# Patient Record
Sex: Female | Born: 1945
Health system: Southern US, Community
[De-identification: ages and names within clinical notes are randomized; demographics above are authoritative.]

## PROBLEM LIST (undated history)

## (undated) DIAGNOSIS — Z85828 Personal history of other malignant neoplasm of skin: Secondary | ICD-10-CM

## (undated) DIAGNOSIS — R5381 Other malaise: Secondary | ICD-10-CM

## (undated) DIAGNOSIS — IMO0001 Reserved for inherently not codable concepts without codable children: Secondary | ICD-10-CM

## (undated) DIAGNOSIS — M899 Disorder of bone, unspecified: Secondary | ICD-10-CM

## (undated) DIAGNOSIS — M810 Age-related osteoporosis without current pathological fracture: Secondary | ICD-10-CM

## (undated) DIAGNOSIS — D649 Anemia, unspecified: Secondary | ICD-10-CM

## (undated) DIAGNOSIS — G901 Familial dysautonomia [Riley-Day]: Secondary | ICD-10-CM

## (undated) DIAGNOSIS — G479 Sleep disorder, unspecified: Secondary | ICD-10-CM

## (undated) DIAGNOSIS — M79609 Pain in unspecified limb: Secondary | ICD-10-CM

## (undated) DIAGNOSIS — C541 Malignant neoplasm of endometrium: Secondary | ICD-10-CM

## (undated) DIAGNOSIS — R51 Headache: Secondary | ICD-10-CM

## (undated) DIAGNOSIS — Z8619 Personal history of other infectious and parasitic diseases: Secondary | ICD-10-CM

## (undated) DIAGNOSIS — L821 Other seborrheic keratosis: Secondary | ICD-10-CM

## (undated) DIAGNOSIS — M949 Disorder of cartilage, unspecified: Secondary | ICD-10-CM

## (undated) DIAGNOSIS — R5383 Other fatigue: Secondary | ICD-10-CM

## (undated) DIAGNOSIS — R4589 Other symptoms and signs involving emotional state: Secondary | ICD-10-CM

## (undated) DIAGNOSIS — IMO0002 Reserved for concepts with insufficient information to code with codable children: Secondary | ICD-10-CM

## (undated) DIAGNOSIS — L408 Other psoriasis: Secondary | ICD-10-CM

## (undated) DIAGNOSIS — K649 Unspecified hemorrhoids: Secondary | ICD-10-CM

## (undated) DIAGNOSIS — L509 Urticaria, unspecified: Secondary | ICD-10-CM

## (undated) DIAGNOSIS — F419 Anxiety disorder, unspecified: Secondary | ICD-10-CM

## (undated) DIAGNOSIS — N6019 Diffuse cystic mastopathy of unspecified breast: Secondary | ICD-10-CM

## (undated) HISTORY — DX: Familial dysautonomia (riley-day): G90.1

## (undated) HISTORY — DX: Other malaise: R53.81

## (undated) HISTORY — PX: KNEE SURGERY: SHX244

## (undated) HISTORY — DX: Disorder of bone, unspecified: M94.9

## (undated) HISTORY — DX: Urticaria, unspecified: L50.9

## (undated) HISTORY — PX: TONSILECTOMY, ADENOIDECTOMY, BILATERAL MYRINGOTOMY AND TUBES: SHX2538

## (undated) HISTORY — DX: Headache: R51

## (undated) HISTORY — PX: FINGER SURGERY: SHX640

## (undated) HISTORY — DX: Unspecified hemorrhoids: K64.9

## (undated) HISTORY — PX: AXILLARY LYMPH NODE DISSECTION: SHX5229

## (undated) HISTORY — DX: Other seborrheic keratosis: L82.1

## (undated) HISTORY — DX: Personal history of other malignant neoplasm of skin: Z85.828

## (undated) HISTORY — PX: TUBAL LIGATION: SHX77

## (undated) HISTORY — DX: Other symptoms and signs involving emotional state: R45.89

## (undated) HISTORY — DX: Personal history of other infectious and parasitic diseases: Z86.19

## (undated) HISTORY — DX: Other psoriasis: L40.8

## (undated) HISTORY — DX: Disorder of bone, unspecified: M89.9

## (undated) HISTORY — DX: Pain in unspecified limb: M79.609

## (undated) HISTORY — PX: OTHER SURGICAL HISTORY: SHX169

## (undated) HISTORY — DX: Reserved for inherently not codable concepts without codable children: IMO0001

## (undated) HISTORY — PX: CERVICAL CONIZATION W/BX: SHX1330

## (undated) HISTORY — PX: BREAST LUMPECTOMY: SHX2

## (undated) HISTORY — DX: Diffuse cystic mastopathy of unspecified breast: N60.19

## (undated) HISTORY — PX: BREAST EXCISIONAL BIOPSY: SUR124

## (undated) HISTORY — DX: Anxiety disorder, unspecified: F41.9

## (undated) HISTORY — DX: Anemia, unspecified: D64.9

## (undated) HISTORY — DX: Sleep disorder, unspecified: G47.9

## (undated) HISTORY — DX: Reserved for concepts with insufficient information to code with codable children: IMO0002

## (undated) HISTORY — DX: Age-related osteoporosis without current pathological fracture: M81.0

## (undated) HISTORY — DX: Other fatigue: R53.83

---

## 1971-07-25 HISTORY — PX: OTHER SURGICAL HISTORY: SHX169

## 1999-07-25 HISTORY — PX: BREAST BIOPSY: SHX20

## 2002-01-15 ENCOUNTER — Encounter: Payer: Self-pay | Admitting: Family Medicine

## 2002-01-15 ENCOUNTER — Encounter: Admission: RE | Admit: 2002-01-15 | Discharge: 2002-01-15 | Payer: Self-pay | Admitting: Family Medicine

## 2002-03-04 ENCOUNTER — Other Ambulatory Visit: Admission: RE | Admit: 2002-03-04 | Discharge: 2002-03-04 | Payer: Self-pay | Admitting: Family Medicine

## 2002-03-24 HISTORY — PX: OTHER SURGICAL HISTORY: SHX169

## 2003-01-20 ENCOUNTER — Encounter: Payer: Self-pay | Admitting: Family Medicine

## 2003-01-20 ENCOUNTER — Encounter: Admission: RE | Admit: 2003-01-20 | Discharge: 2003-01-20 | Payer: Self-pay | Admitting: Family Medicine

## 2003-04-07 ENCOUNTER — Other Ambulatory Visit: Admission: RE | Admit: 2003-04-07 | Discharge: 2003-04-07 | Payer: Self-pay | Admitting: Family Medicine

## 2003-06-24 HISTORY — PX: COLONOSCOPY: SHX174

## 2003-06-25 ENCOUNTER — Encounter: Payer: Self-pay | Admitting: Gastroenterology

## 2003-06-25 LAB — HM COLONOSCOPY

## 2004-02-04 ENCOUNTER — Encounter: Admission: RE | Admit: 2004-02-04 | Discharge: 2004-02-04 | Payer: Self-pay | Admitting: Family Medicine

## 2004-05-13 ENCOUNTER — Other Ambulatory Visit: Admission: RE | Admit: 2004-05-13 | Discharge: 2004-05-13 | Payer: Self-pay | Admitting: Family Medicine

## 2004-06-30 ENCOUNTER — Ambulatory Visit: Payer: Self-pay | Admitting: Family Medicine

## 2004-07-24 HISTORY — PX: OTHER SURGICAL HISTORY: SHX169

## 2004-08-19 ENCOUNTER — Ambulatory Visit: Payer: Self-pay | Admitting: Family Medicine

## 2005-01-04 ENCOUNTER — Ambulatory Visit: Payer: Self-pay | Admitting: Family Medicine

## 2005-02-07 ENCOUNTER — Encounter: Admission: RE | Admit: 2005-02-07 | Discharge: 2005-02-07 | Payer: Self-pay | Admitting: Family Medicine

## 2005-02-13 ENCOUNTER — Encounter: Admission: RE | Admit: 2005-02-13 | Discharge: 2005-02-13 | Payer: Self-pay | Admitting: Family Medicine

## 2005-05-16 ENCOUNTER — Ambulatory Visit: Payer: Self-pay | Admitting: Family Medicine

## 2005-05-16 ENCOUNTER — Other Ambulatory Visit: Admission: RE | Admit: 2005-05-16 | Discharge: 2005-05-16 | Payer: Self-pay | Admitting: Family Medicine

## 2005-05-24 ENCOUNTER — Ambulatory Visit: Payer: Self-pay | Admitting: Family Medicine

## 2005-06-14 ENCOUNTER — Ambulatory Visit: Payer: Self-pay | Admitting: Family Medicine

## 2005-07-16 ENCOUNTER — Encounter: Payer: Self-pay | Admitting: Family Medicine

## 2005-07-16 LAB — CONVERTED CEMR LAB: Pap Smear: NORMAL

## 2005-12-24 ENCOUNTER — Emergency Department (HOSPITAL_COMMUNITY): Admission: EM | Admit: 2005-12-24 | Discharge: 2005-12-24 | Payer: Self-pay | Admitting: Emergency Medicine

## 2005-12-31 ENCOUNTER — Emergency Department (HOSPITAL_COMMUNITY): Admission: EM | Admit: 2005-12-31 | Discharge: 2005-12-31 | Payer: Self-pay | Admitting: Emergency Medicine

## 2006-03-07 ENCOUNTER — Encounter: Admission: RE | Admit: 2006-03-07 | Discharge: 2006-03-07 | Payer: Self-pay | Admitting: Family Medicine

## 2006-05-02 ENCOUNTER — Ambulatory Visit: Payer: Self-pay | Admitting: Family Medicine

## 2006-05-02 ENCOUNTER — Ambulatory Visit: Payer: Self-pay | Admitting: Internal Medicine

## 2006-07-16 ENCOUNTER — Ambulatory Visit: Payer: Self-pay | Admitting: Family Medicine

## 2006-07-16 ENCOUNTER — Other Ambulatory Visit: Admission: RE | Admit: 2006-07-16 | Discharge: 2006-07-16 | Payer: Self-pay | Admitting: Family Medicine

## 2006-07-16 ENCOUNTER — Encounter: Payer: Self-pay | Admitting: Family Medicine

## 2006-07-16 LAB — CONVERTED CEMR LAB
Pap Smear: NORMAL
Pap Smear: NORMAL

## 2006-08-03 ENCOUNTER — Ambulatory Visit: Payer: Self-pay | Admitting: Gastroenterology

## 2006-08-21 ENCOUNTER — Ambulatory Visit: Payer: Self-pay | Admitting: Family Medicine

## 2006-08-21 LAB — CONVERTED CEMR LAB
ALT: 19 units/L (ref 0–40)
AST: 24 units/L (ref 0–37)
Albumin: 3.9 g/dL (ref 3.5–5.2)
BUN: 19 mg/dL (ref 6–23)
Basophils Absolute: 0 10*3/uL (ref 0.0–0.1)
Basophils Relative: 0.6 % (ref 0.0–1.0)
CO2: 28 meq/L (ref 19–32)
Calcium: 9.1 mg/dL (ref 8.4–10.5)
Chloride: 108 meq/L (ref 96–112)
Cholesterol: 185 mg/dL (ref 0–200)
Creatinine, Ser: 0.7 mg/dL (ref 0.4–1.2)
Eosinophils Absolute: 0.1 10*3/uL (ref 0.0–0.6)
Eosinophils Relative: 2.1 % (ref 0.0–5.0)
GFR calc Af Amer: 109 mL/min
GFR calc non Af Amer: 90 mL/min
Glucose, Bld: 91 mg/dL (ref 70–99)
HCT: 36.1 % (ref 36.0–46.0)
HDL: 66.2 mg/dL (ref >39.0)
Hemoglobin: 12.3 g/dL (ref 12.0–15.0)
LDL Cholesterol: 106 mg/dL — ABNORMAL HIGH (ref 0–99)
Lymphocytes Relative: 25.8 % (ref 12.0–46.0)
MCHC: 34 g/dL (ref 30.0–36.0)
MCV: 97.8 fL (ref 78.0–100.0)
Monocytes Absolute: 0.4 10*3/uL (ref 0.2–0.7)
Monocytes Relative: 9.3 % (ref 3.0–11.0)
Neutro Abs: 2.8 10*3/uL (ref 1.4–7.7)
Neutrophils Relative %: 62.2 % (ref 43.0–77.0)
Phosphorus: 3.6 mg/dL (ref 2.3–4.6)
Platelets: 156 10*3/uL (ref 150–400)
Potassium: 4.1 meq/L (ref 3.5–5.1)
RBC: 3.69 M/uL — ABNORMAL LOW (ref 3.87–5.11)
RDW: 11.9 % (ref 11.5–14.6)
Sodium: 141 meq/L (ref 135–145)
TSH: 1.57 microintl units/mL (ref 0.35–5.50)
Total CHOL/HDL Ratio: 2.8
Triglycerides: 63 mg/dL (ref 0–149)
VLDL: 13 mg/dL (ref 0–40)
WBC: 4.5 10*3/uL (ref 4.5–10.5)

## 2006-09-21 ENCOUNTER — Ambulatory Visit: Payer: Self-pay | Admitting: Family Medicine

## 2006-09-21 LAB — CONVERTED CEMR LAB
Basophils Absolute: 0 10*3/uL (ref 0.0–0.1)
Basophils Relative: 1 % (ref 0–1)
Eosinophils Absolute: 0.1 10*3/uL (ref 0.0–0.7)
Eosinophils Relative: 2 % (ref 0–5)
Folate: >20 ng/mL
Free T4: 1.25 ng/dL (ref 0.89–1.80)
Glucose, Bld: 88 mg/dL (ref 70–99)
HCT: 38.1 % (ref 36.0–46.0)
Hemoglobin: 12.5 g/dL (ref 12.0–15.0)
Lymphocytes Relative: 27 % (ref 12–46)
Lymphs Abs: 1.6 10*3/uL (ref 0.7–3.3)
MCHC: 32.8 g/dL (ref 30.0–36.0)
MCV: 97.4 fL (ref 78.0–100.0)
Monocytes Absolute: 0.6 10*3/uL (ref 0.2–0.7)
Monocytes Relative: 10 % (ref 3–11)
Neutro Abs: 3.5 10*3/uL (ref 1.7–7.7)
Neutrophils Relative %: 61 % (ref 43–77)
Platelets: 181 10*3/uL (ref 150–400)
RBC: 3.91 M/uL (ref 3.87–5.11)
RDW: 12.7 % (ref 11.5–14.0)
TSH: 2.028 microintl units/mL (ref 0.350–5.50)
Vit D, 1,25-Dihydroxy: 43 (ref 20–57)
Vitamin B-12: 1978 pg/mL — ABNORMAL HIGH (ref 211–911)
WBC: 5.8 10*3/uL (ref 4.0–10.5)

## 2006-12-04 ENCOUNTER — Telehealth (INDEPENDENT_AMBULATORY_CARE_PROVIDER_SITE_OTHER): Payer: Self-pay | Admitting: *Deleted

## 2007-02-22 HISTORY — PX: OTHER SURGICAL HISTORY: SHX169

## 2007-02-27 ENCOUNTER — Ambulatory Visit: Payer: Self-pay | Admitting: Internal Medicine

## 2007-03-12 ENCOUNTER — Encounter: Admission: RE | Admit: 2007-03-12 | Discharge: 2007-03-12 | Payer: Self-pay | Admitting: Family Medicine

## 2007-03-15 ENCOUNTER — Encounter (INDEPENDENT_AMBULATORY_CARE_PROVIDER_SITE_OTHER): Payer: Self-pay | Admitting: *Deleted

## 2007-05-03 ENCOUNTER — Ambulatory Visit: Payer: Self-pay | Admitting: Family Medicine

## 2007-06-21 ENCOUNTER — Ambulatory Visit: Payer: Self-pay | Admitting: Family Medicine

## 2007-06-21 DIAGNOSIS — C44309 Unspecified malignant neoplasm of skin of other parts of face: Secondary | ICD-10-CM | POA: Insufficient documentation

## 2007-06-21 DIAGNOSIS — L408 Other psoriasis: Secondary | ICD-10-CM | POA: Insufficient documentation

## 2007-06-21 DIAGNOSIS — N6019 Diffuse cystic mastopathy of unspecified breast: Secondary | ICD-10-CM | POA: Insufficient documentation

## 2007-06-21 DIAGNOSIS — C44300 Unspecified malignant neoplasm of skin of unspecified part of face: Secondary | ICD-10-CM | POA: Insufficient documentation

## 2007-06-21 DIAGNOSIS — L509 Urticaria, unspecified: Secondary | ICD-10-CM | POA: Insufficient documentation

## 2007-07-19 ENCOUNTER — Telehealth: Payer: Self-pay | Admitting: Family Medicine

## 2007-09-04 ENCOUNTER — Ambulatory Visit: Payer: Self-pay | Admitting: Family Medicine

## 2007-10-08 ENCOUNTER — Ambulatory Visit: Payer: Self-pay | Admitting: Family Medicine

## 2007-10-08 DIAGNOSIS — M810 Age-related osteoporosis without current pathological fracture: Secondary | ICD-10-CM | POA: Insufficient documentation

## 2007-10-15 ENCOUNTER — Ambulatory Visit: Payer: Self-pay | Admitting: Family Medicine

## 2007-10-17 LAB — CONVERTED CEMR LAB
ALT: 16 units/L (ref 0–35)
AST: 25 units/L (ref 0–37)
Albumin: 3.5 g/dL (ref 3.5–5.2)
Alkaline Phosphatase: 44 units/L (ref 39–117)
BUN: 13 mg/dL (ref 6–23)
Basophils Absolute: 0 10*3/uL (ref 0.0–0.1)
Basophils Relative: 0.6 % (ref 0.0–1.0)
Bilirubin, Direct: 0.1 mg/dL (ref 0.0–0.3)
CO2: 29 meq/L (ref 19–32)
Calcium: 9.1 mg/dL (ref 8.4–10.5)
Chloride: 108 meq/L (ref 96–112)
Cholesterol: 185 mg/dL (ref 0–200)
Creatinine, Ser: 0.6 mg/dL (ref 0.4–1.2)
Eosinophils Absolute: 0.1 10*3/uL (ref 0.0–0.6)
Eosinophils Relative: 3.2 % (ref 0.0–5.0)
GFR calc Af Amer: 130 mL/min
GFR calc non Af Amer: 107 mL/min
Glucose, Bld: 87 mg/dL (ref 70–99)
HCT: 35.4 % — ABNORMAL LOW (ref 36.0–46.0)
HDL: 65.6 mg/dL (ref >39.0)
Hemoglobin: 11.7 g/dL — ABNORMAL LOW (ref 12.0–15.0)
LDL Cholesterol: 110 mg/dL — ABNORMAL HIGH (ref 0–99)
Lymphocytes Relative: 28.7 % (ref 12.0–46.0)
MCHC: 33 g/dL (ref 30.0–36.0)
MCV: 98.1 fL (ref 78.0–100.0)
Monocytes Absolute: 0.4 10*3/uL (ref 0.2–0.7)
Monocytes Relative: 9.9 % (ref 3.0–11.0)
Neutro Abs: 2.7 10*3/uL (ref 1.4–7.7)
Neutrophils Relative %: 57.6 % (ref 43.0–77.0)
Platelets: 161 10*3/uL (ref 150–400)
Potassium: 4.1 meq/L (ref 3.5–5.1)
RBC: 3.61 M/uL — ABNORMAL LOW (ref 3.87–5.11)
RDW: 12.7 % (ref 11.5–14.6)
Sodium: 141 meq/L (ref 135–145)
TSH: 1.77 microintl units/mL (ref 0.35–5.50)
Total Bilirubin: 0.7 mg/dL (ref 0.3–1.2)
Total CHOL/HDL Ratio: 2.8
Total Protein: 6.4 g/dL (ref 6.0–8.3)
Triglycerides: 48 mg/dL (ref 0–149)
VLDL: 10 mg/dL (ref 0–40)
WBC: 4.5 10*3/uL (ref 4.5–10.5)

## 2007-10-25 ENCOUNTER — Ambulatory Visit: Payer: Self-pay | Admitting: Family Medicine

## 2007-10-25 LAB — CONVERTED CEMR LAB
OCCULT 1: NEGATIVE
OCCULT 2: NEGATIVE
OCCULT 3: NEGATIVE

## 2007-10-28 LAB — FECAL OCCULT BLOOD, GUAIAC: Fecal Occult Blood: NEGATIVE

## 2008-03-16 ENCOUNTER — Encounter: Admission: RE | Admit: 2008-03-16 | Discharge: 2008-03-16 | Payer: Self-pay | Admitting: Family Medicine

## 2008-05-01 ENCOUNTER — Telehealth: Payer: Self-pay | Admitting: Family Medicine

## 2008-07-31 ENCOUNTER — Telehealth (INDEPENDENT_AMBULATORY_CARE_PROVIDER_SITE_OTHER): Payer: Self-pay | Admitting: *Deleted

## 2008-10-08 ENCOUNTER — Ambulatory Visit: Payer: Self-pay | Admitting: Family Medicine

## 2008-10-09 LAB — CONVERTED CEMR LAB
ALT: 17 units/L (ref 0–35)
AST: 22 units/L (ref 0–37)
Albumin: 3.5 g/dL (ref 3.5–5.2)
Alkaline Phosphatase: 54 units/L (ref 39–117)
BUN: 16 mg/dL (ref 6–23)
Basophils Absolute: 0 10*3/uL (ref 0.0–0.1)
Basophils Relative: 0.1 % (ref 0.0–3.0)
Bilirubin, Direct: 0 mg/dL (ref 0.0–0.3)
CO2: 30 meq/L (ref 19–32)
Calcium: 8.9 mg/dL (ref 8.4–10.5)
Chloride: 108 meq/L (ref 96–112)
Cholesterol: 186 mg/dL (ref 0–200)
Creatinine, Ser: 0.6 mg/dL (ref 0.4–1.2)
Eosinophils Absolute: 0.1 10*3/uL (ref 0.0–0.7)
Eosinophils Relative: 2.8 % (ref 0.0–5.0)
GFR calc non Af Amer: 106.74 mL/min (ref >60)
Glucose, Bld: 86 mg/dL (ref 70–99)
HCT: 34.4 % — ABNORMAL LOW (ref 36.0–46.0)
HDL: 67 mg/dL (ref >39.00)
Hemoglobin: 11.8 g/dL — ABNORMAL LOW (ref 12.0–15.0)
LDL Cholesterol: 108 mg/dL — ABNORMAL HIGH (ref 0–99)
Lymphocytes Relative: 27.7 % (ref 12.0–46.0)
Lymphs Abs: 1.3 10*3/uL (ref 0.7–4.0)
MCHC: 34.3 g/dL (ref 30.0–36.0)
MCV: 97.8 fL (ref 78.0–100.0)
Monocytes Absolute: 0.4 10*3/uL (ref 0.1–1.0)
Monocytes Relative: 9.2 % (ref 3.0–12.0)
Neutro Abs: 2.8 10*3/uL (ref 1.4–7.7)
Neutrophils Relative %: 60.2 % (ref 43.0–77.0)
Platelets: 133 10*3/uL — ABNORMAL LOW (ref 150.0–400.0)
Potassium: 3.5 meq/L (ref 3.5–5.1)
RBC: 3.51 M/uL — ABNORMAL LOW (ref 3.87–5.11)
RDW: 12.2 % (ref 11.5–14.6)
Sodium: 142 meq/L (ref 135–145)
TSH: 2.18 microintl units/mL (ref 0.35–5.50)
Total Bilirubin: 0.8 mg/dL (ref 0.3–1.2)
Total CHOL/HDL Ratio: 3
Total Protein: 6.4 g/dL (ref 6.0–8.3)
Triglycerides: 56 mg/dL (ref 0.0–149.0)
VLDL: 11.2 mg/dL (ref 0.0–40.0)
WBC: 4.6 10*3/uL (ref 4.5–10.5)

## 2008-10-12 ENCOUNTER — Ambulatory Visit: Payer: Self-pay | Admitting: Family Medicine

## 2008-10-12 ENCOUNTER — Other Ambulatory Visit: Admission: RE | Admit: 2008-10-12 | Discharge: 2008-10-12 | Payer: Self-pay | Admitting: Family Medicine

## 2008-10-12 ENCOUNTER — Encounter: Payer: Self-pay | Admitting: Family Medicine

## 2008-10-12 ENCOUNTER — Telehealth: Payer: Self-pay | Admitting: Gastroenterology

## 2008-10-12 DIAGNOSIS — K649 Unspecified hemorrhoids: Secondary | ICD-10-CM | POA: Insufficient documentation

## 2008-10-12 LAB — CONVERTED CEMR LAB: Vit D, 25-Hydroxy: 42 ng/mL (ref 30–89)

## 2008-10-15 ENCOUNTER — Ambulatory Visit: Payer: Self-pay | Admitting: Gastroenterology

## 2008-10-15 ENCOUNTER — Encounter (INDEPENDENT_AMBULATORY_CARE_PROVIDER_SITE_OTHER): Payer: Self-pay | Admitting: *Deleted

## 2008-11-02 ENCOUNTER — Telehealth (INDEPENDENT_AMBULATORY_CARE_PROVIDER_SITE_OTHER): Payer: Self-pay | Admitting: *Deleted

## 2008-12-09 ENCOUNTER — Ambulatory Visit: Payer: Self-pay | Admitting: Family Medicine

## 2008-12-09 DIAGNOSIS — G479 Sleep disorder, unspecified: Secondary | ICD-10-CM | POA: Insufficient documentation

## 2008-12-09 DIAGNOSIS — R4589 Other symptoms and signs involving emotional state: Secondary | ICD-10-CM | POA: Insufficient documentation

## 2009-03-24 ENCOUNTER — Encounter: Admission: RE | Admit: 2009-03-24 | Discharge: 2009-03-24 | Payer: Self-pay | Admitting: Family Medicine

## 2009-03-24 HISTORY — PX: OTHER SURGICAL HISTORY: SHX169

## 2009-03-26 ENCOUNTER — Encounter (INDEPENDENT_AMBULATORY_CARE_PROVIDER_SITE_OTHER): Payer: Self-pay | Admitting: *Deleted

## 2009-03-31 ENCOUNTER — Encounter: Payer: Self-pay | Admitting: Family Medicine

## 2009-03-31 ENCOUNTER — Ambulatory Visit: Payer: Self-pay | Admitting: Internal Medicine

## 2009-05-26 ENCOUNTER — Ambulatory Visit: Payer: Self-pay | Admitting: Family Medicine

## 2009-08-25 ENCOUNTER — Ambulatory Visit: Payer: Self-pay | Admitting: Family Medicine

## 2009-08-25 DIAGNOSIS — L821 Other seborrheic keratosis: Secondary | ICD-10-CM | POA: Insufficient documentation

## 2009-12-21 ENCOUNTER — Ambulatory Visit: Payer: Self-pay | Admitting: Family Medicine

## 2009-12-21 LAB — CONVERTED CEMR LAB
ALT: 17 units/L (ref 0–35)
AST: 22 units/L (ref 0–37)
Albumin: 3.8 g/dL (ref 3.5–5.2)
Alkaline Phosphatase: 54 units/L (ref 39–117)
BUN: 20 mg/dL (ref 6–23)
Basophils Absolute: 0 10*3/uL (ref 0.0–0.1)
Basophils Relative: 0.6 % (ref 0.0–3.0)
Bilirubin, Direct: 0.1 mg/dL (ref 0.0–0.3)
CO2: 30 meq/L (ref 19–32)
Calcium: 9 mg/dL (ref 8.4–10.5)
Chloride: 104 meq/L (ref 96–112)
Cholesterol: 185 mg/dL (ref 0–200)
Creatinine, Ser: 0.6 mg/dL (ref 0.4–1.2)
Eosinophils Absolute: 0.1 10*3/uL (ref 0.0–0.7)
Eosinophils Relative: 3.2 % (ref 0.0–5.0)
GFR calc non Af Amer: 104.33 mL/min (ref >60)
Glucose, Bld: 84 mg/dL (ref 70–99)
HCT: 34.8 % — ABNORMAL LOW (ref 36.0–46.0)
HDL: 66.5 mg/dL (ref >39.00)
Hemoglobin: 12 g/dL (ref 12.0–15.0)
LDL Cholesterol: 109 mg/dL — ABNORMAL HIGH (ref 0–99)
Lymphocytes Relative: 30.6 % (ref 12.0–46.0)
Lymphs Abs: 1.2 10*3/uL (ref 0.7–4.0)
MCHC: 34.6 g/dL (ref 30.0–36.0)
MCV: 98.7 fL (ref 78.0–100.0)
Monocytes Absolute: 0.4 10*3/uL (ref 0.1–1.0)
Monocytes Relative: 9.5 % (ref 3.0–12.0)
Neutro Abs: 2.2 10*3/uL (ref 1.4–7.7)
Neutrophils Relative %: 56.1 % (ref 43.0–77.0)
Platelets: 139 10*3/uL — ABNORMAL LOW (ref 150.0–400.0)
Potassium: 4.2 meq/L (ref 3.5–5.1)
RBC: 3.52 M/uL — ABNORMAL LOW (ref 3.87–5.11)
RDW: 12.8 % (ref 11.5–14.6)
Sodium: 141 meq/L (ref 135–145)
TSH: 1.9 microintl units/mL (ref 0.35–5.50)
Total Bilirubin: 0.5 mg/dL (ref 0.3–1.2)
Total CHOL/HDL Ratio: 3
Total Protein: 6.3 g/dL (ref 6.0–8.3)
Triglycerides: 50 mg/dL (ref 0.0–149.0)
VLDL: 10 mg/dL (ref 0.0–40.0)
WBC: 4 10*3/uL — ABNORMAL LOW (ref 4.5–10.5)

## 2009-12-22 LAB — CONVERTED CEMR LAB: Vit D, 25-Hydroxy: 45 ng/mL (ref 30–89)

## 2009-12-24 ENCOUNTER — Telehealth: Payer: Self-pay | Admitting: Family Medicine

## 2009-12-29 ENCOUNTER — Ambulatory Visit: Payer: Self-pay | Admitting: Family Medicine

## 2009-12-29 ENCOUNTER — Other Ambulatory Visit: Admission: RE | Admit: 2009-12-29 | Discharge: 2009-12-29 | Payer: Self-pay | Admitting: Family Medicine

## 2009-12-29 DIAGNOSIS — M79609 Pain in unspecified limb: Secondary | ICD-10-CM | POA: Insufficient documentation

## 2009-12-29 LAB — HM PAP SMEAR

## 2009-12-29 LAB — CONVERTED CEMR LAB: Pap Smear: NORMAL

## 2010-01-07 ENCOUNTER — Encounter: Payer: Self-pay | Admitting: Family Medicine

## 2010-01-07 LAB — CONVERTED CEMR LAB: Pap Smear: NEGATIVE

## 2010-02-14 ENCOUNTER — Telehealth: Payer: Self-pay | Admitting: Family Medicine

## 2010-02-17 ENCOUNTER — Ambulatory Visit: Payer: Self-pay | Admitting: Family Medicine

## 2010-02-17 DIAGNOSIS — R519 Headache, unspecified: Secondary | ICD-10-CM | POA: Insufficient documentation

## 2010-02-17 DIAGNOSIS — R5383 Other fatigue: Secondary | ICD-10-CM

## 2010-02-17 DIAGNOSIS — R51 Headache: Secondary | ICD-10-CM | POA: Insufficient documentation

## 2010-02-17 DIAGNOSIS — R5381 Other malaise: Secondary | ICD-10-CM | POA: Insufficient documentation

## 2010-02-18 ENCOUNTER — Telehealth: Payer: Self-pay | Admitting: Family Medicine

## 2010-02-18 LAB — CONVERTED CEMR LAB
BUN: 17 mg/dL (ref 6–23)
Basophils Absolute: 0 10*3/uL (ref 0.0–0.1)
Basophils Relative: 0.5 % (ref 0.0–3.0)
CO2: 30 meq/L (ref 19–32)
Calcium: 8.8 mg/dL (ref 8.4–10.5)
Chloride: 97 meq/L (ref 96–112)
Creatinine, Ser: 0.6 mg/dL (ref 0.4–1.2)
Eosinophils Absolute: 0.1 10*3/uL (ref 0.0–0.7)
Eosinophils Relative: 1.9 % (ref 0.0–5.0)
GFR calc non Af Amer: 115.1 mL/min (ref >60)
Glucose, Bld: 102 mg/dL — ABNORMAL HIGH (ref 70–99)
HCT: 34.7 % — ABNORMAL LOW (ref 36.0–46.0)
Hemoglobin: 12.3 g/dL (ref 12.0–15.0)
Lymphocytes Relative: 35.4 % (ref 12.0–46.0)
Lymphs Abs: 1.2 10*3/uL (ref 0.7–4.0)
MCHC: 35.3 g/dL (ref 30.0–36.0)
MCV: 97.3 fL (ref 78.0–100.0)
Monocytes Absolute: 0.5 10*3/uL (ref 0.1–1.0)
Monocytes Relative: 13.8 % — ABNORMAL HIGH (ref 3.0–12.0)
Neutro Abs: 1.7 10*3/uL (ref 1.4–7.7)
Neutrophils Relative %: 48.4 % (ref 43.0–77.0)
Platelets: 138 10*3/uL — ABNORMAL LOW (ref 150.0–400.0)
Potassium: 3.9 meq/L (ref 3.5–5.1)
RBC: 3.57 M/uL — ABNORMAL LOW (ref 3.87–5.11)
RDW: 13 % (ref 11.5–14.6)
Sed Rate: 25 mm/hr — ABNORMAL HIGH (ref 0–22)
Sodium: 140 meq/L (ref 135–145)
TSH: 1.57 microintl units/mL (ref 0.35–5.50)
WBC: 3.5 10*3/uL — ABNORMAL LOW (ref 4.5–10.5)

## 2010-02-23 ENCOUNTER — Telehealth: Payer: Self-pay | Admitting: Family Medicine

## 2010-04-04 ENCOUNTER — Encounter: Payer: Self-pay | Admitting: Family Medicine

## 2010-04-28 ENCOUNTER — Ambulatory Visit: Payer: Self-pay | Admitting: Family Medicine

## 2010-05-04 ENCOUNTER — Encounter: Admission: RE | Admit: 2010-05-04 | Discharge: 2010-05-04 | Payer: Self-pay | Admitting: Family Medicine

## 2010-05-04 LAB — HM MAMMOGRAPHY: HM Mammogram: NEGATIVE

## 2010-05-09 ENCOUNTER — Encounter (INDEPENDENT_AMBULATORY_CARE_PROVIDER_SITE_OTHER): Payer: Self-pay | Admitting: *Deleted

## 2010-06-27 ENCOUNTER — Telehealth: Payer: Self-pay | Admitting: Family Medicine

## 2010-08-05 ENCOUNTER — Ambulatory Visit
Admission: RE | Admit: 2010-08-05 | Discharge: 2010-08-05 | Payer: Self-pay | Source: Home / Self Care | Attending: Family Medicine | Admitting: Family Medicine

## 2010-08-14 ENCOUNTER — Encounter: Payer: Self-pay | Admitting: Family Medicine

## 2010-08-23 NOTE — Progress Notes (Signed)
Summary: pain in head  Phone Note Call from Patient Call back at Home Phone [phone]   Caller: Patient Call For: Judith Part MD Summary of Call: Pt states she no longer has pain in her temple but this morning her whole head was hurting- a lot of pressure and a lot of tenderness at the base of her skull.  She took advil and she feels a lot better.  She is wondering what could have caused this. Initial call taken by: Lowella Petties CMA,  February 18, 2010 8:30 AM  Follow-up for Phone Call        do not know if it was viral related or possibly even migraine ?  is she interested in possibly seeing a headache specialist/ neurologist ?  how is the fever/ other symptoms?  Follow-up by: Judith Part MD,  February 18, 2010 8:38 AM  Additional Follow-up for Phone Call Additional follow up Details #1::        Patient says that fever is gone, she feels very tired and drained. She says that right now the advil is helping some. She wants to wait it out a little longer before seeing a headache specialist. She will call and let you know if she decides to.   thanks- please keep me updated  Additional Follow-up by: Melody Comas,  February 18, 2010 9:47 AM    Additional Follow-up for Phone Call Additional follow up Details #2::    Patient notified as instructed by telephone. Pt will call first of next week to let Dr Milinda Antis know how she is feeling.Lewanda Rife LPN  February 18, 2010 12:13 PM

## 2010-08-23 NOTE — Assessment & Plan Note (Signed)
Summary: RIGHT WRIST PAIN/RBH   Vital Signs:  Patient profile:   65 year old female Weight:      108 pounds Temp:     98.8 degrees F oral Pulse rate:   72 / minute Pulse rhythm:   regular BP sitting:   120 / 78  (left arm) Cuff size:   regular  Vitals Entered By: Lowella Petties CMA (August 25, 2009 1:13 PM) CC: Right wrist pain, check mole on left shoulder- itches   History of Present Illness: thinks she may have some carpal tunnel -- for about a month  R wrist is hurting on the top and bottom  it hurts and cracks and is stiff at times  is worse at night lying in bed (much worse to lie on it) aches steadily , at times a little sharp pain shoots through with certain positions  is painful to lift with palm up  at times it feels like it gets stuck-- then pain after she cracks it into position     a lot of writing and keyboarding  no more than usual  this has never bothered her before  no discomfort while doing those things or playing the piano   no numbess or tingling   thinks she has a bit of arthritis in thumb for a long time   has a spot on her shoulder to check as well  is itching more  derm told her it is ok   ? how long she has been on evista   Allergies: No Known Drug Allergies  Past History:  Past Medical History: Last updated: 31-Oct-2008 hx of cervical conization fibrocystic breasts osteopenia hx of shingles  psoriasis  hemorroids  derm  Anxiety Disorder MIld dysautonomia Anemia  Past Surgical History: Last updated: 04/14/2009 Cervical cancer (1973) Left breast biopsy- benign (2001) Tubal ligation Knee surgery-right Dexa- osteopenia (03/2002) Colonoscopy-int hemorrhoids (06/2003) Dexa- decreased BMD, osteopenia (07/2004) Ext hemorrhoids with fibrotic area (07/2006) Dexa- osteopenia (02/2007) lumpectomy left underarm birth mark removal as a child dexa 9/10 osteopenia slt worse  Family History: Last updated: 2008/10/31 Father:  deceased age 61, multiple myeloma, CAD  Mother: well, TIAs 1 Maunt with lupus M uncle with prostate ca  MGF died in 44s of cancer  Siblings: brother died with mult sclerosis age 65, sister-well Family History of Breast Cancer: Aunt x 2  Social History: Last updated: 10-31-2008 Marital Status: Married Children: 3  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 -stopped at age 53 Alcohol Use - yes-1 glass daily Daily Caffeine Use-2 cups daily Illicit Drug Use - no  Risk Factors: Smoking Status: quit (10/31/08)  Review of Systems General:  Denies fatigue, fever, loss of appetite, and malaise. Eyes:  Denies blurring and eye irritation. CV:  Denies chest pain or discomfort and palpitations. Resp:  Denies cough. GI:  Denies indigestion. MS:  Complains of joint pain and stiffness; denies joint redness, joint swelling, cramps, and muscle weakness. Derm:  Complains of lesion(s); denies itching, poor wound healing, and rash. Neuro:  Denies numbness, tingling, and weakness. Heme:  Denies abnormal bruising and bleeding.  Physical Exam  General:  Well-developed,well-nourished,in no acute distress; alert,appropriate and cooperative throughout examination Head:  normocephalic, atraumatic, and no abnormalities observed.   Eyes:  vision grossly intact, pupils equal, pupils round, and pupils reactive to light.   Mouth:  pharynx pink and moist.   Neck:  supple with full rom and  no masses or thyromegally, no JVD or carotid bruit  Chest Wall:  No deformities, masses, or tenderness noted. Heart:  Normal rate and regular rhythm. S1 and S2 normal without gallop, murmur, click, rub or other extra sounds. Msk:  R wrist = mild medial tenderness with pain to fully flex no swelling or skin change  neg finklestien/tinel/phalen nl rom and perfusion occ crepitice  distal forarm mildly tender no elbow tenderness  Pulses:  R and L  carotid,radial,femoral,dorsalis pedis and posterior tibial pulses are full and equal bilaterally Neurologic:  strength normal in all extremities, sensation intact to light touch, gait normal, and DTRs symmetrical and normal.  (nl grip bilat) Skin:  small SK on L chest adj to shoulder - is irritated  Cervical Nodes:  No lymphadenopathy noted Psych:  normal affect, talkative and pleasant    Impression & Recommendations:  Problem # 1:  WRIST PAIN, RIGHT (ICD-719.43) Assessment New suspect tendonitis  no neurol signs of carpal tunnel  will wear wrist splint and ice / relative rest mobic for 1-2 wk if not imp - consider hand orto ref - will update  Problem # 2:  SEBORRHEIC KERATOSIS (ICD-702.19) Assessment: New re-assured not malignant  will obs if inc irritaion- consider cryotx  Complete Medication List: 1)  Evista 60 Mg Tabs (Raloxifene hcl) .... One by mouth once daily 2)  Folbic 2.5-25-2 Mg Tabs (Fa-pyridoxine-cyancobalamin) .Marland Kitchen.. 1 by mouth once daily 3)  Fish Oil  .... Take by mouth as directed 4)  Vit E  .... Take by mouth as directed 5)  Vit C  .... Take by mouth as directed 6)  Calcium  .... Take by mouth as directed 7)  B Complex  .... Take by mouth as directed 8)  Multi Vit  .... Take by mouth as directed 9)  Acidophilus  .... Take by mouth as directed 10)  Vitamin D 1000 Unit Tabs (Cholecalciferol) .... Take 1 tablet by mouth once a day 11)  Resveratrol  .... Take 1 tablet by mouth once a day 12)  Biotin  .... Take 1 tablet by mouth once a day 13)  Mobic 15 Mg Tabs (Meloxicam) .Marland Kitchen.. 1 by mouth once daily with full stomach for 14 days for hand pain  Patient Instructions: 1)  take mobic for hand pain daily with food for 2 weeks 2)  get a wrist splint ( for carpal tunnel ) - at the drugstore- wear as much as you are comfortable with  3)  use ice pack several times per day for 10 minutes on tender area  4)  if your pain does not resolve in 2 weeks- call and I will refer you  to a hand specialist  5)  I sent your px to mobic  Prescriptions: MOBIC 15 MG TABS (MELOXICAM) 1 by mouth once daily with full stomach for 14 days for hand pain  #30 x 0   Entered and Authorized by:   Judith Part MD   Signed by:   Judith Part MD on 08/25/2009   Method used:   Electronically to        [facility]* (retail)       [address]       [place], [place]  [address]       Ph: [phone]       Fax: [phone]   RxID:   [id number]

## 2010-08-23 NOTE — Letter (Signed)
Summary: Results Follow up Letter  [facility]  [address] [place], [place] [address]   Phone: [phone]  Fax: [phone]    01/07/2010 MRN: [id number]    [facility] [address] [place], [place]  [address]    Dear Ms. Bubeck,  The following are the results of your recent test(s):  Test         Result    Pap Smear:        Normal __X___  Not Normal _____ Comments: ______________________________________________________ Cholesterol: LDL(Bad cholesterol):         Your goal is less than:         HDL (Good cholesterol):       Your goal is more than: Comments:  ______________________________________________________ Mammogram:        Normal _____  Not Normal _____ Comments:  ___________________________________________________________________ Hemoccult:        Normal _____  Not normal _______ Comments:    _____________________________________________________________________ Other Tests:    We routinely do not discuss normal results over the telephone.  If you desire a copy of the results, or you have any questions about this information we can discuss them at your next office visit.   Sincerely,    Idamae Schuller Tower,MD  MT/ri

## 2010-08-23 NOTE — Miscellaneous (Signed)
Summary: pap screening   Clinical Lists Changes  Observations: Added new observation of PAP SMEAR: normal (12/29/2009 15:18)      Preventive Care Screening  Pap Smear:    Date:  12/29/2009    Results:  normal

## 2010-08-23 NOTE — Assessment & Plan Note (Signed)
Summary: FLU SHOT/CLE  Nurse Visit   Allergies: No Known Drug Allergies  Orders Added: 1)  Admin 1st Vaccine [90471] 2)  Flu Vaccine 3yrs + [90658]  Flu Vaccine Consent Questions     Do you have a history of severe allergic reactions to this vaccine? no    Any prior history of allergic reactions to egg and/or gelatin? no    Do you have a sensitivity to the preservative Thimersol? no    Do you have a past history of Guillan-Barre Syndrome? no    Do you currently have an acute febrile illness? no    Have you ever had a severe reaction to latex? no    Vaccine information given and explained to patient? yes    Are you currently pregnant? no    Lot Number:[id number]   Exp Date:01/21/2011   Site Given  Left Deltoid IMu  

## 2010-08-23 NOTE — Letter (Signed)
Summary: Results Follow up Letter  [facility]  [address] [place], [place] [address]   Phone: [phone]  Fax: [phone]    05/09/2010 MRN: [id number]     [facility] [address] [place], [place]  [address]    Dear Ms. Dobek,  The following are the results of your recent test(s):  Test         Result    Pap Smear:        Normal _____  Not Normal _____ Comments: ______________________________________________________ Cholesterol: LDL(Bad cholesterol):         Your goal is less than:         HDL (Good cholesterol):       Your goal is more than: Comments:  ______________________________________________________ Mammogram:        Normal __x___  Not Normal _____ Comments:Repeat in 1 year  ___________________________________________________________________ Hemoccult:        Normal _____  Not normal _______ Comments:    _____________________________________________________________________ Other Tests:    We routinely do not discuss normal results over the telephone.  If you desire a copy of the results, or you have any questions about this information we can discuss them at your next office visit.   Sincerely,  Roxy Manns MD

## 2010-08-23 NOTE — Progress Notes (Signed)
Summary: pt doing much better  Phone Note Call from Patient   Caller: Patient Call For: Judith Part MD Summary of Call: Pt was to call today to let you know how she is doing, she says she is much better.  Headaches are gone, eye pain is gone. Initial call taken by: Lowella Petties CMA,  February 23, 2010 10:00 AM  Follow-up for Phone Call        I'm glad, thanks for the update  let me know if symptoms return Follow-up by: Judith Part MD,  February 23, 2010 4:11 PM  Additional Follow-up for Phone Call Additional follow up Details #1::        Patient's husband notified as instructed by telephone. Lewanda Rife LPN  February 23, 2010 4:42 PM

## 2010-08-23 NOTE — Progress Notes (Signed)
Summary: ?about multi vit  Phone Note Call from Patient Call back at Home Phone [phone]   Caller: Patient Call For: Judith Part MD Summary of Call: Patient is asking what multi vitamin you would recommend her to take. She says that her husband bought her the cintrum women's health plus. She is asking if this would be a good choice?  Initial call taken by: Melody Comas,  June 27, 2010 3:24 PM  Follow-up for Phone Call        that is perfectly fine (that brand) in fact- if her diet is very balanced with all the food groups studies show you do not need a mvi at all -- is up to her Follow-up by: Judith Part MD,  June 27, 2010 4:08 PM  Additional Follow-up for Phone Call Additional follow up Details #1::        Patient notified as instructed by telephone. Lewanda Rife LPN  June 27, 2010 4:35 PM

## 2010-08-23 NOTE — Progress Notes (Signed)
Summary: Swollen, itchy eye  Phone Note Call from Patient Call back at [facility] Phone [phone] Call back at Work Phone [phone]   Caller: Patient Call For: Judith Part MD Summary of Call: Patient says her left eye is itchy and red and a bit sore to touch.  Started last evening.  It is not real bad but rather irritating.  They are leaving to go out of town this afternoon.  Is there anything OTC that she could use?  She hasn't had allergies in the past but wonders if this could be something like that. Initial call taken by: Delilah Shan CMA Duncan Dull),  December 24, 2009 10:28 AM  Follow-up for Phone Call        I would recommend just artificial tears - nothing else  cool compress to closed eye - and wipe away any discharge with cool cloth  if worse --seek care at urgent care where she is - esp if vision change or pain or fever Follow-up by: Judith Part MD,  December 24, 2009 12:19 PM  Additional Follow-up for Phone Call Additional follow up Details #1::        Patient notified as instructed by telephone. Lewanda Rife LPN  December 24, 6960 1:05 PM

## 2010-08-23 NOTE — Assessment & Plan Note (Signed)
Summary: CHECK UP/CLE   Vital Signs:  Patient profile:   65 year old female Height:      62.5 inches Weight:      108 pounds BMI:     19.51 Temp:     99.4 degrees F oral Pulse rate:   72 / minute Pulse rhythm:   regular BP sitting:   124 / 72  (left arm) Cuff size:   regular  Vitals Entered By: Lewanda Rife LPN (December 29, 1608 2:34 PM) CC: check up LMP 8 yrs ago   History of Present Illness: here for check up of chronic med problems and to review health mt list   has been feeling so so-- end of semester blah rough year -- mother was sick and tough school  is glad to have a small break is supposed to go to [place] next week -- may decide not to go    wt is stable with bmi of 19  bp is 124/72  hx of cervical conization/ ca cells  nl pap last year 3/10 continue to come back normal   osteopenia 910 was slt worse on evista due dexa this fall  takes ca and vit D vit D level is 45- ok   lipids good with LDL of 109 and high HDL of 66  cbc is stable - mildly low platelet 139-- close to last check  Td 03   colonosc was 12/04  mam 9/10 self exam -no lumps or problems   not interested in pneumovax   is having problems with her toe L foot -- 5th toe has stubbed it quite a few times  she buddy tapes it to other toes  cannot wear any shoes due to the pain  keeps injuring it -then uses ice and nsaid otc --now is staying swollen  this makes it difficult to walk- and this aggrivates her back    Allergies (verified): No Known Drug Allergies  Past History:  Past Medical History: Last updated: 10-17-2008 hx of cervical conization fibrocystic breasts osteopenia hx of shingles  psoriasis  hemorroids  derm  Anxiety Disorder MIld dysautonomia Anemia  Past Surgical History: Last updated: 04/14/2009 Cervical cancer (1973) Left breast biopsy- benign (2001) Tubal ligation Knee surgery-right Dexa- osteopenia (03/2002) Colonoscopy-int hemorrhoids (06/2003) Dexa-  decreased BMD, osteopenia (07/2004) Ext hemorrhoids with fibrotic area (07/2006) Dexa- osteopenia (02/2007) lumpectomy left underarm birth mark removal as a child dexa 9/10 osteopenia slt worse  Family History: Last updated: 10/17/2008 Father: deceased age 83, multiple myeloma, CAD  Mother: well, TIAs 1 Maunt with lupus M uncle with prostate ca  MGF died in 64s of cancer  Siblings: brother died with mult sclerosis age 74, sister-well Family History of Breast Cancer: Aunt x 2  Social History: Last updated: 10-17-2008 Marital Status: Married Children: 3  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 -stopped at age 46 Alcohol Use - yes-1 glass daily Daily Caffeine Use-2 cups daily Illicit Drug Use - no  Risk Factors: Smoking Status: quit (10/17/2008)  Review of Systems General:  Complains of fatigue; denies fever, loss of appetite, and malaise. Eyes:  Denies blurring and eye irritation. CV:  Denies chest pain or discomfort, palpitations, shortness of breath with exertion, and swelling of feet. Resp:  Denies cough, shortness of breath, and wheezing. GI:  Denies abdominal pain, bloody stools, change in bowel habits, indigestion, nausea, and vomiting. GU:  Denies dysuria and urinary frequency. MS:  Denies joint pain, joint redness, joint swelling, and muscle weakness. Derm:  Denies itching, lesion(s), poor wound healing, and rash. Neuro:  Denies numbness and tingling. Psych:  Denies anxiety and depression. Endo:  Denies cold intolerance, excessive thirst, excessive urination, and heat intolerance. Heme:  Denies abnormal bruising and bleeding.  Physical Exam  General:  Well-developed,well-nourished,in no acute distress; alert,appropriate and cooperative throughout examination Head:  normocephalic, atraumatic, and no abnormalities observed.   Eyes:  vision grossly intact, pupils equal, pupils round, and pupils  reactive to light.  no conjunctival pallor, injection or icterus  Ears:  R ear normal and L ear normal.   Nose:  no nasal discharge.   Mouth:  pharynx pink and moist.   Neck:  supple with full rom and no masses or thyromegally, no JVD or carotid bruit  Chest Wall:  No deformities, masses, or tenderness noted. Breasts:  No mass, nodules, thickening, tenderness, bulging, retraction, inflamation, nipple discharge or skin changes noted.   Lungs:  CTA with good air exch Heart:  Normal rate and regular rhythm. S1 and S2 normal without gallop, murmur, click, rub or other extra sounds. Abdomen:  Bowel sounds positive,abdomen soft and non-tender without masses, organomegaly or hernias noted. no renal bruits  Genitalia:  Normal introitus for age, no external lesions, no vaginal discharge, mucosa pink and moist, no vaginal or cervical lesions, no vaginal atrophy, no friaility or hemorrhage, normal uterus size and position, no adnexal masses or tenderness Msk:  L 5th toe is mildly swollen-no redness or heat  some tenderness to palpation and flexion no other joint changes  Pulses:  R and L carotid,radial,femoral,dorsalis pedis and posterior tibial pulses are full and equal bilaterally Extremities:  No clubbing, cyanosis, edema, or deformity noted with normal full range of motion of all joints.   Neurologic:  sensation intact to light touch, gait normal, and DTRs symmetrical and normal.   Skin:  Intact without suspicious lesions or rashes Cervical Nodes:  No lymphadenopathy noted Axillary Nodes:  No palpable lymphadenopathy Inguinal Nodes:  No significant adenopathy Psych:  seems fatigued but talkative and pleasant    Impression & Recommendations:  Problem # 1:  OSTEOPENIA (ICD-733.90) on evista for over 5 y will get dexa in fall and decide whether to continue it  continue ca and D and exercise  Her updated medication list for this problem includes:    Evista 60 Mg Tabs (Raloxifene hcl) ..... One by  mouth once daily    Vitamin D 1000 Unit Tabs (Cholecalciferol) .Marland Kitchen... Take 1 tablet by mouth once a day  Orders: Radiology Referral (Radiology)  Problem # 2:  FIBROCYSTIC BREAST DISEASE (ICD-610.1) breast exam done today  is due mam in fall   Problem # 3:  STRESS REACTION, ACUTE, WITH EMOTIONAL DISTURBANCE (ICD-308.0) exhaustion from hard semester starting to do better  agree with plan to relax with less planning this summer   Problem # 4:  ROUTINE GYNECOLOGICAL EXAMINATION (ICD-V72.31) pap/ pelvic done in pt with hx of cervical cancer and conization in the past no symptoms   Problem # 5:  TOE PAIN (ICD-729.5) Assessment: New L 5th toe pain after many incidences of trauma - ? fractures  suspect OA - less likely neuroma or other process may need orthotic for shoes  ref to Dr Patsy Lager for eval   Complete Medication List: 1)  Evista 60 Mg Tabs (Raloxifene hcl) .... One by mouth once daily 2)  Folbic 2.5-25-2 Mg Tabs (Fa-pyridoxine-cyancobalamin) .Marland Kitchen.. 1 by mouth once  daily 3)  Fish Oil  .... Take by mouth as directed 4)  Vit E  .... Take by mouth as directed 5)  Vit C  .... Take by mouth as directed 6)  Calcium  .... Take by mouth as directed 7)  B Complex  .... Take by mouth as directed 8)  Multi Vit  .... Take by mouth as directed 9)  Acidophilus  .... Take by mouth as directed 10)  Vitamin D 1000 Unit Tabs (Cholecalciferol) .... Take 1 tablet by mouth once a day 11)  Resveratrol  .... Take 1 tablet by mouth once a day 12)  Biotin  .... Take 1 tablet by mouth once a day 13)  Advil 200 Mg Tabs (Ibuprofen) .... Otc as directed.  Patient Instructions: 1)  check with your insurance to see if shingles vaccine is covered  2)  call us in 2 months to schedule -- to see if we have a supply  3)  we will schedule appt with Dr Patsy Lager for toe pain 4)  we will schedule dexa at check out  5)  keep working on healthy diet and exercise  Current Allergies (reviewed today): No known  allergies

## 2010-08-23 NOTE — Progress Notes (Signed)
Summary: pt not feeling well  Phone Note Call from Patient Call back at Home Phone [phone]   Caller: Patient Call For: Judith Part MD Summary of Call: Pt called to report that she is feeling exhausted, temp around 99.9, feels lousy.  Soreness around right eye, general all over body aches.  Advised pt to rest, drink plenty of fluids, tylenol.  Call back in a couple of days if not better. Initial call taken by: Lowella Petties CMA,  February 14, 2010 12:40 PM  Follow-up for Phone Call        agree with above -also watch for any rash on face and keep me updated f/u in several days if not improving or if worse or symptoms change Follow-up by: Judith Part MD,  February 14, 2010 1:31 PM  Additional Follow-up for Phone Call Additional follow up Details #1::        Spoke with patient and let her know that Dr. Milinda Antis agreed with Jacki Cones. She says that she will call if no improvement after a couple of days.  Additional Follow-up by: Melody Comas,  February 14, 2010 1:34 PM

## 2010-08-23 NOTE — Assessment & Plan Note (Signed)
Summary: still not feeling well/dlo   Vital Signs:  Patient profile:   65 year old female Height:      62.5 inches Weight:      107.75 pounds BMI:     19.46 Temp:     98.5 degrees F oral Pulse rate:   80 / minute Pulse rhythm:   regular BP sitting:   110 / 70  (left arm) Cuff size:   regular  Vitals Entered By: Lewanda Rife LPN (February 17, 2010 11:04 AM) CC: Tired, fever stopped 02/15/10, genreal body aches, h/a for 3 days, sore in nose, not sleeping and starting to feel depressed.   History of Present Illness: last week felt fine - high energy and exercise -- all good  had busy week with some family events this weekend  sat afternoon felt very cold and chilled , and after dinner felt exhausted  then felt like her R eye was hurting (this happened once before and it went away) thought it was a headache from wine slept a long time that night  then next day woke up feeling exhausted and continued chills  sat around on sunday- then got the shakes and felt terrible  took temp was over 101 -- took tylenol otc  no appetite  eye was worse  monday felt unable to drive because she felt so bad - that day she called  fever again in afternoon - over 100  tried to increase her fluids  then started sleepig poorly  headache came and went - unsure if a migraine (helps to lie down and close her eyes and not do anything ) - uses tiger balm and occ advil for migraines and also eating   stomach felt weird -? from advil  used compresses on her eye  last night had eased   today headaches is on both sides temples and top of head  slept poorly- aweful stress dreams  feels a little depressed   stressed because she is working on a paper and traveling    she did end up going to [place] and had a great time   also had sore cuticule on R middle finger that is better now after salt water soaks   Allergies (verified): No Known Drug Allergies  Past History:  Past Medical History: Last updated:  10/25/08 hx of cervical conization fibrocystic breasts osteopenia hx of shingles  psoriasis  hemorroids  derm  Anxiety Disorder MIld dysautonomia Anemia  Past Surgical History: Last updated: 04/14/2009 Cervical cancer (1973) Left breast biopsy- benign (2001) Tubal ligation Knee surgery-right Dexa- osteopenia (03/2002) Colonoscopy-int hemorrhoids (06/2003) Dexa- decreased BMD, osteopenia (07/2004) Ext hemorrhoids with fibrotic area (07/2006) Dexa- osteopenia (02/2007) lumpectomy left underarm birth mark removal as a child dexa 9/10 osteopenia slt worse  Family History: Last updated: 10/25/08 Father: deceased age 25, multiple myeloma, CAD  Mother: well, TIAs 1 Maunt with lupus M uncle with prostate ca  MGF died in 20s of cancer  Siblings: brother died with mult sclerosis age 58, sister-well Family History of Breast Cancer: Aunt x 2  Social History: Last updated: 10/25/2008 Marital Status: Married Children: 3  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 -stopped at age 9 Alcohol Use - yes-1 glass daily Daily Caffeine Use-2 cups daily Illicit Drug Use - no  Risk Factors: Smoking Status: quit (October 25, 2008)  Review of Systems General:  Complains of chills, fatigue, fever, loss of appetite, malaise, and sleep disorder.  Eyes:  Denies blurring and eye irritation. ENT:  Complains of postnasal drainage and sore throat; denies ear discharge, earache, and sinus pressure. CV:  Denies chest pain or discomfort, lightheadness, and palpitations. Resp:  Denies cough, shortness of breath, and wheezing. GI:  Complains of loss of appetite; denies abdominal pain, change in bowel habits, indigestion, nausea, and vomiting. MS:  Denies joint pain, joint redness, and joint swelling. Derm:  Denies insect bite(s), itching, lesion(s), poor wound healing, and rash. Neuro:  Complains of headaches; denies numbness, poor  balance, and tingling. Psych:  is getting down about feeling sick this week. Endo:  Denies cold intolerance, excessive thirst, excessive urination, and heat intolerance. Heme:  Denies abnormal bruising, bleeding, and enlarge lymph nodes.  Physical Exam  General:  Well-developed,well-nourished,in no acute distress; alert,appropriate and cooperative throughout examination Head:  normocephalic, atraumatic, and no abnormalities observed.   Eyes:  vision grossly intact, pupils equal, pupils round, and pupils reactive to light.  no conjunctival pallor, injection or icterus  stye on L upper lid- see skin exam Ears:  R ear normal and L ear normal.   Nose:  nares are boggy but clear  Mouth:  pharynx pink and moist.   Neck:  supple with full rom and no masses or thyromegally, no JVD or carotid bruit  Chest Wall:  No deformities, masses, or tenderness noted. Lungs:  Normal respiratory effort, chest expands symmetrically. Lungs are clear to auscultation, no crackles or wheezes. Heart:  Normal rate and regular rhythm. S1 and S2 normal without gallop, murmur, click, rub or other extra sounds. Abdomen:  Bowel sounds positive,abdomen soft and non-tender without masses, organomegaly or hernias noted. Msk:  No deformity or scoliosis noted of thoracic or lumbar spine.  no acute joint changes  Extremities:  No clubbing, cyanosis, edema, or deformity noted with normal full range of motion of all joints.   Neurologic:  sensation intact to light touch, gait normal, and DTRs symmetrical and normal.   Skin:  stye on R upper eyelid with erythema and swelling at lash line/ no drainage 2-3 mm  Cervical Nodes:  No lymphadenopathy noted Inguinal Nodes:  No significant adenopathy Psych:  seems fatigued today good eye contact and communication skills   Impression & Recommendations:  Problem # 1:  MALAISE AND FATIGUE (ICD-780.79) Assessment New with headache worse on R  also stye on eye (no rash or vesicles)    check lab incl esr to r/o TA fluids/rest  disc poss of viral syndrome be on look out for rash or insect bites as well Orders: Venipuncture ([address]) TLB-BMP (Basic Metabolic Panel-BMET) (80048-METABOL) TLB-CBC Platelet - w/Differential (85025-CBCD) TLB-TSH (Thyroid Stimulating Hormone) (84443-TSH) TLB-Sedimentation Rate (ESR) (85652-ESR)  Complete Medication List: 1)  Evista 60 Mg Tabs (Raloxifene hcl) .... One by mouth once daily 2)  Folbic 2.5-25-2 Mg Tabs (Fa-pyridoxine-cyancobalamin) .Marland Kitchen.. 1 by mouth once daily 3)  Fish Oil  .... Take by mouth as directed 4)  Vit E  .... Take by mouth as directed 5)  Vit C  .... Take by mouth as directed 6)  Calcium  .... Take by mouth as directed 7)  B Complex  .... Take by mouth as directed 8)  Multi Vit  .... Take by mouth as directed 9)  Acidophilus  .... Take by mouth as directed 10)  Vitamin D 1000 Unit Tabs (Cholecalciferol) .... Take 1 tablet by mouth once a day 11)  Resveratrol  .... Take 1 tablet by mouth once a day 12)  Biotin  .Marland KitchenMarland KitchenMarland Kitchen  Take 1 tablet by mouth once a day 13)  Advil 200 Mg Tabs (Ibuprofen) .... Otc as directed. 14)  Flax Oil (Flaxseed (linseed)) .... Powder that adds to cereal  Patient Instructions: 1)  labs today for ill feeling/ fatigue and headache  2)  tylenol is ok for headache and fever 3)  update me if worse fever or other symptoms 4)  really try to increase the fluids and get as much rest as you need  5)  I will update you when labs  6)  continue warm compress on style on eye  7)  if any rash on face - update me asap  Current Allergies (reviewed today): No known allergies

## 2010-08-23 NOTE — Miscellaneous (Signed)
Summary: BONE DENSITY  Clinical Lists Changes  Orders: Added new Test order of T-Bone Densitometry (77080) - Signed Added new Test order of T-Lumbar Vertebral Assessment (77082) - Signed 

## 2010-08-25 NOTE — Assessment & Plan Note (Signed)
Summary: cough, congestion/alc   Vital Signs:  Patient profile:   65 year old female Weight:      105.25 pounds Temp:     98.6 degrees F oral Pulse rate:   72 / minute Pulse rhythm:   regular BP sitting:   116 / 76  (left arm) Cuff size:   regular  Vitals Entered By: Selena Batten Dance CMA Duncan Dull) (August 05, 2010 4:09 PM) CC: Cough and congestion x2 weeks Comments Fatigue, Headache, greenish mucus with cough and nose   History of Present Illness: CC: cough/congestion  11d h/o feeling tired, iill.  + nasal congestion, cough in am productive of green phlegm, chills.  Also green phlegm in am from nose, but then rest of day with clear mucous.  Also with HA.  Friday had temperature to 100.9, none since.  + decreased appetite.  Tried vitamin C, herbal teas, water.  No abd pain, n/v/d, rashes, muscle pain or joint pains.  No ST.  no ear pain/tooth pain.  Husband also sick.  No smokers at home.  No h/o asthma.  Current Medications (verified): 1)  Evista 60 Mg  Tabs (Raloxifene Hcl) .... One By Mouth Once Daily 2)  Folbic 2.5-25-2 Mg  Tabs (Fa-Pyridoxine-Cyancobalamin) .Marland Kitchen.. 1 By Mouth Once Daily 3)  Fish Oil .... Take By Mouth As Directed 4)  Vit E .... Take By Mouth As Directed 5)  Vit C .... Take By Mouth As Directed 6)  Calcium .... Take By Mouth As Directed 7)  B Complex .... Take By Mouth As Directed 8)  Multi Vit .... Take By Mouth As Directed 9)  Acidophilus .... Take By Mouth As Directed 10)  Vitamin D 1000 Unit Tabs (Cholecalciferol) .... Take 1 Tablet By Mouth Once A Day 11)  Resveratrol .... Take 1 Tablet By Mouth Once A Day 12)  Biotin .... Take 1 Tablet By Mouth Once A Day 13)  Advil 200 Mg Tabs (Ibuprofen) .... Otc As Directed. 14)  Flax   Oil (Flaxseed (Linseed)) .... 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 -stopped at age 61 Alcohol Use - yes-1 glass daily Daily Caffeine Use-2 cups daily Illicit Drug Use - no  Review of Systems       per HPI  Physical Exam  General:  evidently congested, tired Head:  no sinus tenderness Eyes:  PERRLA, EOMI, no injection Ears:  TMs clear bilaterally Nose:  nares injected bilaterally Mouth:  MMM, no pharyngeal erythema/edema Neck:  supple with full rom and no masses or thyromegally, no JVD or carotid bruit  Lungs:  Normal respiratory effort, chest expands symmetrically. Lungs are clear to auscultation, no crackles or wheezes. Heart:  Normal rate and regular rhythm. S1 and S2 normal without gallop, murmur, click, rub or other extra sounds. Pulses:  2+ rad pulses, brisk cap refill Extremities:  no pedal edema   Impression & Recommendations:  Problem # 1:  SINUSITIS, ACUTE (ICD-461.9)  given going on for 11 days, treat as sinus infection.  amoxicillin twice daily for 10 days.  red flags to return discussed  Her updated medication list for  this problem includes:    Amoxicillin 500 Mg Caps (Amoxicillin) ..... One twice daily x 10 days  Complete Medication List: 1)  Evista 60 Mg Tabs (Raloxifene hcl) .... One by mouth once daily 2)  Folbic 2.5-25-2 Mg Tabs (Fa-pyridoxine-cyancobalamin) .Marland Kitchen.. 1 by mouth once daily 3)  Fish Oil  .... Take by mouth as directed 4)  Vit E  .... Take by mouth as directed 5)  Vit C  .... Take by mouth as directed 6)  Calcium  .... Take by mouth as directed 7)  B Complex  .... Take by mouth as directed 8)  Multi Vit  .... Take by mouth as directed 9)  Acidophilus  .... Take by mouth as directed 10)  Vitamin D 1000 Unit Tabs (Cholecalciferol) ....  Take 1 tablet by mouth once a day 11)  Resveratrol  .... Take 1 tablet by mouth once a day 12)  Biotin  .... Take 1 tablet by mouth once a day 13)  Advil 200 Mg Tabs (Ibuprofen) .... Otc as directed. 14)  Flax Oil (Flaxseed (linseed)) .... Powder that adds to cereal 15)  Amoxicillin 500 Mg Caps (Amoxicillin) .... One twice daily x 10 days  Patient Instructions: 1)  You have possible sinus infection given duration of illness. 2)  Take medicines as prescribed: amoxicillin twice daily for 10 days 3)  Take guaifenesin 400mg  IR 1 pills in am and at noon with plenty of fluid to help mobilize mucous (or simple mucinex). 4)  Use nasal saline spray or neti pot to help drainage of sinuses. 5)  If you start having fevers >101.5, trouble swallowing or breathing, or are worsening instead of improving as expected, you may need to be seen again. 6)  Good to see you today, call clinic with questions.  Prescriptions: AMOXICILLIN 500 MG CAPS (AMOXICILLIN) one twice daily x 10 days  #20 x 0   Entered and Authorized by:   Eustaquio Boyden  MD   Signed by:   Eustaquio Boyden  MD on 08/05/2010   Method used:   Electronically to        [facility]* (retail)       [address]       [place], [place]  [address]       Ph: [phone]       Fax: [phone]   RxID:   [id number]    Orders Added: 1)  Est. Patient Level III [84132]    Current Allergies (reviewed today): No known allergies

## 2010-11-02 ENCOUNTER — Other Ambulatory Visit: Payer: Self-pay | Admitting: *Deleted

## 2010-11-02 MED ORDER — FA-PYRIDOXINE-CYANOCOBALAMIN 2.5-25-2 MG PO TABS: 1.0000 | ORAL_TABLET | Freq: Every day | ORAL | Status: DC

## 2010-11-02 MED ORDER — RALOXIFENE HCL 60 MG PO TABS: 60.0000 mg | ORAL_TABLET | Freq: Every day | ORAL | Status: DC

## 2010-11-22 ENCOUNTER — Encounter: Payer: Self-pay | Admitting: Family Medicine

## 2010-11-23 ENCOUNTER — Ambulatory Visit (INDEPENDENT_AMBULATORY_CARE_PROVIDER_SITE_OTHER): Payer: BC Managed Care – PPO | Admitting: Family Medicine

## 2010-11-23 ENCOUNTER — Encounter: Payer: Self-pay | Admitting: Family Medicine

## 2010-11-23 DIAGNOSIS — R634 Abnormal weight loss: Secondary | ICD-10-CM

## 2010-11-23 DIAGNOSIS — L723 Sebaceous cyst: Secondary | ICD-10-CM

## 2010-11-23 DIAGNOSIS — L729 Follicular cyst of the skin and subcutaneous tissue, unspecified: Secondary | ICD-10-CM | POA: Insufficient documentation

## 2010-11-23 DIAGNOSIS — M949 Disorder of cartilage, unspecified: Secondary | ICD-10-CM

## 2010-11-23 DIAGNOSIS — Z5181 Encounter for therapeutic drug level monitoring: Secondary | ICD-10-CM | POA: Insufficient documentation

## 2010-11-23 DIAGNOSIS — M899 Disorder of bone, unspecified: Secondary | ICD-10-CM

## 2010-11-23 NOTE — Assessment & Plan Note (Signed)
Superficial resolving cyst under R arm Adv to keep clean/ warm compresses and adv if not imp

## 2010-11-23 NOTE — Patient Instructions (Signed)
Keep area under arm clean and dry  Triple antibiotic ointment is ok  Use warm compress regularly  If worse- increased redness or fever let me know  If this does not go away let me know  Aim to weigh yourself once per week maximum Review your supplements and limit them to calcium and vit D and only other things you think are really helping

## 2010-11-23 NOTE — Assessment & Plan Note (Addendum)
Reviewed all supplements in detail with pros and cons - so pt can make informed decision about each Will stop mvi and likely E and C ? If biotin is helping

## 2010-11-23 NOTE — Progress Notes (Signed)
  Subjective:    Patient ID: Veronica Ward, female    DOB: 09-20-1945, 65 y.o.   MRN: [id number]  HPI Is here for several problems   Last Friday had a pain under her R arm - red and painful  Since then it has steadily gone down and less painful  In past had a big LN under L arm so this concerns her  Does not want to worry about it   Weight is down  103-104 -- -- her default weight is going down  Eats normally  Eats healthy food and exercises  Did start drinking one cup of coffee in the am again -  Has not hurt her sleep  Herbal tea for lunch  Is type A personality   She is not too unhappy about it  Has always had a bit of body dysmorphic disorder- but has good control over it  No binge or purge or problems -just recognizes it  Does weigh every day   Also had a question - about taking a mvi  Also wants to rev all supplements and see which ones are favorable Biotin does not seem to be helping skin or hair  On folic acid from neurologist to prevent problems  Was told to take E and C        Review of Systems Review of Systems  Constitutional: Negative for fever, appetite change, fatigue and unexpected weight change.  Eyes: Negative for pain and visual disturbance.  Respiratory: Negative for cough and shortness of breath.   Cardiovascular: Negative.   Gastrointestinal: Negative for nausea, diarrhea and constipation.  Genitourinary: Negative for urgency and frequency.  Skin: Negative for pallor.  Neurological: Negative for weakness, light-headedness, numbness and headaches.  Hematological: Negative for adenopathy. Does not bruise/bleed easily.  Psychiatric/Behavioral: Negative for dysphoric mood. The patient is not nervous/anxious.         Objective:   Physical Exam  Constitutional: She appears well-developed and well-nourished.  HENT:  Head: Normocephalic and atraumatic.  Eyes: Conjunctivae and EOM are normal. Pupils are equal, round, and reactive to light.  Neck:  Normal range of motion. Neck supple. No JVD present.  Cardiovascular: Normal rate, regular rhythm and normal heart sounds.   Pulmonary/Chest: Effort normal and breath sounds normal. She has no wheezes. She exhibits no tenderness.  Abdominal: Soft. Bowel sounds are normal. She exhibits no mass. There is no tenderness.  Musculoskeletal: She exhibits no edema and no tenderness.  Lymphadenopathy:    She has no cervical adenopathy.  Neurological: She is alert. She has normal reflexes. Coordination normal.  Skin: Skin is warm and dry. No rash noted. No erythema.  Psychiatric: She has a normal mood and affect.          Assessment & Plan:

## 2010-11-23 NOTE — Assessment & Plan Note (Signed)
3-4 lb since first coming here years ago Also some evidence of body image issues  Stable healthy diet / very active caffiene may be cutting appetite and inc wt loss  Adv to start weighing once weekly instead of several times daily

## 2010-12-05 ENCOUNTER — Telehealth: Payer: Self-pay

## 2010-12-05 NOTE — Telephone Encounter (Signed)
Yes- I think that would be a good idea Please schedule for Tdap unless any contraindications

## 2010-12-05 NOTE — Telephone Encounter (Signed)
She and her husband mel [place] going out of town this week to see their new grandchild and wonders if should get a tdap.Please advise. Pt can be reached at [phone].

## 2010-12-05 NOTE — Telephone Encounter (Signed)
Patient notified as instructed by telephone. Veronica Ward and Veronica Ward's husband are not sick with any illness and have never had a severe reaction to an immunization. Scheduled nurse visit for Veronica Ward and her husband for 12/06/10 at 3:15. Veronica Ward are leaving early Thurs morning for [place] to meet their new grandchild.

## 2010-12-06 ENCOUNTER — Ambulatory Visit: Payer: BC Managed Care – PPO

## 2010-12-09 NOTE — Assessment & Plan Note (Signed)
[facility]                         GASTROENTEROLOGY OFFICE NOTE   NAME:Veronica Ward, Veronica Ward                       MRN:          [id number]  DATE:08/03/2006                            DOB:          01/17/1946    REASON FOR CONSULTATION:  Rectal growth.   Veronica Ward is a pleasant, 66 year old white female, referred through the  courtesy of Dr. Milinda Antis for evaluation.  On routine exam, an abnormality  in the rectal area was noted.  Veronica Ward has no GI complaints, including  abdominal pain, rectal pain, rectal bleeding or melena.  Colonoscopy in  2004 demonstrated internal hemorrhoids.   PAST MEDICAL HISTORY:  Unremarkable.  She is status post tubal ligation.   FAMILY HISTORY:  Pertinent for heart disease in her father, as well as  multiple myeloma.   MEDICATIONS INCLUDE:  Various vitamins and turmeric.   ALLERGIES:  She has no allergies.   SOCIAL HISTORY:  She does not smoke.  She drinks occasionally.  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   REVIEW OF SYSTEMS:  Was reviewed and is negative.   PHYSICAL EXAMINATION:  Pulse 72, blood pressure 98/66, weight 115.  HEENT: EOMI. PERRLA. Sclerae are anicteric.  Conjunctivae are pink.  NECK:  Supple without thyromegaly, adenopathy or carotid bruits.  CHEST:  Clear to auscultation and percussion without adventitious  sounds.  CARDIAC:  Regular rhythm; normal S1 S2.  There are no murmurs, gallops  or rubs.  ABDOMEN:  Bowel sounds are normoactive.  Abdomen is soft, non-tender and  non-distended.  There are no abdominal masses, tenderness, splenic  enlargement or hepatomegaly.  EXTREMITIES:  Full range of motion.  No cyanosis, clubbing or edema.  RECTAL:  She has an external hemorrhoid and skin tags with a 2-3 mm  somewhat fibrotic area, attached to one hemorrhoid.  There are no  internal rectal masses.  Edmon Crape is Hemoccult-negative.   IMPRESSION:  Hemorrhoidal disease.   RECOMMENDATION:  My  recommendation is for no further GI workup at this  time.    Barbette Hair. Arlyce Dice, MD,FACG  Electronically Signed   RDK/MedQ  DD: 08/03/2006  DT: 08/03/2006  Job #: [id number]   cc:   Marne A. Milinda Antis, MD

## 2010-12-23 HISTORY — PX: OTHER SURGICAL HISTORY: SHX169

## 2011-01-11 ENCOUNTER — Encounter: Payer: Self-pay | Admitting: Family Medicine

## 2011-01-11 ENCOUNTER — Ambulatory Visit (INDEPENDENT_AMBULATORY_CARE_PROVIDER_SITE_OTHER): Payer: BC Managed Care – PPO | Admitting: Family Medicine

## 2011-01-11 VITALS — BP 110/68 | HR 80 | Temp 98.7°F | Wt 103.8 lb

## 2011-01-11 DIAGNOSIS — IMO0002 Reserved for concepts with insufficient information to code with codable children: Secondary | ICD-10-CM | POA: Insufficient documentation

## 2011-01-11 DIAGNOSIS — L089 Local infection of the skin and subcutaneous tissue, unspecified: Secondary | ICD-10-CM

## 2011-01-11 MED ORDER — AMOXICILLIN-POT CLAVULANATE 875-125 MG PO TABS: 1.0000 | ORAL_TABLET | Freq: Two times a day (BID) | ORAL | Status: AC

## 2011-01-11 MED ORDER — SULFAMETHOXAZOLE-TRIMETHOPRIM 800-160 MG PO TABS: 1.0000 | ORAL_TABLET | Freq: Two times a day (BID) | ORAL | Status: AC

## 2011-01-11 MED ORDER — HYDROCODONE-ACETAMINOPHEN 5-500 MG PO TABS: 1.0000 | ORAL_TABLET | Freq: Four times a day (QID) | ORAL | Status: DC | PRN

## 2011-01-11 NOTE — Patient Instructions (Signed)
Go ahead and stop evista  Continue warm compresses on finger  Keep clean with antibacterial soap and water  If worse pain or rednesss or fever - please go to there ER  Take both augmentin and also septra as directed with food  Follow up on Friday for a re check with first availible doctor

## 2011-01-11 NOTE — Progress Notes (Signed)
Subjective:    Patient ID: Veronica Ward, female    DOB: 1946-02-16, 65 y.o.   MRN: [id number]  HPI L index finger  ? Infection  Is a nailbiter  Started soaking it in salt water and warm water  Got better - Monday  Yesterday fine until evening - then really sore all night long  No fever - felt ok  No drainage   Put neosporin on it  Did sterilize needle and tried to drain   Patient Active Problem List  Diagnoses  . BASAL CELL CARCINOMA, FACE  . STRESS REACTION, ACUTE, WITH EMOTIONAL DISTURBANCE  . HEMORRHOIDS  . FIBROCYSTIC BREAST DISEASE  . PSORIASIS  . SEBORRHEIC KERATOSIS  . URTICARIA  . TOE PAIN  . OSTEOPENIA  . SLEEP DISORDER  . MALAISE AND FATIGUE  . HEADACHE  . BASAL CELL CARCINOMA, FACE  . Cyst of skin  . Encounter for medication monitoring  . Weight loss  . Felon   Past Medical History  Diagnosis Date  . Hx of basal cell carcinoma     face  . Diffuse cystic mastopathy   . Headache   . Unspecified hemorrhoids without mention of complication   . Other malaise and fatigue   . Disorder of bone and cartilage, unspecified   . Other psoriasis   . Other seborrheic keratosis   . Sleep disturbance, unspecified   . Predominant disturbance of emotions   . Pain in limb   . Urticaria, unspecified   . History of shingles   . Anxiety   . Anemia   . Dysautonomia     mild   Past Surgical History  Procedure Date  . Cervical conization w/bx   . Cervical cancer 1973  . Breast biopsy 2001    benign  . Tubal ligation   . Knee surgery     right  . Dexa 03/2002    oseopenia  . Colonoscopy 12/04    int. hemorrhoids  . Dexa 07/2004    decreased BMD, osteopenia  . Dexa 02/2007    Osteopenia  . Breast lumpectomy     left underarm  . Birthmark removal childhood  . Dexa 9/10    Osteopenia slightly worse   History  Substance Use Topics  . Smoking status: Former Smoker    Quit date: 07/25/1963  . Smokeless tobacco: Never Used  . Alcohol Use: Yes     1 glass  daily   Family History  Problem Relation Age of Onset  . Multiple myeloma Father   . Coronary artery disease Father   . Transient ischemic attack Mother   . Lupus Maternal Aunt   . Prostate cancer Maternal Uncle   . Cancer Maternal Grandfather   . Multiple sclerosis Brother   . Breast cancer      Aunt x2   No Known Allergies Current Outpatient Prescriptions on File Prior to Visit  Medication Sig Dispense Refill  . Ascorbic Acid (VITAMIN C CR PO) Take by mouth as directed.        Marland Kitchen BIOTIN PO Take 1 tablet by mouth daily.        . Calcium Carbonate-Vit D-Min 1200-1000 MG-UNIT CHEW       . CALCIUM PO Take by mouth daily.       . cholecalciferol (VITAMIN D) 1000 UNITS tablet Take 1,000 Units by mouth daily.        . fish oil-omega-3 fatty acids 1000 MG capsule Take 2 g by mouth daily.        Marland Kitchen  ibuprofen (ADVIL,MOTRIN) 200 MG tablet Take 200 mg by mouth every 6 (six) hours as needed.        . Lactobacillus (ACIDOPHILUS PO) Take by mouth as directed.        . folic acid-pyridoxine-cyancobalamin (FOLBIC) 2.5-25-2 MG TABS Take 1 tablet by mouth daily.  30 each  11  . RESVERATROL PO Take 1 tablet by mouth daily.        Marland Kitchen VITAMIN E PO Take by mouth as directed.             Review of Systems Review of Systems  Constitutional: Negative for fever, appetite change, fatigue and unexpected weight change.  Eyes: Negative for pain and visual disturbance.  Respiratory: Negative for cough and shortness of breath.   Cardiovascular: Negative.  for cp or palp Gastrointestinal: Negative for nausea, diarrhea and constipation.  Genitourinary: Negative for urgency and frequency.  Skin: Negative for pallor. pos for redness Neurological: Negative for weakness, light-headedness, numbness and headaches.  Hematological: Negative for adenopathy. Does not bruise/bleed easily.  Psychiatric/Behavioral: Negative for dysphoric mood. The patient is not nervous/anxious.          Objective:   Physical Exam    Constitutional: She appears well-developed and well-nourished.  HENT:  Head: Normocephalic and atraumatic.  Eyes: Conjunctivae and EOM are normal. Pupils are equal, round, and reactive to light.  Neck: Normal range of motion. Neck supple. No JVD present. No thyromegaly present.  Cardiovascular: Normal rate, regular rhythm, normal heart sounds and intact distal pulses.   Pulmonary/Chest: Effort normal and breath sounds normal.  Musculoskeletal: Normal range of motion.  Lymphadenopathy:    She has no cervical adenopathy.  Neurological: She is alert. She has normal reflexes.  Skin: Skin is warm and dry. No rash noted. No pallor.       L index finger - area 1 by .5 cm oval of erythema and tight swelling - with small area of pallor and scab at the center Is tender Nl rom finger Nl perfusion and sensation   Psychiatric: She has a normal mood and affect.          Assessment & Plan:

## 2011-01-11 NOTE — Assessment & Plan Note (Signed)
L index finger with area of tight/ firm swelling and redness over distal lateral phalynx  No drainage and not fluctuant  Not bordering nail Tender to the touch Will tx with abx (double cover with augmentin/ septra)--pt unsure of mrsa exp inst for care given and red flags to watch for F/u Friday with first avail (I am out of town) -- if soft or evid of pus may need to be drained

## 2011-01-12 ENCOUNTER — Encounter: Payer: Self-pay | Admitting: Family Medicine

## 2011-01-12 ENCOUNTER — Ambulatory Visit (INDEPENDENT_AMBULATORY_CARE_PROVIDER_SITE_OTHER): Payer: BC Managed Care – PPO | Admitting: Family Medicine

## 2011-01-12 ENCOUNTER — Telehealth: Payer: Self-pay | Admitting: *Deleted

## 2011-01-12 ENCOUNTER — Ambulatory Visit (HOSPITAL_COMMUNITY)
Admission: AD | Admit: 2011-01-12 | Discharge: 2011-01-12 | Disposition: A | Discharge status: Home / Self Care | Payer: BC Managed Care – PPO | Source: Ambulatory Visit | Attending: Orthopedic Surgery | Admitting: Orthopedic Surgery

## 2011-01-12 ENCOUNTER — Ambulatory Visit: Payer: BC Managed Care – PPO | Admitting: Family Medicine

## 2011-01-12 VITALS — BP 124/74 | HR 84 | Temp 98.7°F | Wt 103.0 lb

## 2011-01-12 DIAGNOSIS — M899 Disorder of bone, unspecified: Secondary | ICD-10-CM | POA: Insufficient documentation

## 2011-01-12 DIAGNOSIS — IMO0002 Reserved for concepts with insufficient information to code with codable children: Secondary | ICD-10-CM

## 2011-01-12 DIAGNOSIS — M949 Disorder of cartilage, unspecified: Secondary | ICD-10-CM | POA: Insufficient documentation

## 2011-01-12 DIAGNOSIS — F411 Generalized anxiety disorder: Secondary | ICD-10-CM | POA: Insufficient documentation

## 2011-01-12 DIAGNOSIS — Z01812 Encounter for preprocedural laboratory examination: Secondary | ICD-10-CM | POA: Insufficient documentation

## 2011-01-12 LAB — CBC
HCT: 34 % — ABNORMAL LOW (ref 36.0–46.0)
Hemoglobin: 11.9 g/dL — ABNORMAL LOW (ref 12.0–15.0)
MCH: 33.6 pg (ref 26.0–34.0)
MCHC: 35 g/dL (ref 30.0–36.0)
MCV: 96 fL (ref 78.0–100.0)
Platelets: 149 10*3/uL — ABNORMAL LOW (ref 150–400)
RBC: 3.54 MIL/uL — ABNORMAL LOW (ref 3.87–5.11)
RDW: 13.4 % (ref 11.5–15.5)
WBC: 6.9 10*3/uL (ref 4.0–10.5)

## 2011-01-12 LAB — SURGICAL PCR SCREEN
MRSA, PCR: NEGATIVE
Staphylococcus aureus: POSITIVE — AB

## 2011-01-12 NOTE — Progress Notes (Addendum)
Subjective:    Patient ID: Veronica Ward, female    DOB: 11-20-1945, 65 y.o.   MRN: [id number]  HPI CC: finger infection?  Nailbiter. Started Sunday (4d ago), soaked finger.  Monday better.  Tuesday started feeling pain, noticed swelling and redness.  Sterilized needle and attempted drained at home, nothing came out. Came to see PCP yesterday, reviewed note. Concern for finger infection, started on septra and augmentin.  Has had 3 courses of this so far. vicodin for pain as well, took last one at 2am. Last night noticed purple and swollen finger.  Did not go to ER although advised to. Tender but not throbbing like previously. Swelling more. No fevers/chills.  + nausea, attributed to abx.  Appetite ok. Has continued to soak until today.  No ho DM, no h/o herpes.  Patient Active Problem List  Diagnoses  . BASAL CELL CARCINOMA, FACE  . STRESS REACTION, ACUTE, WITH EMOTIONAL DISTURBANCE  . HEMORRHOIDS  . FIBROCYSTIC BREAST DISEASE  . PSORIASIS  . SEBORRHEIC KERATOSIS  . URTICARIA  . TOE PAIN  . OSTEOPENIA  . SLEEP DISORDER  . MALAISE AND FATIGUE  . HEADACHE  . BASAL CELL CARCINOMA, FACE  . Cyst of skin  . Encounter for medication monitoring  . Weight loss  . Felon   Past Medical History  Diagnosis Date  . Hx of basal cell carcinoma     face  . Diffuse cystic mastopathy   . Headache   . Unspecified hemorrhoids without mention of complication   . Other malaise and fatigue   . Disorder of bone and cartilage, unspecified   . Other psoriasis   . Other seborrheic keratosis   . Sleep disturbance, unspecified   . Predominant disturbance of emotions   . Pain in limb   . Urticaria, unspecified   . History of shingles   . Anxiety   . Anemia   . Dysautonomia     mild   Past Surgical History  Procedure Date  . Cervical conization w/bx   . Cervical cancer 1973  . Breast biopsy 2001    benign  . Tubal ligation   . Knee surgery     right  . Dexa 03/2002    oseopenia   . Colonoscopy 12/04    int. hemorrhoids  . Dexa 07/2004    decreased BMD, osteopenia  . Dexa 02/2007    Osteopenia  . Breast lumpectomy     left underarm  . Birthmark removal childhood  . Dexa 9/10    Osteopenia slightly worse   History  Substance Use Topics  . Smoking status: Former Smoker    Quit date: 07/25/1963  . Smokeless tobacco: Never Used  . Alcohol Use: Yes     1 glass daily   Family History  Problem Relation Age of Onset  . Multiple myeloma Father   . Coronary artery disease Father   . Transient ischemic attack Mother   . Lupus Maternal Aunt   . Prostate cancer Maternal Uncle   . Cancer Maternal Grandfather   . Multiple sclerosis Brother   . Breast cancer      Aunt x2   No Known Allergies Current Outpatient Prescriptions on File Prior to Visit  Medication Sig Dispense Refill  . amoxicillin-clavulanate (AUGMENTIN) 875-125 MG per tablet Take 1 tablet by mouth every 12 (twelve) hours.  20 tablet  0  . Ascorbic Acid (VITAMIN C CR PO) Take by mouth as directed.        Marland Kitchen  BIOTIN PO Take 1 tablet by mouth daily.        . Calcium Carbonate-Vit D-Min 1200-1000 MG-UNIT CHEW       . CALCIUM PO Take by mouth daily.       . cholecalciferol (VITAMIN D) 1000 UNITS tablet Take 1,000 Units by mouth daily.        . fish oil-omega-3 fatty acids 1000 MG capsule Take 2 g by mouth daily.        . folic acid-pyridoxine-cyancobalamin (FOLBIC) 2.5-25-2 MG TABS Take 1 tablet by mouth daily.  30 each  11  . HYDROcodone-acetaminophen (VICODIN) 5-500 MG per tablet Take 1 tablet by mouth every 6 (six) hours as needed for pain (watch out for sedation ).  20 tablet  0  . ibuprofen (ADVIL,MOTRIN) 200 MG tablet Take 200 mg by mouth every 6 (six) hours as needed.        . Lactobacillus (ACIDOPHILUS PO) Take by mouth as directed.        . Multiple Vitamin (MULTIVITAMIN) tablet Take 1 tablet by mouth daily.        Marland Kitchen RESVERATROL PO Take 1 tablet by mouth daily.        Marland Kitchen  sulfamethoxazole-trimethoprim (SEPTRA DS) 800-160 MG per tablet Take 1 tablet by mouth 2 (two) times daily.  20 tablet  0  . VITAMIN E PO Take by mouth as directed.         Review of Systems Per HPI    Objective:   Physical Exam  Nursing note and vitals reviewed. Constitutional: She appears well-developed and well-nourished. No distress.  Cardiovascular:  Pulses:      Radial pulses are 2+ on the right side, and 2+ on the left side.  Musculoskeletal:       Hands:      Left index finger pulp with 2cm x 2cm fluctuant pustular deep vesicle spreading past DIP joint, with edema spreading past MCP. Unable to flex DIP without tendenress, stiff throughout.  Neurological: No sensory deficit.          Assessment & Plan:

## 2011-01-12 NOTE — Patient Instructions (Addendum)
[address] in [place]. Dr. Merlyn Lot can see you now. Pass by up front for directions. Nothing to eat or drink until you see him.

## 2011-01-12 NOTE — Telephone Encounter (Signed)
Patient called this morning and says that her finger has turned a little purple and seems to be more swollen than before and feels warm. Dr. Milinda Antis had asked that she come back for recheck on Friday, but patient has rescheduled for today because she wants to make sure it hasn't gotten worse.

## 2011-01-12 NOTE — Assessment & Plan Note (Signed)
Concern for felon, spreading past DIP, worsening swelling. Continue augmentin, septra for now. Called Dr. Merrilee Seashore office, they are able to see her now, will send over there. Appreciated his care of patient. Doubt herpetic whitlow.

## 2011-01-12 NOTE — Telephone Encounter (Signed)
Triage Record Num: [id number] Operator: Remonia Richter Patient Name: Veronica Ward Call Date & Time: 01/11/2011 10:43:39PM Patient Phone: [phone] PCP: Audrie Gallus. Tower Patient Gender: Female PCP Fax : Patient DOB: 22-Mar-1946 Practice Name: [facility] [place] Reason for Call: seen today for infection in index left finger, put on antibiotic/Amox/Clv and told to go to ER if swelling or redness worse tonight,now tonight it looks dark in area of infection and swelling noted now in front of finger near nail and red down to joint in back, afebrile, asking how to proceed,see in 4 hour disposition obtained, advised to go to ER at [facility] as instructed today by her MD for worsening s/s, she agrees Protocol(s) Used: Abrasions, Lacerations, Puncture Wounds Recommended Outcome per Protocol: See Provider within 4 hours Reason for Outcome: Any signs and symptoms of worsening infection Care Advice: ~ Wash area with mild soap and water. Rinse well with warm water. Pat dry. ~ Thoroughly wash hands with soap and water before and after touching the site. ~ SYMPTOM / CONDITION MANAGEMENT ~ INFECTION CONTROL Analgesic/Antipyretic Advice - Acetaminophen: Consider acetaminophen as directed on label or by pharmacist/provider for pain or fever PRECAUTIONS: - Use if there is no history of liver disease, alcoholism, or intake of three or more alcohol drinks per day - Only if approved by provider during pregnancy or when breastfeeding - During pregnancy, acetaminophen should not be taken more than 3 consecutive days without telling provider - Do not exceed recommended dose or frequency ~ Apply local moist heat (such as a warm, wet wash cloth covered with plastic wrap) to the area for 15-20 minutes every 2-3 hours while awake. ~ Analgesic/Antipyretic Advice - NSAIDs: Consider aspirin, ibuprofen, naproxen or ketoprofen for pain or fever as directed on label or by pharmacist/provider. PRECAUTIONS: - If over  22 years of age, should not take longer than 1 week without consulting provider. EXCEPTIONS: - Should not be used if taking blood thinners or have bleeding problems. - Do not use if have history of sensitivity/allergy to any of these medications; or history of cardiovascular, ulcer, kidney, liver disease or diabetes unless approved by provider. - Do not exceed recommended dose or frequency. ~ 01/11/2011 10:53:41PM Page 1 of 1 [id number]

## 2011-01-12 NOTE — Telephone Encounter (Signed)
Noted.will route to Dr. Karie Schwalbe as Lorain Childes.

## 2011-01-13 ENCOUNTER — Ambulatory Visit: Payer: BC Managed Care – PPO | Admitting: Family Medicine

## 2011-01-15 ENCOUNTER — Telehealth: Payer: Self-pay | Admitting: Family Medicine

## 2011-01-15 DIAGNOSIS — R5381 Other malaise: Secondary | ICD-10-CM

## 2011-01-15 DIAGNOSIS — Z13220 Encounter for screening for lipoid disorders: Secondary | ICD-10-CM

## 2011-01-15 DIAGNOSIS — M899 Disorder of bone, unspecified: Secondary | ICD-10-CM

## 2011-01-15 DIAGNOSIS — R5383 Other fatigue: Secondary | ICD-10-CM

## 2011-01-15 DIAGNOSIS — Z131 Encounter for screening for diabetes mellitus: Secondary | ICD-10-CM | POA: Insufficient documentation

## 2011-01-15 LAB — CULTURE, ROUTINE-ABSCESS

## 2011-01-15 NOTE — Telephone Encounter (Signed)
Message copied by Judy Pimple on Sun Jan 15, 2011  1:54 PM ------      Message from: Baldomero Lamy      Created: Mon Jan 09, 2011 11:14 AM      Regarding: Cpx labs Mon       Please order  future cpx labs for pt's upcomming lab appt.      Thanks      Rodney Booze

## 2011-01-17 LAB — ANAEROBIC CULTURE

## 2011-01-21 ENCOUNTER — Other Ambulatory Visit: Payer: Self-pay | Admitting: Family Medicine

## 2011-01-21 ENCOUNTER — Ambulatory Visit (INDEPENDENT_AMBULATORY_CARE_PROVIDER_SITE_OTHER): Payer: BC Managed Care – PPO | Admitting: Family Medicine

## 2011-01-21 ENCOUNTER — Telehealth: Payer: Self-pay | Admitting: *Deleted

## 2011-01-21 VITALS — BP 100/60 | Temp 98.2°F | Wt 101.5 lb

## 2011-01-21 DIAGNOSIS — R21 Rash and other nonspecific skin eruption: Secondary | ICD-10-CM | POA: Insufficient documentation

## 2011-01-21 LAB — BASIC METABOLIC PANEL
BUN: 25 mg/dL — AB (ref 4–21)
BUN: 25 mg/dL — ABNORMAL HIGH (ref 6–23)
CO2: 22 mEq/L (ref 19–32)
CO2: 22 mmol/L
Calcium: 8.8 mg/dL
Calcium: 8.8 mg/dL (ref 8.4–10.5)
Chloride: 104 mEq/L (ref 96–112)
Chloride: 104 mmol/L
Creat: 0.8
Creat: 0.8 mg/dL (ref 0.50–1.10)
Glucose, Bld: 105 mg/dL — ABNORMAL HIGH (ref 70–99)
Glucose: 105
Potassium: 3.9 mEq/L (ref 3.5–5.3)
Potassium: 3.9 mmol/L
Sodium: 137 mEq/L (ref 135–145)
Sodium: 137 mmol/L (ref 137–147)

## 2011-01-21 LAB — CBC WITH DIFFERENTIAL/PLATELET
Basophils Absolute: 0 10*3/uL (ref 0.0–0.1)
Basophils Relative: 0 % (ref 0–1)
Eosinophils Absolute: 0.2 10*3/uL (ref 0.0–0.7)
Eosinophils Relative: 4 % (ref 0–5)
HCT: 36.6 % (ref 36.0–46.0)
Hemoglobin: 12.5 g/dL (ref 12.0–15.0)
Lymphocytes Relative: 8 % — ABNORMAL LOW (ref 12–46)
Lymphs Abs: 0.4 10*3/uL — ABNORMAL LOW (ref 0.7–4.0)
MCH: 33.4 pg (ref 26.0–34.0)
MCHC: 34.2 g/dL (ref 30.0–36.0)
MCV: 97.9 fL (ref 78.0–100.0)
Monocytes Absolute: 0.2 10*3/uL (ref 0.1–1.0)
Monocytes Relative: 5 % (ref 3–12)
Neutro Abs: 3.8 10*3/uL (ref 1.7–7.7)
Neutrophils Relative %: 83 % — ABNORMAL HIGH (ref 43–77)
Platelets: 151 10*3/uL (ref 150–400)
RBC: 3.74 MIL/uL — ABNORMAL LOW (ref 3.87–5.11)
RDW: 13.9 % (ref 11.5–15.5)
WBC: 4.6 10*3/uL (ref 4.0–10.5)

## 2011-01-21 LAB — CBC AND DIFFERENTIAL
HCT: 37 %
Hemoglobin: 12.5 g/dL (ref 12.0–16.0)
MCV: 97.9 fL
RBC: 3.74
WBC: 4.6
platelet count: 151

## 2011-01-21 MED ORDER — PREDNISONE 20 MG PO TABS: ORAL_TABLET | ORAL | Status: DC

## 2011-01-21 MED ORDER — DOXYCYCLINE HYCLATE 100 MG PO TABS: 100.0000 mg | ORAL_TABLET | Freq: Two times a day (BID) | ORAL | Status: DC

## 2011-01-21 NOTE — Telephone Encounter (Signed)
Triage Record Num: [id number] Operator: Edgar Frisk Patient Name: Veronica Ward Call Date & Time: 01/21/2011 7:02:13AM Patient Phone: [phone] PCP: Audrie Gallus. Tower Patient Gender: Female PCP Fax : Patient DOB: 05-Feb-1946 Practice Name: [facility] Reason for Call: Pt reports had fever since yesterday with red rash all over. Reports had recent surgery on finger and finger swollen. No other symptoms. Adviced to call for appt this am at [facility] office Care advice per Rash Guideline SS for call back reviewed Protocol(s) Used: Rash Recommended Outcome per Protocol: See Provider within 4 hours Reason for Outcome: Signs and symptoms of worsening infection (quickly getting larger or more areas develop; becoming more painful; purulent

## 2011-01-21 NOTE — Progress Notes (Signed)
  Subjective:    Patient ID: Veronica Ward, female    DOB: 11-10-1945, 65 y.o.   MRN: [id number]  HPI Pt had infxn on L index finger and had operation 10 days ago.  Was started on Percocet and antibiotics.  Vomited x2 days and stopped the pain meds.  Continued to take abx (Augmentin).  Started PT this week.  Thursday was extremely fatigued, chilled, took temp and was 100.5.  Yesterday stayed in bed most of the day, again had low grade fever last night.  Temp improved w/ tylenol.  Developed rash last night.  Woke w/ Tm 101.2.  + nausea, HA.  Denies recent tick exposure   Review of Systems For ROS see HPI     Objective:   Physical Exam  Constitutional: She appears well-developed and well-nourished.       Uncomfortable appearing  HENT:  Head: Normocephalic and atraumatic.  Nose: Nose normal.  Mouth/Throat: Oropharynx is clear and moist.       TMs normal bilaterally  Eyes: Conjunctivae and EOM are normal. Pupils are equal, round, and reactive to light.  Neck: Normal range of motion. Neck supple.  Cardiovascular: Normal rate, regular rhythm and normal heart sounds.   Pulmonary/Chest: Effort normal and breath sounds normal. No respiratory distress. She has no wheezes.  Lymphadenopathy:    She has no cervical adenopathy.  Skin: Rash (diffuse erythematous maculopapular rash- spares palms and soles.  itchy) noted.          Assessment & Plan:

## 2011-01-21 NOTE — Patient Instructions (Addendum)
Take the prednisone as needed for your rash and itching.  Take w/ food to avoid upset stomach (this is optional) Take the Doxycycline as directed in case of rocky mountain spotted fever- take w/ food.  (this is NOT optional) If you need a medicine for nausea, please call We'll notify you of your lab results Call with any questions or concerns Keep your appt on Monday w/ Dr Phillip Heal in there!

## 2011-01-21 NOTE — Assessment & Plan Note (Signed)
Pt w/ diffuse, itchy rash.  More likely to be drug rxn to Augmentin given that pt denies recent tick exposure or outdoor activities but given vague sxs of headache, fever, malaise, and nausea along w/ rash that developed 1-2 days after other sxs must consider RMSF.  Rash does spare palms and soles making it less likely but up to 10% of RMSF cases have no rash at all.  Will empirically start doxy while waiting for lab results.  Script for prednisone also given to help w/ itching but pt very skittish about taking meds at this time.  Pt has f/u already scheduled for Monday- encouraged her to keep this appt.

## 2011-01-23 ENCOUNTER — Encounter: Payer: Self-pay | Admitting: Family Medicine

## 2011-01-23 ENCOUNTER — Other Ambulatory Visit: Payer: Self-pay

## 2011-01-23 ENCOUNTER — Ambulatory Visit (INDEPENDENT_AMBULATORY_CARE_PROVIDER_SITE_OTHER): Payer: BC Managed Care – PPO | Admitting: Family Medicine

## 2011-01-23 ENCOUNTER — Ambulatory Visit: Payer: BC Managed Care – PPO | Admitting: Family Medicine

## 2011-01-23 DIAGNOSIS — R21 Rash and other nonspecific skin eruption: Secondary | ICD-10-CM

## 2011-01-23 LAB — ROCKY MTN SPOTTED FVR ABS PNL(IGG+IGM)
RMSF IgG: 0.74 IV
RMSF IgM: 1.07 IV — ABNORMAL HIGH

## 2011-01-23 NOTE — Patient Instructions (Signed)
Push fluids as much as you can to prevent dehydration. I'm glad finger is doing better. I think feeling bad with nausea and rash came from antibiotics, but unsure if augmentin or septra. Continue doxycycline.   Continue bland diet - rice, applesauce, toast.  Slowly advance diet as tolerated.

## 2011-01-23 NOTE — Assessment & Plan Note (Addendum)
Was on augmentin and septra prior to rash beginning, seems to be improving since finished course. Likely drug rash, although fever somewhat atypical. ? Serum sickness-like reaction as both amox and trim-sulfa can cause this.  As on both, unknown which caused this. Awaiting RMSF titers, if neg, will have pt stop doxy. discussed overall normal results so far. Encouraged push fluids, slowly advance diet to prevent worsening and dehydration.  Pt feels she has been staying well hydrated. Update [place] if worsening.

## 2011-01-23 NOTE — Progress Notes (Signed)
  Subjective:    Patient ID: Veronica Ward, female    DOB: [birth year], 65 y.o.   MRN: [id number]  HPI CC: rash, fever, feeling ill  Seen 01/12/2011 with concern for felon, sent to hand surgeon Dr. Merlyn Lot same day to OR under general anesthesia for debridement.  Was taking augmentin and septra, vomiting and heaving, nauseated.  Was feeling weak.  Thursday night started with red rash throughout body, first noticed on face.  Spared palms/soles.  Fever/chills every day up to 101.  Tmax 102.4.  Seen at [facility] on Saturday, concern for tick-borne illness so CBC, BMP, RMSF titers drawn.  No fever for last 3 days.  Did finish septra/augmentin course.  Reviewed blood work available - BMP WNL, WBC 4.6, Hgb 12.5.  Awaiting RMSF titers.  Endorses HA, continued nausea, some ST.  Overall improving.  Denies significant diarrhea.  No blood in stool or vomit.  + more stinky stool than normal but just one stool a day in am, regular.  No dizziness, SOB, CP.  Finger actually doing better.  Has had hyperbaric wound therapy and PT.    Pt has been drinking ginger ale, thinks staying well hydrated.  No cough, no dysuria, wound doing better.  BP Readings from Last 3 Encounters:  01/23/11 98/60  01/21/11 100/60  01/12/11 124/74  bp tends to run low according to pt. Wt Readings from Last 3 Encounters:  01/23/11 101 lb 0.6 oz (45.831 kg)  01/21/11 101 lb 8 oz (46.04 kg)  01/12/11 103 lb (46.72 kg)   Review of Systems Per HPI    Objective:   Physical Exam  Nursing note and vitals reviewed. Constitutional: She appears well-developed and well-nourished. No distress.       Somewhat tired but nontoxic.  HENT:  Head: Normocephalic and atraumatic.  Mouth/Throat: Oropharynx is clear and moist. No oropharyngeal exudate.  Eyes: Conjunctivae and EOM are normal. Pupils are equal, round, and reactive to light. No scleral icterus.  Neck: Normal range of motion. Neck supple.  Cardiovascular: Normal rate, regular  rhythm, normal heart sounds and intact distal pulses.   No murmur heard. Pulmonary/Chest: Effort normal and breath sounds normal. No respiratory distress. She has no wheezes. She has no rales.  Abdominal: Soft. Bowel sounds are normal. She exhibits no distension. There is no tenderness. There is no rebound and no guarding.  Musculoskeletal: Normal range of motion. She exhibits no edema and no tenderness.  Lymphadenopathy:    She has no cervical adenopathy.  Skin: Rash (diffuse macular rash arms, legs, back, none on face) noted.  Psychiatric: She has a normal mood and affect.          Assessment & Plan:

## 2011-01-24 ENCOUNTER — Telehealth: Payer: Self-pay | Admitting: *Deleted

## 2011-01-24 NOTE — Telephone Encounter (Signed)
Please call patient and notify RMSF titers returned in borderline range (not clearly positive nor negative).   Safest thing is to finish 10 day course of doxycycline.  Update [place] with questions or not improving as expected.

## 2011-01-24 NOTE — Telephone Encounter (Signed)
Received fax from [facility].  Fax in your IN box.

## 2011-01-24 NOTE — Telephone Encounter (Signed)
Patient notified and will finish abx and call if no improvement.

## 2011-01-26 ENCOUNTER — Other Ambulatory Visit: Payer: Self-pay | Admitting: *Deleted

## 2011-01-26 MED ORDER — DOXYCYCLINE HYCLATE 100 MG PO TABS: 100.0000 mg | ORAL_TABLET | Freq: Two times a day (BID) | ORAL | Status: AC

## 2011-01-26 NOTE — Telephone Encounter (Signed)
Patient called questioning if she should take Doxycycline for 10 days or just the 7 days that she was originally prescribed. Dr. Sharen Hones advised to take 10 day course. Additional Rx sent in to complete the course. Patient aware.

## 2011-01-27 ENCOUNTER — Telehealth: Payer: Self-pay | Admitting: *Deleted

## 2011-01-27 NOTE — Telephone Encounter (Signed)
Message left notifying patient that if it was medication, it would probably be during the day as well and not just at night. I advised her to stop the doxy and see if there is any improvement. I instructed her to call and let us know if she developed a fever after stopping the doxy and that she should be fine without it as long as she doesn't develop one. I instructed her to call with any questions or concerns.

## 2011-01-27 NOTE — Telephone Encounter (Signed)
Called to touch base with patient.  Questions answered.  Has had 7 days doxy.  Recommend stop.  Update [place] if any questions.

## 2011-01-27 NOTE — H&P (Signed)
Veronica Ward, Veronica Ward NO.:  [id number]  MEDICAL RECORD NO.:  [id number]  LOCATION:  2853                         FACILITY:  [facility]  PHYSICIAN:  Betha Loa, MD        DATE OF BIRTH:  10-19-1945  DATE OF ADMISSION:  01/12/2011 DATE OF DISCHARGE:                             HISTORY & PHYSICAL   CHIEF COMPLAINT:  Left index finger infection.  HISTORY OF PRESENT ILLNESS:  Veronica Ward is a 65 year old right-hand dominant white female who works as a professor of Radio producer. She states that on Sunday she began to have problem with her left index finger.  She has had multiple infections at the fingertips in the past from biting her nails.  She usually soaks using salty solution and treat them with Neosporin and they go away.  She poked at which she thought was the abscess with a sterilized pin on Sunday.  It seemed to be going away on Monday.  On Tuesday, she started to have increasing swelling, erythema, and pain in the finger.  She presented to [facility] on Wednesday and was started on Augmentin and Bactrim.  She continued to have pain and swelling in the finger.  She has not had any fevers, chills, or night sweats.  She was referred to me today for further care.  ALLERGIES:  PENICILLIN causes a rash, she thinks.  PAST MEDICAL HISTORY:  None.  PAST SURGICAL HISTORY:  Birthmark removal, tubal ligation, tonsillectomy, lymph node excision for infection, and right knee surgery.  MEDICATIONS:  Vitamins and Folbic.  FAMILY HISTORY:  Noncontributory.  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  She does not smoke and drinks a glass of wine per day.  REVIEW OF SYSTEMS:  A 13-point review of systems is negative.  PHYSICAL EXAMINATION:  GENERAL:  Alert and oriented x3, well developed, well nourished, normal gait pattern. HEAD:  Normocephalic and atraumatic. NECK:  Supple and full range of motion. CHEST:  Regular rate and  rhythm. LUNGS:  Clear to auscultation bilaterally. ABDOMEN:  Nontender and nondistended. EXTREMITIES:  Bilateral upper extremities are intact to sensation and capillary refill in all fingertips.  She can flex and extend the IP joint of her thumb and can cross her fingers.  In the right upper extremity, she has no wounds, no tenderness to palpation.  In the left upper extremity with the exception of the index finger, there are no wounds and no tenderness palpation.  The pad of the index finger is very full, intense feeling.  It is tender to palpation.  There is a small what appears to the healed-over wound at the radial side.  She states this is where she poked at with the needle.  There can be seen some purulence underneath the skin.  She is not tender over the proximal or middle phalanges.  She has mild tenderness to PIP joint and no tenderness at the MP joint.  She can flex and extend the finger with the exception of the DIP joint, which is painful in the pad of the finger. She is exquisitely tender to palpation in the pad of the finger.  There  is no streaking.  There is some swelling in the digit, but is not a fusiform swelling.  She can fully extend.  RADIOGRAPHS:  AP and lateral views of the index finger show no fractures, dislocations, radiopaque foreign bodies, or signs of osteomyelitis..  ASSESSMENT/PLAN:  Left index finger felon.  I discussed Veronica Ward the nature of her condition.  I recommended going to the operating room for irrigation and debridement of the left index finger felon.  Risks, benefits, and alternatives of surgery were discussed including risks of blood loss, infection, damage to nerves, vessels, tendons, ligaments, and bone, failure of procedure, the need for additional procedure, complications with wound healing, continued pain, continued infection, need for repeat irrigation and debridement.  She voiced understanding these risks and elected to proceed.  We  will have the surgery arranged as soon as possible.     Betha Loa, MD     KK/MEDQ  D:  01/12/2011  T:  01/13/2011  Job:  [id number]  Electronically Signed by Betha Loa  on 01/27/2011 04:36:52 PM

## 2011-01-27 NOTE — Telephone Encounter (Signed)
Caller states that phone call was received stating that 'lab results for [facility] Spotted Fever were contradictory' and they are confused at to what this means or to do. Labs results show IgM of 1.07 but do not see notation as to phone call being placed to Pt as this result has not been signed off on by MD.?

## 2011-01-27 NOTE — Op Note (Signed)
[name], [name]:  [id number]  MEDICAL RECORD NO.:  [id number]  LOCATION:  2853                         FACILITY:  [facility]  PHYSICIAN:  Betha Loa, MD        DATE OF BIRTH:  04/17/1946  DATE OF PROCEDURE:  01/12/2011 DATE OF DISCHARGE:                              OPERATIVE REPORT   PREOPERATIVE DIAGNOSIS:  Left index finger felon.  POSTOPERATIVE DIAGNOSIS:  Left index finger felon.  PROCEDURE:  Irrigation and debridement of left index finger felon.  SURGEON:  Betha Loa, MD  ASSISTANT:  None.  ANESTHESIA:  General.  INTRAVENOUS FLUIDS:  Per anesthesia flow sheet.  ESTIMATED BLOOD LOSS:  Minimal.  COMPLICATIONS:  None.  SPECIMENS:  Cultures.  TOURNIQUET TIME:  18 minutes.  DISPOSITION:  Stable to PACU.  INDICATIONS:  Veronica Ward is a 65 year old right-hand-dominant white female who states that on Sunday she began to have some swelling in the left index finger.  She thought this was a small amount of infection and poked it with sterilized needle to try and drainage it.  She thought it was going away on Monday, but on Tuesday it began to get worse.  She presented to primary care on Wednesday and was started on Augmentin and Bactrim.  She had worsening symptoms over the evening.  She presented to me for evaluation today.  She noted no fevers, chills, or night sweats. She had intact sensation and capillary refill in both radial and ulnar sides of the finger.  She has tense, swollen, and painful pad of the finger.  She was mildly tender at the PIP joint and not at the MP joint. She had minimal tenderness volarly over the proximal or middle phalanx. She could move the finger with the exception of distally secondary to discomfort.  There was visible purulence underneath the skin.  I recommended Veronica Ward going to the operating room for irrigation and debridement of the left index finger felon.  The risks, benefits, and alternatives of surgery  were discussed including the risks of blood loss, infection, damage to nerves, vessels, tendons, ligaments, and bone, failure of procedure, the need for additional procedures, complications with wound healing, continued pain, continued infection, and the need for repeat irrigation and debridement.  She voiced understanding these risks and elected to proceed.  OPERATING COURSE:  After being identified preoperatively by myself, the patient and I agreed upon the procedure and site of procedure.  Surgical site was marked.  The risks, benefits, and alternatives of surgery were reviewed and she wished to proceed.  Surgical site was marked.  She was transferred to the operating room and placed in the operating table in supine position with the left upper extremity on an arm board.  General anesthesia was induced by the anesthesiologist.  The left upper extremity was prepped and draped in a normal sterile orthopedic fashion. A surgical pause was performed between surgeons, Anesthesia, and operating room staff and all were in agreement as to the patient,procedure, and site of procedure.  The forearm and upper arm were exsanguinated with an Esmarch bandage.  Tourniquet was inflated to 250 mmHg.  An incision was made at the  radial side of the index finger at the pad.  This was where the wound was.  There was gross purulence expressed.  This was sent for cultures.  There was noted to be delaminated skin over the radial side of the finger and over the pad. This was debrided as it was nonviable.  There was raw tissue underneath. There was an area of patulous tissue at the radial side of the pad. After copious debridement and irrigation of the wound, an incision was made deeper into the pad of the finger.  This was spread across using the hemostats.  There was no gross purulence deeper.  The wound was extended dorsally as it was very boggy on the back of the finger. Again, there was no gross purulence.   The wounds were all copiously irrigated with 1000 mL of sterile saline.  Iodoform packing was then placed into the wounds.  The wounds were dressed with sterile Xeroform. A digital block was performed with 8 mL of 0.25% plain Marcaine.  There was no erythema at the proximal aspect of the finger.  The finger was then wrapped with 4 x 4's and loosely with a Kling and Coban dressing. Tourniquet was deflated at 18 minutes.  The operative drapes were broken down and the patient was awoken from anesthesia safely.  She was transferred back to the stretcher and taken to PACU in stable condition. She will remove the dressing and packing on Sunday and start daily soaks in hypertonic saline.  She will see the therapist in our office starting Monday for therapy and repacking of the limb.  We will continue her on her current antibiotics.  I will give her Percocet 5/325 one to two p.o. q.6 h. p.r.n. pain, dispensed #50.     Betha Loa, MD     KK/MEDQ  D:  01/12/2011  T:  01/13/2011  Job:  [id number]  Electronically Signed by Betha Loa  on 01/27/2011 04:38:12 PM

## 2011-01-27 NOTE — Telephone Encounter (Signed)
Dr Reece Agar called her - I will forward this to him-thanks

## 2011-01-27 NOTE — Telephone Encounter (Signed)
Patient says that for the past 3 nights when she lays her head down her head hurts on the left side from the back to the front at her temples, it take a while for her to go to sleep and then will wake up at 2:00am in so much pain and then will take 2 tylenol, and tries to burry her head under her pillow so that she can put as much pressure on her head to hide the pain and about an hour later she is able to fall back asleep and when she wake up for the day her head is okay. She is asking if the headache could be coming from the doxycyline. Please advise.

## 2011-01-27 NOTE — Telephone Encounter (Signed)
Could possibly be coming from doxycycline although if it was HA from this, would expect more than just at night.  Today is 6th day of treatment, right?  Would recommend stopping to see if improvement noted.  Should be enough as long as stays afebrile.

## 2011-01-30 ENCOUNTER — Telehealth: Payer: Self-pay | Admitting: *Deleted

## 2011-01-30 NOTE — Telephone Encounter (Signed)
Patient notified as instructed by telephone. Pt said had severe h/a last night with questionable neck pain. No fever. No severe h/a today pt said feels like head in fog today. Pt prefers not to go to ER so pt made appt in AM with Dr Milinda Antis. If severe h/a returns with neck pain or fever pt will go to ER tonight.

## 2011-01-30 NOTE — Telephone Encounter (Signed)
Pt is complaining of a headache x several days- hurts in the back of her head, not like the migraines that she gets.  This goes away when she eats but always comes back after about 20 minutes.  She isnt taking anything for the headache, says she doesn't do well with medicines.  Please advise on what she can do.

## 2011-01-30 NOTE — Telephone Encounter (Signed)
Let me know if fever or other symptoms (severe headache with neck or fever warrant ER eval) Try drinking more water  I think it would be safe to take tylenol up to every 4 hours if she needs it  If not improved - please f/u so we can check it out

## 2011-01-31 ENCOUNTER — Encounter: Payer: Self-pay | Admitting: Family Medicine

## 2011-01-31 ENCOUNTER — Ambulatory Visit (INDEPENDENT_AMBULATORY_CARE_PROVIDER_SITE_OTHER): Payer: BC Managed Care – PPO | Admitting: Family Medicine

## 2011-01-31 DIAGNOSIS — R21 Rash and other nonspecific skin eruption: Secondary | ICD-10-CM

## 2011-01-31 DIAGNOSIS — R5383 Other fatigue: Secondary | ICD-10-CM

## 2011-01-31 DIAGNOSIS — R5381 Other malaise: Secondary | ICD-10-CM

## 2011-01-31 DIAGNOSIS — R51 Headache: Secondary | ICD-10-CM

## 2011-01-31 LAB — CBC WITH DIFFERENTIAL/PLATELET
Basophils Absolute: 0 10*3/uL (ref 0.0–0.1)
Basophils Relative: 0.4 % (ref 0.0–3.0)
Eosinophils Absolute: 0.1 10*3/uL (ref 0.0–0.7)
Eosinophils Relative: 0.9 % (ref 0.0–5.0)
HCT: 32.8 % — ABNORMAL LOW (ref 36.0–46.0)
Hemoglobin: 11.3 g/dL — ABNORMAL LOW (ref 12.0–15.0)
Lymphocytes Relative: 17.8 % (ref 12.0–46.0)
Lymphs Abs: 1.1 10*3/uL (ref 0.7–4.0)
MCHC: 34.3 g/dL (ref 30.0–36.0)
MCV: 98.6 fl (ref 78.0–100.0)
Monocytes Absolute: 0.7 10*3/uL (ref 0.1–1.0)
Monocytes Relative: 11.2 % (ref 3.0–12.0)
Neutro Abs: 4.5 10*3/uL (ref 1.4–7.7)
Neutrophils Relative %: 69.7 % (ref 43.0–77.0)
Platelets: 255 10*3/uL (ref 150.0–400.0)
RBC: 3.32 Mil/uL — ABNORMAL LOW (ref 3.87–5.11)
RDW: 13.1 % (ref 11.5–14.6)
WBC: 6.5 10*3/uL (ref 4.5–10.5)

## 2011-01-31 LAB — SEDIMENTATION RATE: Sed Rate: 42 mm/hr — ABNORMAL HIGH (ref 0–22)

## 2011-01-31 NOTE — Patient Instructions (Signed)
Drink fluids - in sips is fine- whatever tastes good to you but avoid coffee and alcohol Keep resting  Tylenol or ibuprofen(with food) are ok for headache  Keep up a healthy diet  If your headache becomes severe or stiff neck/ increasing fever- go to ER and alert [place]  I am checking tick labs today along with cbc and sed rate to rule out other problems and will alert you to results asap Please update me if symptoms change

## 2011-01-31 NOTE — Progress Notes (Signed)
Subjective:    Patient ID: Veronica Ward, female    DOB: Mar 31, 1946, 65 y.o.   MRN: [id number]  HPI Here for headache Pt has had recent felon and finger surgery  ( I and D ) for that  Finished course of augmentin and septra for that  In addn dev a rash - and was put on doxycycline for poss of tick fever -- with indeterminite test for that   The rash is done  Does not remember a tick bite   Is exhausted   Headache started last Thursday - about the time the rash started to clear up  Worse when she lies down - especially at night  Does have hx of mild migraines - in L temple usually  This headache hurt more in low post head and base of neck -- really weird and different from her migraines  Radiated to top of head also  Would wake up all night with it   Used tiger balm and acetominophen and that did not help much  During the day quite a bit better-- especially after she ate -- then would come back again  All day yesterday no headache all day -- then came back at night   Is really trying to hydrate -- sips -- husband thinks not quite enough  Some water and ginger ale   Finger sore but much better overall Temp at max 99 -- and rash   Today a little headache over L temple and a little in back of neck  At one time got a cramp over L mid abdomen and then that got better  No urinary symptoms   Patient Active Problem List  Diagnoses  . BASAL CELL CARCINOMA, FACE  . STRESS REACTION, ACUTE, WITH EMOTIONAL DISTURBANCE  . HEMORRHOIDS  . FIBROCYSTIC BREAST DISEASE  . PSORIASIS  . SEBORRHEIC KERATOSIS  . URTICARIA  . TOE PAIN  . OSTEOPENIA  . SLEEP DISORDER  . MALAISE AND FATIGUE  . HEADACHE  . BASAL CELL CARCINOMA, FACE  . Encounter for medication monitoring  . Weight loss  . Screening for lipoid disorders  . Diabetes mellitus screening  . Rash and nonspecific skin eruption   Past Medical History  Diagnosis Date  . Hx of basal cell carcinoma     face  . Diffuse cystic  mastopathy   . Headache   . Unspecified hemorrhoids without mention of complication   . Other malaise and fatigue   . Disorder of bone and cartilage, unspecified   . Other psoriasis   . Other seborrheic keratosis   . Sleep disturbance, unspecified   . Predominant disturbance of emotions   . Pain in limb   . Urticaria, unspecified   . History of shingles   . Anxiety   . Anemia   . Dysautonomia     mild   Past Surgical History  Procedure Date  . Cervical conization w/bx   . Cervical cancer 1973  . Breast biopsy 2001    benign  . Tubal ligation   . Knee surgery     right  . Dexa 03/2002    oseopenia  . Colonoscopy 12/04    int. hemorrhoids  . Dexa 07/2004    decreased BMD, osteopenia  . Dexa 02/2007    Osteopenia  . Breast lumpectomy     left underarm  . Birthmark removal childhood  . Dexa 9/10    Osteopenia slightly worse  . Felon i&d 12/2010    Dr. Merlyn Lot,  I&D in OR (L index finger)   History  Substance Use Topics  . Smoking status: Former Smoker    Quit date: 07/25/1963  . Smokeless tobacco: Never Used  . Alcohol Use: Yes     1 glass daily   Family History  Problem Relation Age of Onset  . Multiple myeloma Father   . Coronary artery disease Father   . Transient ischemic attack Mother   . Lupus Maternal Aunt   . Prostate cancer Maternal Uncle   . Cancer Maternal Grandfather   . Multiple sclerosis Brother   . Breast cancer      Aunt x2   No Known Allergies Current Outpatient Prescriptions on File Prior to Visit  Medication Sig Dispense Refill  . Calcium Carbonate-Vit D-Min 1200-1000 MG-UNIT CHEW       . cholecalciferol (VITAMIN D) 1000 UNITS tablet Take 1,000 Units by mouth daily.        . fish oil-omega-3 fatty acids 1000 MG capsule Take 2 g by mouth daily.        . folic acid-pyridoxine-cyancobalamin (FOLBIC) 2.5-25-2 MG TABS Take 1 tablet by mouth daily.  30 each  11  . Lactobacillus (ACIDOPHILUS PO) Take by mouth as directed.        . Multiple  Vitamin (MULTIVITAMIN) tablet Take 1 tablet by mouth daily.        . Ascorbic Acid (VITAMIN C CR PO) Take by mouth as directed.        Marland Kitchen BIOTIN PO Take 1 tablet by mouth daily.        Marland Kitchen CALCIUM PO Take by mouth daily.       Marland Kitchen doxycycline (VIBRA-TABS) 100 MG tablet Take 1 tablet (100 mg total) by mouth 2 (two) times daily.  6 tablet  0  . HYDROcodone-acetaminophen (VICODIN) 5-500 MG per tablet Take 1 tablet by mouth every 6 (six) hours as needed for pain (watch out for sedation ).  20 tablet  0  . ibuprofen (ADVIL,MOTRIN) 200 MG tablet Take 200 mg by mouth every 6 (six) hours as needed.        Marland Kitchen VITAMIN E PO Take by mouth as directed.              Review of Systems Review of Systems  Constitutional: Negative for fever, appetite change, fatigue and unexpected weight change.  Eyes: Negative for pain and visual disturbance.  Respiratory: Negative for cough and shortness of breath.   Cardiovascular: Negative. For cp or palp or sob  Gastrointestinal: Negative for nausea, diarrhea and constipation. Pos for one occas of abd cramping   Genitourinary: Negative for urgency and frequency.  Skin: Negative for pallor. or rash (rash is resolved ) Neurological: Negative for weakness, light-headedness, numbness and pos for headache Hematological: Negative for adenopathy. Does not bruise/bleed easily.  Psychiatric/Behavioral: Negative for dysphoric mood. The patient is not nervous/anxious.          Objective:   Physical Exam  Constitutional: She appears well-developed and well-nourished. No distress.  HENT:  Head: Normocephalic and atraumatic.  Right Ear: External ear normal.  Left Ear: External ear normal.  Nose: Nose normal.  Mouth/Throat: Oropharynx is clear and moist.       No sinus tenderness  Eyes: Conjunctivae and EOM are normal. Pupils are equal, round, and reactive to light.       Fundi grossly WNL bilat   Neck: Normal range of motion. Neck supple. No JVD present. Carotid bruit is not  present. No thyromegaly  present.  Cardiovascular: Normal rate, regular rhythm, normal heart sounds and intact distal pulses.   Pulmonary/Chest: Effort normal and breath sounds normal. No respiratory distress. She has no wheezes. She has no rales.  Abdominal: Soft. Bowel sounds are normal. She exhibits no distension and no mass. There is no tenderness.  Musculoskeletal: Normal range of motion. She exhibits no edema and no tenderness.       Tender post neck muscles - with completely normal rom without pain    Lymphadenopathy:    She has no cervical adenopathy.  Neurological: She is alert. She has normal strength and normal reflexes. No cranial nerve deficit or sensory deficit. She displays a negative Romberg sign. Coordination and gait normal.  Skin: Skin is warm and dry. No rash noted. No erythema. No pallor.  Psychiatric: She has a normal mood and affect. Judgment and thought content normal.          Assessment & Plan:

## 2011-02-01 LAB — ROCKY MTN SPOTTED FVR AB, IGG-BLOOD: RMSF IgG: 0.82 IV — ABNORMAL HIGH

## 2011-02-01 LAB — B. BURGDORFI ANTIBODIES: B burgdorferi Ab IgG+IgM: 0.44 {ISR}

## 2011-02-02 ENCOUNTER — Emergency Department (HOSPITAL_COMMUNITY): Payer: BC Managed Care – PPO

## 2011-02-02 ENCOUNTER — Telehealth: Payer: Self-pay

## 2011-02-02 ENCOUNTER — Emergency Department (HOSPITAL_COMMUNITY)
Admission: EM | Admit: 2011-02-02 | Discharge: 2011-02-02 | Disposition: A | Discharge status: Home / Self Care | Payer: BC Managed Care – PPO | Attending: Emergency Medicine | Admitting: Emergency Medicine

## 2011-02-02 DIAGNOSIS — M542 Cervicalgia: Secondary | ICD-10-CM | POA: Insufficient documentation

## 2011-02-02 DIAGNOSIS — R42 Dizziness and giddiness: Secondary | ICD-10-CM | POA: Insufficient documentation

## 2011-02-02 DIAGNOSIS — H53149 Visual discomfort, unspecified: Secondary | ICD-10-CM | POA: Insufficient documentation

## 2011-02-02 DIAGNOSIS — M549 Dorsalgia, unspecified: Secondary | ICD-10-CM | POA: Insufficient documentation

## 2011-02-02 DIAGNOSIS — R51 Headache: Secondary | ICD-10-CM | POA: Insufficient documentation

## 2011-02-02 LAB — CBC
HCT: 34 % — ABNORMAL LOW (ref 36.0–46.0)
Hemoglobin: 11.5 g/dL — ABNORMAL LOW (ref 12.0–15.0)
MCH: 32.4 pg (ref 26.0–34.0)
MCHC: 33.8 g/dL (ref 30.0–36.0)
MCV: 95.8 fL (ref 78.0–100.0)
Platelets: 266 10*3/uL (ref 150–400)
RBC: 3.55 MIL/uL — ABNORMAL LOW (ref 3.87–5.11)
RDW: 12.8 % (ref 11.5–15.5)
WBC: 4.6 10*3/uL (ref 4.0–10.5)

## 2011-02-02 LAB — URINALYSIS, ROUTINE W REFLEX MICROSCOPIC
Bilirubin Urine: NEGATIVE
Glucose, UA: NEGATIVE mg/dL
Hgb urine dipstick: NEGATIVE
Ketones, ur: NEGATIVE mg/dL
Nitrite: NEGATIVE
Protein, ur: NEGATIVE mg/dL
Specific Gravity, Urine: 1.012 (ref 1.005–1.030)
Urobilinogen, UA: 0.2 mg/dL (ref 0.0–1.0)
pH: 7 (ref 5.0–8.0)

## 2011-02-02 LAB — DIFFERENTIAL
Basophils Absolute: 0 10*3/uL (ref 0.0–0.1)
Basophils Relative: 1 % (ref 0–1)
Eosinophils Absolute: 0.1 10*3/uL (ref 0.0–0.7)
Eosinophils Relative: 2 % (ref 0–5)
Lymphocytes Relative: 34 % (ref 12–46)
Lymphs Abs: 1.6 10*3/uL (ref 0.7–4.0)
Monocytes Absolute: 0.6 10*3/uL (ref 0.1–1.0)
Monocytes Relative: 13 % — ABNORMAL HIGH (ref 3–12)
Neutro Abs: 2.3 10*3/uL (ref 1.7–7.7)
Neutrophils Relative %: 51 % (ref 43–77)

## 2011-02-02 LAB — COMPREHENSIVE METABOLIC PANEL
ALT: 72 U/L — ABNORMAL HIGH (ref 0–35)
AST: 37 U/L (ref 0–37)
Albumin: 3.4 g/dL — ABNORMAL LOW (ref 3.5–5.2)
Alkaline Phosphatase: 84 U/L (ref 39–117)
BUN: 13 mg/dL (ref 6–23)
CO2: 29 mEq/L (ref 19–32)
Calcium: 9.3 mg/dL (ref 8.4–10.5)
Chloride: 102 mEq/L (ref 96–112)
Creatinine, Ser: <0.47 mg/dL — ABNORMAL LOW (ref 0.50–1.10)
Glucose, Bld: 94 mg/dL (ref 70–99)
Potassium: 3.9 mEq/L (ref 3.5–5.1)
Sodium: 138 mEq/L (ref 135–145)
Total Bilirubin: 0.1 mg/dL — ABNORMAL LOW (ref 0.3–1.2)
Total Protein: 7.1 g/dL (ref 6.0–8.3)

## 2011-02-02 LAB — URINE MICROSCOPIC-ADD ON

## 2011-02-02 NOTE — Assessment & Plan Note (Signed)
Resolved  repeating tick labs in light of headache May have been med allergy

## 2011-02-02 NOTE — Telephone Encounter (Signed)
Spoke with Veronica Ward at [facility] ER and she instructed me to fax info to [phone] with cover sheet Katie's attention for pt to be evaluated by ER doctor.

## 2011-02-02 NOTE — Telephone Encounter (Signed)
Patient notified as instructed by telephone. Pt said her headache today is not as severe as the continuous headache pt had yesterday but she said the h/a is bad enough at times she feels like she is going to faint (pt has not fainted though) and pt states feels like heavy cloud in head and head throbs. Pt said headache is relieved for a short time when she eats. Pt and husband who was on the other line at home said if Dr Milinda Antis recommends that pt go to ER they will take pt to [facility] ER and they will be at ER in one to two hours. I will call [facility] and let them know pt is coming and that I have faxed last notes and labs to ER at [phone].

## 2011-02-02 NOTE — Assessment & Plan Note (Signed)
Recent surgery and infx and now headache Lab today

## 2011-02-02 NOTE — Telephone Encounter (Signed)
Message copied by Patience Musca on Thu Feb 02, 2011  2:14 PM ------      Message from: Roxy Manns A      Created: Wed Feb 01, 2011  8:33 AM       Please let pt know that wbc is not elevated (that would indicate bacterial infection)       Very slight anemia -- poss from recent illness and not eating for a while      Sed rate is elevated (indicated inflammation- not a surprise given recent illness and surgery)      I am still waiting on the tick fever labs-- and will update her as soon as I get them      Please update me with how she is feeling -thanks

## 2011-02-02 NOTE — Telephone Encounter (Signed)
Thank you for doing that -- I will be on the watch for ER notes

## 2011-02-02 NOTE — Assessment & Plan Note (Signed)
After stressful medical course of finger surgery/ infection and rash  No hx of tick bite but last rmsf test was equivicol  Headache is improved today and may very well be from dehydration/ change in diet and sleep/ and exhaustion in pt with hx of migraines No meningeal signs  Will repeat tick labs and cbc and sed rate and update pt  Adv to update and seek care in ER if worse ha/fever or neck stiffness

## 2011-02-06 ENCOUNTER — Telehealth: Payer: Self-pay | Admitting: *Deleted

## 2011-02-06 NOTE — Telephone Encounter (Signed)
Lets continue the prednisone and get her ref to a specialist to see if it is temporal arteritis How much prednisone was she on ? Let me know and I will call that in and we will disc ref when I see her on Wednesday Let me know if any new symptoms It is hard to sleep on prednisone - can try benadryl or I can call in a sleeping aid short term- up to her

## 2011-02-06 NOTE — Telephone Encounter (Signed)
Patient notified as instructed by telephone. Pt said she is taking Prednisone 20mg  one tablet by mouth twice a day. Pt said she has plenty of Prednisone 20mg  and will continue until sees Dr Milinda Antis on 02/08/11.Pt wants to try Benadryl 25mg  OTC taking one tab at bedtime if Benadryl does not help pt will call back for prescription sleep aid.

## 2011-02-06 NOTE — Telephone Encounter (Signed)
Pt states she is exhausted, not sleeping.  Her headache went away for 2 days with the prednisone but now it is back.  She has appt to see you on Wednesday but she is asking what to do until then.  Doctors at hospital decided not to do biopsy for arteritis because they said it wasn't necessary.

## 2011-02-08 ENCOUNTER — Encounter: Payer: Self-pay | Admitting: Family Medicine

## 2011-02-08 ENCOUNTER — Telehealth: Payer: Self-pay | Admitting: Family Medicine

## 2011-02-08 ENCOUNTER — Ambulatory Visit (INDEPENDENT_AMBULATORY_CARE_PROVIDER_SITE_OTHER): Payer: BC Managed Care – PPO | Admitting: Family Medicine

## 2011-02-08 VITALS — BP 98/62 | HR 84 | Temp 98.9°F | Ht 62.75 in | Wt 101.8 lb

## 2011-02-08 DIAGNOSIS — R5381 Other malaise: Secondary | ICD-10-CM

## 2011-02-08 DIAGNOSIS — R5383 Other fatigue: Secondary | ICD-10-CM

## 2011-02-08 DIAGNOSIS — R51 Headache: Secondary | ICD-10-CM

## 2011-02-08 MED ORDER — PREDNISONE 20 MG PO TABS: ORAL_TABLET | ORAL | Status: DC

## 2011-02-08 NOTE — Patient Instructions (Signed)
Decrease prednisone to 11/2 pills each am for 1 week  Then 1 pill once daily in am for 1 week Then 1/2 pill once daily in am for 1 week Then stop it  If you need benadryl try the benadryl at 12.5 mg  Try to very gradually increase your activity  We will do a referral to the headache clinic at check out  If you are interested in counseling in the future for stress - please let me know

## 2011-02-08 NOTE — Assessment & Plan Note (Addendum)
persistant but steadily imp with prednisone  Spoke with opthy and susp for TA is low  susp for tick fever is low  Will taper prednisone now  Disc headache lifestyle habits Disc stressors/ fluid intake -- and will ref to ha clinic for eval (do suspect transformation of her migraines) Rev ER notes today >25 min spent with face to face with patient, >50% counseling and/or coordinating care

## 2011-02-08 NOTE — Telephone Encounter (Signed)
Got call from Dr Dellie Burns - opthy about pt  She went in for headaches - with the poss of TA and sed rate of 40 also elevated crp from ER At office exam is nl - no temporal tenderness/ nl pulses and also nl CRp of 2.9 He decided temporal arteritis is unlikely and decided not to do TA bx  Thought there may be some psychological factors/ poss migraine  She will be following up with me

## 2011-02-08 NOTE — Progress Notes (Signed)
Subjective:    Patient ID: Veronica Ward, female    DOB: 02/10/1946, 65 y.o.   MRN: [id number]  HPI Headache intensity has gone down to .5 out of 10  Prednisone is the 6th day (20 bid) Took benadryl one night -- and felt hung over until dinner the next day Took 1 last night  Today does not have a headache -- but has a head discomfort that occ becomes an ache (L temple area) Neck generally feels better   Is dragging in generally  Waves of exhaustion -- and feels most comfortable in bed  Still exhausted for 2 weeks  Is still improving when she eats    Has begun to make simple dinners, however    She improved in ER with cocktail - of phenergan and benadryl and prednisone (she thinks)   Some traveling planned  Is cancelling a trip this weekend   Lots of stress - is thinking about a phased retirement  Is anxious about school starting   Patient Active Problem List  Diagnoses  . BASAL CELL CARCINOMA, FACE  . STRESS REACTION, ACUTE, WITH EMOTIONAL DISTURBANCE  . HEMORRHOIDS  . FIBROCYSTIC BREAST DISEASE  . PSORIASIS  . SEBORRHEIC KERATOSIS  . URTICARIA  . TOE PAIN  . OSTEOPENIA  . SLEEP DISORDER  . MALAISE AND FATIGUE  . HEADACHE  . BASAL CELL CARCINOMA, FACE  . Encounter for medication monitoring  . Weight loss  . Screening for lipoid disorders  . Diabetes mellitus screening  . Rash and nonspecific skin eruption   Past Medical History  Diagnosis Date  . Hx of basal cell carcinoma     face  . Diffuse cystic mastopathy   . Headache   . Unspecified hemorrhoids without mention of complication   . Other malaise and fatigue   . Disorder of bone and cartilage, unspecified   . Other psoriasis   . Other seborrheic keratosis   . Sleep disturbance, unspecified   . Predominant disturbance of emotions   . Pain in limb   . Urticaria, unspecified   . History of shingles   . Anxiety   . Anemia   . Dysautonomia     mild   Past Surgical History  Procedure Date  .  Cervical conization w/bx   . Cervical cancer 1973  . Breast biopsy 2001    benign  . Tubal ligation   . Knee surgery     right  . Dexa 03/2002    oseopenia  . Colonoscopy 12/04    int. hemorrhoids  . Dexa 07/2004    decreased BMD, osteopenia  . Dexa 02/2007    Osteopenia  . Breast lumpectomy     left underarm  . Birthmark removal childhood  . Dexa 9/10    Osteopenia slightly worse  . Felon i&d 12/2010    Dr. Merlyn Lot, I&D in OR (L index finger)   History  Substance Use Topics  . Smoking status: Former Smoker    Quit date: 07/25/1963  . Smokeless tobacco: Never Used  . Alcohol Use: Yes     1 glass daily   Family History  Problem Relation Age of Onset  . Multiple myeloma Father   . Coronary artery disease Father   . Transient ischemic attack Mother   . Lupus Maternal Aunt   . Prostate cancer Maternal Uncle   . Cancer Maternal Grandfather   . Multiple sclerosis Brother   . Breast cancer      Aunt x2  No Known Allergies Current Outpatient Prescriptions on File Prior to Visit  Medication Sig Dispense Refill  . acetaminophen (TYLENOL) 500 MG tablet Take 500 mg by mouth every 6 (six) hours as needed.        . Ascorbic Acid (VITAMIN C CR PO) Take by mouth as directed.        Marland Kitchen BIOTIN PO Take 1 tablet by mouth daily.        Marland Kitchen CALCIUM PO Take by mouth daily.       . cholecalciferol (VITAMIN D) 1000 UNITS tablet Take 1,000 Units by mouth daily.        . fish oil-omega-3 fatty acids 1000 MG capsule Take 2 g by mouth daily.        . folic acid-pyridoxine-cyancobalamin (FOLBIC) 2.5-25-2 MG TABS Take 1 tablet by mouth daily.  30 each  11  . ibuprofen (ADVIL,MOTRIN) 200 MG tablet Take 200 mg by mouth every 6 (six) hours as needed.        . Lactobacillus (ACIDOPHILUS PO) Take by mouth as directed.        . Multiple Vitamin (MULTIVITAMIN) tablet Take 1 tablet by mouth daily.        . Calcium Carbonate-Vit D-Min 1200-1000 MG-UNIT CHEW       . doxycycline (VIBRA-TABS) 100 MG tablet  Take 1 tablet (100 mg total) by mouth 2 (two) times daily.  6 tablet  0  . HYDROcodone-acetaminophen (VICODIN) 5-500 MG per tablet Take 1 tablet by mouth every 6 (six) hours as needed for pain (watch out for sedation ).  20 tablet  0  . VITAMIN E PO Take by mouth as directed.              Review of Systems Review of Systems  Constitutional: Negative for fever, appetite change, and unexpected weight change. pos for fatigue  Eyes: Negative for pain and visual disturbance.  Respiratory: Negative for cough and shortness of breath.   Cardiovascular: Negative.  for cp or sob or palpitations Gastrointestinal: Negative for nausea, diarrhea and constipation.  Genitourinary: Negative for urgency and frequency.  Skin: Negative for pallor. or rash  Neurological: Negative for weakness, light-headedness, numbness and pos for headaches  Hematological: Negative for adenopathy. Does not bruise/bleed easily.  Psychiatric/Behavioral: Negative for dysphoric mood. The patient is not nervous/anxious.          Objective:   Physical Exam  Constitutional: She appears well-developed and well-nourished. No distress.  HENT:  Head: Normocephalic and atraumatic.  Right Ear: External ear normal.  Left Ear: External ear normal.  Nose: Nose normal.  Mouth/Throat: Oropharynx is clear and moist.       No sinus or temporal tenderness noted   Eyes: Conjunctivae and EOM are normal. Pupils are equal, round, and reactive to light.  Neck: Normal range of motion. Neck supple. No JVD present. Carotid bruit is not present. No thyromegaly present.  Cardiovascular: Normal rate, regular rhythm, normal heart sounds and intact distal pulses.   Pulmonary/Chest: Effort normal and breath sounds normal. No respiratory distress. She has no wheezes.  Abdominal: Bowel sounds are normal.  Musculoskeletal: She exhibits no edema and no tenderness.       Salley Hews is healed - with some remaining dry skin   Lymphadenopathy:    She has no  cervical adenopathy.  Neurological: She is alert. She has normal strength and normal reflexes. No cranial nerve deficit or sensory deficit. Coordination normal.  Skin: Skin is warm and dry. No rash noted.  No erythema. No pallor.  Psychiatric:       Pt gets anxious and almost tearful when discussing anxiety about starting school again           Assessment & Plan:

## 2011-02-08 NOTE — Assessment & Plan Note (Signed)
Persists but slowly imp Disc recent health problems and also stressors Will attempt to very gradually increase her activity  Rev labs and nutrition  Also disc idea of counseling which I think would be smart Pt is interested in doctors note for some work limitations if not improving by the time school starts

## 2011-02-10 ENCOUNTER — Telehealth: Payer: Self-pay | Admitting: *Deleted

## 2011-02-10 NOTE — Telephone Encounter (Signed)
Patient says that she didn't have a headache on wed, but started to come back on Thursday and now it is back to a 5 on the pain scale. She is asking if you want her to continue taking 1 1/2 pill or prednisone. Please advise.

## 2011-02-10 NOTE — Telephone Encounter (Signed)
Give it today and tomorrow on the 11/2 and if still having headache then increase back on Sunday and update me on Monday  Update me also if other symptoms -=thanks

## 2011-02-10 NOTE — Telephone Encounter (Signed)
Patient notified as instructed by telephone. 

## 2011-02-13 ENCOUNTER — Telehealth: Payer: Self-pay | Admitting: *Deleted

## 2011-02-13 NOTE — Telephone Encounter (Signed)
Patient called to let you know that she had a rough start on Saturday morning with her headache and also feeling out there with taking the benadryl. She says that on Sunday she was only having just a little pressure on the left side of her head, but was feeling much better. She says that she does feel exhausted, but feels like this is head in the right direction with getting this all cleared up.

## 2011-02-13 NOTE — Telephone Encounter (Signed)
Patient notified as instructed by telephone. 

## 2011-02-13 NOTE — Telephone Encounter (Signed)
Thanks for the update - that is reassuring  Keep me updated of any changes

## 2011-02-20 ENCOUNTER — Telehealth: Payer: Self-pay | Admitting: *Deleted

## 2011-02-20 NOTE — Telephone Encounter (Signed)
Patient notified as instructed by telephone. 

## 2011-02-20 NOTE — Telephone Encounter (Signed)
Patient called to give you an updated. She says that she is now down to one prednisone per day. She has completely stopped taking the benadryl because it was making her feel groggy spaced out. She says that the headache is not really a headache anymore, but more of a head discomfort. She says that she is only sleeping about 4 to 5 hrs per night at the most, but is starting to feel more energized. This morning when she got up she was able to go out for a walk for about , but it did completely exhaust her. She has also cut out all caffeine, trying to only drink decaf coffee so that she can maybe sleep better at night. She just wanted to see if you think she is on the right track. Please advise.

## 2011-02-20 NOTE — Telephone Encounter (Signed)
Sounds like she is doing better When able cut prednisone to 1/2 pill daily - for 1-2 weeks and then stop I think sleep will improve more then Continue gradually increasing activity Keep me updated  Thanks

## 2011-02-21 ENCOUNTER — Telehealth: Payer: Self-pay | Admitting: *Deleted

## 2011-02-21 NOTE — Telephone Encounter (Signed)
Patient notified as instructed by telephone. Pt said hands still feel tingling and heavy also not sleeping well at night and pt is getting anxious because school starts 03/09/11. Pt scheduled appt to see Dr Milinda Antis 02/24/11 at 10:15am.

## 2011-02-21 NOTE — Telephone Encounter (Signed)
Thanks for the update - if the hands get worse/ do not improve or if the weakness/ heaviness extend to other areas please follow up

## 2011-02-21 NOTE — Telephone Encounter (Signed)
Patient called to say that she forgot to mention yesterday another problem that she is having. Patient stated that her hands feel very heavy and it feels like stones dangling from her wrist. Patient stated that she started having this problem on Friday or Saturday. Patient stated that yesterday she had a good day, but slept badly last night.

## 2011-02-24 ENCOUNTER — Ambulatory Visit (INDEPENDENT_AMBULATORY_CARE_PROVIDER_SITE_OTHER): Payer: BC Managed Care – PPO | Admitting: Family Medicine

## 2011-02-24 ENCOUNTER — Encounter: Payer: Self-pay | Admitting: Family Medicine

## 2011-02-24 VITALS — BP 106/70 | HR 80 | Temp 98.4°F | Ht 62.75 in | Wt 100.5 lb

## 2011-02-24 DIAGNOSIS — R51 Headache: Secondary | ICD-10-CM

## 2011-02-24 DIAGNOSIS — R5381 Other malaise: Secondary | ICD-10-CM

## 2011-02-24 DIAGNOSIS — R4589 Other symptoms and signs involving emotional state: Secondary | ICD-10-CM

## 2011-02-24 DIAGNOSIS — R5383 Other fatigue: Secondary | ICD-10-CM

## 2011-02-24 NOTE — Patient Instructions (Signed)
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

## 2011-02-24 NOTE — Progress Notes (Signed)
Subjective:    Patient ID: Veronica Ward, female    DOB: July 09, 1946, 65 y.o.   MRN: [id number]  HPI Ongoing f/u of fatigue and malaise   Her fatigue is getting slowly better  Wakes up a little more energetic  Walked one day for 5 minutes  Then 10 minutes - felt ok  But after that too tired  Is able to read and do some computer work   Headache is occasional -- much better than it was - is discomfort instead of pain  Is likely ready to get off of the 1/2 prednisone ?  Has appt at the headache clinic   Still not sleeping -- wakes up at 5 no matter when she goes to bed  Stopped all the benadryl - too sleepy  Down to 1/2 prednisone for headache  Drinking decaf coffee in am -- getting more fluids (trying hard to do that )  Eating a lot of hummus  Wt is fairly stable   ? anx with school starting - 2 weeks from now will have to go back Very worried about working very long days  Cannot imagine being able to do it  Is she panicking about school starting  Thinks it is time to retire -- phasing out next year  Cannot afford to quit now   Wants to follow up just before school starts    Hands feel tingly and heavy- that is new  Hand doctor said exam ok  That is better now   Sex drive is down - not a surprise - given feelings of stress and fatigue   Patient Active Problem List  Diagnoses  . BASAL CELL CARCINOMA, FACE  . STRESS REACTION, ACUTE, WITH EMOTIONAL DISTURBANCE  . HEMORRHOIDS  . FIBROCYSTIC BREAST DISEASE  . PSORIASIS  . SEBORRHEIC KERATOSIS  . URTICARIA  . TOE PAIN  . OSTEOPENIA  . SLEEP DISORDER  . MALAISE AND FATIGUE  . HEADACHE  . BASAL CELL CARCINOMA, FACE  . Encounter for medication monitoring  . Weight loss  . Screening for lipoid disorders  . Diabetes mellitus screening  . Rash and nonspecific skin eruption   Past Medical History  Diagnosis Date  . Hx of basal cell carcinoma     face  . Diffuse cystic mastopathy   . Headache   . Unspecified  hemorrhoids without mention of complication   . Other malaise and fatigue   . Disorder of bone and cartilage, unspecified   . Other psoriasis   . Other seborrheic keratosis   . Sleep disturbance, unspecified   . Predominant disturbance of emotions   . Pain in limb   . Urticaria, unspecified   . History of shingles   . Anxiety   . Anemia   . Dysautonomia     mild   Past Surgical History  Procedure Date  . Cervical conization w/bx   . Cervical cancer 1973  . Breast biopsy 2001    benign  . Tubal ligation   . Knee surgery     right  . Dexa 03/2002    oseopenia  . Colonoscopy 12/04    int. hemorrhoids  . Dexa 07/2004    decreased BMD, osteopenia  . Dexa 02/2007    Osteopenia  . Breast lumpectomy     left underarm  . Birthmark removal childhood  . Dexa 9/10    Osteopenia slightly worse  . Felon i&d 12/2010    Dr. Merlyn Lot, I&D in OR (L index finger)  History  Substance Use Topics  . Smoking status: Former Smoker    Quit date: 07/25/1963  . Smokeless tobacco: Never Used  . Alcohol Use: Yes     1 glass daily   Family History  Problem Relation Age of Onset  . Multiple myeloma Father   . Coronary artery disease Father   . Transient ischemic attack Mother   . Lupus Maternal Aunt   . Prostate cancer Maternal Uncle   . Cancer Maternal Grandfather   . Multiple sclerosis Brother   . Breast cancer      Aunt x2   No Known Allergies Current Outpatient Prescriptions on File Prior to Visit  Medication Sig Dispense Refill  . Ascorbic Acid (VITAMIN C CR PO) Take by mouth as directed.        Marland Kitchen BIOTIN PO Take 1 tablet by mouth daily.        . Calcium Carbonate-Vit D-Min 1200-1000 MG-UNIT CHEW       . cholecalciferol (VITAMIN D) 1000 UNITS tablet Take 1,000 Units by mouth daily.        . Coenzyme Q10 (COQ10 PO) Take by mouth as directed.        . fish oil-omega-3 fatty acids 1000 MG capsule Take 2 g by mouth daily.        . folic acid-pyridoxine-cyancobalamin (FOLBIC) 2.5-25-2  MG TABS Take 1 tablet by mouth daily.  30 each  11  . Lactobacillus (ACIDOPHILUS PO) Take by mouth as directed.        . Multiple Vitamin (MULTIVITAMIN) tablet Take 1 tablet by mouth daily.        Marland Kitchen acetaminophen (TYLENOL) 500 MG tablet Take 500 mg by mouth every 6 (six) hours as needed.        . diphenhydrAMINE (BENADRYL) 25 mg capsule Take 25 mg by mouth every 6 (six) hours as needed.        Marland Kitchen HYDROcodone-acetaminophen (VICODIN) 5-500 MG per tablet Take 1 tablet by mouth every 6 (six) hours as needed for pain (watch out for sedation ).  20 tablet  0  . ibuprofen (ADVIL,MOTRIN) 200 MG tablet Take 200 mg by mouth every 6 (six) hours as needed.        . predniSONE (DELTASONE) 20 MG tablet Take 10 mg by mouth daily.       Marland Kitchen VITAMIN E PO Take by mouth as directed.             Review of Systems Review of Systems  Constitutional: Negative for fever, appetite change, and unexpected weight change. pos for fatigue  Eyes: Negative for pain and visual disturbance.  Respiratory: Negative for cough and shortness of breath.   Cardiovascular: Negative.  For cp or sob Gastrointestinal: Negative for nausea, diarrhea and constipation.  Genitourinary: Negative for urgency and frequency.  Skin: Negative for pallor.  Neurological: Negative for weakness, light-headedness, numbness and pos for headaches  Hematological: Negative for adenopathy. Does not bruise/bleed easily.  Psychiatric/Behavioral: pos for some feelings of depression and anxiety , no SI        Objective:   Physical Exam  Constitutional: She appears well-developed and well-nourished. No distress.       Pt is talking slowly - wearing sunglasses  HENT:  Head: Normocephalic and atraumatic.  Mouth/Throat: Oropharynx is clear and moist.       No temporal or sinus tenderness   Eyes: EOM are normal. Pupils are equal, round, and reactive to light.  Neck: Normal range of motion. Neck  supple. No JVD present. Carotid bruit is not present. No  thyromegaly present.  Cardiovascular: Normal rate, regular rhythm, normal heart sounds and intact distal pulses.   Pulmonary/Chest: Effort normal and breath sounds normal. No respiratory distress. She has no wheezes.  Musculoskeletal: She exhibits no edema and no tenderness.  Lymphadenopathy:    She has no cervical adenopathy.  Neurological: She is alert. She has normal reflexes. No cranial nerve deficit. Coordination normal.       No tremor   Skin: Skin is warm and dry. No rash noted. No erythema. No pallor.  Psychiatric:       Pt seems anxious and somewhat down  Talkative/ good eye contact and comm skills disusses stressors freely Almost tearful at times           Assessment & Plan:

## 2011-02-26 NOTE — Assessment & Plan Note (Signed)
Is milder but still unilateral and persistent Plan to stop the 5 mg prednisone  Avoid analgesics if possible  F/u with headache clinic as planned

## 2011-02-26 NOTE — Assessment & Plan Note (Signed)
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

## 2011-02-26 NOTE — Assessment & Plan Note (Signed)
Ongoing after finger surgery and very slowly improving  Also much stress surrounding work  Will see counselor as planned and continue to watch carefully  May end up needing note to start work late - will f/u before school starts

## 2011-03-06 ENCOUNTER — Encounter: Payer: Self-pay | Admitting: Family Medicine

## 2011-03-06 ENCOUNTER — Ambulatory Visit (INDEPENDENT_AMBULATORY_CARE_PROVIDER_SITE_OTHER): Payer: BC Managed Care – PPO | Admitting: Family Medicine

## 2011-03-06 DIAGNOSIS — R51 Headache: Secondary | ICD-10-CM

## 2011-03-06 DIAGNOSIS — R5381 Other malaise: Secondary | ICD-10-CM

## 2011-03-06 DIAGNOSIS — R4589 Other symptoms and signs involving emotional state: Secondary | ICD-10-CM

## 2011-03-06 DIAGNOSIS — R5383 Other fatigue: Secondary | ICD-10-CM

## 2011-03-06 NOTE — Patient Instructions (Signed)
We will refer you for infectious disease specialist at check out  Keep trying some exercise little by little  Continue follow up with headache clinic  Let me know how work goes and update me if you worsen

## 2011-03-06 NOTE — Progress Notes (Signed)
Subjective:    Patient ID: Veronica Ward, female    DOB: [birth year], 65 y.o.   MRN: [id number]  HPI Here for f/u of stress reaction and fatigue and insomnia and headache  Is overall having a bad morning   Fatigue -- previously had "waves" of exhaustion  Now that no longer happens  But does feel tired when she gets up  Sleeps 5-7 hours - wakes up frequently though the night - and can now go back to sleep Wakes up at 5 and - then hard to get back to sleep    Was ref to counselor Supposed to see counselor next Wednesday    Went to the headache clinic  Put her on gabapentin  Will go up to 200 and then 300  Not really working at low dose now    Husband is here today  He thinks she is a little bit better but hard to say overall  2 months  He thinks she is not the same person in general   Met with someone from work -- and did get rid of some of her responsibilities  Did not seem to make her feel better   Is up to walking for 12 minutes and pushing herself to get more exercise   She wants to try to start working  A love/ hate relationship for work  This could make her a bit depressed   Patient Active Problem List  Diagnoses  . BASAL CELL CARCINOMA, FACE  . STRESS REACTION, ACUTE, WITH EMOTIONAL DISTURBANCE  . HEMORRHOIDS  . FIBROCYSTIC BREAST DISEASE  . PSORIASIS  . SEBORRHEIC KERATOSIS  . URTICARIA  . TOE PAIN  . OSTEOPENIA  . SLEEP DISORDER  . MALAISE AND FATIGUE  . HEADACHE  . BASAL CELL CARCINOMA, FACE  . Encounter for medication monitoring  . Weight loss  . Screening for lipoid disorders  . Diabetes mellitus screening  . Rash and nonspecific skin eruption   Past Medical History  Diagnosis Date  . Hx of basal cell carcinoma     face  . Diffuse cystic mastopathy   . Headache   . Unspecified hemorrhoids without mention of complication   . Other malaise and fatigue   . Disorder of bone and cartilage, unspecified   . Other psoriasis   . Other seborrheic  keratosis   . Sleep disturbance, unspecified   . Predominant disturbance of emotions   . Pain in limb   . Urticaria, unspecified   . History of shingles   . Anxiety   . Anemia   . Dysautonomia     mild   Past Surgical History  Procedure Date  . Cervical conization w/bx   . Cervical cancer 1973  . Breast biopsy 2001    benign  . Tubal ligation   . Knee surgery     right  . Dexa 03/2002    oseopenia  . Colonoscopy 12/04    int. hemorrhoids  . Dexa 07/2004    decreased BMD, osteopenia  . Dexa 02/2007    Osteopenia  . Breast lumpectomy     left underarm  . Birthmark removal childhood  . Dexa 9/10    Osteopenia slightly worse  . Felon i&d 12/2010    Dr. Merlyn Lot, I&D in OR (L index finger)   History  Substance Use Topics  . Smoking status: Former Smoker    Quit date: 07/25/1963  . Smokeless tobacco: Never Used  . Alcohol Use: Yes  1 glass daily   Family History  Problem Relation Age of Onset  . Multiple myeloma Father   . Coronary artery disease Father   . Transient ischemic attack Mother   . Lupus Maternal Aunt   . Prostate cancer Maternal Uncle   . Cancer Maternal Grandfather   . Multiple sclerosis Brother   . Breast cancer      Aunt x2   No Known Allergies Current Outpatient Prescriptions on File Prior to Visit  Medication Sig Dispense Refill  . Ascorbic Acid (VITAMIN C CR PO) Take by mouth as directed.        Marland Kitchen BIOTIN PO Take 1 tablet by mouth daily.        . Calcium Carbonate-Vit D-Min 1200-1000 MG-UNIT CHEW 1 tablet 2 (two) times daily.       . cholecalciferol (VITAMIN D) 1000 UNITS tablet Take 1,000 Units by mouth daily.        . Coenzyme Q10 (COQ10 PO) Take by mouth as directed.        . fish oil-omega-3 fatty acids 1000 MG capsule Take 2 g by mouth daily.        . folic acid-pyridoxine-cyancobalamin (FOLBIC) 2.5-25-2 MG TABS Take 1 tablet by mouth daily.  30 each  11  . Lactobacillus (ACIDOPHILUS PO) Take by mouth as directed.        . Multiple  Vitamin (MULTIVITAMIN) tablet Take 1 tablet by mouth daily.        Marland Kitchen acetaminophen (TYLENOL) 500 MG tablet Take 500 mg by mouth every 6 (six) hours as needed.        . diphenhydrAMINE (BENADRYL) 25 mg capsule Take 25 mg by mouth every 6 (six) hours as needed.        Marland Kitchen HYDROcodone-acetaminophen (VICODIN) 5-500 MG per tablet Take 1 tablet by mouth every 6 (six) hours as needed for pain (watch out for sedation ).  20 tablet  0  . ibuprofen (ADVIL,MOTRIN) 200 MG tablet Take 200 mg by mouth every 6 (six) hours as needed.        . predniSONE (DELTASONE) 20 MG tablet Take 10 mg by mouth daily.       Marland Kitchen VITAMIN E PO Take by mouth as directed.             Review of Systems Review of Systems  Constitutional: Negative for fever, appetite change, and unexpected weight change. pos for fatigue  Eyes: Negative for pain and visual disturbance.  Respiratory: Negative for cough and shortness of breath.   Cardiovascular: Negative. For cp or sob  Gastrointestinal: Negative for nausea, diarrhea and constipation.  Genitourinary: Negative for urgency and frequency.  Skin: Negative for pallor.neg for rash   Neurological: Negative for weakness, light-headedness, numbness and headaches.  Hematological: Negative for adenopathy. Does not bruise/bleed easily.  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 No distress.       Slim / fatigued appearing with slow speech  Wearing sunglasses  Husband present   HENT:  Head: Normocephalic and atraumatic.  Mouth/Throat: Oropharynx is clear and moist.  Eyes: Conjunctivae and EOM are normal. Pupils are equal, round, and reactive to light.  Neck: Normal range of motion. Neck supple. No JVD present. Carotid bruit is not present. No thyromegaly  present.    Cardiovascular: Normal rate, regular rhythm, normal heart sounds and intact distal pulses.   Pulmonary/Chest: Breath sounds normal. No respiratory distress. She has no wheezes.  Abdominal: Soft. Bowel sounds are normal. She exhibits no distension. There is no tenderness.  Musculoskeletal: Normal range of motion. She exhibits no edema and no tenderness.  Lymphadenopathy:    She has no cervical adenopathy.  Neurological: She is alert. She has normal strength and normal reflexes. No cranial nerve deficit or sensory deficit. Coordination normal.       Generally slow speech and motions   Skin: Skin is warm and dry. No rash noted. No erythema. No pallor.  Psychiatric: Her behavior is normal.       Seems down and at times anxious (when discussing job)  Occasionally almost tearful  No SI          Assessment & Plan:

## 2011-03-06 NOTE — Assessment & Plan Note (Signed)
Will slowly advance gabapentin as px by headache specialist and follow up closely Overall slt imp

## 2011-03-06 NOTE — Assessment & Plan Note (Signed)
At this time I still think depression is in the differential for her symptoms  For counseling appt upcoming  Disc stressors in length (also with her husband ) today

## 2011-03-06 NOTE — Assessment & Plan Note (Signed)
While this and headache are very slowly improving - still not better  This all started with finger infection - 2 mo ago (and equivicol tick lab) Pt and husband requested infx dz consult - which is not unreasonable Will do ref

## 2011-03-07 ENCOUNTER — Ambulatory Visit: Payer: BC Managed Care – PPO | Admitting: Family Medicine

## 2011-03-15 ENCOUNTER — Ambulatory Visit (INDEPENDENT_AMBULATORY_CARE_PROVIDER_SITE_OTHER): Payer: BC Managed Care – PPO | Admitting: Psychology

## 2011-03-15 DIAGNOSIS — F4323 Adjustment disorder with mixed anxiety and depressed mood: Secondary | ICD-10-CM

## 2011-03-22 ENCOUNTER — Ambulatory Visit (INDEPENDENT_AMBULATORY_CARE_PROVIDER_SITE_OTHER): Payer: BC Managed Care – PPO | Admitting: Psychology

## 2011-03-22 DIAGNOSIS — F4323 Adjustment disorder with mixed anxiety and depressed mood: Secondary | ICD-10-CM

## 2011-03-29 ENCOUNTER — Other Ambulatory Visit (INDEPENDENT_AMBULATORY_CARE_PROVIDER_SITE_OTHER): Payer: BC Managed Care – PPO

## 2011-03-29 DIAGNOSIS — Z13220 Encounter for screening for lipoid disorders: Secondary | ICD-10-CM

## 2011-03-29 DIAGNOSIS — M949 Disorder of cartilage, unspecified: Secondary | ICD-10-CM

## 2011-03-29 DIAGNOSIS — Z131 Encounter for screening for diabetes mellitus: Secondary | ICD-10-CM

## 2011-03-29 DIAGNOSIS — R5383 Other fatigue: Secondary | ICD-10-CM

## 2011-03-29 DIAGNOSIS — R5381 Other malaise: Secondary | ICD-10-CM

## 2011-03-29 DIAGNOSIS — M899 Disorder of bone, unspecified: Secondary | ICD-10-CM

## 2011-03-29 LAB — COMPREHENSIVE METABOLIC PANEL
ALT: 18 U/L (ref 0–35)
AST: 25 U/L (ref 0–37)
Albumin: 4 g/dL (ref 3.5–5.2)
Alkaline Phosphatase: 53 U/L (ref 39–117)
BUN: 11 mg/dL (ref 6–23)
CO2: 29 mEq/L (ref 19–32)
Calcium: 9.1 mg/dL (ref 8.4–10.5)
Chloride: 106 mEq/L (ref 96–112)
Creatinine, Ser: 0.6 mg/dL (ref 0.4–1.2)
GFR: 100.13 mL/min (ref >60.00)
Glucose, Bld: 85 mg/dL (ref 70–99)
Potassium: 3.6 mEq/L (ref 3.5–5.1)
Sodium: 142 mEq/L (ref 135–145)
Total Bilirubin: 0.5 mg/dL (ref 0.3–1.2)
Total Protein: 6.7 g/dL (ref 6.0–8.3)

## 2011-03-29 LAB — CBC WITH DIFFERENTIAL/PLATELET
Basophils Absolute: 0 10*3/uL (ref 0.0–0.1)
Basophils Relative: 0.5 % (ref 0.0–3.0)
Eosinophils Absolute: 0.1 10*3/uL (ref 0.0–0.7)
Eosinophils Relative: 2.5 % (ref 0.0–5.0)
HCT: 35.3 % — ABNORMAL LOW (ref 36.0–46.0)
Hemoglobin: 11.8 g/dL — ABNORMAL LOW (ref 12.0–15.0)
Lymphocytes Relative: 29.3 % (ref 12.0–46.0)
Lymphs Abs: 1.2 10*3/uL (ref 0.7–4.0)
MCHC: 33.4 g/dL (ref 30.0–36.0)
MCV: 100.2 fl — ABNORMAL HIGH (ref 78.0–100.0)
Monocytes Absolute: 0.4 10*3/uL (ref 0.1–1.0)
Monocytes Relative: 9.5 % (ref 3.0–12.0)
Neutro Abs: 2.3 10*3/uL (ref 1.4–7.7)
Neutrophils Relative %: 58.2 % (ref 43.0–77.0)
Platelets: 130 10*3/uL — ABNORMAL LOW (ref 150.0–400.0)
RBC: 3.52 Mil/uL — ABNORMAL LOW (ref 3.87–5.11)
RDW: 13.9 % (ref 11.5–14.6)
WBC: 4 10*3/uL — ABNORMAL LOW (ref 4.5–10.5)

## 2011-03-29 LAB — TSH: TSH: 1.5 u[IU]/mL (ref 0.35–5.50)

## 2011-03-29 LAB — LIPID PANEL
Cholesterol: 197 mg/dL (ref 0–200)
HDL: 65 mg/dL (ref >39.00)
LDL Cholesterol: 117 mg/dL — ABNORMAL HIGH (ref 0–99)
Total CHOL/HDL Ratio: 3
Triglycerides: 76 mg/dL (ref 0.0–149.0)
VLDL: 15.2 mg/dL (ref 0.0–40.0)

## 2011-03-30 LAB — VITAMIN D 25 HYDROXY (VIT D DEFICIENCY, FRACTURES): Vit D, 25-Hydroxy: 54 ng/mL (ref 30–89)

## 2011-04-05 ENCOUNTER — Other Ambulatory Visit (HOSPITAL_COMMUNITY)
Admission: RE | Admit: 2011-04-05 | Discharge: 2011-04-05 | Disposition: A | Discharge status: Home / Self Care | Payer: BC Managed Care – PPO | Source: Ambulatory Visit | Attending: Family Medicine | Admitting: Family Medicine

## 2011-04-05 ENCOUNTER — Ambulatory Visit (INDEPENDENT_AMBULATORY_CARE_PROVIDER_SITE_OTHER): Payer: BC Managed Care – PPO | Admitting: Psychology

## 2011-04-05 ENCOUNTER — Encounter: Payer: Self-pay | Admitting: Family Medicine

## 2011-04-05 ENCOUNTER — Ambulatory Visit (INDEPENDENT_AMBULATORY_CARE_PROVIDER_SITE_OTHER): Payer: BC Managed Care – PPO | Admitting: Family Medicine

## 2011-04-05 VITALS — BP 102/68 | HR 76 | Temp 98.3°F | Ht 63.0 in | Wt 100.8 lb

## 2011-04-05 DIAGNOSIS — Z8541 Personal history of malignant neoplasm of cervix uteri: Secondary | ICD-10-CM | POA: Insufficient documentation

## 2011-04-05 DIAGNOSIS — Z13220 Encounter for screening for lipoid disorders: Secondary | ICD-10-CM

## 2011-04-05 DIAGNOSIS — Z1231 Encounter for screening mammogram for malignant neoplasm of breast: Secondary | ICD-10-CM

## 2011-04-05 DIAGNOSIS — F4323 Adjustment disorder with mixed anxiety and depressed mood: Secondary | ICD-10-CM

## 2011-04-05 DIAGNOSIS — M899 Disorder of bone, unspecified: Secondary | ICD-10-CM

## 2011-04-05 DIAGNOSIS — Z01419 Encounter for gynecological examination (general) (routine) without abnormal findings: Secondary | ICD-10-CM | POA: Insufficient documentation

## 2011-04-05 DIAGNOSIS — Z124 Encounter for screening for malignant neoplasm of cervix: Secondary | ICD-10-CM | POA: Insufficient documentation

## 2011-04-05 DIAGNOSIS — R51 Headache: Secondary | ICD-10-CM

## 2011-04-05 DIAGNOSIS — Z23 Encounter for immunization: Secondary | ICD-10-CM

## 2011-04-05 DIAGNOSIS — R4589 Other symptoms and signs involving emotional state: Secondary | ICD-10-CM

## 2011-04-05 DIAGNOSIS — M949 Disorder of cartilage, unspecified: Secondary | ICD-10-CM

## 2011-04-05 LAB — HM PAP SMEAR: HM Pap smear: NORMAL

## 2011-04-05 NOTE — Progress Notes (Signed)
Subjective:    Patient ID: Veronica Ward, female    DOB: June 01, 1946, 65 y.o.   MRN: [id number]  HPI Here for check up of chronic medical problems and to review health mt list  Overall doing much better Gabapentin is helping headaches quite a bit  Last week had a GI but and then felt better  Eating better now  Saw counselor - and was useful- and will see her today comitted to do less at work   [employer] is stable with bmi of 17- low Continues to eat well  Is  Also  Had shingles in past Pneumovax Flu shot tdap 03  Mam 10/11 normal   colonosc 12/04 normal   Pap 6/11 normal Hx of conization and cerv ca in past   Anemia mild 11.8 hb also platelet 130  dexa 9/10- osteopenia on evista at the time   Little knot on L big toe - OA? Does not hurt currently  Patient Active Problem List  Diagnoses  . BASAL CELL CARCINOMA, FACE  . STRESS REACTION, ACUTE, WITH EMOTIONAL DISTURBANCE  . HEMORRHOIDS  . FIBROCYSTIC BREAST DISEASE  . PSORIASIS  . SEBORRHEIC KERATOSIS  . URTICARIA  . TOE PAIN  . OSTEOPENIA  . SLEEP DISORDER  . MALAISE AND FATIGUE  . HEADACHE  . BASAL CELL CARCINOMA, FACE  . Encounter for medication monitoring  . Weight loss  . Screening for lipoid disorders  . Diabetes mellitus screening  . Other screening mammogram  . Gynecological examination  . History of cervical cancer   Past Medical History  Diagnosis Date  . Hx of basal cell carcinoma     face  . Diffuse cystic mastopathy   . Headache   . Unspecified hemorrhoids without mention of complication   . Other malaise and fatigue   . Disorder of bone and cartilage, unspecified   . Other psoriasis   . Other seborrheic keratosis   . Sleep disturbance, unspecified   . Predominant disturbance of emotions   . Pain in limb   . Urticaria, unspecified   . History of shingles   . Anxiety   . Anemia   . Dysautonomia     mild   Past Surgical History  Procedure Date  . Cervical conization w/bx   .  Cervical cancer 1973  . Breast biopsy 2001    benign  . Tubal ligation   . Knee surgery     right  . Dexa 03/2002    oseopenia  . Colonoscopy 12/04    int. hemorrhoids  . Dexa 07/2004    decreased BMD, osteopenia  . Dexa 02/2007    Osteopenia  . Breast lumpectomy     left underarm  . Birthmark removal childhood  . Dexa 9/10    Osteopenia slightly worse  . Felon i&d 12/2010    Dr. Merlyn Lot, I&D in OR (L index finger)   History  Substance Use Topics  . Smoking status: Former Smoker    Quit date: 07/25/1963  . Smokeless tobacco: Never Used  . Alcohol Use: Yes     1 glass daily   Family History  Problem Relation Age of Onset  . Multiple myeloma Father   . Coronary artery disease Father   . Transient ischemic attack Mother   . Lupus Maternal Aunt   . Prostate cancer Maternal Uncle   . Cancer Maternal Grandfather   . Multiple sclerosis Brother   . Breast cancer      Aunt x2  No Known Allergies Current Outpatient Prescriptions on File Prior to Visit  Medication Sig Dispense Refill  . Ascorbic Acid (VITAMIN C CR PO) Take by mouth as directed.        Marland Kitchen BIOTIN PO Take 1 tablet by mouth daily.        . Calcium Carbonate-Vit D-Min 1200-1000 MG-UNIT CHEW 1 tablet 2 (two) times daily.       . cholecalciferol (VITAMIN D) 1000 UNITS tablet Take 1,000 Units by mouth daily.        . Coenzyme Q10 (COQ10 PO) Take by mouth as directed.        . fish oil-omega-3 fatty acids 1000 MG capsule Take 2 g by mouth daily.        . folic acid-pyridoxine-cyancobalamin (FOLBIC) 2.5-25-2 MG TABS Take 1 tablet by mouth daily.  30 each  11  . Lactobacillus (ACIDOPHILUS PO) Take by mouth as directed.        . Multiple Vitamin (MULTIVITAMIN) tablet Take 1 tablet by mouth daily.        Marland Kitchen acetaminophen (TYLENOL) 500 MG tablet Take 500 mg by mouth every 6 (six) hours as needed.        . diphenhydrAMINE (BENADRYL) 25 mg capsule Take 25 mg by mouth every 6 (six) hours as needed.        Marland Kitchen  HYDROcodone-acetaminophen (VICODIN) 5-500 MG per tablet Take 1 tablet by mouth every 6 (six) hours as needed for pain (watch out for sedation ).  20 tablet  0  . ibuprofen (ADVIL,MOTRIN) 200 MG tablet Take 200 mg by mouth every 6 (six) hours as needed.        . predniSONE (DELTASONE) 20 MG tablet Take 10 mg by mouth daily.       Marland Kitchen VITAMIN E PO Take by mouth as directed.              Review of Systems Review of Systems  Constitutional: Negative for fever, appetite change,  and unexpected weight change. pos for fatigue that is imroving  Eyes: Negative for pain and visual disturbance.  Respiratory: Negative for cough and shortness of breath.   Cardiovascular: Negative for cp or palpitations    Gastrointestinal: Negative for nausea, diarrhea and constipation.  Genitourinary: Negative for urgency and frequency.  Skin: Negative for pallor or rash   Neurological: Negative for weakness, light-headedness, numbness and headaches.  Hematological: Negative for adenopathy. Does not bruise/bleed easily.  Psychiatric/Behavioral: Negative for dysphoric mood. The patient is not nervous/anxious.          Objective:   Physical Exam  Constitutional: She appears well-developed and well-nourished. No distress.       Slim and well appearing   HENT:  Head: Normocephalic and atraumatic.  Right Ear: External ear normal.  Left Ear: External ear normal.  Nose: Nose normal.  Mouth/Throat: Oropharynx is clear and moist.  Eyes: Conjunctivae and EOM are normal. Pupils are equal, round, and reactive to light.  Neck: Normal range of motion. Neck supple. No JVD present. Carotid bruit is not present. No thyromegaly present.  Cardiovascular: Normal rate, regular rhythm, normal heart sounds and intact distal pulses.   Pulmonary/Chest: Effort normal and breath sounds normal. No respiratory distress. She has no wheezes. She exhibits no tenderness.  Abdominal: Soft. Bowel sounds are normal. She exhibits no distension  and no mass. There is no tenderness.  Genitourinary: Vagina normal and uterus normal. No breast swelling, tenderness, discharge or bleeding. No vaginal discharge found.  Musculoskeletal: Normal  range of motion. She exhibits no edema and no tenderness.  Lymphadenopathy:    She has no cervical adenopathy.  Neurological: She is alert. She has normal reflexes. No cranial nerve deficit. Coordination normal.  Skin: Skin is warm and dry. No rash noted. No erythema. No pallor.  Psychiatric: She has a normal mood and affect.          Assessment & Plan:

## 2011-04-05 NOTE — Assessment & Plan Note (Signed)
Mam sched Nl breast exam  Pt will continue self exams

## 2011-04-05 NOTE — Assessment & Plan Note (Signed)
Good D level  Takes ca and D and walks  sched 2 y dexa and update

## 2011-04-05 NOTE — Assessment & Plan Note (Signed)
Cholesterol ok with good HDL and LDL 117 Rev low sat fat diet

## 2011-04-05 NOTE — Assessment & Plan Note (Signed)
Overall much better with gabapentin Does not feel like she needs trigger point injections  I will take over for the headaches unless her status changes

## 2011-04-05 NOTE — Assessment & Plan Note (Signed)
Will continue with yearly pap

## 2011-04-05 NOTE — Assessment & Plan Note (Signed)
Overall improved with counseling

## 2011-04-05 NOTE — Patient Instructions (Signed)
Flu shot and pneumonia vaccine today If you are interested in shingles vaccine in future - call your insurance company to see how coverage is and call us to schedule in 1 month or more  We will refer you for mammogram and dexa at check out

## 2011-04-10 ENCOUNTER — Ambulatory Visit (INDEPENDENT_AMBULATORY_CARE_PROVIDER_SITE_OTHER)
Admission: RE | Admit: 2011-04-10 | Discharge: 2011-04-10 | Disposition: A | Discharge status: Home / Self Care | Payer: BC Managed Care – PPO | Source: Ambulatory Visit

## 2011-04-10 DIAGNOSIS — M899 Disorder of bone, unspecified: Secondary | ICD-10-CM

## 2011-04-10 DIAGNOSIS — M949 Disorder of cartilage, unspecified: Secondary | ICD-10-CM

## 2011-04-26 ENCOUNTER — Other Ambulatory Visit: Payer: Self-pay

## 2011-04-26 ENCOUNTER — Ambulatory Visit (INDEPENDENT_AMBULATORY_CARE_PROVIDER_SITE_OTHER): Payer: BC Managed Care – PPO | Admitting: Psychology

## 2011-04-26 DIAGNOSIS — F4323 Adjustment disorder with mixed anxiety and depressed mood: Secondary | ICD-10-CM

## 2011-04-26 NOTE — Telephone Encounter (Signed)
Patient notified as instructed by telephone. 

## 2011-04-26 NOTE — Telephone Encounter (Signed)
Pt said she has 6 more days of gabapentin and pt said she has recently started drinking [place] breakfast tea in AM and that has helped her h/a's and pt feels better. Still has some h/as but much less intensity since drinking the [place] tea in AM. Pt wonders if Dr Milinda Antis wants her to continue the Gabapentin 300 mg taking once daily. If so would like refill sent to [facility]. Pt said call back on Thursday would be fine and pt can be reached [phone].

## 2011-04-26 NOTE — Telephone Encounter (Signed)
She can go ahead and stop it if she is feeling much better - just let me know if headaches return - would re start and continue to follow

## 2011-05-01 ENCOUNTER — Encounter: Payer: Self-pay | Admitting: Family Medicine

## 2011-05-08 ENCOUNTER — Ambulatory Visit
Admission: RE | Admit: 2011-05-08 | Discharge: 2011-05-08 | Disposition: A | Discharge status: Home / Self Care | Payer: BC Managed Care – PPO | Source: Ambulatory Visit | Attending: Family Medicine | Admitting: Family Medicine

## 2011-05-08 DIAGNOSIS — Z1231 Encounter for screening mammogram for malignant neoplasm of breast: Secondary | ICD-10-CM

## 2011-05-10 ENCOUNTER — Encounter: Payer: Self-pay | Admitting: *Deleted

## 2011-05-17 ENCOUNTER — Ambulatory Visit (INDEPENDENT_AMBULATORY_CARE_PROVIDER_SITE_OTHER): Payer: BC Managed Care – PPO | Admitting: Psychology

## 2011-05-17 DIAGNOSIS — F4323 Adjustment disorder with mixed anxiety and depressed mood: Secondary | ICD-10-CM

## 2011-06-07 ENCOUNTER — Telehealth: Payer: Self-pay | Admitting: *Deleted

## 2011-06-07 ENCOUNTER — Ambulatory Visit: Payer: BC Managed Care – PPO | Admitting: Psychology

## 2011-06-07 ENCOUNTER — Emergency Department (HOSPITAL_COMMUNITY)
Admission: EM | Admit: 2011-06-07 | Discharge: 2011-06-07 | Disposition: A | Discharge status: Home / Self Care | Payer: BC Managed Care – PPO | Attending: Emergency Medicine | Admitting: Emergency Medicine

## 2011-06-07 ENCOUNTER — Encounter (HOSPITAL_COMMUNITY): Payer: Self-pay | Admitting: Emergency Medicine

## 2011-06-07 DIAGNOSIS — R5381 Other malaise: Secondary | ICD-10-CM | POA: Insufficient documentation

## 2011-06-07 DIAGNOSIS — R197 Diarrhea, unspecified: Secondary | ICD-10-CM | POA: Insufficient documentation

## 2011-06-07 DIAGNOSIS — E860 Dehydration: Secondary | ICD-10-CM | POA: Insufficient documentation

## 2011-06-07 DIAGNOSIS — X58XXXA Exposure to other specified factors, initial encounter: Secondary | ICD-10-CM | POA: Insufficient documentation

## 2011-06-07 DIAGNOSIS — S0010XA Contusion of unspecified eyelid and periocular area, initial encounter: Secondary | ICD-10-CM | POA: Insufficient documentation

## 2011-06-07 DIAGNOSIS — R112 Nausea with vomiting, unspecified: Secondary | ICD-10-CM | POA: Insufficient documentation

## 2011-06-07 MED ORDER — ONDANSETRON HCL 4 MG PO TABS: 4.0000 mg | ORAL_TABLET | Freq: Four times a day (QID) | ORAL | Status: AC

## 2011-06-07 MED ORDER — SODIUM CHLORIDE 0.9 % IV SOLN
INTRAVENOUS | Status: DC
  Administered 2011-06-07: 06:00:00 via INTRAVENOUS

## 2011-06-07 MED ORDER — SODIUM CHLORIDE 0.9 % IV BOLUS (SEPSIS)
1000.0000 mL | Freq: Once | INTRAVENOUS | Status: AC
  Administered 2011-06-07: 1000 mL via INTRAVENOUS

## 2011-06-07 MED ORDER — ONDANSETRON HCL 4 MG/2ML IJ SOLN
4.0000 mg | INTRAMUSCULAR | Status: DC | PRN
  Administered 2011-06-07: 4 mg via INTRAVENOUS

## 2011-06-07 MED ORDER — ONDANSETRON HCL 4 MG/2ML IJ SOLN
INTRAMUSCULAR | Status: AC
  Filled 2011-06-07: qty 2

## 2011-06-07 NOTE — ED Notes (Signed)
Pt has dark purple bruise under left eye.  When being triaged, pt kept hair over eye.  When asked what happened to her eye pt. St's "What?"  Pt st's it must be make up.  When I ask pt's husband if he knew what happened he st's he showed her with a mirror, then said "Eldene, what happed to your eye? Pt responded I don't know, husband st's she doesn't know.

## 2011-06-07 NOTE — ED Notes (Signed)
Pt provided w/ ginger ale for PO challenge per Dr. Lynelle Doctor, EDP

## 2011-06-07 NOTE — Telephone Encounter (Signed)
Fax from call a nurse is on your shelf, pt called them- she had vomited several times within 2.5 hours.

## 2011-06-07 NOTE — ED Notes (Addendum)
Pt reports n/v that began approx 22:00 - pt states she has been feeling well earlier today and acutely began having n/v approx 22:30 - pt admits to x1 episode of diarrhea on arrival to ED. Pt denies body aches, pain, fever, or chills. Abd flat, non-distended, non-tender. Pt resting comfortably on bed in no acute distress, family at bedside.

## 2011-06-07 NOTE — ED Notes (Signed)
Pt c/o nausea, vomiting and abd cramping onset approx 10:30 pm.  Pt denies diarrhea.

## 2011-06-07 NOTE — Telephone Encounter (Signed)
Pt called with follow up report from ER.  She doesn't have anymore nausea but has developed a headache since taking a nap after coming home.  She is asking what she can take for this.  She has advil, ok to take this?

## 2011-06-07 NOTE — ED Provider Notes (Cosign Needed)
History     CSN: [id number] Arrival date & time: 06/07/2011  1:59 AM   First MD Initiated Contact with Patient 06/07/11 [phone]      Chief Complaint  Patient presents with  . Nausea  . Emesis  . Diarrhea    (Consider location/radiation/quality/duration/timing/severity/associated sxs/prior treatment) HPI  Patient relates she and her husband ate the same thing last night except she ate some pineapple that she stated didn't taste right to about 7:30 PM. At 10:30 PM when she was getting ready for bed she had acute onset of nausea and vomiting. She denies abdominal pain. She states she had some diarrhea. She states it seems to come in waves. Patient does have some bruising on her lower eyelid on the left she denies any fall or injury. Husband states when he first came home he did not notice the bruising. Patient looks like she feels bad. She states she feels weak. She continues to have dry heaving and vomiting during my exam.  Primary care physician is Vinnie Langton  Past Medical History  Diagnosis Date  . Hx of basal cell carcinoma     face  . Diffuse cystic mastopathy   . Headache   . Unspecified hemorrhoids without mention of complication   . Other malaise and fatigue   . Disorder of bone and cartilage, unspecified   . Other psoriasis   . Other seborrheic keratosis   . Sleep disturbance, unspecified   . Predominant disturbance of emotions   . Pain in limb   . Urticaria, unspecified   . History of shingles   . Anxiety   . Anemia   . Dysautonomia     mild    Past Surgical History  Procedure Date  . Cervical conization w/bx   . Cervical cancer 1973  . Breast biopsy 2001    benign  . Tubal ligation   . Dexa 03/2002    oseopenia  . Colonoscopy 12/04    int. hemorrhoids  . Dexa 07/2004    decreased BMD, osteopenia  . Dexa 02/2007    Osteopenia  . Breast lumpectomy     left underarm  . Birthmark removal childhood  . Dexa 9/10    Osteopenia slightly worse  . Felon i&d  12/2010    Dr. Merlyn Lot, I&D in OR (L index finger)  . Finger surgery   . Knee surgery     right    Family History  Problem Relation Age of Onset  . Multiple myeloma Father   . Coronary artery disease Father   . Transient ischemic attack Mother   . Lupus Maternal Aunt   . Prostate cancer Maternal Uncle   . Cancer Maternal Grandfather   . Multiple sclerosis Brother   . Breast cancer      Aunt x2    History  Substance Use Topics  . Smoking status: Former Smoker    Quit date: 07/25/1963  . Smokeless tobacco: Never Used  . Alcohol Use: Yes     1/2 glass of wine a day   lives with spouse  OB History    Grav Para Term Preterm Abortions TAB SAB Ect Mult Living                  Review of Systems  All other systems reviewed and are negative.    Allergies  Review of patient's allergies indicates no known allergies.  Home Medications   Current Outpatient Rx  Name Route Sig Dispense Refill  . FA-PYRIDOXINE-CYANCOBALAMIN  2.5-25-2 MG PO TABS Oral Take 1 tablet by mouth daily. 30 each 11    BP 93/57  Pulse 70  Temp(Src) 97.7 F (36.5 C) (Oral)  Resp 16  SpO2 94%  Vital signs hypotension without tachycardia otherwise normal  Physical Exam  Vitals reviewed. Constitutional: She is oriented to person, place, and time. She appears well-developed and well-nourished.  Non-toxic appearance. She does not appear ill. She appears distressed.       Patient actively gagging and vomiting  HENT:  Head: Normocephalic and atraumatic.  Right Ear: External ear normal.  Left Ear: External ear normal.  Nose: Nose normal. No mucosal edema or rhinorrhea.  Mouth/Throat: Mucous membranes are dry. No dental abscesses or uvula swelling. No oropharyngeal exudate, posterior oropharyngeal edema, posterior oropharyngeal erythema or tonsillar abscesses.  Eyes: Conjunctivae and EOM are normal. Pupils are equal, round, and reactive to light.       Patient is noted to have a small area of bruising of  the medial lower left eyelid. The orbital rims are nontender to palpation.  Neck: Normal range of motion and full passive range of motion without pain. Neck supple.  Cardiovascular: Normal rate, regular rhythm and normal heart sounds.  Exam reveals no gallop and no friction rub.   No murmur heard. Pulmonary/Chest: Effort normal and breath sounds normal. No respiratory distress. She has no wheezes. She has no rhonchi. She has no rales. She exhibits no tenderness and no crepitus.  Abdominal: Soft. Normal appearance and bowel sounds are normal. She exhibits no distension. There is no tenderness. There is no rebound and no guarding.  Musculoskeletal: Normal range of motion. She exhibits no edema and no tenderness.       Moves all extremities well.   Neurological: She is alert and oriented to person, place, and time. She has normal strength. No cranial nerve deficit.  Skin: Skin is warm, dry and intact. No rash noted. No erythema. No pallor.  Psychiatric: Her speech is normal and behavior is normal. Her mood appears not anxious.       Patient looks like she feels bad she is a little agitated at times    ED Course  Procedures (including critical care time)  Patient given IV fluids and IV nausea medicine. When she was rechecked at 06:45 she states she still has no abdominal pain. She states her nausea is also gone. She is willing to try oral liquids to see if she feels okay. We discussed the bruising under her eyes probably from the force of vomiting with a ruptured capillary vessel. She's advised we'll go through the typical color changes with a bruise.  07:10 2 has been drinking fluids with no further nausea or vomiting. She feels ready to home. We discussed her diet over the next couple days. Patient's hypotension has corrected with IV fluids.   Medications  0.9 %  sodium chloride infusion (  Intravenous New Bag 06/07/11 0540)  ondansetron (ZOFRAN) injection 4 mg (4 mg Intravenous Given 06/07/11  0402)  sodium chloride 0.9 % bolus 1,000 mL (1000 mL Intravenous Given 06/07/11 0401)   Discharge diagnoses 1. Nausea and vomiting   2. Dehydration     Medications    ondansetron (ZOFRAN) 4 MG tablet (not administered)    Plan discharge  Devoria Albe, MD, FACEP  CRITICAL CARE Performed by: Devoria Albe L   Total critical care time:30 minutes Critical care time was exclusive of separately billable procedures and treating other patients.  Critical care was necessary  to treat or prevent imminent or life-threatening deterioration.  Critical care was time spent personally by me on the following activities: development of treatment plan with patient and/or surrogate as well as nursing, discussions with consultants, evaluation of patient's response to treatment, examination of patient, obtaining history from patient or surrogate, ordering and performing treatments and interventions, ordering and review of laboratory studies, ordering and review of radiographic studies, pulse oximetry and re-evaluation of patient's condition.    MDM          Ward Givens, MD 06/07/11 [phone]

## 2011-06-07 NOTE — Telephone Encounter (Signed)
I reviewed that - they sent to ER - and I read ER note - dx with n/v - abd pain resolved and she got IV fluids -- felt better with antinausea med and was discharged

## 2011-06-07 NOTE — ED Notes (Signed)
Pt noted to have contusion to left eye - pt states she has no idea how this injury occurred. Pt denies any mechanism of injury, HA, or blurred vision.

## 2011-06-07 NOTE — Telephone Encounter (Signed)
Patient notified as instructed by telephone. Pt will keep [place] updated.

## 2011-06-07 NOTE — Telephone Encounter (Signed)
Only if she can eat with it- otherwise will irritate her stomach further  Tylenol is ok if she cannot take that  The headache may be from dehydration as well - so keep gradually hydrating and keep me updated

## 2011-07-05 ENCOUNTER — Ambulatory Visit (INDEPENDENT_AMBULATORY_CARE_PROVIDER_SITE_OTHER): Payer: BC Managed Care – PPO | Admitting: Psychology

## 2011-07-05 DIAGNOSIS — F4323 Adjustment disorder with mixed anxiety and depressed mood: Secondary | ICD-10-CM

## 2011-10-06 ENCOUNTER — Encounter (HOSPITAL_COMMUNITY): Payer: Self-pay | Admitting: *Deleted

## 2011-10-06 ENCOUNTER — Telehealth: Payer: Self-pay | Admitting: Family Medicine

## 2011-10-06 ENCOUNTER — Emergency Department (HOSPITAL_COMMUNITY)
Admission: EM | Admit: 2011-10-06 | Discharge: 2011-10-06 | Disposition: A | Discharge status: Home / Self Care | Payer: BC Managed Care – PPO | Source: Home / Self Care | Attending: Family Medicine | Admitting: Family Medicine

## 2011-10-06 ENCOUNTER — Other Ambulatory Visit: Payer: Self-pay

## 2011-10-06 DIAGNOSIS — H8110 Benign paroxysmal vertigo, unspecified ear: Secondary | ICD-10-CM

## 2011-10-06 MED ORDER — MECLIZINE HCL 25 MG PO TABS: 25.0000 mg | ORAL_TABLET | Freq: Three times a day (TID) | ORAL | Status: AC | PRN

## 2011-10-06 NOTE — Discharge Instructions (Signed)
Your examination was unremarkable for any acute reason for your dizziness. Your EKG showed no acute abnormalities. Your vital signs were unremarkable. While dizziness has many causes, and we cannot rule them all out today, most serious causes are not supported by your history and examination. Take the prescribed medication as directed. Monitor your symptoms for improvement, and increase your fluid intake. Return to care should your symptoms not improve, or worsen in any way, such as headache, persistent vomiting, unsteady gait, numbness, tingling, weakness, vision changes, chest pain, or shortness of breath.

## 2011-10-06 NOTE — ED Notes (Signed)
Pt is here with complaints of multiple episodes of dizziness since yesterday with nausea and vomiting.  Pt states she feels like the room is spinning.  Gets worse when she bends over, and she actually fell at home while bending over this am.  Denies any cardiac history.  Does reports that her ears were "itchy" last night.

## 2011-10-06 NOTE — ED Provider Notes (Signed)
History     CSN: [id number]  Arrival date & time 10/06/11  1227   First MD Initiated Contact with Patient 10/06/11 1251      Chief Complaint  Patient presents with  . Dizziness    (Consider location/radiation/quality/duration/timing/severity/associated sxs/prior treatment) HPI Comments: Veronica Ward presents for evaluation of repeated episodes of dizziness over the last few days. She reports onset two days ago, when she states that she turned over in bed, opened her eyes and felt the sensation of the room spinning. She then reports intermittent episodes of the room spinning or dizziness, mostly with head movement. Today, her symptoms worsened to the point where she was concerned enough to come in. She reports onset of vomiting this morning, reporting 3 or 4 times. She also reports that she bent over to pick something up, and fell forward. She did not strike her head, and she denies any loss of consciousness. She also reports that her ears felt itchy. She denies any chest pain, chest pressure, shortness of breath, numbness, tingling, weakness, headaches. She does reports blurred vision since onset of symptoms. She denies any gait disturbance, or difficulty walking.  Patient is a 66 y.o. female presenting with neurologic complaint. The history is provided by the patient.  Neurologic Problem The primary symptoms include dizziness and visual change. Primary symptoms do not include headaches, syncope, loss of consciousness, altered mental status, paresthesias, nausea or vomiting. The symptoms began 2 days ago. The symptoms are unchanged.  She describes the dizziness as a sensation of spinning. The dizziness began 2 to 5 days ago. The dizziness has been unchanged since its onset. It is a new problem. The dizziness is associated with a specific position. Dizziness also occurs with blurred vision. Dizziness does not occur with tinnitus, nausea, vomiting, weakness or diaphoresis.  The visual change includes  blurred vision.  Additional symptoms do not include weakness, hearing loss or tinnitus.    Past Medical History  Diagnosis Date  . Hx of basal cell carcinoma     face  . Diffuse cystic mastopathy   . Headache   . Unspecified hemorrhoids without mention of complication   . Other malaise and fatigue   . Disorder of bone and cartilage, unspecified   . Other psoriasis   . Other seborrheic keratosis   . Sleep disturbance, unspecified   . Predominant disturbance of emotions   . Pain in limb   . Urticaria, unspecified   . History of shingles   . Anxiety   . Anemia   . Dysautonomia     mild    Past Surgical History  Procedure Date  . Cervical conization w/bx   . Cervical cancer 1973  . Breast biopsy 2001    benign  . Tubal ligation   . Dexa 03/2002    oseopenia  . Colonoscopy 12/04    int. hemorrhoids  . Dexa 07/2004    decreased BMD, osteopenia  . Dexa 02/2007    Osteopenia  . Breast lumpectomy     left underarm  . Birthmark removal childhood  . Dexa 9/10    Osteopenia slightly worse  . Felon i&d 12/2010    Dr. Merlyn Lot, I&D in OR (L index finger)  . Finger surgery   . Knee surgery     right    Family History  Problem Relation Age of Onset  . Multiple myeloma Father   . Coronary artery disease Father   . Transient ischemic attack Mother   . Lupus Maternal Aunt   .  Prostate cancer Maternal Uncle   . Cancer Maternal Grandfather   . Multiple sclerosis Brother   . Breast cancer      Aunt x2    History  Substance Use Topics  . Smoking status: Former Smoker    Quit date: 07/25/1963  . Smokeless tobacco: Never Used  . Alcohol Use: Yes     1/2 glass of wine a day    OB History    Grav Para Term Preterm Abortions TAB SAB Ect Mult Living                  Review of Systems  Constitutional: Negative.  Negative for diaphoresis.  HENT: Negative.  Negative for hearing loss, neck pain, tinnitus and ear discharge.   Eyes: Positive for blurred vision.    Respiratory: Negative.   Cardiovascular: Negative.  Negative for leg swelling and syncope.  Gastrointestinal: Negative.  Negative for nausea and vomiting.  Genitourinary: Negative.   Musculoskeletal: Negative.   Skin: Negative.   Neurological: Positive for dizziness and light-headedness. Negative for loss of consciousness, syncope, weakness, numbness, headaches and paresthesias.  Psychiatric/Behavioral: Negative for altered mental status.    Allergies  Review of patient's allergies indicates no known allergies.  Home Medications   Current Outpatient Rx  Name Route Sig Dispense Refill  . FA-PYRIDOXINE-CYANCOBALAMIN 2.5-25-2 MG PO TABS Oral Take 1 tablet by mouth daily. 30 each 11  . MECLIZINE HCL 25 MG PO TABS Oral Take 1 tablet (25 mg total) by mouth 3 (three) times daily as needed. 30 tablet 0    BP 146/75  Pulse 87  Temp(Src) 98.3 F (36.8 C) (Oral)  Resp 18  SpO2 100%  Physical Exam  Constitutional: She is oriented to person, place, and time. Vital signs are normal.  Eyes: Conjunctivae and EOM are normal. Pupils are equal, round, and reactive to light.  Fundoscopic exam:      The right eye shows no arteriolar narrowing, no exudate, no hemorrhage and no papilledema.       The left eye shows no arteriolar narrowing, no exudate, no hemorrhage and no papilledema.  Cardiovascular: Normal rate, regular rhythm and normal heart sounds.   No murmur heard.      ECG: sinus bradycardia/NSR, rate 59; no ST or T wave changes, no acute abnormalities  Orthostatic vital signs normal  Pulmonary/Chest: Breath sounds normal. She has no decreased breath sounds. She has no wheezes. She has no rhonchi.  Neurological: She is alert and oriented to person, place, and time. She has normal strength. No cranial nerve deficit or sensory deficit. She displays a negative Romberg sign. Coordination and gait normal.       Negative Epley maneuver to LEFT; mild nystagmus and reproduction of symptoms with  Epley maneuver to the RIGHT    ED Course  Procedures (including critical care time)  Labs Reviewed - No data to display No results found.   1. Benign paroxysmal positional vertigo       MDM  Mild reproduction of symptoms with Epley maneuver to the RIGHT; no acute findings on ECG, physical exam including complete neurological exam with funduscopic exam (normal); no new medications, hx and findings likely suggest BPPV; rx given for meclizine with strict instructions to return to care immediately should symptoms not improve, or worsen in any way        Renaee Munda, MD 10/06/11 1925

## 2011-10-06 NOTE — Telephone Encounter (Signed)
I agree with advice to go to [facility]

## 2011-10-06 NOTE — Telephone Encounter (Signed)
[facility] Triage Call Report Triage Record Num: [id number] Operator: Baldomero Lamy Patient Name: Veronica Ward Call Date & Time: 10/06/2011 12:01:22PM Patient Phone: [phone] PCP: Audrie Gallus. Tower Patient Gender: Female PCP Fax : Patient DOB: 1946/06/09 Practice Name: [facility] Reason for Call: Caller: [name]/Spouse; PCP: Roxy Manns A.; CB#: [phone]; ; ; Call regarding Tx Question; Husband/Mell calling; wants to know if they should go to [facility] as previously instructed. Advised that if triager advised that earlier, they shoudl still go. No change in Vertigo sxs other than pt has vomited 2 more x. Protocol(s) Used: Information Only Call; No Symptom Triage (Adult) Recommended Outcome per Protocol: Provide Information or Advice Only Reason for Outcome: Follow-up call to recent contact; no triage required. Information provided from past call documentation, approved references or experience. Care Advice: ~ 03/

## 2011-11-06 ENCOUNTER — Other Ambulatory Visit: Payer: Self-pay | Admitting: *Deleted

## 2011-11-06 MED ORDER — FA-PYRIDOXINE-CYANOCOBALAMIN 2.5-25-2 MG PO TABS: 1.0000 | ORAL_TABLET | Freq: Every day | ORAL | Status: DC

## 2011-11-06 NOTE — Telephone Encounter (Signed)
Received faxed refill request from pharmacy. Refill sent to pharmacy electronically. 

## 2011-11-10 ENCOUNTER — Ambulatory Visit (INDEPENDENT_AMBULATORY_CARE_PROVIDER_SITE_OTHER): Payer: BC Managed Care – PPO | Admitting: Family Medicine

## 2011-11-10 ENCOUNTER — Encounter: Payer: Self-pay | Admitting: Family Medicine

## 2011-11-10 VITALS — BP 110/60 | HR 63 | Temp 97.7°F | Ht 63.0 in | Wt 100.1 lb

## 2011-11-10 DIAGNOSIS — M545 Low back pain, unspecified: Secondary | ICD-10-CM

## 2011-11-10 DIAGNOSIS — K649 Unspecified hemorrhoids: Secondary | ICD-10-CM

## 2011-11-10 MED ORDER — HYDROCORTISONE 2.5 % RE CREA: TOPICAL_CREAM | Freq: Two times a day (BID) | RECTAL | Status: AC

## 2011-11-10 NOTE — Assessment & Plan Note (Signed)
Prolapsed int hem at 12:00 - soft and non thrombosed  Will tx with anusol hc cream / tuks wipes / hygiene and avoid straining  Update if not starting to improve in a week or if worsening

## 2011-11-10 NOTE — Assessment & Plan Note (Signed)
Right SI area after traveling and walking  Recommend stretch/ heat and update if not imp within the week

## 2011-11-10 NOTE — Progress Notes (Signed)
Subjective:    Patient ID: Veronica Ward, female    DOB: May 01, 1946, 66 y.o.   MRN: [id number]  HPI Here for hemorrhoid problem  Has had problems for a long time  Made 2 trips in a row - tends to be constipated  Was walking a lot - and wore pants that were uncomfortable   2 d ago - a little streak of blood in her stool/ when she wiped  Bright red  Hemorrhoid sticks out and is bothersome  Is difficult cleaning herself after BM Is back to regular bms   Now is painful in the rectal area- is a dull ache   No hx of hemorrhoid surgery in the past  No abd pain   Routine colonoscopy is due in a year    In addition has some pain over R outer hip Hurts to [facility] when still Is a dull ache Still walks several miles per day No numbness or weakness   Is going into phased retirement - so stress level will be decreasing  Patient Active Problem List  Diagnoses  . BASAL CELL CARCINOMA, FACE  . STRESS REACTION, ACUTE, WITH EMOTIONAL DISTURBANCE  . HEMORRHOIDS  . FIBROCYSTIC BREAST DISEASE  . PSORIASIS  . SEBORRHEIC KERATOSIS  . URTICARIA  . TOE PAIN  . OSTEOPENIA  . SLEEP DISORDER  . MALAISE AND FATIGUE  . HEADACHE  . BASAL CELL CARCINOMA, FACE  . Encounter for medication monitoring  . Weight loss  . Screening for lipoid disorders  . Diabetes mellitus screening  . Other screening mammogram  . Gynecological examination  . History of cervical cancer  . Low back pain   Past Medical History  Diagnosis Date  . Hx of basal cell carcinoma     face  . Diffuse cystic mastopathy   . Headache   . Unspecified hemorrhoids without mention of complication   . Other malaise and fatigue   . Disorder of bone and cartilage, unspecified   . Other psoriasis   . Other seborrheic keratosis   . Sleep disturbance, unspecified   . Predominant disturbance of emotions   . Pain in limb   . Urticaria, unspecified   . History of shingles   . Anxiety   . Anemia   . Dysautonomia     mild    Past Surgical History  Procedure Date  . Cervical conization w/bx   . Cervical cancer 1973  . Breast biopsy 2001    benign  . Tubal ligation   . Dexa 03/2002    oseopenia  . Colonoscopy 12/04    int. hemorrhoids  . Dexa 07/2004    decreased BMD, osteopenia  . Dexa 02/2007    Osteopenia  . Breast lumpectomy     left underarm  . Birthmark removal childhood  . Dexa 9/10    Osteopenia slightly worse  . Felon i&d 12/2010    Dr. Merlyn Lot, I&D in OR (L index finger)  . Finger surgery   . Knee surgery     right   History  Substance Use Topics  . Smoking status: Former Smoker    Quit date: 07/25/1963  . Smokeless tobacco: Never Used  . Alcohol Use: Yes     1/2 glass of wine a day   Family History  Problem Relation Age of Onset  . Multiple myeloma Father   . Coronary artery disease Father   . Transient ischemic attack Mother   . Lupus Maternal Aunt   . Prostate cancer  Maternal Uncle   . Cancer Maternal Grandfather   . Multiple sclerosis Brother   . Breast cancer      Aunt x2   No Known Allergies Current Outpatient Prescriptions on File Prior to Visit  Medication Sig Dispense Refill  . folic acid-pyridoxine-cyancobalamin (FOLBIC) 2.5-25-2 MG TABS Take 1 tablet by mouth daily.  30 each  4     Review of Systems Review of Systems  Constitutional: Negative for fever, appetite change, fatigue and unexpected weight change.  Eyes: Negative for pain and visual disturbance.  Respiratory: Negative for cough and shortness of breath.   Cardiovascular: Negative for cp or palpitations    Gastrointestinal: Negative for nausea, diarrhea and pos for constipation/ hemorroids/ occ brb in stool (no dark stool) Genitourinary: Negative for urgency and frequency. neg for dysuria  Skin: Negative for pallor or rash   MSK pos for R sided low back pain, neg for joint swelling or redness Neurological: Negative for weakness, light-headedness, numbness and headaches.  Hematological: Negative for  adenopathy. Does not bruise/bleed easily.  Psychiatric/Behavioral: Negative for dysphoric mood. The patient is not nervous/anxious.          Objective:   Physical Exam  Constitutional: She is oriented to person, place, and time. She appears well-developed and well-nourished. No distress.       Slim and well appearing   HENT:  Head: Normocephalic and atraumatic.  Mouth/Throat: Oropharynx is clear and moist.  Eyes: Conjunctivae and EOM are normal. Pupils are equal, round, and reactive to light. No scleral icterus.  Neck: Normal range of motion. Neck supple. No JVD present. No thyromegaly present.  Cardiovascular: Normal rate, regular rhythm and normal heart sounds.   Pulmonary/Chest: Breath sounds normal.  Abdominal: Soft. Bowel sounds are normal. She exhibits no distension and no mass. There is no tenderness.       No suprapubic tenderness    Genitourinary: Rectal exam shows internal hemorrhoid. Rectal exam shows no fissure, no tenderness and anal tone normal. Guaiac negative stool.       Prolapsed int hem at 12:00 pos-nt and not thrombosed Nl rectal exam and anoscopy  Nl tone   Musculoskeletal: She exhibits tenderness. She exhibits no edema.       Tenderness in R SI area No spinal bony tenderness No tronchanteric tenderness Neg SLR and nl rom hips Nl rom spine  Lymphadenopathy:    She has no cervical adenopathy.  Neurological: She is alert and oriented to person, place, and time. She has normal reflexes. No cranial nerve deficit. She exhibits normal muscle tone. Coordination normal.  Skin: Skin is warm and dry. No rash noted. No erythema. No pallor.  Psychiatric: She has a normal mood and affect.          Assessment & Plan:

## 2011-11-10 NOTE — Patient Instructions (Addendum)
Avoid straining as much as possible  Use heat on your back - stretch as much as you can  Wipe with TUKS wipes or baby wipes over the counter  Use the anusol ac cream twice daily for hemorrhoids for 10 days  Update if not starting to improve in a week or if worsening

## 2011-12-23 DIAGNOSIS — E876 Hypokalemia: Secondary | ICD-10-CM | POA: Diagnosis not present

## 2011-12-23 DIAGNOSIS — N3000 Acute cystitis without hematuria: Secondary | ICD-10-CM | POA: Diagnosis not present

## 2011-12-23 DIAGNOSIS — R1110 Vomiting, unspecified: Secondary | ICD-10-CM | POA: Diagnosis not present

## 2011-12-27 ENCOUNTER — Telehealth: Payer: Self-pay | Admitting: *Deleted

## 2011-12-27 NOTE — Telephone Encounter (Signed)
Caller reports that patient is being seen at [facility] and would like to touch base with you regarding this matter/SLS Please call number given to reach operator and request to be connected to MD Pager [phone]

## 2011-12-27 NOTE — Telephone Encounter (Signed)
I spoke to him - she was hospitalized for food poisoning after eating fish out of town - n/v/d/ and dehydration  Headache got worse when she was dehydrated  She is much better now and she will f/u with me when she returns I thanked him for the heads up

## 2012-01-02 ENCOUNTER — Telehealth: Payer: Self-pay

## 2012-01-02 ENCOUNTER — Ambulatory Visit (INDEPENDENT_AMBULATORY_CARE_PROVIDER_SITE_OTHER)
Admission: RE | Admit: 2012-01-02 | Discharge: 2012-01-02 | Disposition: A | Discharge status: Home / Self Care | Payer: BC Managed Care – PPO | Source: Ambulatory Visit | Attending: Family Medicine | Admitting: Family Medicine

## 2012-01-02 ENCOUNTER — Ambulatory Visit (INDEPENDENT_AMBULATORY_CARE_PROVIDER_SITE_OTHER): Payer: BC Managed Care – PPO | Admitting: Family Medicine

## 2012-01-02 ENCOUNTER — Encounter: Payer: Self-pay | Admitting: Family Medicine

## 2012-01-02 VITALS — BP 108/68 | HR 80 | Temp 97.9°F | Ht 63.5 in | Wt 104.2 lb

## 2012-01-02 DIAGNOSIS — M79601 Pain in right arm: Secondary | ICD-10-CM

## 2012-01-02 DIAGNOSIS — M25519 Pain in unspecified shoulder: Secondary | ICD-10-CM

## 2012-01-02 DIAGNOSIS — M25511 Pain in right shoulder: Secondary | ICD-10-CM

## 2012-01-02 DIAGNOSIS — M79609 Pain in unspecified limb: Secondary | ICD-10-CM

## 2012-01-02 DIAGNOSIS — A09 Infectious gastroenteritis and colitis, unspecified: Secondary | ICD-10-CM | POA: Insufficient documentation

## 2012-01-02 LAB — CBC WITH DIFFERENTIAL/PLATELET
Basophils Absolute: 0 10*3/uL (ref 0.0–0.1)
Basophils Relative: 0.6 % (ref 0.0–3.0)
Eosinophils Absolute: 0.1 10*3/uL (ref 0.0–0.7)
Eosinophils Relative: 2 % (ref 0.0–5.0)
HCT: 34.6 % — ABNORMAL LOW (ref 36.0–46.0)
Hemoglobin: 11.4 g/dL — ABNORMAL LOW (ref 12.0–15.0)
Lymphocytes Relative: 25.5 % (ref 12.0–46.0)
Lymphs Abs: 1.2 10*3/uL (ref 0.7–4.0)
MCHC: 32.8 g/dL (ref 30.0–36.0)
MCV: 99.8 fl (ref 78.0–100.0)
Monocytes Absolute: 0.4 10*3/uL (ref 0.1–1.0)
Monocytes Relative: 9.2 % (ref 3.0–12.0)
Neutro Abs: 2.8 10*3/uL (ref 1.4–7.7)
Neutrophils Relative %: 62.7 % (ref 43.0–77.0)
Platelets: 223 10*3/uL (ref 150.0–400.0)
RBC: 3.47 Mil/uL — ABNORMAL LOW (ref 3.87–5.11)
RDW: 13.2 % (ref 11.5–14.6)
WBC: 4.5 10*3/uL (ref 4.5–10.5)

## 2012-01-02 LAB — CK: Total CK: 47 U/L (ref 7–177)

## 2012-01-02 MED ORDER — IBUPROFEN 800 MG PO TABS: 800.0000 mg | ORAL_TABLET | Freq: Three times a day (TID) | ORAL | Status: AC | PRN

## 2012-01-02 MED ORDER — MELOXICAM 15 MG PO TABS: 15.0000 mg | ORAL_TABLET | Freq: Every day | ORAL | Status: DC

## 2012-01-02 NOTE — Progress Notes (Signed)
Subjective:    Patient ID: Veronica Ward, female    DOB: 01/22/1946, 66 y.o.   MRN: [id number]  HPI Here for R arm pain  Right shoulder/ top of shoulder (mostly in R ) -occ in left  Improved from hospital but still pretty darn bad  The nsaid and tylenol helps at some   R lower leg still feels puffy    vicodin made her nauseated  Tylenol, ibuprofen were given  Wears off in the middle of the night   While in [place] was hosp for food poisoning Ate fish - vomiting began 2 hours of eating it , along with her daughter  Also had UTI- tx with bactrim and got a rash  Also had venous doppler of legs -neg for blood clots  Got weak and had pain all over  Ended up in [facility] hospital   Is getting better from it - except pain in her arm   Patient Active Problem List  Diagnosis  . BASAL CELL CARCINOMA, FACE  . STRESS REACTION, ACUTE, WITH EMOTIONAL DISTURBANCE  . HEMORRHOIDS  . FIBROCYSTIC BREAST DISEASE  . PSORIASIS  . SEBORRHEIC KERATOSIS  . URTICARIA  . TOE PAIN  . OSTEOPENIA  . SLEEP DISORDER  . MALAISE AND FATIGUE  . HEADACHE  . BASAL CELL CARCINOMA, FACE  . Encounter for medication monitoring  . Weight loss  . Screening for lipoid disorders  . Diabetes mellitus screening  . Other screening mammogram  . Gynecological examination  . History of cervical cancer  . Low back pain  . Right shoulder pain  . Right arm pain  . Gastroenteritis, infectious   Past Medical History  Diagnosis Date  . Hx of basal cell carcinoma     face  . Diffuse cystic mastopathy   . Headache   . Unspecified hemorrhoids without mention of complication   . Other malaise and fatigue   . Disorder of bone and cartilage, unspecified   . Other psoriasis   . Other seborrheic keratosis   . Sleep disturbance, unspecified   . Predominant disturbance of emotions   . Pain in limb   . Urticaria, unspecified   . History of shingles   . Anxiety   . Anemia   . Dysautonomia     mild   Past  Surgical History  Procedure Date  . Cervical conization w/bx   . Cervical cancer 1973  . Breast biopsy 2001    benign  . Tubal ligation   . Dexa 03/2002    oseopenia  . Colonoscopy 12/04    int. hemorrhoids  . Dexa 07/2004    decreased BMD, osteopenia  . Dexa 02/2007    Osteopenia  . Breast lumpectomy     left underarm  . Birthmark removal childhood  . Dexa 9/10    Osteopenia slightly worse  . Felon i&d 12/2010    Dr. Merlyn Lot, I&D in OR (L index finger)  . Finger surgery   . Knee surgery     right   History  Substance Use Topics  . Smoking status: Former Smoker    Quit date: 07/25/1963  . Smokeless tobacco: Never Used  . Alcohol Use: Yes     1/2 glass of wine a day   Family History  Problem Relation Age of Onset  . Multiple myeloma Father   . Coronary artery disease Father   . Transient ischemic attack Mother   . Lupus Maternal Aunt   . Prostate cancer Maternal Uncle   .  Cancer Maternal Grandfather   . Multiple sclerosis Brother   . Breast cancer      Aunt x2   Allergies  Allergen Reactions  . Bactrim (Sulfamethoxazole W-Trimethoprim) Rash  . Sulfa Antibiotics Rash   Current Outpatient Prescriptions on File Prior to Visit  Medication Sig Dispense Refill  . calcium-vitamin D (OSCAL) 250-125 MG-UNIT per tablet Take 1 tablet by mouth daily.      . folic acid-pyridoxine-cyancobalamin (FOLBIC) 2.5-25-2 MG TABS Take 1 tablet by mouth daily.  30 each  4          Review of Systems Review of Systems  Constitutional: Negative for fever, appetite change,  and unexpected weight change. pos for fatigue  Eyes: Negative for pain and visual disturbance.  Respiratory: Negative for cough and shortness of breath.   Cardiovascular: Negative for cp or palpitations    Gastrointestinal: Negative for nausea, diarrhea and constipation. n/v/d is resolved and pt is slowly advancing diet  Genitourinary: Negative for urgency and frequency.  Skin: Negative for pallor or rash   MSK  pos for severe R shoulder and upper arm pain without redness or swelling/ pos for reduced ROM Neurological: Negative for weakness, light-headedness, numbness and headaches.  Hematological: Negative for adenopathy. Does not bruise/bleed easily.  Psychiatric/Behavioral: Negative for dysphoric mood. The patient is not nervous/anxious.         Objective:   Physical Exam  Constitutional: She appears well-developed and well-nourished. No distress.       Slim and fatigued appearing female  HENT:  Head: Normocephalic and atraumatic.  Mouth/Throat: Oropharynx is clear and moist.  Eyes: Conjunctivae and EOM are normal. Pupils are equal, round, and reactive to light. No scleral icterus.  Neck: Normal range of motion. Neck supple. No JVD present. Carotid bruit is not present. No thyromegaly present.  Cardiovascular: Normal rate, regular rhythm, normal heart sounds and intact distal pulses.  Exam reveals no gallop.   Pulmonary/Chest: Effort normal and breath sounds normal. No respiratory distress. She has no wheezes.  Abdominal: Soft. Bowel sounds are normal. She exhibits no distension, no abdominal bruit and no mass. There is no tenderness. There is no rebound and no guarding.  Musculoskeletal: She exhibits tenderness. She exhibits no edema.       Right shoulder: She exhibits decreased range of motion, tenderness, bony tenderness, pain and spasm. She exhibits no swelling, no effusion, no crepitus, no deformity, normal pulse and normal strength.       Right upper arm: She exhibits tenderness. She exhibits no bony tenderness, no swelling, no edema and no laceration.       R shoulder  Pos Hawking and Neer tests  Limited internal and ext rotation due to pain  Can abduct less than 90 deg (passive or active) due to pain Some tenderness over deltoid area   Lymphadenopathy:    She has no cervical adenopathy.  Neurological: She is alert. She has normal strength and normal reflexes. She displays no atrophy  and no tremor. No cranial nerve deficit or sensory deficit. She exhibits normal muscle tone. Coordination normal.  Skin: Skin is warm and dry. No rash noted. No erythema. No pallor.       Brisk capillary refil   Psychiatric: She has a normal mood and affect.          Assessment & Plan:

## 2012-01-02 NOTE — Telephone Encounter (Signed)
Pt seen today; took Meloxicam went to sleep one hour; when woke up severe pain rt shoulder and arm. Pain level now 8. Hard to function due to pain and pt concerned will not sleep tonight. Please advise. [place]

## 2012-01-02 NOTE — Patient Instructions (Signed)
Labs today Xray today Please send for hospital records/ discharge summary from [facility]  Try the meloxicam once daily with food instead of the ibuprofen

## 2012-01-02 NOTE — Telephone Encounter (Signed)
Patient informed/SLS  

## 2012-01-02 NOTE — Telephone Encounter (Signed)
Lets go back to the ibuprofen (she had 600 , I will inc to 800)- can take this up to tid with food Will refill electronically to [facility] Do not take any more meloxicam

## 2012-01-03 ENCOUNTER — Telehealth: Payer: Self-pay | Admitting: Family Medicine

## 2012-01-03 DIAGNOSIS — M79601 Pain in right arm: Secondary | ICD-10-CM

## 2012-01-03 NOTE — Telephone Encounter (Signed)
Message copied by Judy Pimple on Wed Jan 03, 2012  5:36 PM ------      Message from: Veronica Ward      Created: Wed Jan 03, 2012  4:46 PM       Patient notified as instructed by telephone. Pt is willing to go to orthopedic in [place] or [place]; whichever Dr Milinda Antis thinks is best orthopedic.pt will wait to hear from pt care coordinator.

## 2012-01-03 NOTE — Telephone Encounter (Signed)
Ref to ortho done  

## 2012-01-06 NOTE — Assessment & Plan Note (Signed)
Have spoken to the physician in [place] who discharged her from the hospital- still waiting for records Sounds like food bourne illness Is recovered except for fatigue and fluid status is improved  Due to exhaustion - had some disc with pt and her husband about travel plans for the summer and whether they should take a break

## 2012-01-06 NOTE — Assessment & Plan Note (Signed)
No injury- but exam has features of rotator cuff pathology  Will change to mobic for nsaid for more long acting effect  Ice /heat recommended Xray today - normal - may need to see ortho ? If any connection to recent illness Check cpk and cbc today

## 2012-01-06 NOTE — Assessment & Plan Note (Signed)
See assessment for shoulder pain  deltiod tenderness Nl x ray May need ortho ref  Lab today

## 2012-01-17 ENCOUNTER — Telehealth: Payer: Self-pay

## 2012-01-17 ENCOUNTER — Ambulatory Visit (INDEPENDENT_AMBULATORY_CARE_PROVIDER_SITE_OTHER): Payer: BC Managed Care – PPO | Admitting: Family Medicine

## 2012-01-17 ENCOUNTER — Encounter: Payer: Self-pay | Admitting: Family Medicine

## 2012-01-17 ENCOUNTER — Ambulatory Visit (INDEPENDENT_AMBULATORY_CARE_PROVIDER_SITE_OTHER)
Admission: RE | Admit: 2012-01-17 | Discharge: 2012-01-17 | Disposition: A | Discharge status: Home / Self Care | Payer: BC Managed Care – PPO | Source: Ambulatory Visit | Attending: Family Medicine | Admitting: Family Medicine

## 2012-01-17 VITALS — BP 122/74 | HR 70 | Temp 98.2°F | Wt 98.8 lb

## 2012-01-17 DIAGNOSIS — M79672 Pain in left foot: Secondary | ICD-10-CM

## 2012-01-17 DIAGNOSIS — M79609 Pain in unspecified limb: Secondary | ICD-10-CM

## 2012-01-17 NOTE — Patient Instructions (Addendum)
I think you have fifth metatarsal bony bruise. Treat with anti inflammatory like advil as needed, rest of leg and elevation when able. xrays today to rule out fracture. If not improving as expected, let us know.

## 2012-01-17 NOTE — Telephone Encounter (Signed)
Left foot pain on and off; when moves foot stabbing pain little toe and side of foot, also feels swollen and pulling sensation.Pt going out of town tomorrow. Pt scheduled appt to see Dr Reece Agar today at 2:15.

## 2012-01-17 NOTE — Assessment & Plan Note (Addendum)
In h/o osteopenia.  Check xray today to r/o metatarsal fracture - negative for fracture Mild tenderness to palpation of peroneal tendon at ankle of left malleolus but main tenderness is along shaft of MT. Anticipate bony contusion of 5th dorsal metatarsal shaft from unknown trauma and not peroneal tendonitis. Treat with NSAIDs, rest, ice and elevation of foot. Has NSAIDs at home.  Update [place] if not improving as expected.

## 2012-01-17 NOTE — Progress Notes (Signed)
  Subjective:    Patient ID: Veronica Ward, female    DOB: 01-23-1946, 66 y.o.   MRN: [id number]  HPI CC: foot pain  Left lateral foot pain on and off for last 3 days.  Yesterday had sharp pulling pain left lateral foot.  Points to lateral pinky to lateral midfoot at 5th mid metatarsal.  Wears all kinds of shoes.    advil did help.  No leg swelling or pain.  No falls or inciting trauma No h/o gout. Not favoring one side more than the other when walking. H/o left toe fracture multiple times in past.  R RTC tear but likely old.  S/p steroid shot, which resolved R shoulder pain.  scheduled to leave tomorrow in [place] as daughter expecting.  Past Medical History  Diagnosis Date  . Hx of basal cell carcinoma     face  . Diffuse cystic mastopathy   . Headache   . Unspecified hemorrhoids without mention of complication   . Other malaise and fatigue   . Disorder of bone and cartilage, unspecified   . Other psoriasis   . Other seborrheic keratosis   . Sleep disturbance, unspecified   . Predominant disturbance of emotions   . Pain in limb   . Urticaria, unspecified   . History of shingles   . Anxiety   . Anemia   . Dysautonomia     mild     Review of Systems Per HPI    Objective:   Physical Exam  Nursing note and vitals reviewed. Constitutional: She appears well-developed and well-nourished. No distress.  Cardiovascular:  Pulses:      Dorsalis pedis pulses are 2+ on the right side, and 2+ on the left side.       Posterior tibial pulses are 2+ on the right side, and 2+ on the left side.  Musculoskeletal: She exhibits no edema.       No pain, FROM at ankles bilaterally Mild tenderness to palpation at area where peroneal tendon runs through ankle at angle of left malleolus Tender to palpation dorsal shaft of 5th MT on left, main tenderness distal shaft.  No pain at base of 5th MT.   No pain at 5th MTP joint. No calcaneal pain  Skin: Skin is warm and dry. No rash  noted.       Assessment & Plan:

## 2012-02-02 ENCOUNTER — Telehealth: Payer: Self-pay

## 2012-02-02 DIAGNOSIS — M79673 Pain in unspecified foot: Secondary | ICD-10-CM | POA: Insufficient documentation

## 2012-02-02 NOTE — Telephone Encounter (Signed)
Will ref to pod for foot pain  Pt care coordinator will call her

## 2012-02-02 NOTE — Telephone Encounter (Signed)
Informed patient

## 2012-02-02 NOTE — Telephone Encounter (Signed)
Pt seen 01/17/12 with foot problem; rest, elevating foot, ice and ibuprofen has helped but still swelling and pain. Pt request referral to foot doctor prior to 02/22/12 (pt going out of town) Saw foot dr in [place] 3 years ago cannot remember name.Please advise.

## 2012-04-19 ENCOUNTER — Other Ambulatory Visit: Payer: Self-pay | Admitting: Family Medicine

## 2012-04-24 ENCOUNTER — Telehealth: Payer: Self-pay

## 2012-04-24 NOTE — Telephone Encounter (Signed)
Pt left v/m it is time to schedule her mammogram. Pt received info from [facility] about mammogram vs [facility]D and [facility] office sent info about another new breast procedure. Pt request Dr Royden Purl advise if pt needs to get regular mammogram or what should do before pt schedules mammogram appt..Please advise.

## 2012-04-24 NOTE — Telephone Encounter (Signed)
If she can afford the 3 D mammogram it may be a good idea (we do not have a lot of info on these proceedures yet, however) Given her hx of breast lump and her fam hx of breast cancer

## 2012-04-25 NOTE — Telephone Encounter (Signed)
Advise pt that if she can afford the 3D mammogram it's a good idea given her hx and family hx, but we don't have a lot of info to give her about the proceedure , pt said she will call her ins to see if they cover it

## 2012-04-25 NOTE — Telephone Encounter (Signed)
Home # busy so left voicemail on cell #, will try to call back later

## 2012-05-03 ENCOUNTER — Ambulatory Visit: Payer: BC Managed Care – PPO

## 2012-05-06 ENCOUNTER — Ambulatory Visit (INDEPENDENT_AMBULATORY_CARE_PROVIDER_SITE_OTHER): Payer: BC Managed Care – PPO

## 2012-05-06 DIAGNOSIS — Z23 Encounter for immunization: Secondary | ICD-10-CM

## 2012-05-07 ENCOUNTER — Other Ambulatory Visit: Payer: Self-pay | Admitting: Family Medicine

## 2012-05-07 DIAGNOSIS — Z1231 Encounter for screening mammogram for malignant neoplasm of breast: Secondary | ICD-10-CM

## 2012-06-11 ENCOUNTER — Telehealth: Payer: Self-pay

## 2012-06-11 NOTE — Telephone Encounter (Signed)
Veronica Ward left v/m requesting letter written that pt was unable to travel on airplane with [place] January 24, 2012 to [place] ; pt hospitalized in June and has also seen orthopedist and podiatrist as well as Dr Milinda Antis.Please advise.

## 2012-06-11 NOTE — Telephone Encounter (Signed)
Note is done and in IN box  

## 2012-06-12 NOTE — Telephone Encounter (Signed)
Pt notified letter ready for pick up. 

## 2012-06-13 ENCOUNTER — Ambulatory Visit
Admission: RE | Admit: 2012-06-13 | Discharge: 2012-06-13 | Disposition: A | Discharge status: Home / Self Care | Payer: BC Managed Care – PPO | Source: Ambulatory Visit | Attending: Family Medicine | Admitting: Family Medicine

## 2012-06-13 DIAGNOSIS — Z1231 Encounter for screening mammogram for malignant neoplasm of breast: Secondary | ICD-10-CM

## 2012-06-18 ENCOUNTER — Encounter: Payer: Self-pay | Admitting: *Deleted

## 2012-08-14 ENCOUNTER — Telehealth: Payer: Self-pay | Admitting: Family Medicine

## 2012-08-14 ENCOUNTER — Encounter: Payer: Self-pay | Admitting: Family Medicine

## 2012-08-14 ENCOUNTER — Ambulatory Visit (INDEPENDENT_AMBULATORY_CARE_PROVIDER_SITE_OTHER): Payer: BC Managed Care – PPO | Admitting: Family Medicine

## 2012-08-14 VITALS — BP 108/72 | HR 71 | Temp 98.7°F | Wt 104.2 lb

## 2012-08-14 DIAGNOSIS — J069 Acute upper respiratory infection, unspecified: Secondary | ICD-10-CM

## 2012-08-14 MED ORDER — AMOXICILLIN-POT CLAVULANATE 875-125 MG PO TABS: 1.0000 | ORAL_TABLET | Freq: Two times a day (BID) | ORAL | Status: DC

## 2012-08-14 NOTE — Patient Instructions (Addendum)
Drink lots of fluids and rest I suspect you have a long lasting cold or you caught a second cold while traveling Voice rest is the best thing for hoarseness, and salt water gargle helps also  If you worsen- facial pain/ fever/ green nasal discharge- fill and take the augmentin  mucinex DM is helpful for cough Update if not starting to improve in a week or if worsening

## 2012-08-14 NOTE — Telephone Encounter (Signed)
Patient Information:  Caller Name: [place]  Phone: [phone]  Patient: Veronica Ward, Veronica Ward  Gender: Female  DOB: August 06, 1945  Age: 67 Years  PCP: Roxy Manns [facility])  Office Follow Up:  Does the office need to follow up with this patient?: No  Instructions For The Office: N/A  RN Note:  Traveled to [place] then [place] by air recently before symptoms began. Never had fever. Ongoing fatigue preventing return to work.  Symptoms  Reason For Call & Symptoms: Cold symptoms with nasal congestion, mild sore throat, fatigue, infrequent, dry cough, and new onset of hoarseness.  Feels "fragile."  Reviewed Health History In EMR: Yes  Reviewed Medications In EMR: Yes  Reviewed Allergies In EMR: Yes  Reviewed Surgeries / Procedures: Yes  Date of Onset of Symptoms: 08/01/2012  Treatments Tried: Bedrest, Claritin, drinking water, team vaporizer, advil  Treatments Tried Worked: No  Guideline(s) Used:  Influenza - Seasonal  Cough  Colds  Disposition Per Guideline:   Strep Test Only Visit Today or Tomorrow  Reason For Disposition Reached:   Sore throat present > 5 days  Advice Given:  For a Runny Nose With Profuse Discharge:   Nasal mucus and discharge helps to wash viruses and bacteria out of the nose and sinuses.  Humidifier:  If the air in your home is dry, use a cool-mist humidifier  Expected Course:   Fever may last 2-3 days  Nasal discharge 7-14 days  Cough up to 2-3 weeks.  Call Back If:  Fever lasts more than 3 days  Nasal discharge lasts more than 10 days  Cough lasts more than 3 weeks  You become worse  RN Overrode Recommendation:  Make Appointment  Needs MD evaluation  Appointment Scheduled:  08/14/2012 12:15:00 Appointment Scheduled Provider:  Roxy Manns [facility])

## 2012-08-14 NOTE — Telephone Encounter (Signed)
I will see her then  

## 2012-08-14 NOTE — Assessment & Plan Note (Signed)
Suspect one long lasting or perhaps 2 viral uris in row Disc symptomatic care - see instructions on AVS  Update if not starting to improve in a week or if worsening   Disc s/s sinusitis- if fever/ facial pain -will fill px for augmentin and update me

## 2012-08-14 NOTE — Progress Notes (Signed)
Subjective:    Patient ID: Veronica Ward, female    DOB: Apr 06, 1946, 67 y.o.   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 Patient Active Problem List  Diagnosis  . BASAL CELL CARCINOMA, FACE  . STRESS REACTION, ACUTE, WITH EMOTIONAL DISTURBANCE  . HEMORRHOIDS  . FIBROCYSTIC BREAST DISEASE  . PSORIASIS  . SEBORRHEIC KERATOSIS  . URTICARIA  . TOE PAIN  . OSTEOPENIA  . SLEEP DISORDER  . MALAISE AND FATIGUE  . HEADACHE  . BASAL CELL CARCINOMA, FACE  . Encounter for medication monitoring  . Weight loss  . Screening for lipoid disorders  . Diabetes mellitus screening  . Other screening mammogram  . Gynecological examination  . History of cervical cancer  . Low back pain  . Right shoulder pain  . Right arm pain  . Gastroenteritis, infectious  . Left foot pain  . Foot pain   Past Medical History  Diagnosis Date  . Hx of basal cell carcinoma     face  . Diffuse cystic mastopathy   . Headache   . Unspecified hemorrhoids without mention of complication   . Other malaise and fatigue   . Disorder of bone and cartilage, unspecified   . Other psoriasis   . Other seborrheic keratosis   . Sleep disturbance, unspecified   . Predominant disturbance of emotions   . Pain in limb   . Urticaria, unspecified   . History of shingles   . Anxiety   . Anemia   . Dysautonomia     mild   Past Surgical History  Procedure Date  . Cervical conization w/bx   . Cervical cancer 1973  . Breast biopsy 2001    benign  .  Tubal ligation   . Dexa 03/2002    oseopenia  . Colonoscopy 12/04    int. hemorrhoids  . Dexa 07/2004    decreased BMD, osteopenia  . Dexa 02/2007    Osteopenia  . Breast lumpectomy     left underarm  . Birthmark removal childhood  . Dexa 9/10    Osteopenia slightly worse  . Felon i&d 12/2010    Dr. Merlyn Lot, I&D in OR (L index finger)  . Finger surgery   . Knee surgery     right   History  Substance Use Topics  . Smoking status: Former Smoker    Quit date: 07/25/1963  . Smokeless tobacco: Never Used  . Alcohol Use: Yes     Comment: 1/2 glass of wine a day   Family History  Problem Relation Age of Onset  . Multiple myeloma Father   . Coronary artery disease Father   . Transient ischemic attack Mother   . Lupus Maternal Aunt   . Prostate cancer Maternal Uncle   . Cancer Maternal Grandfather   . Multiple sclerosis Brother   . Breast cancer      Aunt x2   Allergies  Allergen Reactions  . Bactrim (Sulfamethoxazole W-Trimethoprim) Rash  . Sulfa Antibiotics Rash   Current Outpatient Prescriptions on File Prior to Visit  Medication Sig Dispense Refill  . Acetaminophen 650 MG TABS Take 1 tablet by mouth every 4 (four) hours as needed.      . Ascorbic Acid (VITAMIN C) 100 MG tablet Take 100 mg by mouth daily.      . calcium-vitamin D (OSCAL) 250-125 MG-UNIT per tablet Take 1 tablet by mouth daily.      . cholecalciferol (VITAMIN D) 400 UNITS TABS Take by mouth.      . co-enzyme Q-10 30 MG capsule Take 30 mg by mouth 3 (three) times daily.      . fish oil-omega-3 fatty acids 1000 MG capsule Take by mouth daily.      . FOLBIC 2.5-25-2 MG TABS TAKE ONE (1) TABLET BY MOUTH EVERY      DAY  30 each  3  . Multiple Vitamin (MULTIVITAMIN) tablet Take 1 tablet by mouth daily.           Review of Systems Review of Systems  Constitutional: Negative for  unexpected weight change. pos for malaise and fatigue ENt pos for cong and rhinorrhea and sinus pressure  Eyes: Negative for pain  and visual disturbance.  Respiratory: Negative for wheeze  and shortness of breath.   Cardiovascular: Negative for cp or palpitations    Gastrointestinal: Negative for nausea, diarrhea and constipation.  Genitourinary: Negative for urgency and frequency.  Skin: Negative for pallor or rash   Neurological: Negative for weakness, light-headedness, numbness and headaches.  Hematological: Negative for adenopathy. Does not bruise/bleed easily.  Psychiatric/Behavioral: Negative for dysphoric mood. The patient is not nervous/anxious.         Objective:   Physical Exam  Constitutional: She appears well-developed and well-nourished. No distress.       Appears fatigued  HENT:  Head: Normocephalic and atraumatic.  Right Ear: External ear normal.  Left Ear: External ear normal.  Mouth/Throat: Oropharynx is clear and moist. No oropharyngeal exudate.       Nares are injected and congested   No sinus tenderness  Eyes: Conjunctivae normal and EOM are normal. Pupils are equal, round, and reactive to light. Right eye exhibits no discharge. Left eye exhibits no discharge.  Neck: Normal range of motion. Neck supple.  Cardiovascular: Normal rate and regular rhythm.   Pulmonary/Chest: Effort normal and breath sounds normal. No respiratory distress. She has no wheezes. She has no rales.       Harsh bs with few rhonchi  Lymphadenopathy:    She has no cervical adenopathy.  Neurological: She is alert.  Skin: Skin is warm and dry. No rash noted.  Psychiatric: She has a normal mood and affect.          Assessment & Plan:

## 2012-08-26 ENCOUNTER — Other Ambulatory Visit: Payer: Self-pay | Admitting: Family Medicine

## 2012-12-02 ENCOUNTER — Telehealth: Payer: Self-pay | Admitting: Family Medicine

## 2012-12-02 NOTE — Telephone Encounter (Signed)
[facility] Triage Call Report Triage Record Num: [id number] Operator: Ether Griffins Patient Name: Veronica Ward Call Date & Time: 11/30/2012 8:49:25PM Patient Phone: [phone] PCP: Audrie Gallus. Tower Patient Gender: Female PCP Fax : Patient DOB: Mar 26, 1946 Practice Name: [facility] Reason for Call: Caller: Michelle/Patient; PCP: Roxy Manns [facility]); CB#: [phone]; Calling about headache at [facility] throat,cough. Onset 11/29/12. Temp 100.5. Took Claritin and saline nasal spray without effect. Guideline: Upper Respiratory Infection. Disposition: Provide Home/Self Care. Reason for Disposition: All other situations. Home care advice given. Protocol(s) Used: Upper Respiratory Infection (URI) Recommended Outcome per Protocol: Provide Home/Self Care Reason for Outcome: All other situations Care Advice: ~ Use a cool mist humidifier to moisten air. Be sure to clean according to manufacturer's instructions. ~ Consider use of a saline nasal spray per package directions to help relieve nasal congestion. Drink more fluids -- water, low-sugar juices, tea and warm soup, especially chicken broth, are options. Avoid caffeinated or alcoholic beverages because they can increase the chance of dehydration. ~ Call provider immediately if temperature of 101.5 F (38.6 C) or more; any fever and immunocompromised; fatigue, cough with colored sputum, or if symptoms become worse, or last more than 10 days. ~ ~ SYMPTOM / CONDITION MANAGEMENT ~ INFECTION CONTROL ~ CAUTIONS Consider nonprescription decongestant (Sudafed, Drixoral) for relief of symptoms after checking with a provider, especially if there is a history of hypertension, hyperthyroidism, heart disease, diabetes, glaucoma, urinary retention caused by prostatic hypertrophy. ~ Analgesic/Antipyretic Advice - Acetaminophen: Consider acetaminophen as directed on label or by pharmacist/provider for pain or  fever PRECAUTIONS: - Use if there is no history of liver disease, alcoholism, or intake of three or more alcohol drinks per day - Only if approved by provider during pregnancy or when breastfeeding - During pregnancy, acetaminophen should not be taken more than 3 consecutive days without telling provider - Do not exceed recommended dose or frequency ~ Analgesic/Antipyretic Advice - NSAIDs: Consider aspirin, ibuprofen, naproxen or ketoprofen for pain or fever as directed on label or by pharmacist/provider. PRECAUTIONS: - If over 45 years of age, should not take longer than 1 week without consulting provider. EXCEPTIONS: - Should not be used if taking blood thinners or have bleeding problems. - Do not use if have history of sensitivity/allergy to any of these medications; or history of cardiovascular, ulcer, kidney, liver disease or diabetes unless approved by provider. - Do not exceed recommended dose or frequency. ~ Respiratory Hygiene: - Cover the nose/mouth tightly with a tissue when coughing or sneezing. - Use tissue 1 time and discard in the nearest waste receptacle. - Wash hands with soap and water or alcohol-based hand rub after coming into contact with respiratory secretions and contaminated objects/materials. ~ 11/30/2012 9:09:10PM Page 1 of 2 [id number] [facility] Triage Call Report Patient Name: Veronica Ward continuation page/s - Alternatively when no tissue is available, cough into the bend of the elbow. - Avoid touching your eyes, nose or mouth. Total water intake includes drinking water, water in beverages, and water contained in food. Fluids make up about 80% of the body's total hydration need. Individual fluid requirement to maintain hydration vary based on physical activity, environmental factors and illness. Limit fluids that contain sugar, caffeine, or alcohol. Urine will be very light yellow color when you drink enough fluids. ~ 11/30/2012 9:09:10PM Page 2  of 2 [email or web address]

## 2013-02-28 ENCOUNTER — Other Ambulatory Visit: Payer: Self-pay | Admitting: Family Medicine

## 2013-03-03 ENCOUNTER — Telehealth: Payer: Self-pay | Admitting: Family Medicine

## 2013-03-03 DIAGNOSIS — Z131 Encounter for screening for diabetes mellitus: Secondary | ICD-10-CM

## 2013-03-03 DIAGNOSIS — M899 Disorder of bone, unspecified: Secondary | ICD-10-CM

## 2013-03-03 DIAGNOSIS — Z0000 Encounter for general adult medical examination without abnormal findings: Secondary | ICD-10-CM | POA: Insufficient documentation

## 2013-03-03 DIAGNOSIS — Z5181 Encounter for therapeutic drug level monitoring: Secondary | ICD-10-CM

## 2013-03-03 NOTE — Telephone Encounter (Signed)
Message copied by Judy Pimple on Mon Mar 03, 2013  9:52 PM ------      Message from: Alvina Chou      Created: Wed Feb 26, 2013 12:41 PM      Regarding: Lab orders for Tuesday, 8.12.14       Patient is scheduled for CPX labs, please order future labs, Thanks , Terri       ------

## 2013-03-04 ENCOUNTER — Other Ambulatory Visit (INDEPENDENT_AMBULATORY_CARE_PROVIDER_SITE_OTHER): Payer: Medicare Other

## 2013-03-04 ENCOUNTER — Other Ambulatory Visit: Payer: Medicare Other

## 2013-03-04 DIAGNOSIS — M949 Disorder of cartilage, unspecified: Secondary | ICD-10-CM | POA: Diagnosis not present

## 2013-03-04 DIAGNOSIS — Z5181 Encounter for therapeutic drug level monitoring: Secondary | ICD-10-CM

## 2013-03-04 DIAGNOSIS — R634 Abnormal weight loss: Secondary | ICD-10-CM | POA: Diagnosis not present

## 2013-03-04 DIAGNOSIS — Z0000 Encounter for general adult medical examination without abnormal findings: Secondary | ICD-10-CM

## 2013-03-04 DIAGNOSIS — M899 Disorder of bone, unspecified: Secondary | ICD-10-CM

## 2013-03-04 DIAGNOSIS — R7989 Other specified abnormal findings of blood chemistry: Secondary | ICD-10-CM

## 2013-03-04 DIAGNOSIS — Z131 Encounter for screening for diabetes mellitus: Secondary | ICD-10-CM

## 2013-03-04 LAB — CBC WITH DIFFERENTIAL/PLATELET
Basophils Absolute: 0 10*3/uL (ref 0.0–0.1)
Basophils Relative: 0.4 % (ref 0.0–3.0)
Eosinophils Absolute: 0.1 10*3/uL (ref 0.0–0.7)
Eosinophils Relative: 2.9 % (ref 0.0–5.0)
HCT: 36.9 % (ref 36.0–46.0)
Hemoglobin: 12.5 g/dL (ref 12.0–15.0)
Lymphocytes Relative: 23.8 % (ref 12.0–46.0)
Lymphs Abs: 1.1 10*3/uL (ref 0.7–4.0)
MCHC: 33.8 g/dL (ref 30.0–36.0)
MCV: 98.4 fl (ref 78.0–100.0)
Monocytes Absolute: 0.4 10*3/uL (ref 0.1–1.0)
Monocytes Relative: 8.9 % (ref 3.0–12.0)
Neutro Abs: 2.9 10*3/uL (ref 1.4–7.7)
Neutrophils Relative %: 64 % (ref 43.0–77.0)
Platelets: 149 10*3/uL — ABNORMAL LOW (ref 150.0–400.0)
RBC: 3.75 Mil/uL — ABNORMAL LOW (ref 3.87–5.11)
RDW: 13 % (ref 11.5–14.6)
WBC: 4.6 10*3/uL (ref 4.5–10.5)

## 2013-03-04 LAB — COMPREHENSIVE METABOLIC PANEL
ALT: 16 U/L (ref 0–35)
AST: 23 U/L (ref 0–37)
Albumin: 4.1 g/dL (ref 3.5–5.2)
Alkaline Phosphatase: 59 U/L (ref 39–117)
BUN: 15 mg/dL (ref 6–23)
CO2: 28 mEq/L (ref 19–32)
Calcium: 9.3 mg/dL (ref 8.4–10.5)
Chloride: 106 mEq/L (ref 96–112)
Creatinine, Ser: 0.6 mg/dL (ref 0.4–1.2)
GFR: 101.41 mL/min (ref >60.00)
Glucose, Bld: 96 mg/dL (ref 70–99)
Potassium: 3.9 mEq/L (ref 3.5–5.1)
Sodium: 139 mEq/L (ref 135–145)
Total Bilirubin: 0.8 mg/dL (ref 0.3–1.2)
Total Protein: 7 g/dL (ref 6.0–8.3)

## 2013-03-04 LAB — LIPID PANEL
Cholesterol: 202 mg/dL — ABNORMAL HIGH (ref 0–200)
HDL: 67.5 mg/dL (ref >39.00)
Total CHOL/HDL Ratio: 3
Triglycerides: 75 mg/dL (ref 0.0–149.0)
VLDL: 15 mg/dL (ref 0.0–40.0)

## 2013-03-04 LAB — TSH: TSH: 2.29 u[IU]/mL (ref 0.35–5.50)

## 2013-03-04 LAB — LDL CHOLESTEROL, DIRECT: Direct LDL: 119.6 mg/dL

## 2013-03-05 LAB — VITAMIN D 25 HYDROXY (VIT D DEFICIENCY, FRACTURES): Vit D, 25-Hydroxy: 66 ng/mL (ref 30–89)

## 2013-03-12 ENCOUNTER — Ambulatory Visit (INDEPENDENT_AMBULATORY_CARE_PROVIDER_SITE_OTHER): Payer: Medicare Other | Admitting: Family Medicine

## 2013-03-12 ENCOUNTER — Other Ambulatory Visit (HOSPITAL_COMMUNITY)
Admission: RE | Admit: 2013-03-12 | Discharge: 2013-03-12 | Disposition: A | Discharge status: Home / Self Care | Payer: Medicare Other | Source: Ambulatory Visit | Attending: Family Medicine | Admitting: Family Medicine

## 2013-03-12 ENCOUNTER — Encounter: Payer: Self-pay | Admitting: Family Medicine

## 2013-03-12 VITALS — BP 112/68 | HR 75 | Temp 98.6°F | Ht 63.0 in | Wt 104.0 lb

## 2013-03-12 DIAGNOSIS — Z1151 Encounter for screening for human papillomavirus (HPV): Secondary | ICD-10-CM | POA: Diagnosis not present

## 2013-03-12 DIAGNOSIS — Z1211 Encounter for screening for malignant neoplasm of colon: Secondary | ICD-10-CM | POA: Insufficient documentation

## 2013-03-12 DIAGNOSIS — Z01419 Encounter for gynecological examination (general) (routine) without abnormal findings: Secondary | ICD-10-CM

## 2013-03-12 DIAGNOSIS — M899 Disorder of bone, unspecified: Secondary | ICD-10-CM | POA: Diagnosis not present

## 2013-03-12 DIAGNOSIS — Z131 Encounter for screening for diabetes mellitus: Secondary | ICD-10-CM

## 2013-03-12 DIAGNOSIS — Z13220 Encounter for screening for lipoid disorders: Secondary | ICD-10-CM | POA: Diagnosis not present

## 2013-03-12 DIAGNOSIS — Z8541 Personal history of malignant neoplasm of cervix uteri: Secondary | ICD-10-CM | POA: Diagnosis not present

## 2013-03-12 DIAGNOSIS — Z0000 Encounter for general adult medical examination without abnormal findings: Secondary | ICD-10-CM

## 2013-03-12 NOTE — Patient Instructions (Addendum)
If you are interested in a shingles/zoster vaccine - call your insurance to check on coverage,( you should not get it within 1 month of other vaccines) , then call us for a prescription  for it to take to a pharmacy that gives the shot , or make a nurse visit to get it here depending on your coverage Stop at check out for referral for colonoscopy and also for bone density test Pap done today  Take care of yourself and stay active

## 2013-03-12 NOTE — Progress Notes (Signed)
Subjective:    Patient ID: Veronica Ward, female    DOB: [birth year], 67 y.o.   MRN: [id number]  HPI I have personally reviewed the Medicare Annual Wellness questionnaire and have noted 1. The patient's medical and social history 2. Their use of alcohol, tobacco or illicit drugs 3. Their current medications and supplements 4. The patient's functional ability including ADL's, fall risks, home safety risks and hearing or visual             impairment. 5. Diet and physical activities 6. Evidence for depression or mood disorders  The patients weight, height, BMI have been recorded in the chart and visual acuity is per eye clinic.  I have made referrals, counseling and provided education to the patient based review of the above and I have provided the pt with a written personalized care plan for preventive services.  Has been doing well overall  This week has had a bit of stomach issues and headache- thought she had a bad meal out to eat  Headache (more like pressure) last night/ and upset stomach/ a little bit of nausea and gas/ bloating  No blood in stool  She is trying to hydrate  Does drink one cup of coffee daily/ herbal tea at lunch and almond milk and then water     See scanned forms.  Routine anticipatory guidance given to patient.  See health maintenance. Flu 10/13 imm Shingles has not had the shot yet (has had the shingles)  PNA imm 9/12 imm Tetanus - got TDap 2.5 years ago Colonoscopy 12/04  Breast cancer screening- 11/13 normal - will make her own appt No breast lumps on self exam  Pap 9/12 with hx of cervical cancer - will do that today  Advance directive- has a living will   Cognitive function addressed- see scanned forms- and if abnormal then additional documentation follows. 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)  Falls- none   Mood - is good / upbeat -enjoys her 7 grandkids   Osteopenia She is always worried about her  posture  dexa 9/12 stable D level is 66  Lab Results  Component Value Date   CHOL 202* 03/04/2013   CHOL 197 03/29/2011   CHOL 185 12/21/2009   Lab Results  Component Value Date   HDL 67.50 03/04/2013   HDL 14.78 03/29/2011   HDL 66.50 12/21/2009   Lab Results  Component Value Date   LDLCALC 117* 03/29/2011   LDLCALC 109* 12/21/2009   LDLCALC 108* 10/08/2008   Lab Results  Component Value Date   TRIG 75.0 03/04/2013   TRIG 76.0 03/29/2011   TRIG 50.0 12/21/2009   Lab Results  Component Value Date   CHOLHDL 3 03/04/2013   CHOLHDL 3 03/29/2011   CHOLHDL 3 12/21/2009   Lab Results  Component Value Date   LDLDIRECT 119.6 03/04/2013   cholesterol is good   PMH and SH reviewed  Meds, vitals, and allergies reviewed.   ROS: See HPI.  Otherwise negative.     Patient Active Problem List   Diagnosis Date Noted  . Encounter for Medicare annual wellness exam 03/12/2013  . Colon cancer screening 03/12/2013  . Routine general medical examination at a health care facility 03/03/2013  . Foot pain 02/02/2012  . Left foot pain 01/17/2012  . Right shoulder pain 01/02/2012  . Right arm pain 01/02/2012  . Low back pain 11/10/2011  . Other screening mammogram 04/05/2011  .  Encounter for routine gynecological examination 04/05/2011  . History of cervical cancer 04/05/2011  . Screening for lipoid disorders 01/15/2011  . Diabetes mellitus screening 01/15/2011  . Encounter for medication monitoring 11/23/2010  . Weight loss 11/23/2010  . MALAISE AND FATIGUE 02/17/2010  . HEADACHE 02/17/2010  . TOE PAIN 12/29/2009  . SEBORRHEIC KERATOSIS 08/25/2009  . STRESS REACTION, ACUTE, WITH EMOTIONAL DISTURBANCE 12/09/2008  . SLEEP DISORDER 12/09/2008  . HEMORRHOIDS 10/12/2008  . OSTEOPENIA 10/08/2007  . BASAL CELL CARCINOMA, FACE 06/21/2007  . FIBROCYSTIC BREAST DISEASE 06/21/2007  . PSORIASIS 06/21/2007  . URTICARIA 06/21/2007  . BASAL CELL CARCINOMA, FACE 06/21/2007   Past Medical History   Diagnosis Date  . Hx of basal cell carcinoma     face  . Diffuse cystic mastopathy   . Headache(784.0)   . Unspecified hemorrhoids without mention of complication   . Other malaise and fatigue   . Disorder of bone and cartilage, unspecified   . Other psoriasis   . Other seborrheic keratosis   . Sleep disturbance, unspecified   . Predominant disturbance of emotions   . Pain in limb   . Urticaria, unspecified   . History of shingles   . Anxiety   . Anemia   . Dysautonomia     mild   Past Surgical History  Procedure Laterality Date  . Cervical conization w/bx    . Cervical cancer  1973  . Breast biopsy  2001    benign  . Tubal ligation    . Dexa  03/2002    oseopenia  . Colonoscopy  12/04    int. hemorrhoids  . Dexa  07/2004    decreased BMD, osteopenia  . Dexa  02/2007    Osteopenia  . Breast lumpectomy      left underarm  . Birthmark removal  childhood  . Dexa  9/10    Osteopenia slightly worse  . Felon i&d  12/2010    Dr. Merlyn Lot, I&D in OR (L index finger)  . Finger surgery    . Knee surgery      right   History  Substance Use Topics  . Smoking status: Former Smoker    Quit date: 07/25/1963  . Smokeless tobacco: Never Used  . Alcohol Use: Yes     Comment: 1/2 glass of wine a day   Family History  Problem Relation Age of Onset  . Multiple myeloma Father   . Coronary artery disease Father   . Transient ischemic attack Mother   . Lupus Maternal Aunt   . Prostate cancer Maternal Uncle   . Cancer Maternal Grandfather   . Multiple sclerosis Brother   . Breast cancer      Aunt x2   Allergies  Allergen Reactions  . Bactrim [Sulfamethoxazole W-Trimethoprim] Rash  . Sulfa Antibiotics Rash   Current Outpatient Prescriptions on File Prior to Visit  Medication Sig Dispense Refill  . Ascorbic Acid (VITAMIN C) 100 MG tablet Take 100 mg by mouth daily.      . calcium-vitamin D (OSCAL) 250-125 MG-UNIT per tablet Take 1 tablet by mouth 2 (two) times daily.        . cholecalciferol (VITAMIN D) 400 UNITS TABS Take by mouth.      . co-enzyme Q-10 30 MG capsule Take 30 mg by mouth 3 (three) times daily.      . fish oil-omega-3 fatty acids 1000 MG capsule Take by mouth 2 (two) times daily.       Clayborn Heron  2.5-25-2 MG TABS TAKE ONE (1) TABLET BY MOUTH EVERY DAY  30 each  0  . Multiple Vitamin (MULTIVITAMIN) tablet Take 1 tablet by mouth daily.       No current facility-administered medications on file prior to visit.    Review of Systems Review of Systems  Constitutional: Negative for fever, appetite change, fatigue and unexpected weight change.  Eyes: Negative for pain and visual disturbance.  Respiratory: Negative for cough and shortness of breath.   Cardiovascular: Negative for cp or palpitations    Gastrointestinal: Negative for nausea, diarrhea and constipation. (diarrhea has slowed down)- pos for gurgling stomach Genitourinary: Negative for urgency and frequency.  Skin: Negative for pallor or rash   Neurological: Negative for weakness, light-headedness, numbness and headaches.  Hematological: Negative for adenopathy. Does not bruise/bleed easily.  Psychiatric/Behavioral: Negative for dysphoric mood. The patient is not nervous/anxious.         Objective:   Physical Exam  Constitutional: She appears well-developed and well-nourished. No distress.  HENT:  Head: Normocephalic and atraumatic.  Right Ear: External ear normal.  Mouth/Throat: Oropharynx is clear and moist. No oropharyngeal exudate.  Eyes: Conjunctivae and EOM are normal. Pupils are equal, round, and reactive to light. Right eye exhibits no discharge. Left eye exhibits no discharge. No scleral icterus.  Neck: Normal range of motion. Neck supple. No JVD present. Carotid bruit is not present. No thyromegaly present.  Cardiovascular: Normal rate, regular rhythm, normal heart sounds and intact distal pulses.  Exam reveals no gallop.   Pulmonary/Chest: Effort normal and breath sounds  normal. No respiratory distress. She has no wheezes. She has no rales.  Abdominal: Soft. Bowel sounds are normal. She exhibits no distension, no abdominal bruit and no mass. There is no tenderness. There is no rebound and no guarding.  Genitourinary: No breast swelling, tenderness, discharge or bleeding.  o External genitalia nl appearing / no lesions o Urethral meatus nl appearing / no polyps o Urethra  Normally mobile o Bladder no cystocele o Vagina nl appearing/mucosa appears healthy,no bleeding o Cervix  Posterior/ no tenderness or polyps o Uterus  Not enlarged or fixed o Adnexa/parametria nl /non tender/no M o Anus and perineum-nl appearing  Breast exam: No mass, nodules, thickening, tenderness, bulging, retraction, inflamation, nipple discharge or skin changes noted.  No axillary or clavicular LA.  Chaperoned exam.     Musculoskeletal: She exhibits no edema.  Lymphadenopathy:    She has no cervical adenopathy.  Neurological: She is alert. She has normal reflexes. No cranial nerve deficit. She exhibits normal muscle tone. Coordination normal.  Skin: Skin is warm and dry. No rash noted. No erythema. No pallor.  Psychiatric: She has a normal mood and affect.          Assessment & Plan:

## 2013-03-13 NOTE — Assessment & Plan Note (Signed)
Exam done -pt has hx of cervical cancer (treated) in the past

## 2013-03-13 NOTE — Assessment & Plan Note (Signed)
Ref for screening colonosc  

## 2013-03-13 NOTE — Assessment & Plan Note (Signed)
Pap done today  No complaints or symptoms

## 2013-03-13 NOTE — Assessment & Plan Note (Signed)
Normal glucose Will work to [employer] wt

## 2013-03-13 NOTE — Assessment & Plan Note (Signed)
Disc goals for lipids and reasons to control them Rev labs with pt Rev low sat fat diet in detail -overall very good

## 2013-03-13 NOTE — Assessment & Plan Note (Signed)
Reviewed health habits including diet and exercise and skin cancer prevention Also reviewed health mt list, fam hx and immunizations  See HPI Good habits

## 2013-03-13 NOTE — Assessment & Plan Note (Signed)
Schedule dexa Rev ca and D Rev exercise No kyphosis

## 2013-03-19 ENCOUNTER — Ambulatory Visit: Payer: Medicare Other

## 2013-03-20 ENCOUNTER — Ambulatory Visit (INDEPENDENT_AMBULATORY_CARE_PROVIDER_SITE_OTHER): Payer: Medicare Other | Admitting: *Deleted

## 2013-03-20 DIAGNOSIS — Z23 Encounter for immunization: Secondary | ICD-10-CM | POA: Diagnosis not present

## 2013-03-20 DIAGNOSIS — Z2911 Encounter for prophylactic immunotherapy for respiratory syncytial virus (RSV): Secondary | ICD-10-CM

## 2013-03-29 ENCOUNTER — Other Ambulatory Visit: Payer: Self-pay | Admitting: Family Medicine

## 2013-04-02 ENCOUNTER — Other Ambulatory Visit: Payer: Self-pay | Admitting: Family Medicine

## 2013-04-02 DIAGNOSIS — Z1231 Encounter for screening mammogram for malignant neoplasm of breast: Secondary | ICD-10-CM

## 2013-04-08 ENCOUNTER — Ambulatory Visit
Admission: RE | Admit: 2013-04-08 | Discharge: 2013-04-08 | Disposition: A | Discharge status: Home / Self Care | Payer: Medicare Other | Source: Ambulatory Visit | Attending: Family Medicine | Admitting: Family Medicine

## 2013-04-08 DIAGNOSIS — M899 Disorder of bone, unspecified: Secondary | ICD-10-CM

## 2013-04-10 ENCOUNTER — Telehealth: Payer: Self-pay | Admitting: Family Medicine

## 2013-04-10 NOTE — Telephone Encounter (Signed)
Patient Information:  Caller Name: Tevis  Phone: [phone]  Patient: Veronica Ward, Veronica Ward  Gender: Female  DOB: June 02, 1946  Age: 67 Years  PCP: Roxy Manns [facility])  Office Follow Up:  Does the office need to follow up with this patient?: No  Instructions For The Office: N/A   Symptoms  Reason For Call & Symptoms: 04/08/13 she was sneezing and felt tired and week.  04/09/13 she rested. She had a lot of sinus drainage with slight cough.  Is this a cold or allergy?   She took a Claritin at 0230, 04/10/13 she awoke with facial pain and then she took Advil x 2.  Pain resolved , but still has some sinus congestion.  She is also going to [place] in October and wants to see about  prophylactic antibiotics and does not know what travel shots she needs,  so she is requesting an appt to get this information.  Reviewed Health History In EMR: Yes  Reviewed Medications In EMR: Yes  Reviewed Allergies In EMR: Yes  Reviewed Surgeries / Procedures: Yes  Date of Onset of Symptoms: 04/08/2013  Treatments Tried: Claritin  Treatments Tried Worked: No  Guideline(s) Used:  Sinus Pain and Congestion  Disposition Per Guideline:   Home Care  Reason For Disposition Reached:   Sinus congestion as part of a cold, present < 10 days  Advice Given:  Reassurance:   Sinus congestion is a normal part of a cold.  Usually home treatment with nasal washes can prevent an actual bacterial sinus infection.  Antibiotics are not helpful for the sinus congestion that occurs with colds.  For a Stuffy Nose - Use Nasal Washes:  Introduction: Saline (salt water) nasal irrigation (nasal wash) is an effective and simple home remedy for treating stuffy nose and sinus congestion. The nose can be irrigated by pouring, spraying, or squirting salt water into the nose and then letting it run back out.  Pain and Fever Medicines:  For pain or fever relief, take either acetaminophen or ibuprofen.  Hydration:  Drink plenty of  liquids (6-8 glasses of water daily). If the air in your home is dry, use a cool mist humidifier  Expected Course:  Sinus congestion from viral upper respiratory infections (colds) usually lasts 5-10 days.  Call Back If:   Severe pain lasts longer than 2 hours after pain medicine  Sinus pain lasts longer than 1 day after starting treatment using nasal washes  Sinus congestion (fullness) lasts longer than 10 days  Fever lasts longer than 3 days  You become worse.  Patient Will Follow Care Advice:  YES  Appt made with Dr, Milinda Antis 04/11/13 at 1145 to ask about prophylactic antibiotics and travel shots.

## 2013-04-11 ENCOUNTER — Encounter: Payer: Self-pay | Admitting: Family Medicine

## 2013-04-11 ENCOUNTER — Ambulatory Visit (INDEPENDENT_AMBULATORY_CARE_PROVIDER_SITE_OTHER): Payer: Medicare Other | Admitting: Family Medicine

## 2013-04-11 VITALS — BP 136/70 | HR 64 | Temp 98.5°F | Ht 63.0 in | Wt 104.0 lb

## 2013-04-11 DIAGNOSIS — J069 Acute upper respiratory infection, unspecified: Secondary | ICD-10-CM | POA: Diagnosis not present

## 2013-04-11 DIAGNOSIS — Z7189 Other specified counseling: Secondary | ICD-10-CM | POA: Diagnosis not present

## 2013-04-11 DIAGNOSIS — Z7184 Encounter for health counseling related to travel: Secondary | ICD-10-CM | POA: Insufficient documentation

## 2013-04-11 MED ORDER — CIPROFLOXACIN HCL 500 MG PO TABS: 500.0000 mg | ORAL_TABLET | Freq: Two times a day (BID) | ORAL | Status: DC

## 2013-04-11 NOTE — Patient Instructions (Addendum)
Take care of yourself  claritin for runny nose and sneezing if needed  For travel you should get flu shot and hepatitis A shot  Let the ladies know out front that your next nurse visit will be for a flu and a hep A vaccine  Call the health dept and ask about a typhoid vaccine  Here is px for cipro to use for traveler's diarrhea as needed

## 2013-04-11 NOTE — Progress Notes (Signed)
Subjective:    Patient ID: Veronica Ward, female    DOB: March 19, 1946, 67 y.o.   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 Encounter for Medicare annual wellness exam 03/12/2013  . Colon cancer screening 03/12/2013  . Routine general medical examination at a health care facility 03/03/2013  . Foot pain 02/02/2012  . Left foot pain 01/17/2012  . Right shoulder pain 01/02/2012  . Right arm pain 01/02/2012  . Low back pain 11/10/2011  . Other screening mammogram 04/05/2011  . Encounter for routine gynecological examination 04/05/2011  . History of cervical cancer 04/05/2011  . Screening for lipoid disorders 01/15/2011  . Diabetes mellitus screening 01/15/2011  . Encounter for medication monitoring 11/23/2010  . Weight loss 11/23/2010  . MALAISE AND FATIGUE 02/17/2010  . HEADACHE 02/17/2010  . TOE PAIN 12/29/2009  . SEBORRHEIC KERATOSIS 08/25/2009  . STRESS REACTION, ACUTE, WITH EMOTIONAL DISTURBANCE 12/09/2008  . SLEEP DISORDER 12/09/2008  . HEMORRHOIDS 10/12/2008  . OSTEOPENIA 10/08/2007  . BASAL CELL CARCINOMA, FACE 06/21/2007  . FIBROCYSTIC BREAST DISEASE 06/21/2007  . PSORIASIS 06/21/2007  . URTICARIA 06/21/2007  . BASAL CELL CARCINOMA, FACE 06/21/2007   Past Medical  History  Diagnosis Date  . Hx of basal cell carcinoma     face  . Diffuse cystic mastopathy   . Headache(784.0)   . Unspecified hemorrhoids without mention of complication   . Other malaise and fatigue   . Disorder of bone and cartilage, unspecified   . Other psoriasis   . Other seborrheic keratosis   . Sleep disturbance, unspecified   . Predominant disturbance of emotions   . Pain in limb   . Urticaria, unspecified   . History of shingles   . Anxiety   . Anemia   . Dysautonomia     mild   Past Surgical History  Procedure Laterality Date  . Cervical conization w/bx    . Cervical cancer  1973  . Breast biopsy  2001    benign  . Tubal ligation    . Dexa  03/2002    oseopenia  . Colonoscopy  12/04    int. hemorrhoids  . Dexa  07/2004    decreased BMD, osteopenia  . Dexa  02/2007    Osteopenia  . Breast lumpectomy      left underarm  . Birthmark removal  childhood  . Dexa  9/10    Osteopenia slightly worse  . Felon i&d  12/2010    Dr. Merlyn Lot, I&D in OR (L index finger)  . Finger surgery    . Knee surgery      right  History  Substance Use Topics  . Smoking status: Former Smoker    Quit date: 07/25/1963  . Smokeless tobacco: Never Used  . Alcohol Use: Yes     Comment: 1/2 glass of wine a day   Family History  Problem Relation Age of Onset  . Multiple myeloma Father   . Coronary artery disease Father   . Transient ischemic attack Mother   . Lupus Maternal Aunt   . Prostate cancer Maternal Uncle   . Cancer Maternal Grandfather   . Multiple sclerosis Brother   . Breast cancer      Aunt x2   Allergies  Allergen Reactions  . Bactrim [Sulfamethoxazole W-Trimethoprim] Rash  . Sulfa Antibiotics Rash   Current Outpatient Prescriptions on File Prior to Visit  Medication Sig Dispense Refill  . Ascorbic Acid (VITAMIN C) 100 MG tablet Take 100 mg by mouth daily.      . Biotin 1 MG CAPS Take 1 tablet by mouth daily.      . calcium-vitamin D (OSCAL) 250-125 MG-UNIT  per tablet Take 1 tablet by mouth 2 (two) times daily.       . cholecalciferol (VITAMIN D) 400 UNITS TABS Take by mouth.      . co-enzyme Q-10 30 MG capsule Take 30 mg by mouth 3 (three) times daily.      . fish oil-omega-3 fatty acids 1000 MG capsule Take by mouth 2 (two) times daily.       . FOLBIC 2.5-25-2 MG TABS TAKE 1 TABLET BY MOUTH DAILY  30 each  5  . ibuprofen (ADVIL,MOTRIN) 200 MG tablet Take 200 mg by mouth as needed for pain.      . Lactobacillus (ACIDOPHILUS) 100 MG CAPS Take 1 capsule by mouth daily.      . magnesium 30 MG tablet Take 30 mg by mouth daily.      . Multiple Vitamin (MULTIVITAMIN) tablet Take 1 tablet by mouth daily.       No current facility-administered medications on file prior to visit.      Review of Systems Review of Systems  Constitutional: Negative for fever, appetite change, fatigue and unexpected weight change.  ENT pos for runny nose/neg for facial or ear pain  Eyes: Negative for pain and visual disturbance.  Respiratory: Negative for cough and shortness of breath.   Cardiovascular: Negative for cp or palpitations    Gastrointestinal: Negative for nausea, diarrhea and constipation.  Genitourinary: Negative for urgency and frequency.  Skin: Negative for pallor or rash   Neurological: Negative for weakness, light-headedness, numbness and headaches.  Hematological: Negative for adenopathy. Does not bruise/bleed easily.  Psychiatric/Behavioral: Negative for dysphoric mood. The patient is not nervous/anxious.         Objective:   Physical Exam  Constitutional: She appears well-developed and well-nourished. No distress.  Slim and well app  HENT:  Head: Normocephalic and atraumatic.  Right Ear: External ear normal.  Mouth/Throat: Oropharynx is clear and moist. No oropharyngeal exudate.  Nares are injected and congested  No facial tenderness  Eyes: Conjunctivae and EOM are normal. Pupils are equal, round, and reactive to light. Right eye  exhibits no discharge. Left eye exhibits no discharge. No scleral icterus.  Neck: Normal range of motion. Neck supple. No thyromegaly present.  Cardiovascular: Normal rate and regular rhythm.   Pulmonary/Chest: Effort normal and breath sounds normal. No respiratory distress. She has no wheezes. She has no rales.  Lymphadenopathy:    She has no cervical adenopathy.  Neurological: She is alert.  Skin: Skin is warm and dry. No rash noted.  Psychiatric: She has a normal mood and affect.          Assessment & Plan:

## 2013-04-13 NOTE — Assessment & Plan Note (Signed)
Disc concerns for travel to [place]  Will come back for flu shot and hep A vaccine  She will call health dept about need for typhoid vaccine -poss Given px for cipro in case need for traveler's diarrhea

## 2013-04-13 NOTE — Assessment & Plan Note (Signed)
Overall getting much better  Disc symptomatic care - see instructions on AVS Update if not starting to improve in a week or if worsening

## 2013-04-21 ENCOUNTER — Encounter: Payer: Self-pay | Admitting: Gastroenterology

## 2013-04-22 ENCOUNTER — Ambulatory Visit (INDEPENDENT_AMBULATORY_CARE_PROVIDER_SITE_OTHER)
Admission: RE | Admit: 2013-04-22 | Discharge: 2013-04-22 | Disposition: A | Discharge status: Home / Self Care | Payer: Medicare Other | Source: Ambulatory Visit | Attending: Family Medicine | Admitting: Family Medicine

## 2013-04-22 DIAGNOSIS — M899 Disorder of bone, unspecified: Secondary | ICD-10-CM | POA: Diagnosis not present

## 2013-04-22 LAB — HM DEXA SCAN

## 2013-04-23 ENCOUNTER — Ambulatory Visit: Payer: Medicare Other

## 2013-04-23 ENCOUNTER — Telehealth: Payer: Self-pay

## 2013-04-23 ENCOUNTER — Ambulatory Visit (INDEPENDENT_AMBULATORY_CARE_PROVIDER_SITE_OTHER): Payer: Medicare Other | Admitting: *Deleted

## 2013-04-23 DIAGNOSIS — Z23 Encounter for immunization: Secondary | ICD-10-CM

## 2013-04-23 NOTE — Telephone Encounter (Signed)
I read that there is a 10% or less incidence of drowsiness/ headache/ decreased appetite/ local reaction at shot site or low grade fever  It that changes her mind about getting it -then cancel nurse visit  Of note- I have not had pt in my practice tell me of any side effects in the past

## 2013-04-23 NOTE — Telephone Encounter (Signed)
Pt's spouse notified and they are still getting vaccine

## 2013-04-23 NOTE — Telephone Encounter (Signed)
Pt left v/m requesting cb about possible side effects of Hep A prior to getting Hep A injection today at 3:15; discussed side effects listed on vaccine information sheet for Hep A vaccine. Pt is going to [place] on 04/26/13 and returning on 05/05/13. Pt wants to know what the odds are of pt getting side effects from Hep A. And is it worth pt taking the vaccine. Pt request cb prior to nurse visit today at 3:15 pm. 

## 2013-04-24 ENCOUNTER — Ambulatory Visit: Payer: Medicare Other

## 2013-05-15 ENCOUNTER — Encounter: Payer: Self-pay | Admitting: Family Medicine

## 2013-05-19 ENCOUNTER — Ambulatory Visit: Payer: Medicare Other | Admitting: Family Medicine

## 2013-05-20 ENCOUNTER — Ambulatory Visit: Payer: Medicare Other | Admitting: Family Medicine

## 2013-05-21 ENCOUNTER — Ambulatory Visit (INDEPENDENT_AMBULATORY_CARE_PROVIDER_SITE_OTHER): Payer: Medicare Other | Admitting: Family Medicine

## 2013-05-21 ENCOUNTER — Encounter: Payer: Self-pay | Admitting: Family Medicine

## 2013-05-21 VITALS — BP 116/82 | HR 68 | Temp 98.4°F | Ht 63.0 in | Wt 104.2 lb

## 2013-05-21 DIAGNOSIS — M899 Disorder of bone, unspecified: Secondary | ICD-10-CM

## 2013-05-21 MED ORDER — ALENDRONATE SODIUM 70 MG PO TABS: 70.0000 mg | ORAL_TABLET | ORAL | Status: DC

## 2013-05-21 NOTE — Patient Instructions (Signed)
If you decide to start the fosamax - here is the px  Take in am with a glass of water on an empty stomach and do not lie down for 30 minutes (once weekly)  If you have any side effects - stop it and let me know

## 2013-05-21 NOTE — Progress Notes (Signed)
Subjective:    Patient ID: Veronica Ward, female    DOB: [birth year], 67 y.o.   MRN: [id number]  HPI Here for f/u of OP  Recent dexa - T score of LS went down to -2.6 from -2.0  D level is 66  Does exercise   She has looked at her other options   Has a strong family history of OP   Has taken 5 years of evista   She does worry that she will break a bone in the future - she has not in the past   Patient Active Problem List   Diagnosis Date Noted  . Travel advice encounter 04/11/2013  . Viral URI 04/11/2013  . Encounter for Medicare annual wellness exam 03/12/2013  . Colon cancer screening 03/12/2013  . Routine general medical examination at a health care facility 03/03/2013  . Foot pain 02/02/2012  . Left foot pain 01/17/2012  . Right shoulder pain 01/02/2012  . Right arm pain 01/02/2012  . Low back pain 11/10/2011  . Other screening mammogram 04/05/2011  . Encounter for routine gynecological examination 04/05/2011  . History of cervical cancer 04/05/2011  . Screening for lipoid disorders 01/15/2011  . Diabetes mellitus screening 01/15/2011  . Encounter for medication monitoring 11/23/2010  . Weight loss 11/23/2010  . MALAISE AND FATIGUE 02/17/2010  . HEADACHE 02/17/2010  . TOE PAIN 12/29/2009  . SEBORRHEIC KERATOSIS 08/25/2009  . STRESS REACTION, ACUTE, WITH EMOTIONAL DISTURBANCE 12/09/2008  . SLEEP DISORDER 12/09/2008  . HEMORRHOIDS 10/12/2008  . Osteoporosis 10/08/2007  . BASAL CELL CARCINOMA, FACE 06/21/2007  . FIBROCYSTIC BREAST DISEASE 06/21/2007  . PSORIASIS 06/21/2007  . URTICARIA 06/21/2007  . BASAL CELL CARCINOMA, FACE 06/21/2007   Past Medical History  Diagnosis Date  . Hx of basal cell carcinoma     face  . Diffuse cystic mastopathy   . Headache(784.0)   . Unspecified hemorrhoids without mention of complication   . Other malaise and fatigue   . Disorder of bone and cartilage, unspecified   . Other psoriasis   . Other seborrheic keratosis   .  Sleep disturbance, unspecified   . Predominant disturbance of emotions   . Pain in limb   . Urticaria, unspecified   . History of shingles   . Anxiety   . Anemia   . Dysautonomia     mild   Past Surgical History  Procedure Laterality Date  . Cervical conization w/bx    . Cervical cancer  1973  . Breast biopsy  2001    benign  . Tubal ligation    . Dexa  03/2002    oseopenia  . Colonoscopy  12/04    int. hemorrhoids  . Dexa  07/2004    decreased BMD, osteopenia  . Dexa  02/2007    Osteopenia  . Breast lumpectomy      left underarm  . Birthmark removal  childhood  . Dexa  9/10    Osteopenia slightly worse  . Felon i&d  12/2010    Dr. Merlyn Lot, I&D in OR (L index finger)  . Finger surgery    . Knee surgery      right   History  Substance Use Topics  . Smoking status: Former Smoker    Quit date: 07/25/1963  . Smokeless tobacco: Never Used  . Alcohol Use: Yes     Comment: 1/2 glass of wine a day   Family History  Problem Relation Age of Onset  . Multiple myeloma Father   .  Coronary artery disease Father   . Transient ischemic attack Mother   . Lupus Maternal Aunt   . Prostate cancer Maternal Uncle   . Cancer Maternal Grandfather   . Multiple sclerosis Brother   . Breast cancer      Aunt x2   Allergies  Allergen Reactions  . Bactrim [Sulfamethoxazole-Trimethoprim] Rash  . Sulfa Antibiotics Rash   Current Outpatient Prescriptions on File Prior to Visit  Medication Sig Dispense Refill  . Ascorbic Acid (VITAMIN C) 100 MG tablet Take 100 mg by mouth daily.      . Biotin 1 MG CAPS Take 1 tablet by mouth daily.      . calcium-vitamin D (OSCAL) 250-125 MG-UNIT per tablet Take 1 tablet by mouth 2 (two) times daily.       . cholecalciferol (VITAMIN D) 400 UNITS TABS Take by mouth.      . co-enzyme Q-10 30 MG capsule Take 30 mg by mouth 3 (three) times daily.      . fish oil-omega-3 fatty acids 1000 MG capsule Take by mouth 2 (two) times daily.       . FOLBIC 2.5-25-2 MG  TABS TAKE 1 TABLET BY MOUTH DAILY  30 each  5  . ibuprofen (ADVIL,MOTRIN) 200 MG tablet Take 200 mg by mouth as needed for pain.      . Lactobacillus (ACIDOPHILUS) 100 MG CAPS Take 1 capsule by mouth daily.      . magnesium 30 MG tablet Take 30 mg by mouth daily.      . Multiple Vitamin (MULTIVITAMIN) tablet Take 1 tablet by mouth daily.       No current facility-administered medications on file prior to visit.    Review of Systems Review of Systems  Constitutional: Negative for fever, appetite change, fatigue and unexpected weight change.  Eyes: Negative for pain and visual disturbance.  Respiratory: Negative for cough and shortness of breath.   Cardiovascular: Negative for cp or palpitations    Gastrointestinal: Negative for nausea, diarrhea and constipation.  Genitourinary: Negative for urgency and frequency.  Skin: Negative for pallor or rash   MSk neg for change in posture/ back pain or any bone fx Neurological: Negative for weakness, light-headedness, numbness and headaches.  Hematological: Negative for adenopathy. Does not bruise/bleed easily.  Psychiatric/Behavioral: Negative for dysphoric mood. The patient is not nervous/anxious.         Objective:   Physical Exam  Constitutional: She appears well-developed and well-nourished. No distress.  Very petite frame  HENT:  Head: Normocephalic and atraumatic.  Eyes: Conjunctivae and EOM are normal. Pupils are equal, round, and reactive to light.  Neck: Neck supple. No thyromegaly present.  Pulmonary/Chest: Effort normal and breath sounds normal.  Musculoskeletal: She exhibits no edema.  No kyphosis  Neurological: She has normal reflexes.  Skin: No rash noted. No erythema. No pallor.  Psychiatric: She has a normal mood and affect.          Assessment & Plan:

## 2013-05-22 NOTE — Assessment & Plan Note (Signed)
T score -2.6 in spine S/p 5 y of evista and no fx  Disc risk factors Disc need for calcium/ vitamin D/ wt bearing exercise and bone density test every 2 y to monitor Disc safety/ fracture risk in detail   Given px for fosamax to consider starting  Rev side eff in detail dexa 2 y

## 2013-06-11 ENCOUNTER — Ambulatory Visit (AMBULATORY_SURGERY_CENTER): Payer: BC Managed Care – PPO | Admitting: *Deleted

## 2013-06-11 VITALS — Ht 63.0 in | Wt 108.2 lb

## 2013-06-11 DIAGNOSIS — Z1211 Encounter for screening for malignant neoplasm of colon: Secondary | ICD-10-CM

## 2013-06-11 MED ORDER — NA SULFATE-K SULFATE-MG SULF 17.5-3.13-1.6 GM/177ML PO SOLN
1.0000 | Freq: Once | ORAL | Status: DC
Start: 2013-06-11 — End: 2013-07-03

## 2013-06-11 NOTE — Progress Notes (Signed)
No allergies to eggs or soy. No problems with anesthesia.  

## 2013-06-24 ENCOUNTER — Ambulatory Visit: Payer: Medicare Other

## 2013-06-24 ENCOUNTER — Ambulatory Visit
Admission: RE | Admit: 2013-06-24 | Discharge: 2013-06-24 | Disposition: A | Discharge status: Home / Self Care | Payer: Medicare Other | Source: Ambulatory Visit | Attending: Family Medicine | Admitting: Family Medicine

## 2013-06-24 DIAGNOSIS — Z1231 Encounter for screening mammogram for malignant neoplasm of breast: Secondary | ICD-10-CM

## 2013-06-25 DIAGNOSIS — D1801 Hemangioma of skin and subcutaneous tissue: Secondary | ICD-10-CM | POA: Diagnosis not present

## 2013-06-25 DIAGNOSIS — D235 Other benign neoplasm of skin of trunk: Secondary | ICD-10-CM | POA: Diagnosis not present

## 2013-06-25 DIAGNOSIS — L819 Disorder of pigmentation, unspecified: Secondary | ICD-10-CM | POA: Diagnosis not present

## 2013-07-01 ENCOUNTER — Ambulatory Visit: Payer: Medicare Other

## 2013-07-02 ENCOUNTER — Telehealth: Payer: Self-pay | Admitting: *Deleted

## 2013-07-02 NOTE — Telephone Encounter (Signed)
Patient's call was directly sent to admitting and she was concerned about how much liquid she has to drink throughout the day today.  She was wondering if the jello and soups counted as clear liquid.  I reassured her that it did and she is to drink throughout the day to keep herself hydrated.  She understood and all questions were answered.

## 2013-07-03 ENCOUNTER — Ambulatory Visit (AMBULATORY_SURGERY_CENTER): Payer: Medicare Other | Admitting: Gastroenterology

## 2013-07-03 ENCOUNTER — Encounter: Payer: Self-pay | Admitting: Gastroenterology

## 2013-07-03 VITALS — BP 126/49 | HR 55 | Temp 97.9°F | Resp 12 | Ht 63.0 in | Wt 108.0 lb

## 2013-07-03 DIAGNOSIS — Z1211 Encounter for screening for malignant neoplasm of colon: Secondary | ICD-10-CM

## 2013-07-03 DIAGNOSIS — K648 Other hemorrhoids: Secondary | ICD-10-CM

## 2013-07-03 DIAGNOSIS — K649 Unspecified hemorrhoids: Secondary | ICD-10-CM | POA: Diagnosis not present

## 2013-07-03 MED ORDER — SODIUM CHLORIDE 0.9 % IV SOLN: 500.0000 mL | INTRAVENOUS | Status: DC

## 2013-07-03 NOTE — Progress Notes (Signed)
  [facility] Anesthesia Post-op Note  Patient: Veronica Ward  Procedure(s) Performed: colonoscopy  Patient Location: [facility] - Recovery Area  Anesthesia Type: Deep Sedation/Propofol  Level of Consciousness: awake, oriented and patient cooperative  Airway and Oxygen Therapy: Patient Spontanous Breathing  Post-op Pain: none  Post-op Assessment:  Post-op Vital signs reviewed, Patient's Cardiovascular Status Stable, Respiratory Function Stable, Patent Airway, No signs of Nausea or vomiting and Pain level controlled  Post-op Vital Signs: Reviewed and stable  Complications: No apparent anesthesia complications  Oliverio Cho E 10:27 AM

## 2013-07-03 NOTE — Patient Instructions (Signed)
YOU HAD AN ENDOSCOPIC PROCEDURE TODAY AT THE [facility]: Refer to the procedure report that was given to you for any specific questions about what was found during the examination.  If the procedure report does not answer your questions, please call your gastroenterologist to clarify.  If you requested that your care partner not be given the details of your procedure findings, then the procedure report has been included in a sealed envelope for you to review at your convenience later.  YOU SHOULD EXPECT: Some feelings of bloating in the abdomen. Passage of more gas than usual.  Walking can help get rid of the air that was put into your GI tract during the procedure and reduce the bloating. If you had a lower endoscopy (such as a colonoscopy or flexible sigmoidoscopy) you may notice spotting of blood in your stool or on the toilet paper. If you underwent a bowel prep for your procedure, then you may not have a normal bowel movement for a few days.  DIET: Your first meal following the procedure should be a light meal and then it is ok to progress to your normal diet.  A half-sandwich or bowl of soup is an example of a good first meal.  Heavy or fried foods are harder to digest and may make you feel nauseous or bloated.  Likewise meals heavy in dairy and vegetables can cause extra gas to form and this can also increase the bloating.  Drink plenty of fluids but you should avoid alcoholic beverages for 24 hours.  ACTIVITY: Your care partner should take you home directly after the procedure.  You should plan to take it easy, moving slowly for the rest of the day.  You can resume normal activity the day after the procedure however you should NOT DRIVE or use heavy machinery for 24 hours (because of the sedation medicines used during the test).    SYMPTOMS TO REPORT IMMEDIATELY: A gastroenterologist can be reached at any hour.  During normal business hours, 8:30 AM to 5:00 PM Monday through Friday,  call [phone].  After hours and on weekends, please call the GI answering service at [phone]  Emergency number who will take a message and have the physician on call contact you.   Following lower endoscopy (colonoscopy or flexible sigmoidoscopy):  Excessive amounts of blood in the stool  Significant tenderness or worsening of abdominal pains  Swelling of the abdomen that is new, acute  Fever of 100F or higher  FOLLOW UP: If any biopsies were taken you will be contacted by phone or by letter within the next 1-3 weeks.  Call your gastroenterologist if you have not heard about the biopsies in 3 weeks.  Our staff will call the home number listed on your records the next business day following your procedure to check on you and address any questions or concerns that you may have at that time regarding the information given to you following your procedure. This is a courtesy call and so if there is no answer at the home number and we have not heard from you through the emergency physician on call, we will assume that you have returned to your regular daily activities without incident.  SIGNATURES/CONFIDENTIALITY: You and/or your care partner have signed paperwork which will be entered into your electronic medical record.  These signatures attest to the fact that that the information above on your After Visit Summary has been reviewed and is understood.  Full responsibility of the   confidentiality of this discharge information lies with you and/or your care-partner.  Handout on hemorrhoids Repeat colon in 10 years

## 2013-07-03 NOTE — Progress Notes (Signed)
Patient did not experience any of the following events: a burn prior to discharge; a fall within the facility; wrong site/side/patient/procedure/implant event; or a hospital transfer or hospital admission upon discharge from the facility. (G8907)Patient did not have preoperative order for IV antibiotic SSI prophylaxis. (G8918) ewm 

## 2013-07-03 NOTE — Op Note (Signed)
[facility] [address] [place], [address]   COLONOSCOPY PROCEDURE REPORT  PATIENT: Veronica Ward, Veronica Ward  MR#: [id number] BIRTHDATE: February 04, 1946 , 67  yrs. old GENDER: Female ENDOSCOPIST: Louis Meckel, MD REFERRED BY: PROCEDURE DATE:  07/03/2013 PROCEDURE:   Colonoscopy, diagnostic First Screening Colonoscopy - Avg.  risk and is 50 yrs.  old or older - No.  Prior Negative Screening - Now for repeat screening. 10 or more years since last screening  History of Adenoma - Now for follow-up colonoscopy & has been > or = to 3 yrs.  N/A  Polyps Removed Today? No.  Recommend repeat exam, <10 yrs? No. ASA CLASS:   Class II INDICATIONS:Average risk patient for colon cancer. MEDICATIONS: MAC sedation, administered by CRNA and propofol (Diprivan) 150mg  IV  DESCRIPTION OF PROCEDURE:   After the risks benefits and alternatives of the procedure were thoroughly explained, informed consent was obtained.  A digital rectal exam revealed no abnormalities of the rectum.   The [id number]  endoscope was introduced through the anus and advanced to the cecum, which was identified by both the appendix and ileocecal valve. No adverse events experienced.   The quality of the prep was excellent using Suprep  The instrument was then slowly withdrawn as the colon was fully examined.      COLON FINDINGS: Internal hemorrhoids were found.   The colon was otherwise normal.  There was no diverticulosis, inflammation, polyps or cancers unless previously stated.  Retroflexed views revealed no abnormalities. The time to cecum=3 minutes 20 seconds. Withdrawal time=8 minutes 46 seconds.  The scope was withdrawn and the procedure completed. COMPLICATIONS: There were no complications.  ENDOSCOPIC IMPRESSION: 1.   Internal hemorrhoids 2.   The colon was otherwise normal  RECOMMENDATIONS: Given your age, you will not need another colonoscopy for colon cancer screening or polyp  surveillance.  These types of tests usually stop around the age 53.   eSigned:  Louis Meckel, MD 07/03/2013 10:25 AM   cc: Judy Pimple, MD   PATIENT NAME:  [name], [name] MR#: [id number]

## 2013-07-04 ENCOUNTER — Telehealth: Payer: Self-pay | Admitting: *Deleted

## 2013-07-04 NOTE — Telephone Encounter (Signed)
  Follow up Call-  Call back number 07/03/2013  Post procedure Call Back phone  # [phone]  Permission to leave phone message Yes     Patient questions:  Do you have a fever, pain , or abdominal swelling? no Pain Score  0 *  Have you tolerated food without any problems? yes  Have you been able to return to your normal activities? yes  Do you have any questions about your discharge instructions: Diet   no Medications  no Follow up visit  no  Do you have questions or concerns about your Care? no  Actions: * If pain score is 4 or above: No action needed, pain <4.  Pt. Stated I'm great, I am eating breakfast and I'm great.

## 2013-07-25 ENCOUNTER — Ambulatory Visit: Payer: Medicare Other | Admitting: Family Medicine

## 2013-08-07 ENCOUNTER — Encounter: Payer: Self-pay | Admitting: Family Medicine

## 2013-08-07 ENCOUNTER — Ambulatory Visit (INDEPENDENT_AMBULATORY_CARE_PROVIDER_SITE_OTHER)
Admission: RE | Admit: 2013-08-07 | Discharge: 2013-08-07 | Disposition: A | Discharge status: Home / Self Care | Payer: Medicare Other | Source: Ambulatory Visit | Attending: Family Medicine | Admitting: Family Medicine

## 2013-08-07 ENCOUNTER — Ambulatory Visit (INDEPENDENT_AMBULATORY_CARE_PROVIDER_SITE_OTHER): Payer: Medicare Other | Admitting: Family Medicine

## 2013-08-07 VITALS — BP 116/70 | HR 74 | Temp 98.1°F | Ht 63.0 in | Wt 107.2 lb

## 2013-08-07 DIAGNOSIS — M25539 Pain in unspecified wrist: Secondary | ICD-10-CM | POA: Diagnosis not present

## 2013-08-07 DIAGNOSIS — M25532 Pain in left wrist: Secondary | ICD-10-CM

## 2013-08-07 NOTE — Assessment & Plan Note (Signed)
Given osteoporosis and fall on outstretched palma nd pain ttp  In wrist... Will eval with X-ray.  If negative.. Likely tendonitits.. Treat with NSAIDs, ROM exercise and can consider wrist brace prn.

## 2013-08-07 NOTE — Progress Notes (Signed)
Pre-visit discussion using our clinic review tool. No additional management support is needed unless otherwise documented below in the visit note.  

## 2013-08-07 NOTE — Patient Instructions (Signed)
We will call with X-ray results.  

## 2013-08-07 NOTE — Progress Notes (Signed)
   Subjective:    Patient ID: Veronica Ward, female    DOB: 09/20/1945, 68 y.o.   MRN: [id number]  HPI  68 year old female with history of osteoporosis presents with new onset hand pain after a fall 2 weeks ago.  She reports  She tripped and fell on outstretched hands. Immediately after she had pain in B hands L>R.  Swelling and contusion.  Applied ice, took ibuprofen.  She has had resolution of pain and swelling in right hand, but she continues to have pressure/ache in left palmar aspect over thenar eminence. She has noted prominence over radail head.  occ pain radiates up left forearm.     Review of Systems  Constitutional: Negative for fever and fatigue.  HENT: Negative for ear pain.   Eyes: Negative for pain.  Respiratory: Negative for chest tightness and shortness of breath.   Cardiovascular: Negative for chest pain, palpitations and leg swelling.  Gastrointestinal: Negative for abdominal pain.  Genitourinary: Negative for dysuria.       Objective:   Physical Exam  Constitutional: Vital signs are normal. She appears well-developed and well-nourished. She is cooperative.  Non-toxic appearance. She does not appear ill. No distress.  HENT:  Head: Normocephalic.  Right Ear: Hearing, tympanic membrane, external ear and ear canal normal. Tympanic membrane is not erythematous, not retracted and not bulging.  Left Ear: Hearing, tympanic membrane, external ear and ear canal normal. Tympanic membrane is not erythematous, not retracted and not bulging.  Nose: No mucosal edema or rhinorrhea. Right sinus exhibits no maxillary sinus tenderness and no frontal sinus tenderness. Left sinus exhibits no maxillary sinus tenderness and no frontal sinus tenderness.  Mouth/Throat: Uvula is midline, oropharynx is clear and moist and mucous membranes are normal.  Eyes: Conjunctivae, EOM and lids are normal. Pupils are equal, round, and reactive to light. Lids are everted and swept, no foreign  bodies found.  Neck: Trachea normal and normal range of motion. Neck supple. Carotid bruit is not present. No mass and no thyromegaly present.  Cardiovascular: Normal rate, regular rhythm, S1 normal, S2 normal, normal heart sounds, intact distal pulses and normal pulses.  Exam reveals no gallop and no friction rub.   No murmur heard. Pulmonary/Chest: Effort normal and breath sounds normal. Not tachypneic. No respiratory distress. She has no decreased breath sounds. She has no wheezes. She has no rhonchi. She has no rales.  Abdominal: Soft. Normal appearance and bowel sounds are normal. There is no tenderness.  Musculoskeletal:       Left wrist: She exhibits tenderness and bony tenderness. She exhibits normal range of motion, no swelling, no effusion and no crepitus.       Left hand: Normal. She exhibits normal range of motion, no tenderness and no bony tenderness.  ttp over radial head, trapezium and anatomic snuff box on left wrist    Neurological: She is alert.  Skin: Skin is warm, dry and intact. No rash noted.  Psychiatric: Her speech is normal and behavior is normal. Judgment and thought content normal. Her mood appears not anxious. Cognition and memory are normal. She does not exhibit a depressed mood.          Assessment & Plan:

## 2013-09-05 ENCOUNTER — Encounter: Payer: Self-pay | Admitting: Family Medicine

## 2013-09-05 ENCOUNTER — Ambulatory Visit (INDEPENDENT_AMBULATORY_CARE_PROVIDER_SITE_OTHER): Payer: Medicare Other | Admitting: Family Medicine

## 2013-09-05 VITALS — BP 128/74 | HR 77 | Temp 98.9°F | Ht 63.0 in | Wt 105.2 lb

## 2013-09-05 DIAGNOSIS — M545 Low back pain, unspecified: Secondary | ICD-10-CM

## 2013-09-05 DIAGNOSIS — N390 Urinary tract infection, site not specified: Secondary | ICD-10-CM | POA: Insufficient documentation

## 2013-09-05 LAB — POCT URINALYSIS DIPSTICK
Bilirubin, UA: NEGATIVE
Glucose, UA: NEGATIVE
Nitrite, UA: NEGATIVE
Spec Grav, UA: 1.015
Urobilinogen, UA: 0.2
pH, UA: 6

## 2013-09-05 MED ORDER — CIPROFLOXACIN HCL 250 MG PO TABS: 250.0000 mg | ORAL_TABLET | Freq: Two times a day (BID) | ORAL | Status: DC

## 2013-09-05 NOTE — Progress Notes (Signed)
Pre-visit discussion using our clinic review tool. No additional management support is needed unless otherwise documented below in the visit note.  

## 2013-09-05 NOTE — Patient Instructions (Signed)
Drink lots of water  Take the cipro for urinary tract infection  We will send your urine for a culture and update you when that result returns

## 2013-09-05 NOTE — Progress Notes (Signed)
Subjective:    Patient ID: Veronica Ward, female    DOB: [birth year], 68 y.o.   MRN: [id number]  HPI Here with blood spotting- in urine  Yesterday- wiped after urination and noticed some blood  Has a little back ache -not flank   No dyruria or frequency or urgency Nothing different than usual  No nausea or fever   A little mucous/ clot in urine today     Results for orders placed in visit on 09/05/13  POCT URINALYSIS DIPSTICK      Result Value Ref Range   Color, UA yellow     Clarity, UA hazy     Glucose, UA neg.     Bilirubin, UA neg.     Ketones, UA trace     Spec Grav, UA 1.015     Blood, UA Large     pH, UA 6.0     Protein, UA 30+     Urobilinogen, UA 0.2     Nitrite, UA neg.     Leukocytes, UA moderate (2+)       Patient Active Problem List   Diagnosis Date Noted  . Left wrist pain 08/07/2013  . Travel advice encounter 04/11/2013  . Viral URI 04/11/2013  . Encounter for Medicare annual wellness exam 03/12/2013  . Colon cancer screening 03/12/2013  . Routine general medical examination at a health care facility 03/03/2013  . Foot pain 02/02/2012  . Left foot pain 01/17/2012  . Right shoulder pain 01/02/2012  . Right arm pain 01/02/2012  . Low back pain 11/10/2011  . Other screening mammogram 04/05/2011  . Encounter for routine gynecological examination 04/05/2011  . History of cervical cancer 04/05/2011  . Screening for lipoid disorders 01/15/2011  . Diabetes mellitus screening 01/15/2011  . Encounter for medication monitoring 11/23/2010  . Weight loss 11/23/2010  . MALAISE AND FATIGUE 02/17/2010  . HEADACHE 02/17/2010  . TOE PAIN 12/29/2009  . SEBORRHEIC KERATOSIS 08/25/2009  . STRESS REACTION, ACUTE, WITH EMOTIONAL DISTURBANCE 12/09/2008  . SLEEP DISORDER 12/09/2008  . HEMORRHOIDS 10/12/2008  . Osteoporosis 10/08/2007  . BASAL CELL CARCINOMA, FACE 06/21/2007  . FIBROCYSTIC BREAST DISEASE 06/21/2007  . PSORIASIS 06/21/2007  . URTICARIA  06/21/2007  . BASAL CELL CARCINOMA, FACE 06/21/2007   Past Medical History  Diagnosis Date  . Hx of basal cell carcinoma     face  . Diffuse cystic mastopathy   . Headache(784.0)   . Unspecified hemorrhoids without mention of complication   . Other malaise and fatigue   . Disorder of bone and cartilage, unspecified   . Other psoriasis   . Other seborrheic keratosis   . Sleep disturbance, unspecified   . Predominant disturbance of emotions   . Pain in limb   . Urticaria, unspecified   . History of shingles   . Anxiety   . Anemia   . Dysautonomia     mild  . Osteoporosis    Past Surgical History  Procedure Laterality Date  . Cervical conization w/bx    . Cervical cancer  1973  . Breast biopsy  2001    benign  . Tubal ligation    . Dexa  03/2002    oseopenia  . Colonoscopy  12/04    int. hemorrhoids  . Dexa  07/2004    decreased BMD, osteopenia  . Dexa  02/2007    Osteopenia  . Breast lumpectomy      left underarm  . Birthmark removal  childhood  . Dexa  9/10    Osteopenia slightly worse  . Felon i&d  12/2010    Dr. Fredna Dow, I&D in OR (L index finger)  . Finger surgery    . Knee surgery      right   History  Substance Use Topics  . Smoking status: Former Smoker    Quit date: 07/25/1963  . Smokeless tobacco: Never Used  . Alcohol Use: Yes     Comment: 1/2 glass of wine a day   Family History  Problem Relation Age of Onset  . Multiple myeloma Father   . Coronary artery disease Father   . Transient ischemic attack Mother   . Lupus Maternal Aunt   . Prostate cancer Maternal Uncle   . Cancer Maternal Grandfather   . Multiple sclerosis Brother   . Breast cancer      Aunt x2  . Colon cancer Neg Hx    Allergies  Allergen Reactions  . Bactrim [Sulfamethoxazole-Trimethoprim] Rash  . Benadryl [Diphenhydramine Hcl (Sleep)] Rash    Fever, body ache  . Sulfa Antibiotics Rash   Current Outpatient Prescriptions on File Prior to Visit  Medication Sig Dispense  Refill  . alendronate (FOSAMAX) 70 MG tablet Take 1 tablet (70 mg total) by mouth every 7 (seven) days. Take with a full glass of water on an empty stomach.  4 tablet  11  . Ascorbic Acid (VITAMIN C) 100 MG tablet Take 100 mg by mouth daily.      . Biotin 1 MG CAPS Take 1 tablet by mouth daily.      . calcium-vitamin D (OSCAL) 250-125 MG-UNIT per tablet Take 1 tablet by mouth 2 (two) times daily.       . cholecalciferol (VITAMIN D) 400 UNITS TABS Take by mouth.      . co-enzyme Q-10 30 MG capsule Take 30 mg by mouth 3 (three) times daily.      . fish oil-omega-3 fatty acids 1000 MG capsule Take by mouth 2 (two) times daily.       . FOLBIC 2.5-25-2 MG TABS TAKE 1 TABLET BY MOUTH DAILY  30 each  5  . ibuprofen (ADVIL,MOTRIN) 200 MG tablet Take 200 mg by mouth as needed for pain.      . Lactobacillus (ACIDOPHILUS) 100 MG CAPS Take 1 capsule by mouth daily.      . magnesium 30 MG tablet Take 30 mg by mouth daily.      . Multiple Vitamin (MULTIVITAMIN) tablet Take 1 tablet by mouth daily.       No current facility-administered medications on file prior to visit.    Review of Systems Review of Systems  Constitutional: Negative for fever, appetite change, fatigue and unexpected weight change.  Eyes: Negative for pain and visual disturbance.  Respiratory: Negative for cough and shortness of breath.   Cardiovascular: Negative for cp or palpitations    Gastrointestinal: Negative for nausea, diarrhea and constipation.  Genitourinary: Negative for urgency and frequency. pos for mild hematuria/ neg for flank pain   Skin: Negative for pallor or rash   Neurological: Negative for weakness, light-headedness, numbness and headaches.  Hematological: Negative for adenopathy. Does not bruise/bleed easily.  Psychiatric/Behavioral: Negative for dysphoric mood. The patient is not nervous/anxious.         Objective:   Physical Exam  Constitutional: She appears well-developed and well-nourished. No distress.    HENT:  Head: Normocephalic and atraumatic.  Neck: Normal range of motion. Neck supple.  Cardiovascular: Normal rate and regular rhythm.  Pulmonary/Chest: Effort normal and breath sounds normal.  Abdominal: Soft. Bowel sounds are normal. She exhibits no distension and no mass. There is no tenderness.  No suprapubic tenderness or fullness   No cva tenderness   Neurological: She is alert.  Skin: Skin is warm and dry. No rash noted.  Psychiatric: She has a normal mood and affect.          Assessment & Plan:

## 2013-09-06 ENCOUNTER — Encounter: Payer: Self-pay | Admitting: Family Medicine

## 2013-09-06 LAB — URINE CULTURE
Colony Count: NO GROWTH
Organism ID, Bacteria: NO GROWTH

## 2013-09-07 LAB — POCT UA - MICROSCOPIC ONLY
Casts, Ur, LPF, POC: 0
Yeast, UA: 0

## 2013-09-08 ENCOUNTER — Telehealth: Payer: Self-pay | Admitting: Family Medicine

## 2013-09-08 DIAGNOSIS — N950 Postmenopausal bleeding: Secondary | ICD-10-CM | POA: Insufficient documentation

## 2013-09-08 NOTE — Telephone Encounter (Signed)
Re to gyn for post men bleeding

## 2013-09-12 ENCOUNTER — Telehealth: Payer: Self-pay | Admitting: *Deleted

## 2013-09-12 NOTE — Telephone Encounter (Signed)
The next step will be gyn eval - I do not know if she has the appt yet - I did the referral - (let her know our office is behind on referrals due to weather etc)- and I can exp it to urgent if needed  Please direct this to Rosaria Ferries if needed-thanks

## 2013-09-12 NOTE — Telephone Encounter (Signed)
Pt viewed her mychart results for her urine culture. Pt said her sxs have not resolved and are getting worse. Pt is still having vaginal bleeding (spotting), and bad back pain. Pt did finish the cipro and she wants to know what should she do? please advise

## 2013-09-12 NOTE — Telephone Encounter (Signed)
Pt does have an appt with GYN on Tuesday so she said she will keep that appt.

## 2013-09-16 ENCOUNTER — Encounter: Payer: Medicare Other | Admitting: Obstetrics & Gynecology

## 2013-09-17 ENCOUNTER — Ambulatory Visit (INDEPENDENT_AMBULATORY_CARE_PROVIDER_SITE_OTHER): Payer: Medicare Other | Admitting: Obstetrics and Gynecology

## 2013-09-17 ENCOUNTER — Encounter: Payer: Self-pay | Admitting: Obstetrics and Gynecology

## 2013-09-17 VITALS — BP 124/68 | HR 70 | Ht 63.0 in | Wt 106.5 lb

## 2013-09-17 DIAGNOSIS — C549 Malignant neoplasm of corpus uteri, unspecified: Secondary | ICD-10-CM | POA: Diagnosis not present

## 2013-09-17 DIAGNOSIS — N950 Postmenopausal bleeding: Secondary | ICD-10-CM

## 2013-09-17 NOTE — Progress Notes (Signed)
Had episode two weeks ago with back pain and blood in urine, see by Dr. Glori Bickers.  Urine did not grow anything.  Since then she has had episode of vaginal spotting, back pain did resolve.  During episode of vaginal spotting "it felt like menstrual cramps".

## 2013-09-17 NOTE — Progress Notes (Signed)
Patient ID: Veronica Ward, female   DOB: 12/09/1945, 68 y.o.   MRN: [id number] 68 yo postmenopausal for over 10 years, here for evaluation of vaginal spotting for the past 2 weeks. Patient reports noticing blood upon wiping and was having some lower back pain at the same time. She thought she had a UTI and was seen by PCP. Subsequently patient noted some vaginal spotting accompanied by cramping pain for 7 days which spontaneously resolved a few days ago. Patient is sexually active with husband and states that they had a very energetic session prior to the bleeding starting. She denies any pain.  Past Medical History  Diagnosis Date  . Hx of basal cell carcinoma     face  . Diffuse cystic mastopathy   . Headache(784.0)   . Unspecified hemorrhoids without mention of complication   . Other malaise and fatigue   . Disorder of bone and cartilage, unspecified   . Other psoriasis   . Other seborrheic keratosis   . Sleep disturbance, unspecified   . Predominant disturbance of emotions   . Pain in limb   . Urticaria, unspecified   . History of shingles   . Anxiety   . Anemia   . Dysautonomia     mild  . Osteoporosis    Past Surgical History  Procedure Laterality Date  . Cervical conization w/bx    . Cervical cancer  1973  . Breast biopsy  2001    benign  . Tubal ligation    . Dexa  03/2002    oseopenia  . Colonoscopy  12/04    int. hemorrhoids  . Dexa  07/2004    decreased BMD, osteopenia  . Dexa  02/2007    Osteopenia  . Breast lumpectomy      left underarm  . Birthmark removal  childhood  . Dexa  9/10    Osteopenia slightly worse  . Felon i&d  12/2010    Dr. Fredna Dow, I&D in OR (L index finger)  . Finger surgery    . Knee surgery      right   Family History  Problem Relation Age of Onset  . Multiple myeloma Father   . Coronary artery disease Father   . Transient ischemic attack Mother   . Lupus Maternal Aunt   . Prostate cancer Maternal Uncle   . Cancer Maternal Grandfather    . Multiple sclerosis Brother   . Breast cancer      Aunt x2  . Colon cancer Neg Hx    History  Substance Use Topics  . Smoking status: Former Smoker    Quit date: 07/25/1963  . Smokeless tobacco: Never Used  . Alcohol Use: Yes     Comment: 1/2 glass of wine a day   GENERAL: Well-developed, well-nourished female in no acute distress.  ABDOMEN: Soft, nontender, nondistended. No organomegaly. PELVIC: Normal external female genitalia. Vagina is atrophic.  Normal discharge. Normal appearing cervix. Uterus is normal in size. No adnexal mass or tenderness. EXTREMITIES: No cyanosis, clubbing, or edema, 2+ distal pulses.   A/P 68 yo with postmenopausal vaginal bleeding - Patient counseled on need for endometrial biopsy ENDOMETRIAL BIOPSY     The indications for endometrial biopsy were reviewed.   Risks of the biopsy including cramping, bleeding, infection, uterine perforation, inadequate specimen and need for additional procedures  were discussed. The patient states she understands and agrees to undergo procedure today. Consent was signed. Time out was performed. Urine HCG was negative. A sterile  speculum was placed in the patient's vagina and the cervix was prepped with Betadine. A single-toothed tenaculum was placed on the anterior lip of the cervix to stabilize it. The uterine cavity was sounded to a depth of 8 cm using the uterine sound. The 3 mm pipelle was introduced into the endometrial cavity without difficulty, 2 passes were made.  A  moderate amount of tissue was  sent to pathology. The instruments were removed from the patient's vagina. Minimal bleeding from the cervix was noted. The patient tolerated the procedure well.  Routine post-procedure instructions were given to the patient. The patient will follow up in two weeks to review the results and for further management.   - Pelvic ultrasound also ordered - patient will be contacted with any abnormal results and further management

## 2013-09-22 ENCOUNTER — Encounter: Payer: Self-pay | Admitting: Family Medicine

## 2013-09-23 ENCOUNTER — Encounter: Payer: Self-pay | Admitting: Gynecology

## 2013-09-23 ENCOUNTER — Ambulatory Visit: Payer: Medicare Other | Attending: Gynecology | Admitting: Gynecology

## 2013-09-23 ENCOUNTER — Ambulatory Visit (HOSPITAL_COMMUNITY)
Admission: RE | Admit: 2013-09-23 | Discharge: 2013-09-23 | Disposition: A | Discharge status: Home / Self Care | Payer: Medicare Other | Source: Ambulatory Visit | Attending: Obstetrics and Gynecology | Admitting: Obstetrics and Gynecology

## 2013-09-23 VITALS — BP 134/62 | HR 85 | Temp 99.5°F | Ht 63.0 in | Wt 105.4 lb

## 2013-09-23 DIAGNOSIS — F411 Generalized anxiety disorder: Secondary | ICD-10-CM | POA: Diagnosis not present

## 2013-09-23 DIAGNOSIS — L408 Other psoriasis: Secondary | ICD-10-CM | POA: Insufficient documentation

## 2013-09-23 DIAGNOSIS — N949 Unspecified condition associated with female genital organs and menstrual cycle: Secondary | ICD-10-CM | POA: Diagnosis not present

## 2013-09-23 DIAGNOSIS — C541 Malignant neoplasm of endometrium: Secondary | ICD-10-CM

## 2013-09-23 DIAGNOSIS — Z8249 Family history of ischemic heart disease and other diseases of the circulatory system: Secondary | ICD-10-CM | POA: Insufficient documentation

## 2013-09-23 DIAGNOSIS — N938 Other specified abnormal uterine and vaginal bleeding: Secondary | ICD-10-CM | POA: Diagnosis not present

## 2013-09-23 DIAGNOSIS — Z87891 Personal history of nicotine dependence: Secondary | ICD-10-CM | POA: Diagnosis not present

## 2013-09-23 DIAGNOSIS — C549 Malignant neoplasm of corpus uteri, unspecified: Secondary | ICD-10-CM | POA: Insufficient documentation

## 2013-09-23 DIAGNOSIS — D069 Carcinoma in situ of cervix, unspecified: Secondary | ICD-10-CM | POA: Insufficient documentation

## 2013-09-23 DIAGNOSIS — N925 Other specified irregular menstruation: Secondary | ICD-10-CM | POA: Diagnosis not present

## 2013-09-23 DIAGNOSIS — N950 Postmenopausal bleeding: Secondary | ICD-10-CM | POA: Insufficient documentation

## 2013-09-23 DIAGNOSIS — Z823 Family history of stroke: Secondary | ICD-10-CM | POA: Insufficient documentation

## 2013-09-23 DIAGNOSIS — Z79899 Other long term (current) drug therapy: Secondary | ICD-10-CM | POA: Insufficient documentation

## 2013-09-23 DIAGNOSIS — Z85828 Personal history of other malignant neoplasm of skin: Secondary | ICD-10-CM | POA: Diagnosis not present

## 2013-09-23 DIAGNOSIS — Z803 Family history of malignant neoplasm of breast: Secondary | ICD-10-CM | POA: Insufficient documentation

## 2013-09-23 NOTE — Patient Instructions (Addendum)
Plan for surgery (robotic assisted hysterectomy, bilateral salpingo-oophorectomy, with possible lymph node dissection) with Dr. Cindie Laroche on March 13 at [facility].  Pre-operative appt at [facility] on March 5 at 11:00am at [facility] in the [facility].

## 2013-09-23 NOTE — Progress Notes (Signed)
Consult Note: Gyn-Onc   Veronica Ward 68 y.o. female  No chief complaint on file.   Assessment : Endometrial carcinoma at least grade 2 on endometrial biopsy.  Plan: I recommend the patient undergo a robotic hysterectomy, bilateral salpingo-oophorectomy and pelvic and periaortic lymphadenectomy. The patient like to have surgery soon as possible and therefore it is scheduled to be performed at [facility] on March 13. Dr. Cindie Laroche will be the attending surgeon.    HPI: 68 year old white female seen in consultation request of Dr. Mora Bellman regarding management of a newly diagnosed endometrial cancer. Approximately 3 weeks ago the patient developed some vaginal spotting. She was seen by Dr. Elly Modena on February 25. An endometrial biopsy was obtained showing a grade 2 endometrial carcinoma. The patient is also had an ultrasound of the pelvis revealing a uterus measuring 7 x 3 x 3.7 cm. The endometrial stripe was 7 mm.  [name] She has no other gynecologic history.  Review of Systems:10 point review of systems is negative except as noted in interval history.   Vitals: Blood pressure 134/62, pulse 85, temperature 99.5 F (37.5 C), temperature source Oral, height _0  (1.6 m), weight 105 lb 6.4 oz (47.809 kg), last menstrual period 09/17/2000.  Physical Exam: General : The patient is a healthy woman in no acute distress.  HEENT: normocephalic, extraoccular movements normal; neck is supple without thyromegally  Lynphnodes: Supraclavicular and inguinal nodes not enlarged  Abdomen: Soft, non-tender, no ascites, no organomegally, no masses, no hernias  Pelvic:  EGBUS: Normal female  Vagina: Normal, no lesions, atrophic  Urethra and Bladder: Normal, non-tender  Cervix: Normal Uterus: Anterior, normal shape size and  consistency. Bi-manual examination: Non-tender; no adenxal masses or nodularity  Rectal: normal sphincter tone, no masses, no blood  Lower extremities: No edema or varicosities. Normal range of motion      Allergies  Allergen Reactions  . Bactrim [Sulfamethoxazole-Trimethoprim] Rash  . Benadryl [Diphenhydramine Hcl (Sleep)] Rash    Fever, body ache  . Sulfa Antibiotics Rash    Past Medical History  Diagnosis Date  . Hx of basal cell carcinoma     face  . Diffuse cystic mastopathy   . Headache(784.0)   . Unspecified hemorrhoids without mention of complication   . Other malaise and fatigue   . Disorder of bone and cartilage, unspecified   . Other psoriasis   . Other seborrheic keratosis   . Sleep disturbance, unspecified   . Predominant disturbance of emotions   . Pain in limb   . Urticaria, unspecified   . History of shingles   . Anxiety   . Anemia   . Dysautonomia     mild  . Osteoporosis     Past Surgical History  Procedure Laterality Date  . Cervical conization w/bx    . Cervical cancer  1973  . Breast biopsy  2001    benign  . Tubal ligation    . Dexa  03/2002    oseopenia  . Colonoscopy  12/04    int. hemorrhoids  . Dexa  07/2004    decreased BMD, osteopenia  . Dexa  02/2007    Osteopenia  . Breast lumpectomy      left underarm  . Birthmark removal  childhood  . Dexa  9/10    Osteopenia slightly worse  .  Felon i&d  12/2010    Dr. Fredna Dow, I&D in OR (Ward index finger)  . Finger surgery    . Knee surgery      right    Current Outpatient Prescriptions  Medication Sig Dispense Refill  . alendronate (FOSAMAX) 70 MG tablet Take 1 tablet (70 mg total) by mouth every 7 (seven) days. Take with a full glass of water on an empty stomach.  4 tablet  11  . Ascorbic Acid (VITAMIN C) 100 MG tablet Take 100 mg by mouth daily.      . Biotin 1 MG CAPS Take 1 tablet by mouth daily.      . calcium-vitamin D (OSCAL) 250-125 MG-UNIT per tablet Take 1 tablet by mouth 2  (two) times daily.       . cholecalciferol (VITAMIN D) 400 UNITS TABS Take by mouth.      . co-enzyme Q-10 30 MG capsule Take 30 mg by mouth 3 (three) times daily.      . fish oil-omega-3 fatty acids 1000 MG capsule Take by mouth 2 (two) times daily.       . FOLBIC 2.5-25-2 MG TABS TAKE 1 TABLET BY MOUTH DAILY  30 each  5  . ibuprofen (ADVIL,MOTRIN) 200 MG tablet Take 200 mg by mouth as needed for pain.      . Lactobacillus (ACIDOPHILUS) 100 MG CAPS Take 1 capsule by mouth daily.      . magnesium 30 MG tablet Take 30 mg by mouth daily.      . Multiple Vitamin (MULTIVITAMIN) tablet Take 1 tablet by mouth daily.       No current facility-administered medications for this visit.    History   Social History  . Marital Status: Married    Spouse Name: N/A    Number of Children: 3  . Years of Education: N/A   Occupational History  . Professor    Social History Main Topics  . Smoking status: Former Smoker    Quit date: 07/25/1963  . Smokeless tobacco: Never Used  . Alcohol Use: Yes     Comment: 1/2 glass of wine a day  . Drug Use: No  . Sexual Activity: Yes    Birth Control/ Protection: Post-menopausal   Other Topics Concern  . Not on file   Social History Narrative   History professor      Likes to dance      Is a walker and does floor exercises      Caffeine: 2 cups daily    Family History  Problem Relation Age of Onset  . Multiple myeloma Father   . Coronary artery disease Father   . Transient ischemic attack Mother   . Lupus Maternal Aunt   . Prostate cancer Maternal Uncle   . Cancer Maternal Grandfather   . Multiple sclerosis Brother   . Breast cancer      Aunt x2  . Colon cancer Neg Hx       CLARKE-PEARSON,Veronica Smoak L, MD 09/23/2013, 4:04 PM

## 2013-09-25 DIAGNOSIS — Z01818 Encounter for other preprocedural examination: Secondary | ICD-10-CM | POA: Diagnosis not present

## 2013-09-25 DIAGNOSIS — Z8542 Personal history of malignant neoplasm of other parts of uterus: Secondary | ICD-10-CM | POA: Diagnosis not present

## 2013-09-25 DIAGNOSIS — Z780 Asymptomatic menopausal state: Secondary | ICD-10-CM | POA: Diagnosis not present

## 2013-09-25 DIAGNOSIS — C549 Malignant neoplasm of corpus uteri, unspecified: Secondary | ICD-10-CM | POA: Diagnosis not present

## 2013-09-25 DIAGNOSIS — M810 Age-related osteoporosis without current pathological fracture: Secondary | ICD-10-CM | POA: Diagnosis not present

## 2013-09-25 DIAGNOSIS — Z85828 Personal history of other malignant neoplasm of skin: Secondary | ICD-10-CM | POA: Diagnosis not present

## 2013-09-25 DIAGNOSIS — Z87891 Personal history of nicotine dependence: Secondary | ICD-10-CM | POA: Diagnosis not present

## 2013-09-25 DIAGNOSIS — Z9079 Acquired absence of other genital organ(s): Secondary | ICD-10-CM | POA: Diagnosis not present

## 2013-09-29 ENCOUNTER — Ambulatory Visit (HOSPITAL_COMMUNITY): Payer: Medicare Other

## 2013-10-02 ENCOUNTER — Ambulatory Visit: Payer: Medicare Other | Admitting: Obstetrics and Gynecology

## 2013-10-03 DIAGNOSIS — Z85828 Personal history of other malignant neoplasm of skin: Secondary | ICD-10-CM | POA: Diagnosis not present

## 2013-10-03 DIAGNOSIS — N83339 Acquired atrophy of ovary and fallopian tube, unspecified side: Secondary | ICD-10-CM | POA: Diagnosis not present

## 2013-10-03 DIAGNOSIS — Z87891 Personal history of nicotine dependence: Secondary | ICD-10-CM | POA: Diagnosis not present

## 2013-10-03 DIAGNOSIS — D649 Anemia, unspecified: Secondary | ICD-10-CM | POA: Diagnosis not present

## 2013-10-03 DIAGNOSIS — Z882 Allergy status to sulfonamides status: Secondary | ICD-10-CM | POA: Diagnosis not present

## 2013-10-03 DIAGNOSIS — C549 Malignant neoplasm of corpus uteri, unspecified: Secondary | ICD-10-CM | POA: Diagnosis not present

## 2013-10-03 DIAGNOSIS — F411 Generalized anxiety disorder: Secondary | ICD-10-CM | POA: Diagnosis not present

## 2013-10-03 DIAGNOSIS — N888 Other specified noninflammatory disorders of cervix uteri: Secondary | ICD-10-CM | POA: Diagnosis not present

## 2013-10-03 DIAGNOSIS — M810 Age-related osteoporosis without current pathological fracture: Secondary | ICD-10-CM | POA: Diagnosis not present

## 2013-10-03 DIAGNOSIS — N9489 Other specified conditions associated with female genital organs and menstrual cycle: Secondary | ICD-10-CM | POA: Diagnosis not present

## 2013-10-03 DIAGNOSIS — Z888 Allergy status to other drugs, medicaments and biological substances status: Secondary | ICD-10-CM | POA: Diagnosis not present

## 2013-10-03 DIAGNOSIS — Z79899 Other long term (current) drug therapy: Secondary | ICD-10-CM | POA: Diagnosis not present

## 2013-10-03 DIAGNOSIS — Z9851 Tubal ligation status: Secondary | ICD-10-CM | POA: Diagnosis not present

## 2013-10-03 DIAGNOSIS — Z8541 Personal history of malignant neoplasm of cervix uteri: Secondary | ICD-10-CM | POA: Diagnosis not present

## 2013-10-03 DIAGNOSIS — D259 Leiomyoma of uterus, unspecified: Secondary | ICD-10-CM | POA: Diagnosis not present

## 2013-10-03 HISTORY — PX: ROBOTIC ASSISTED TOTAL HYSTERECTOMY WITH BILATERAL SALPINGO OOPHERECTOMY: SHX6086

## 2013-10-04 DIAGNOSIS — C549 Malignant neoplasm of corpus uteri, unspecified: Secondary | ICD-10-CM | POA: Diagnosis not present

## 2013-10-04 DIAGNOSIS — N83339 Acquired atrophy of ovary and fallopian tube, unspecified side: Secondary | ICD-10-CM | POA: Diagnosis not present

## 2013-10-04 DIAGNOSIS — M810 Age-related osteoporosis without current pathological fracture: Secondary | ICD-10-CM | POA: Diagnosis not present

## 2013-10-04 DIAGNOSIS — N9489 Other specified conditions associated with female genital organs and menstrual cycle: Secondary | ICD-10-CM | POA: Diagnosis not present

## 2013-10-04 DIAGNOSIS — Z87891 Personal history of nicotine dependence: Secondary | ICD-10-CM | POA: Diagnosis not present

## 2013-10-04 DIAGNOSIS — Z85828 Personal history of other malignant neoplasm of skin: Secondary | ICD-10-CM | POA: Diagnosis not present

## 2013-10-10 ENCOUNTER — Other Ambulatory Visit: Payer: Self-pay | Admitting: Gynecologic Oncology

## 2013-10-10 ENCOUNTER — Telehealth: Payer: Self-pay | Admitting: Oncology

## 2013-10-10 DIAGNOSIS — C541 Malignant neoplasm of endometrium: Secondary | ICD-10-CM

## 2013-10-10 NOTE — Telephone Encounter (Signed)
S/W PATIENT AND GAVE NEW PATIENT APPT FOR 03/30 @ 3 W/DR. LIVESAY; CHEMO EDU 03/26 @ 12:30.

## 2013-10-10 NOTE — Telephone Encounter (Signed)
C/D 10/10/13 for appt. 10/20/13

## 2013-10-10 NOTE — Progress Notes (Signed)
Received phone call this am from [facility].  Patient to be referred for chemotherapy, Carbo and Taxol, followed by vaginal brachytherapy per Dr. Clarene Essex at [facility].  Referrals made per [facility].  [facility] records given to Dr. Marko Plume.  Appt needs to be scheduled after April 6 (post-op check at [facility]) per Dr. Clarene Essex.

## 2013-10-16 ENCOUNTER — Telehealth: Payer: Self-pay | Admitting: Oncology

## 2013-10-16 ENCOUNTER — Other Ambulatory Visit: Payer: Medicare Other

## 2013-10-16 NOTE — Telephone Encounter (Signed)
S/W PATIENT AND GAVE NEW DATE/TIME FOR 04/13 @ 3 W/DR. LIVESAY.  CALENDAR MAILED.

## 2013-10-17 ENCOUNTER — Encounter: Payer: Self-pay | Admitting: Oncology

## 2013-10-17 ENCOUNTER — Other Ambulatory Visit: Payer: Medicare Other

## 2013-10-17 NOTE — Progress Notes (Signed)
No date in system for treatments as of today.

## 2013-10-20 ENCOUNTER — Other Ambulatory Visit: Payer: Medicare Other

## 2013-10-20 ENCOUNTER — Ambulatory Visit: Payer: Medicare Other | Admitting: Radiation Oncology

## 2013-10-20 ENCOUNTER — Ambulatory Visit: Payer: Medicare Other

## 2013-10-20 ENCOUNTER — Ambulatory Visit: Payer: Medicare Other | Admitting: Oncology

## 2013-10-22 DIAGNOSIS — C55 Malignant neoplasm of uterus, part unspecified: Secondary | ICD-10-CM | POA: Diagnosis not present

## 2013-10-27 ENCOUNTER — Other Ambulatory Visit: Payer: Self-pay | Admitting: Family Medicine

## 2013-10-27 DIAGNOSIS — D649 Anemia, unspecified: Secondary | ICD-10-CM | POA: Diagnosis not present

## 2013-10-27 DIAGNOSIS — C55 Malignant neoplasm of uterus, part unspecified: Secondary | ICD-10-CM | POA: Diagnosis not present

## 2013-10-27 DIAGNOSIS — C549 Malignant neoplasm of corpus uteri, unspecified: Secondary | ICD-10-CM | POA: Diagnosis not present

## 2013-10-27 DIAGNOSIS — Z87891 Personal history of nicotine dependence: Secondary | ICD-10-CM | POA: Diagnosis not present

## 2013-10-27 DIAGNOSIS — Z8041 Family history of malignant neoplasm of ovary: Secondary | ICD-10-CM | POA: Diagnosis not present

## 2013-10-27 DIAGNOSIS — M810 Age-related osteoporosis without current pathological fracture: Secondary | ICD-10-CM | POA: Diagnosis not present

## 2013-10-27 DIAGNOSIS — Z803 Family history of malignant neoplasm of breast: Secondary | ICD-10-CM | POA: Diagnosis not present

## 2013-10-28 ENCOUNTER — Ambulatory Visit: Payer: Medicare Other | Admitting: Nutrition

## 2013-10-28 DIAGNOSIS — Z8542 Personal history of malignant neoplasm of other parts of uterus: Secondary | ICD-10-CM | POA: Insufficient documentation

## 2013-10-28 NOTE — Progress Notes (Signed)
68 year old female diagnosed with uterine cancer.  She is a patient of Dr. Fermin Schwab and Dr. Marko Plume.  Past medical history includes malaise, fatigue, shingles, anxiety, anemia, and osteoporosis.  Medications include Fosamax, vitamin C, biotin, Os-Cal, vitamin D, omega-3 fatty acids, acidophilus, multivitamin, and magnesium.  Height: 63 inches. Weight: 105 pounds. BMI: 18.68.  Patient presents to nutrition consult with her husband.  Patient has not begun treatment.  She is seeking information on healthy diet during treatment.  Patient reports she eats a generally healthy, plant-based diet.  She does not eat many processed foods.  She is well educated on the benefits of a plant-based diet.  Patient is slightly underweight with BMI of 18.68.  Nutrition diagnosis:  Food and nutrition related knowledge deficit related to new diagnosis of uterine cancer and associated treatments as evidenced by no prior need for nutrition related information.  Intervention: Patient and husband were educated on the importance of continuing a healthy, plant-based diet with increased whole fruits and vegetables and whole grains.  I reviewed the benefits of organic foods with patient.  We discussed the importance of lean sources of protein.  Patient was encouraged to strive for weight maintenance throughout treatment.  Multiple questions were answered regarding healthy diet.  Fact sheets were provided.  Questions were answered.  Teach back method used.  Monitoring, evaluation, goals: Patient will tolerate a healthy-based diet with minimal weight loss throughout treatment.  Next visit: I will followup with patient during chemotherapy.

## 2013-10-30 DIAGNOSIS — C55 Malignant neoplasm of uterus, part unspecified: Secondary | ICD-10-CM | POA: Diagnosis not present

## 2013-10-31 ENCOUNTER — Encounter: Payer: Self-pay | Admitting: Radiation Oncology

## 2013-10-31 NOTE — Progress Notes (Addendum)
GYN Location of Tumor / Histology: Endometrial carcinoma   Patient presented in February 2015 with some vaginal spotting . Originally though it was a UTI.  Biopsies of revealed:   09/17/13 Diagnosis Endometrium, biopsy, uterus, lining - POSITIVE FOR ENDOMETRIAL ADENOCARCINOMA.  Past/Anticipated interventions by Gyn/Onc surgery, if any: Robotic-assisted total laparoscopic hysterectomy, bilateral salpingo-oophorectomy, bilateral pelvic and para-aortic lymphadenectomy with sentinel lymph node dissection on 10/03/2013 by Dr. Clarene Essex.  Past/Anticipated interventions by medical oncology, if any: apt with Dr. Marko Plume 11/03/13  Weight changes, if any: no  Bowel/Bladder complaints, if any: reports urinate - gets up every hour at night- is drinking more water  Nausea/Vomiting, if any: no  Pain issues, if any:  Occasional aching in lower abdomen from surgery.  SAFETY ISSUES:  Prior radiation? no  Pacemaker/ICD? no  Possible current pregnancy? no  Is the patient on methotrexate? no  Current Complaints / other details:  Here with her husband.  Denies pain.  Did have a headache this morning.  Patient starting chemotherapy tomorrow.

## 2013-11-02 ENCOUNTER — Other Ambulatory Visit: Payer: Self-pay | Admitting: Oncology

## 2013-11-02 DIAGNOSIS — C541 Malignant neoplasm of endometrium: Secondary | ICD-10-CM

## 2013-11-03 ENCOUNTER — Ambulatory Visit (HOSPITAL_BASED_OUTPATIENT_CLINIC_OR_DEPARTMENT_OTHER): Payer: Medicare Other | Admitting: Lab

## 2013-11-03 ENCOUNTER — Encounter: Payer: Self-pay | Admitting: *Deleted

## 2013-11-03 ENCOUNTER — Encounter: Payer: Self-pay | Admitting: Oncology

## 2013-11-03 ENCOUNTER — Ambulatory Visit (HOSPITAL_BASED_OUTPATIENT_CLINIC_OR_DEPARTMENT_OTHER): Payer: Medicare Other

## 2013-11-03 ENCOUNTER — Ambulatory Visit (HOSPITAL_BASED_OUTPATIENT_CLINIC_OR_DEPARTMENT_OTHER): Payer: Medicare Other | Admitting: Oncology

## 2013-11-03 VITALS — BP 152/80 | HR 75 | Temp 98.8°F | Resp 18 | Ht 63.0 in | Wt 103.7 lb

## 2013-11-03 DIAGNOSIS — Z8041 Family history of malignant neoplasm of ovary: Secondary | ICD-10-CM | POA: Diagnosis not present

## 2013-11-03 DIAGNOSIS — C541 Malignant neoplasm of endometrium: Secondary | ICD-10-CM | POA: Insufficient documentation

## 2013-11-03 DIAGNOSIS — M810 Age-related osteoporosis without current pathological fracture: Secondary | ICD-10-CM | POA: Diagnosis not present

## 2013-11-03 DIAGNOSIS — C549 Malignant neoplasm of corpus uteri, unspecified: Secondary | ICD-10-CM

## 2013-11-03 DIAGNOSIS — Z803 Family history of malignant neoplasm of breast: Secondary | ICD-10-CM

## 2013-11-03 DIAGNOSIS — K645 Perianal venous thrombosis: Secondary | ICD-10-CM

## 2013-11-03 DIAGNOSIS — L408 Other psoriasis: Secondary | ICD-10-CM

## 2013-11-03 LAB — CBC WITH DIFFERENTIAL/PLATELET
BASO%: 0.9 % (ref 0.0–2.0)
Basophils Absolute: 0 10*3/uL (ref 0.0–0.1)
EOS%: 5.5 % (ref 0.0–7.0)
Eosinophils Absolute: 0.3 10*3/uL (ref 0.0–0.5)
HCT: 34.4 % — ABNORMAL LOW (ref 34.8–46.6)
HGB: 11.3 g/dL — ABNORMAL LOW (ref 11.6–15.9)
LYMPH%: 23.3 % (ref 14.0–49.7)
MCH: 32.2 pg (ref 25.1–34.0)
MCHC: 32.8 g/dL (ref 31.5–36.0)
MCV: 98.2 fL (ref 79.5–101.0)
MONO#: 0.5 10*3/uL (ref 0.1–0.9)
MONO%: 9.4 % (ref 0.0–14.0)
NEUT#: 3.4 10*3/uL (ref 1.5–6.5)
NEUT%: 60.9 % (ref 38.4–76.8)
Platelets: 171 10*3/uL (ref 145–400)
RBC: 3.51 10*6/uL — ABNORMAL LOW (ref 3.70–5.45)
RDW: 12.6 % (ref 11.2–14.5)
WBC: 5.6 10*3/uL (ref 3.9–10.3)
lymph#: 1.3 10*3/uL (ref 0.9–3.3)

## 2013-11-03 LAB — COMPREHENSIVE METABOLIC PANEL (CC13)
ALT: 15 U/L (ref 0–55)
AST: 20 U/L (ref 5–34)
Albumin: 3.8 g/dL (ref 3.5–5.0)
Alkaline Phosphatase: 67 U/L (ref 40–150)
Anion Gap: 6 mEq/L (ref 3–11)
BUN: 14.3 mg/dL (ref 7.0–26.0)
CO2: 28 mEq/L (ref 22–29)
Calcium: 9.5 mg/dL (ref 8.4–10.4)
Chloride: 106 mEq/L (ref 98–109)
Creatinine: 0.7 mg/dL (ref 0.6–1.1)
Glucose: 109 mg/dl (ref 70–140)
Potassium: 3.7 mEq/L (ref 3.5–5.1)
Sodium: 140 mEq/L (ref 136–145)
Total Bilirubin: <0.2 mg/dL (ref 0.20–1.20)
Total Protein: 7.2 g/dL (ref 6.4–8.3)

## 2013-11-03 NOTE — Progress Notes (Signed)
[place] NEW PATIENT EVALUATION   Name: Veronica Ward Date: 11/03/2013 MRN: [id number] DOB: May 27, 1946  REFERRING PHYSICIAN:  Cindie Laroche DJ:SHFWY, Adams; Constant, Peggy; Kinard, Jeneen Rinks; Laurann Montana, [facility]); Mellody Drown (gyn onc [facility]);Kaplan, Carman Ching, Josph Macho   REASON FOR REFERRAL: recently diagnosed endometrial carcinoma, for consideration of adjuvant chemotherapy   HISTORY OF PRESENT ILLNESS:Veronica Ward is a 68 y.o. female who is seen in consultation, together with her husband, at the request of Dr Cindie Laroche, for consideration of adjuvant therapy for recently diagnosed IA serous endometrial carcinoma. History is from this EMR, [facility] records reviewed for this consultation, CareEverywhere information from second opinion consultation by Dr Mellody Drown [facility] gyn oncology 10-30-13,  and patient/ husband who are excellent historians. Patient had been in usual good health until she noticed vaginal spotting in Feb 2015. She was seen by PCP Dr Glori Bickers and referred to Dr Elly Modena, with endometrial biopsy 09-17-13 ([id number]) with endometrial adenocarcinoma, which appeared to be grade II. She had pelvic and transvaginal US 09-23-13. She was seen in consultation by Dr Josephina Shih, then referred to Dr Clarene Essex at [facility] for earliest surgical availability. She had preoperative CXR at [facility]. Surgery 10-03-13 was robotic assisted total laparoscopic hysterectomy/ BSO/ bilateral pelvic and para-aortic nodes with sentinel node evaluation, tolerated well and patient discharged home on POD #1. [facility] surgical pathology [id number]) showed serous carcinoma FIGO grade 3 without myometrial invasion, no adnexal, serosal or cervical involvement, no LVSI, 0/27 nodes involved (9 para-aortic, 18 pelvic), pT1a N0. She saw Dr Clarene Essex in follow up on 10-05-13, with recommendation for 6 cycles of adjuvant carboplatin and taxol chemotherapy and brachytherapy; she also had consultation with radiation oncology at [facility].  Chemotherapy education was done at [facility] 10-16-13. She had second opinion consultation by Dr Fransisca Connors at [facility] on 10-27-13 (see below). Patient has been recovering well from surgery, tho still some abdominal bloating and gas. Bowels are moving daily with stool softeners; she does have some rectal irritation, known to have internal hemorrhoids. She has some slight dependent pedal edema without cords or tenderness, urinary frequency which seems proportional to pushing po fluids, no dysuria, no fever, no SOB and no significant pain. She is still limiting activity as instructed post op.   I believe that she is to see Dr Clarene Essex again in ~ August.  Husband present for all of discussion and exam, very supportive.   REVIEW OF SYSTEMS as above, also: No usual HA. Good visual acuity with reading glasses. Controlled gum problems, alternates visits with dentist and periodontist every 3 months. No significant environmental allergies or sinus symptoms. Hearing ok. No thyroid disease. Tiny skin nodule anterior neck not tender. No respiratory symptoms. No noted changes on breast self exam. No cardiac symptoms. No GERD. Fairly minimal psoriasis, tiny lesions often purutic. New puritic rash on back. No arthritis. No bleeding. No peripheral neuropathy. No difficulty with IV access with any procedures to date. Walks outdoors regularly tho fatigues more quickly since surgery. Usual weight 101 -104 lbs. Appetite fairly good now. Not sleeping well since diagnosis and up frequently to void.  Remainder of full 10 point review of systems negative.   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 Right knee surgery in [place] after several dislocations Benign left breast biopsy Benign left axillary node biopsy Birthmark removed from anterior neck Tonsillectomy in childhood Basal cell skin cancers Colonoscopy 07-03-2013 internal hemorrhoids only Alben Spittle) Mammograms tomo 06-24-2013 [facility] heterogeneously dense o/w negative   CURRENT MEDICATIONS: reviewed as listed now in EMR. Prescriptions to be sent to pharmacy for decadron 4 mg, five tabs 12 hrs and 6 hrs prior to taxol, each dose with food, #10 for first treatment; for zofran 8 mg 1-2 q 12 hrs prn nausea; for ativan 1 mg  1/2-1 tab SL/po q 6 hr prn nausea. She is also instructed to take ativan night of first chemo whether or not any nausea and zofran AM after chemo whether or not any nausea  PHARMACY [facility]   SOCIAL HISTORY:  Originally from [place] but has lived in multiple places. Graduate school at [employer], where she met husband. Patient has been Professor of English with interest also in history, previously taught in [place], retired from [employer] with this illness. Husband is retired Production manager, had been at [employer], retired from [employer]. One daughter with 2 young children lives in [place], one son with infant twins is moving to [place] in May, one son with 3 children lives in [place] (in film business, that family veganWalks outdoors regularly tho fatigues more quickly since surgery. Previously enjoyed [place] folk dances with group in [place]. Plays piano. Remote minimal cigarettes "3 when I had to write a thesis" none since 1995. Wine ~ 3x weekly.  FAMILY HISTORY:  She is of Ashkenazi Jewish descent Father heart disease and multiple myeloma Mother living age 49, had hysterectomy age 55, TIAs Maternal aunt breast cancer Maternal aunt ovarian cancer Maternal grandfather GI or pancreatic cancer,  died age 40 Brother died with MS age 34 Sister living age 36, healthy Daughter, 2 sons healthy 7 grands healthy          PHYSICAL EXAM:  height is '5\' 3"'  (1.6 m) and weight is 103 lb 11.2 oz (47.038 kg). Her oral temperature is 98.8 F (37.1 C). Her blood pressure is 152/80 and her pulse is 75. Her respiration is 18.  Alert, pleasant, cooperative lady looks stated age, fully oriented and appropriate. Easily mobile.   HEENT: Normal hair pattern. PERRL, not icteric. Oral mucosa and posterior pharynx clear and moist. Neck supple without JVD or thyroid mass. Well healed scar anterior lower neck. 1 mm flesh colored skin papule ~ 2 cm above scar, not tender.  RESPIRATORY: Lungs clear to auscultation and percussion, no wheezes or rales. Respirations not labored RA  CARDIAC/ VASCULAR: heart RRR without murmur or gallop. Peripheral pulses 2+  ABDOMEN:soft, not tender, incisions from laparoscopic surgery closed, nontender. Not obviously distended tho patient reports this is not baseline. Some bowel sounds. No appreciable HSM or mass  LYMPH NODES:no cervical, supraclavicular, axillary or inguinal adenopathy  BREASTS: left superior biopsy scar well healed. Bilaterally no dominant mass, skin or nipple findings of concern  NEUROLOGIC:CN, motor, sensory, cerebellar nonfocal. PSYCH: mood and affect appropriate  SKIN: minimal palpable, slightly erythematous, slightly scaling lesions <=56m dorsal forearm and right lower back Per patient, area on forearm is typical of her psoriasis, which appears identical to area on low back.  MUSCULOSKELETAL: symmetrical muscle mass. Back nontender. LE no edema, cords, tenderness. Peripheral veins in UE look very adequate for chemotherapy use.  LABORATORY DATA:  Results for orders placed in visit on 11/03/13 (from the past 48 hour(s))  CBC WITH DIFFERENTIAL     Status: Abnormal   Collection Time    11/03/13  3:29 PM      Result Value Ref Range   WBC 5.6   3.9 - 10.3 10e3/uL   NEUT# 3.4  1.5 - 6.5 10e3/uL   HGB 11.3 (*) 11.6 - 15.9 g/dL   HCT 34.4 (*) 34.8 - 46.6 %   Platelets 171  145 - 400 10e3/uL   MCV 98.2  79.5 - 101.0 fL   MCH 32.2  25.1 - 34.0 pg   MCHC 32.8  31.5 - 36.0 g/dL   RBC 3.51 (*) 3.70 - 5.45 10e6/uL   RDW 12.6  11.2 - 14.5 %   lymph# 1.3  0.9 - 3.3 10e3/uL   MONO# 0.5  0.1 - 0.9 10e3/uL   Eosinophils Absolute 0.3  0.0 - 0.5 10e3/uL   Basophils Absolute 0.0  0.0 - 0.1 10e3/uL   NEUT% 60.9  38.4 - 76.8 %   LYMPH% 23.3  14.0 - 49.7 %   MONO% 9.4  0.0 - 14.0 %   EOS% 5.5  0.0 - 7.0 %   BASO% 0.9  0.0 - 2.0 %  COMPREHENSIVE METABOLIC PANEL (KC12)     Status: None   Collection Time    11/03/13  3:30 PM      Result Value Ref Range   Sodium 140  136 - 145 mEq/L   Potassium 3.7  3.5 - 5.1 mEq/L   Chloride 106  98 - 109 mEq/L   CO2 28  22 - 29 mEq/L   Glucose 109  70 - 140 mg/dl   BUN 14.3  7.0 - 26.0 mg/dL   Creatinine 0.7  0.6 - 1.1 mg/dL   Total Bilirubin <0.20  0.20 - 1.20 mg/dL   Alkaline Phosphatase 67  40 - 150 U/L   AST 20  5 - 34 U/L   ALT 15  0 - 55 U/L   Total Protein 7.2  6.4 - 8.3 g/dL   Albumin 3.8  3.5 - 5.0 g/dL   Calcium 9.5  8.4 - 10.4 mg/dL   Anion Gap 6  3 - 11 mEq/L      Labs all available during visit and discussed with patient  PATHOLOGY: MICHOL, EMORY F Collected: 09/17/2013 Accession: [id number]  Diagnosis Endometrium, biopsy, uterus, lining - POSITIVE FOR ENDOMETRIAL ADENOCARCINOMA. - SEE COMMENT. Microscopic Comment Due to the limited amount of malignant fragments present, precise grading and characterization is best reserved for hysterectomy specimen but as sampled the tumor appears to be endometrioid type and grade II.    Surgical pathology exam3/13/2015  [facility]  Result Narrative  Patient:     DALIS, BEERS MRN:         [id number] DOB (Age):   [birth year] (Age: 3) Collected:   10/03/2013 Received:    10/03/2013 Completed:   10/07/2013  Surgical Pathology  Report Accession #:   [id number]  Diagnosis: A: Uterus, cervix, left and right fallopian tubes and ovaries, total hysterectomy and bilateral salpingo-oophorectomy   Histologic type:     serous carcinoma        Histologic grade:     FIGO grade 3   Tumor site:     anterior and posterior endometrium        Myometrial invasion: not identified       Serosal involvement:  not identified                  Lower uterine segment involvement:     not identified                  Cervical involvement:     not identified             Adnexal involvement:     not identified        Other involved sites:     not applicable        Cervical/vaginal margin and distance:          widely negative             Lymphovascular space invasion:     not identified        Regional lymph nodes (see other specimens):      Total number involved:     0           Total number examined:     27       Additional pathologic findings:      Right ovary:     atrophic with endosalpingiosis       Left ovary:     atrophic with endosalpingiosis  Right fallopian tube:     no significant pathologic abnormality       Left fallopian tube:     scattered calcifications and mild cytologic atypia; no diagnostic serous tubal carcinoma in situ is identified (see comment) Endometrium:     background cystic atrophy  Cervix:     atrophic  Myometrium:  - Small leiomyoma (1.5 cm) - Scant serosal adhesions         AJCC Pathologic Stage: pT1a pN0  FIGO (2009 classification) Stage Grouping:     IA Note:  This pathologic stage assessment is based on information available at the time of this report, and is subject to change pending clinical review and additional information.   B: Sentinel lymph node #1, presacral, biopsy - One lymph node negative for metastatic carcinoma  (0/1)  - Pancytokeratin immunostain pending and will be reported in an addendum   C: Sentinel lymph node #2, presacral/left common iliac vein, biopsy  -  Four lymph nodes negative for metastatic carcinoma  (0/4)  - Pancytokeratin immunostain pending and will be reported in an addendum   D: Lymph nodes, right aortic, regional resection  - Six lymph nodes negative for metastatic carcinoma (0/6)   E: Lymph nodes, left para-aortic, biopsy  - Three lymph nodes, negative for metastatic carcinoma (0/3)   F: Lymph nodes, right pelvic, biopsy - One lymph node, negative for metastatic carcinoma (0/1)    G: Lymph nodes, left pelvic, regional resection - Twelve lymph nodes negative for metastatic carcinoma (0/12)   Comment: The carcinoma shows a papillary pattern with high grade nuclei consistent with serous carcinoma.  Immunostains for confirmation are pending (Ki-67 and P53) and Her-2 is pending on a representative section (A7).  There is focal mild atypia of the left fallopian tube (A15) pending Ki-67 and P53 for further classification and results will be reported in an addendum.  The differential diagnosis includes  reactive changes in the fallopian tube epithelium.  No diagnostic serous tubal carcinoma in situ is identified.  Pancytokeratin is performed on the sentinel lymph nodes (B1, C1-2) and results will be issued in an addendum.    Addendum This addendum is issued to report immunostains.  Addendum Comment Pancytokeratin is negative on sentinel lymph  nodes (B1, C1 and C2). No metastatic carcinoma is identified. The previous diagnoses are unchanged.  Immunostains are performed on a representative section of tumor (A7) to confirm serous subtype:  P53 strongly and diffusely positive P16 strongly and diffusely positive  Her-2 is pending image analysis and result to be issued in a second addendum  The immunostaining pattern supports serous carcinoma. The previous diagnoses are unchanged.   There is focal mild cytologic atypia seen in a representative section of fallopian tube (A15). Immunostains show P53 is negative and Ki-67 stains  rare nuclei. The findings are supportive of reactive atypia and no diagnostic serous tubal in situ carcinoma of the fallopian tube is identified.      RADIOGRAPHYFestus Aloe CXR 09-25-2013 [id number] UN 09/25/13 14:34:[id number] [facility]) : XR CHEST PA AND LATERAL  DICTATED: 09/25/13 14:50:36  CLINICAL INDICATION: 68 Year Old (F) with history of: 182.0 - Malignant neoplasm of corpus uteri, except isthmus, OTHER,   TECHNIQUE: PA and lateral chest  COMPARISON: None  FINDINGS: The cardiac silhouette is within normal limits for size and contour. There is a convexity along the left heart border just below the left pulmonary artery which may represent atrial appendage on the left side. This is highly suggestive of prior rheumatic heart disease. The lungs are slightly hyperinflated with increased AP dimension to the chest. There are no focal infiltrates or effusions.   [place] 06-24-13 and Bone Density 05-15-13 reviewed in EMR   From CareEverywhere: [place] second opinion consultation 10-30-13 with Dr Fransisca Connors: Assessment:  Illyana Schorsch is a 68 y.o. diagnosed with IA, grade 3 serous endometrial cancer. Family history of cancer.  Plan:  We discussed options for management including observation versus chemotherapy. She meets intermediate risk criteria based on age and grade of tumor. We discussed risk of recurrence around 15-20%. We discussed the lack of a randomized controlled trial that demonstrates an improvement with treatment. We discussed options including brachytherapy, pelvic radiation, and/or chemotherapy. Side effects of treatments were reviewed. She strongly desires treatment even without any definitive evidence of survival benefit. We discussed the GOG 249 study that was overall a negative study but the regimen of 3 cycles of chemotherapy with vaginal brachytherapy was well tolerated. We discussed lack of data for 3 versus 6 cycles and discontinuing chemotherapy if side effects were  intolerable. We encouraged her to proceed with genetic testing given her family history and Jewish ancestry. We discussed maintaining a healthy diet and answered her questions related to dietary intake.     DISCUSSION: We have discussed all of history as above, circumstances surrounding her diagnosis, interventions to date and considerations/ recommendations from Drs Clarene Essex and [employer]. We have discussed rationale for adjuvant therapy for various types of cancers; she understands that there is no way to know if this will benefit her and understands that she can stop treatment at any time if she decides that. She is comfortable with the information that she has and wants to do anything that might improve outcome.  We have also talked at length about activity, diet, supplements, genetic counseling.  I have discussed side effects of chemotherapy, usual schedule for treatments and follow up MD visits/ labs, antiemetics, premedication steroids. Note she does tolerate lower doses of benadryl and is in agreement with trying that, given standard use of benadryl with steroids and famotidine to lessen chance of taxol reactions. .    IMPRESSION / PLAN:  1.IA serous endometrial carcinoma: post TAH/BSO/ pelvic and para-aortic node evaluation at [facility] 10-03-13.  Patient is in agreement with adjuvant taxol and carboplatin chemotherapy, which she hopes to begin within the week. She is to meet with Dr Sondra Come on 11-05-13; note Dr Clarene Essex has suggested brachytherapy concomitant with the chemotherapy. I will see her ~ 1 week and ~ 2 weeks after first chemotherapy, with labs.  2.family history of breast and ovarian cancer, Ashkenazi Jewish descent. Referral to genetics counselor at [facility] 3.osteoporosis: ok to hold Fosamax if nausea from chemo, which hopefully will be only intermittently if so 4.psoriasis: fairly minimal. I do not find any contraindication to continuing Biotin 5.possible allergic reaction to sulfa, which we will  avoid. History does not suggest true allergy to benadryl, tho I will dose reduce for first treatment. 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 7.internal hemorrhoids at recent colonoscopy. WIll suggest sitz baths, could try anusol [facility] or ask Dr Deatra Ina for recommendations if symptoms persist.   Patient and accompanying individuals have had questions answered to their satisfaction and are in agreement with plan above. They can contact this office for questions or concerns at any time prior to next scheduled visit. Chemotherapy orders entered. Antiemetics as noted above. Consent to be signed prior to start of first chemotherapy.  Time spent 75 min, including >50% discussion and coordination of care.    Gordy Levan, MD 11/03/2013 8:09 PM

## 2013-11-03 NOTE — Progress Notes (Signed)
  [place] [facility] CLINICAL SOCIAL WORK PSYCHOSOCIAL ASSESSMENT   Date:  11/03/2013   First Name: Veronica Ward.: F     Last Name: Ward MRN:  [id number]  The patient was referred by self to assess for psychosocial, emotional, spiritual, and practical needs.       Marital Status: Married  Civil Service fast streamer Problems: None Identified    Employment:  retired  Scientist, physiological of Income: [place]: Medicare  Family Problems: None Identified Concerns caring for family needs: None Identified    Emotional Problems: yes  Concerns of Adjustment to Diagnosis/Treatment: yes   Current Symptoms of Anxiety: situational   Current Symptoms of Depression: no  Safety/Risk Concerns: no   Mental Status Exam Orientation: person, place, and time Affect: appropriate Thought: normal      Spiritual/Religious: None identified  Living Situation: with spouse  Functional Status: Independent                                                                                               Strengths and Barriers To Treatment:   Patient Coping Strengths:   Supportive Relationships, Family, Friends, Conservator, museum/gallery and Able to Communicate Effectively                                                                                                Identified Problems/Needs and Barriers to Care:  Adjustment to Illness and Coping/Communication  Counseling and Social Work Interventions and Recommendations:   Brief Counseling/Psychotherapy, Problem-solving teaching/coping strategies, Support Group/Group Counseling and Consult MD   Impressions/Plan:  CSW met with patient and patient's spouse, Veronica Ward, for initial supportive counseling session.  The patient shared her experiences with her "cancer journey" up to this point.  Veronica Ward expressed their concerns and fears related to her diagnosis and treatment plan as well as possible "steps going forward".  CSW validated patient/spouse  feelings and discussed common emotions related to cancer diagnosis.  CSW and patient/family identified positive coping skills such as healthy communication with each other, deep breathing techniques, and exercise- discussed how patient/family can incorporate these skills to better cope with life changes related to cancer.  CSW will continue to support patient/family throughout cancer journey.  Polo Riley, MSW, LCSW, OSW-C Clinical Social Worker [facility] [phone]

## 2013-11-03 NOTE — Progress Notes (Signed)
Checked in new patient with no financial issues.  °

## 2013-11-04 ENCOUNTER — Telehealth: Payer: Self-pay | Admitting: *Deleted

## 2013-11-04 DIAGNOSIS — H538 Other visual disturbances: Secondary | ICD-10-CM | POA: Diagnosis not present

## 2013-11-04 NOTE — Telephone Encounter (Signed)
Per staff message from MD. I have called and gave her husband the appt.

## 2013-11-05 ENCOUNTER — Encounter: Payer: Self-pay | Admitting: Radiation Oncology

## 2013-11-05 ENCOUNTER — Ambulatory Visit
Admission: RE | Admit: 2013-11-05 | Discharge: 2013-11-05 | Disposition: A | Discharge status: Home / Self Care | Payer: Medicare Other | Source: Ambulatory Visit | Attending: Radiation Oncology | Admitting: Radiation Oncology

## 2013-11-05 ENCOUNTER — Encounter: Payer: Self-pay | Admitting: *Deleted

## 2013-11-05 ENCOUNTER — Telehealth: Payer: Self-pay | Admitting: *Deleted

## 2013-11-05 ENCOUNTER — Other Ambulatory Visit: Payer: Self-pay | Admitting: *Deleted

## 2013-11-05 VITALS — BP 130/67 | HR 69 | Temp 98.1°F | Ht 63.0 in | Wt 104.6 lb

## 2013-11-05 DIAGNOSIS — Z510 Encounter for antineoplastic radiation therapy: Secondary | ICD-10-CM | POA: Diagnosis not present

## 2013-11-05 DIAGNOSIS — Z90710 Acquired absence of both cervix and uterus: Secondary | ICD-10-CM | POA: Diagnosis not present

## 2013-11-05 DIAGNOSIS — C541 Malignant neoplasm of endometrium: Secondary | ICD-10-CM

## 2013-11-05 DIAGNOSIS — C549 Malignant neoplasm of corpus uteri, unspecified: Secondary | ICD-10-CM | POA: Insufficient documentation

## 2013-11-05 DIAGNOSIS — Z803 Family history of malignant neoplasm of breast: Secondary | ICD-10-CM | POA: Diagnosis not present

## 2013-11-05 DIAGNOSIS — M810 Age-related osteoporosis without current pathological fracture: Secondary | ICD-10-CM | POA: Diagnosis not present

## 2013-11-05 DIAGNOSIS — Z79899 Other long term (current) drug therapy: Secondary | ICD-10-CM | POA: Insufficient documentation

## 2013-11-05 DIAGNOSIS — Z87891 Personal history of nicotine dependence: Secondary | ICD-10-CM | POA: Diagnosis not present

## 2013-11-05 DIAGNOSIS — Z9079 Acquired absence of other genital organ(s): Secondary | ICD-10-CM | POA: Diagnosis not present

## 2013-11-05 HISTORY — DX: Malignant neoplasm of endometrium: C54.1

## 2013-11-05 MED ORDER — LORAZEPAM 1 MG PO TABS: ORAL_TABLET | ORAL | Status: DC

## 2013-11-05 MED ORDER — DEXAMETHASONE 4 MG PO TABS: ORAL_TABLET | ORAL | Status: DC

## 2013-11-05 MED ORDER — ONDANSETRON HCL 8 MG PO TABS: ORAL_TABLET | ORAL | Status: DC

## 2013-11-05 NOTE — Progress Notes (Signed)
Please see the Nurse Progress Note in the MD Initial Consult Encounter for this patient. 

## 2013-11-05 NOTE — Progress Notes (Signed)
Radiation Oncology         [phone] ________________________________  Initial outpatient Consultation  Name: [facility]onica [facility] MRN: [id number]  Date: 11/05/2013  DOB: 23-Feb-1946  CC:Veronica Pardon, MD  [facility], [facility] *   REFERRING PHYSICIAN: Marti Ward *  DIAGNOSIS: Endometrial cancer, FIGO grade 3,  serous carcinoma    Primary site: Corpus Uteri - Carcinoma   Staging method: AJCC 7th Edition   Clinical: Stage IA (T1a, N0, M0)   Pathologic: Stage IA (T1a, N0, M0)   Summary: Stage IA (T1a, N0, M0)  HISTORY OF PRESENT ILLNESS::Veronica Ward is a 68 y.o. female who is seen out of the courtesy of Dr. Andee Ward- [facility] for an opinion concerning radiation therapy as part of management of patient's recently diagnosed high-grade endometrial cancer. Patient presented in February of this year with vaginal spotting. She immediately sought out medical attention. She underwent endometrial biopsy by Dr. Elly Ward which revealed endometrial adenocarcinoma most likely felt to be grade 2.  The patient proceeded to undergo definitive surgery under the direction of Dr. Clarene [facility] at [facility]. Patient underwent robotic assisted total laparoscopic hysterectomy/BSO/bilateral pelvic and periaortic node with sentinel node evaluation. She did well after surgery and was discharged home on postop day #1. Pathology was upgraded to serous carcinoma, FIGO grade 3, no myometrial invasion. Total of 27 benign lymph nodes were removed (9 from the periaortic and 18 from the pelvis). In light of the high-grade nature of the patient's  malignancy it was recommended she be considered for adjuvant therapy including systemic chemotherapy and consideration for intracavitary brachytherapy treatments. Patient was seen in consultation at [facility] for a second opinion who also agreed with this treatment approach as well as radiation oncology at [facility].  Patient is scheduled to proceed  with her first cycle of chemotherapy next week.  PREVIOUS RADIATION THERAPY: No  PAST MEDICAL HISTORY:  has a past medical history of basal cell carcinoma; Diffuse cystic mastopathy; Headache(784.0); Unspecified hemorrhoids without mention of complication; Other malaise and fatigue; Disorder of bone and cartilage, unspecified; Other psoriasis; Other seborrheic keratosis; Sleep disturbance, unspecified; Predominant disturbance of emotions; Pain in limb; Urticaria, unspecified; History of shingles; Anxiety; Anemia; Dysautonomia; Osteoporosis; and Endometrial cancer.    PAST SURGICAL HISTORY: Past Surgical History  Procedure Laterality Date  . Cervical conization w/bx    . Cervical cancer  1973  . Breast biopsy Left 2001    benign  . Tubal ligation    . Dexa  03/2002    oseopenia  . Colonoscopy  12/04    int. hemorrhoids  . Dexa  07/2004    decreased BMD, osteopenia  . Dexa  02/2007    Osteopenia  . Breast lumpectomy      left underarm  . Birthmark removal  childhood  . Dexa  9/10    Osteopenia slightly worse  . Felon [facility]  12/2010    [facility], [facility] in OR (L index finger)  . Finger surgery    . Knee surgery      right  . Robotic assisted total hysterectomy with bilateral salpingo oopherectomy  10/03/2013    Robotic-assisted total laparoscopic hysterectomy, bilateral salpingo-oophorectomy, bilateral pelvic and para-aortic lymphadenectomy with sentinel lymph node dissection  . Tonsilectomy, adenoidectomy, bilateral myringotomy and tubes    . Axillary lymph node dissection      left    FAMILY HISTORY: family history includes Breast cancer in an other family member; Cancer in her maternal grandfather;  Coronary artery disease in her father; Lupus in her maternal aunt; Multiple myeloma in her father; Multiple sclerosis in her brother; Ovarian cancer in her maternal aunt; Prostate cancer in her maternal uncle; Transient ischemic attack in her mother. There is no history of Colon  cancer.  SOCIAL HISTORY:  reports that she quit smoking about 50 years ago. She has never used smokeless tobacco. She reports that she drinks alcohol. She reports that she does not use illicit drugs.  ALLERGIES: Bactrim; Benadryl; Oxycodone-acetaminophen; and Sulfa antibiotics  MEDICATIONS:  Current Outpatient Prescriptions  Medication Sig Dispense Refill  . Acetaminophen 500 MG coapsule Take by mouth.      Marland Kitchen alendronate ([facility]) 70 MG tablet Take 1 tablet (70 mg total) by mouth every 7 (seven) days. Take with a full glass of water on an empty stomach.  4 tablet  11  . Ascorbic Acid ([facility]) 100 MG tablet Take 100 mg by mouth daily.      . cholecalciferol ([facility]) 400 UNITS TABS Take by mouth.      . co-enzyme Q-10 30 MG capsule Take 30 mg by mouth 3 (three) times daily.      Marland Kitchen docusate sodium (COLACE) 100 MG capsule Take 100 mg by mouth.      . fish oil-omega-3 fatty acids 1000 MG capsule Take by mouth 2 (two) times daily.       . FOLBIC 2.5-25-2 MG TABS TAKE 1 TABLET BY MOUTH DAILY  30 each  3  . ibuprofen (ADVIL,MOTRIN) 200 MG tablet Take 200 mg by mouth as needed for pain.      . Lactobacillus (ACIDOPHILUS) 100 MG CAPS Take 1 capsule by mouth daily.      . magnesium 30 MG tablet Take 30 mg by mouth daily.      . Multiple Veronica (MULTIVITAMIN) tablet Take 1 tablet by mouth daily.      . Biotin 1 MG CAPS Take 1 tablet by mouth daily.      . calcium-[facility] (OSCAL) 250-125 MG-UNIT per tablet Take 1 tablet by mouth 2 (two) times daily.       Marland Kitchen dexamethasone (DECADRON) 4 MG tablet Take 5 tablets (=47m) with food 12 hours and 6 hours prior to taxol  10 tablet  0  . LORazepam (ATIVAN) 1 MG tablet 1/2 to 1 tablet  Under tongue or by mouth every 6 hours as needed for nausea. Also take at bedtime night of first chemo whether or not any nausea.  20 tablet  0  . ondansetron (ZOFRAN) 8 MG tablet 1-2 tablets every 12 hours as needed for nausea. Will not make drowsy. Also take 1 tablet AM  after chemo whether or not any nausea  30 tablet  1   No current facility-administered medications for this encounter.    REVIEW OF SYSTEMS:  A 15 point review of systems is documented in the electronic medical record. This was obtained by the nursing staff. However, Veronica reviewed this with the patient to discuss relevant findings and make appropriate changes.  She has some mild abdominal discomfort since her surgery. Her appetite is improving as well as her energy level. She denies any vaginal bleeding at this time. She denies any urination difficulties or bowel complaints. She denies any problems with extremity swelling cough or breathing problems.   PHYSICAL EXAM:  height is 5' 3" (1.6 m) and weight is 104 lb 9.6 oz (47.446 kg). Her temperature is 98.1 F (36.7 Ward). Her blood pressure is  130/67 and her pulse is 69.   BP 130/67  Pulse 69  Temp(Src) 98.1 F (36.7 Ward)  Ht 5' 3" (1.6 m)  Wt 104 lb 9.6 oz (47.446 kg)  BMI 18.53 kg/m2  LMP 09/17/2000  General Appearance:    Alert, cooperative, no distress, appears stated age, accompanied by her husband on evaluation today   Head:    Normocephalic, without obvious abnormality, atraumatic  Eyes:    PERRL, conjunctiva/corneas clear, EOM's intact,        Nose:   Nares normal, septum midline, mucosa normal, no drainage    or sinus tenderness  Throat:   Lips, mucosa, and tongue normal; teeth and gums normal  Neck:   Supple, symmetrical, trachea midline, no adenopathy;    thyroid:  no enlargement/tenderness/nodules; no carotid   bruit or JVD  Back:     Symmetric, no curvature, ROM normal, no CVA tenderness  Lungs:     Clear to auscultation bilaterally, respirations unlabored  Chest Wall:    No tenderness or deformity   Heart:    Regular rate and rhythm, S1 and S2 normal, no murmur, rub   or gallop     Abdomen:     Soft, non-tender, bowel sounds active all four quadrants,    no masses, no organomegaly, small scars present from her laparoscopic  procedure   Genitalia:    deferred until planning day for intracavitary vaginal brachy therapy treatments      Extremities:   Extremities normal, atraumatic, no cyanosis or edema  Pulses:   2+ and symmetric all extremities  Skin:   Skin color, texture, turgor normal, psoriasis present   Lymph nodes:   Cervical, supraclavicular, and axillary nodes normal, no inguinal adenopathy   Neurologic:    normal strength, sensation and reflexes    throughout     ECOG = 1  1 - Symptomatic but completely ambulatory (Restricted in physically strenuous activity but ambulatory and able to carry out work of a light or sedentary nature. For example, light housework, office work)    LABORATORY DATA:  Lab Results  Component Value Date   WBC 5.6 11/03/2013   HGB 11.3* 11/03/2013   HCT 34.4* 11/03/2013   MCV 98.2 11/03/2013   PLT 171 11/03/2013   NEUTROABS 3.4 11/03/2013   Lab Results  Component Value Date   NA 140 11/03/2013   K 3.7 11/03/2013   CL 106 03/04/2013   CO2 28 11/03/2013   GLUCOSE 109 11/03/2013   CREATININE 0.7 11/03/2013   CALCIUM 9.5 11/03/2013      RADIOGRAPHY: No results found.    IMPRESSION: Stage IA serous endometrial carcinoma. Patient would be at risk for vaginal wall recurrence and [facility] would agree with prior recommendations for adjuvant vaginal brachytherapy. Veronica discussed the treatment course side effects and potential toxicities of radiation therapy in this situation with the patient and her husband. She appears to understand and wishes to proceed with planned course of therapy. At this time it is too early to proceed with her radiation treatments in light of the short interval from her surgery. I do however feel she may be a candidate to proceed with her radiation therapy concomitant with her second cycle of chemotherapy. Overall her performance status is quite good and I do feel she could tolerate both chemotherapy and brachytherapy at the same time.  Veronica discussed the treatment course  side effects and potential toxicities of intracavitary brachytherapy treatments with the patient. She appears to understand and wishes  to proceed with this therapy as part of her overall adjuvant treatment  PLAN: Patient will be scheduled for planning and her first high-dose-rate brachytherapy treatment during the second cycle of chemotherapy. Patient would like to pursue genetic evaluation given her diagnosis of malignancy and family history as well as Ashkenazi Jewish descent. She will be set up for consultation with our genetics counselor. Total time spent in this appointment was 60 minutes including medical records evaluation,  physical examination and coordination of care   ------------------------------------------------  Blair Promise, PhD, MD

## 2013-11-05 NOTE — Progress Notes (Signed)
[place] Psychosocial Distress Screening Clinical Social Work  Clinical Social Work was referred by distress screening protocol.  The patient scored a 5 on the Psychosocial Distress Thermometer which indicates moderate distress. Clinical Social Worker previously met with patient to assess for distress and other psychosocial needs. Please refer to previous CSW note.  CSW followed up with patient in [facility] lobby prior to Medical Oncology/Radiation Oncology appointment.  The patient/spouse continue to report anxiety regarding the unknown and are eager to develop treatment plan and start treatment.  CSW provided brief emotional support.   Clinical Social Worker follow up needed: yes  If yes, follow up plan:  CSW will continue to follow patient throughout cancer journey.  Patient/spouse plan to contact CSW as needs arise.  ONCBCN DISTRESS SCREENING 11/05/2013  Screening Type Initial Screening  Elta [place] the number that describes how much distress you have been experiencing in the past week 5  Practical problem type Food  Emotional problem type Nervousness/Anxiety;Adjusting to illness  Spiritual/Religous concerns type Facing my mortality  Physical Problem type Pain;Sexual problems  Physician notified of physical symptoms Yes    Polo Riley, MSW, LCSW, OSW-C Clinical Social Worker [place] [phone]

## 2013-11-05 NOTE — Telephone Encounter (Signed)
Message copied by Sharlynn Oliphant A on Wed Nov 05, 2013  2:06 PM ------      Message from: Evlyn Clines P      Created: Mon Nov 03, 2013  5:50 PM       [place] for chemo, which may begin as early as this Thursday 4-16      [employer] fine            Decadron 4 mg:  Five tabs (=20 mg) with food 12 hrs and 6 hrs prior to taxol.  #10 NRF            Zofran 8 mg:  1-2 q 12 hrs prn nausea. Will not make drowsy. Also take one tablet AM after chemo whether or not any nausea. #30 1 RF            Ativan 1 mg:  1/2 - 1 every 6 hrs SL or po prn nausea. Will make drowsy and a little forgetful around doses. Also take at hs night of first chemo whether or not any nausea #20 NRF            RN please call her to confirm times of decadron when chemo scheduled. Please review possible taxol aches.       Suggest sitz baths for the rectal discomfort, as this could still be from known internal hemorrhoids even tho not severe constipation.            Thanks      Cc TH, LA       ------

## 2013-11-05 NOTE — Telephone Encounter (Signed)
To call patient at 2:00

## 2013-11-05 NOTE — Telephone Encounter (Signed)
Called patient to review when to take decadron prior to Taxol. Pt wrote down times 12am and 6am. Reviewed nausea meds as directed below. Discussed taxol aches and sitz bath. Pt had some general questions regarding what to bring to infusion room. To call if has further questions

## 2013-11-06 ENCOUNTER — Ambulatory Visit (HOSPITAL_BASED_OUTPATIENT_CLINIC_OR_DEPARTMENT_OTHER): Payer: Medicare Other

## 2013-11-06 VITALS — BP 120/66 | HR 70 | Temp 97.8°F | Resp 16

## 2013-11-06 DIAGNOSIS — C541 Malignant neoplasm of endometrium: Secondary | ICD-10-CM

## 2013-11-06 DIAGNOSIS — Z5111 Encounter for antineoplastic chemotherapy: Secondary | ICD-10-CM | POA: Diagnosis not present

## 2013-11-06 DIAGNOSIS — C549 Malignant neoplasm of corpus uteri, unspecified: Secondary | ICD-10-CM | POA: Diagnosis not present

## 2013-11-06 MED ORDER — ONDANSETRON 16 MG/50ML IVPB (CHCC)
INTRAVENOUS | Status: AC
  Filled 2013-11-06: qty 16

## 2013-11-06 MED ORDER — DEXAMETHASONE SODIUM PHOSPHATE 20 MG/5ML IJ SOLN
20.0000 mg | Freq: Once | INTRAMUSCULAR | Status: AC
Start: 2013-11-06 — End: 2013-11-06
  Administered 2013-11-06: 20 mg via INTRAVENOUS

## 2013-11-06 MED ORDER — DEXAMETHASONE SODIUM PHOSPHATE 20 MG/5ML IJ SOLN
INTRAMUSCULAR | Status: AC
  Filled 2013-11-06: qty 5

## 2013-11-06 MED ORDER — SODIUM CHLORIDE 0.9 % IV SOLN
Freq: Once | INTRAVENOUS | Status: AC
  Administered 2013-11-06: 13:00:00 via INTRAVENOUS

## 2013-11-06 MED ORDER — DIPHENHYDRAMINE HCL 12.5 MG/5ML PO LIQD: 12.5000 mg | Freq: Once | ORAL | Status: DC

## 2013-11-06 MED ORDER — SODIUM CHLORIDE 0.9 % IV SOLN
322.0000 mg | Freq: Once | INTRAVENOUS | Status: AC
  Administered 2013-11-06: 320 mg via INTRAVENOUS
  Filled 2013-11-06: qty 32

## 2013-11-06 MED ORDER — DIPHENHYDRAMINE HCL 50 MG/ML IJ SOLN
INTRAMUSCULAR | Status: AC
  Filled 2013-11-06: qty 1

## 2013-11-06 MED ORDER — ONDANSETRON 16 MG/50ML IVPB (CHCC)
16.0000 mg | Freq: Once | INTRAVENOUS | Status: AC
Start: 2013-11-06 — End: 2013-11-06
  Administered 2013-11-06: 16 mg via INTRAVENOUS

## 2013-11-06 MED ORDER — FAMOTIDINE IN NACL 20-0.9 MG/50ML-% IV SOLN
INTRAVENOUS | Status: AC
  Filled 2013-11-06: qty 50

## 2013-11-06 MED ORDER — DIPHENHYDRAMINE HCL 12.5 MG/5ML PO ELIX
12.5000 mg | ORAL_SOLUTION | Freq: Once | ORAL | Status: AC
  Administered 2013-11-06: 12.5 mg via ORAL
  Filled 2013-11-06: qty 5

## 2013-11-06 MED ORDER — DIPHENHYDRAMINE HCL 50 MG/ML IJ SOLN: 12.5000 mg | Freq: Once | INTRAMUSCULAR | Status: DC

## 2013-11-06 MED ORDER — FAMOTIDINE IN NACL 20-0.9 MG/50ML-% IV SOLN
20.0000 mg | Freq: Once | INTRAVENOUS | Status: AC
Start: 2013-11-06 — End: 2013-11-06
  Administered 2013-11-06: 20 mg via INTRAVENOUS

## 2013-11-06 MED ORDER — SODIUM CHLORIDE 0.9 % IV SOLN
175.0000 mg/m2 | Freq: Once | INTRAVENOUS | Status: AC
  Administered 2013-11-06: 252 mg via INTRAVENOUS
  Filled 2013-11-06: qty 42

## 2013-11-06 NOTE — Patient Instructions (Signed)
[facility] Discharge Instructions for Patients Receiving Chemotherapy  Today you received the following chemotherapy agents: Taxol and Carboplatin   To help prevent nausea and vomiting after your treatment, we encourage you to take your nausea medication as prescribed. You received Benadryl, Zofran, Decadron, and Pepcid before your chemotherapy. Dr. Marko Plume has instructed you to take ativan tonight at bedtime, whether or not you are nauseous. Also take Zofran in the morning on Friday (day after chemo) whether or not you are nauseous.    If you develop nausea and vomiting that is not controlled by your nausea medication, call the clinic.   BELOW ARE SYMPTOMS THAT SHOULD BE REPORTED IMMEDIATELY:  *FEVER GREATER THAN 100.5 F  *CHILLS WITH OR WITHOUT FEVER  NAUSEA AND VOMITING THAT IS NOT CONTROLLED WITH YOUR NAUSEA MEDICATION  *UNUSUAL SHORTNESS OF BREATH  *UNUSUAL BRUISING OR BLEEDING  TENDERNESS IN MOUTH AND THROAT WITH OR WITHOUT PRESENCE OF ULCERS  *URINARY PROBLEMS  *BOWEL PROBLEMS  UNUSUAL RASH Items with * indicate a potential emergency and should be followed up as soon as possible.  Feel free to call the clinic you have any questions or concerns. The clinic phone number is [phone].   Paclitaxel injection What is this medicine? PACLITAXEL (PAK li TAX el) is a chemotherapy drug. It targets fast dividing cells, like cancer cells, and causes these cells to die. This medicine is used to treat ovarian cancer, breast cancer, and other cancers. This medicine may be used for other purposes; ask your health care provider or pharmacist if you have questions. COMMON BRAND NAME(S): Onxol , Taxol What should I tell my health care provider before I take this medicine? They need to know if you have any of these conditions: -blood disorders -irregular heartbeat -infection (especially a virus infection such as chickenpox, cold sores, or herpes) -liver  disease -previous or ongoing radiation therapy -an unusual or allergic reaction to paclitaxel, alcohol, polyoxyethylated castor oil, other chemotherapy agents, other medicines, foods, dyes, or preservatives -pregnant or trying to get pregnant -breast-feeding How should I use this medicine? This drug is given as an infusion into a vein. It is administered in a hospital or clinic by a specially trained health care professional. Talk to your pediatrician regarding the use of this medicine in children. Special care may be needed. Overdosage: If you think you have taken too much of this medicine contact a poison control center or emergency room at once. NOTE: This medicine is only for you. Do not share this medicine with others. What if I miss a dose? It is important not to miss your dose. Call your doctor or health care professional if you are unable to keep an appointment. What may interact with this medicine? Do not take this medicine with any of the following medications: -disulfiram -metronidazole This medicine may also interact with the following medications: -cyclosporine -diazepam -ketoconazole -medicines to increase blood counts like filgrastim, pegfilgrastim, sargramostim -other chemotherapy drugs like cisplatin, doxorubicin, epirubicin, etoposide, teniposide, vincristine -quinidine -testosterone -vaccines -verapamil Talk to your doctor or health care professional before taking any of these medicines: -acetaminophen -aspirin -ibuprofen -ketoprofen -naproxen This list may not describe all possible interactions. Give your health care provider a list of all the medicines, herbs, non-prescription drugs, or dietary supplements you use. Also tell them if you smoke, drink alcohol, or use illegal drugs. Some items may interact with your medicine. What should I watch for while using this medicine? Your condition will be monitored carefully while you  are receiving this medicine. You will  need important blood work done while you are taking this medicine. This drug may make you feel generally unwell. This is not uncommon, as chemotherapy can affect healthy cells as well as cancer cells. Report any side effects. Continue your course of treatment even though you feel ill unless your doctor tells you to stop. In some cases, you may be given additional medicines to help with side effects. Follow all directions for their use. Call your doctor or health care professional for advice if you get a fever, chills or sore throat, or other symptoms of a cold or flu. Do not treat yourself. This drug decreases your body's ability to fight infections. Try to avoid being around people who are sick. This medicine may increase your risk to bruise or bleed. Call your doctor or health care professional if you notice any unusual bleeding. Be careful brushing and flossing your teeth or using a toothpick because you may get an infection or bleed more easily. If you have any dental work done, tell your dentist you are receiving this medicine. Avoid taking products that contain aspirin, acetaminophen, ibuprofen, naproxen, or ketoprofen unless instructed by your doctor. These medicines may hide a fever. Do not become pregnant while taking this medicine. Women should inform their doctor if they wish to become pregnant or think they might be pregnant. There is a potential for serious side effects to an unborn child. Talk to your health care professional or pharmacist for more information. Do not breast-feed an infant while taking this medicine. Men are advised not to father a child while receiving this medicine. What side effects may I notice from receiving this medicine? Side effects that you should report to your doctor or health care professional as soon as possible: -allergic reactions like skin rash, itching or hives, swelling of the face, lips, or tongue -low blood counts - This drug may decrease the number of  white blood cells, red blood cells and platelets. You may be at increased risk for infections and bleeding. -signs of infection - fever or chills, cough, sore throat, pain or difficulty passing urine -signs of decreased platelets or bleeding - bruising, pinpoint red spots on the skin, black, tarry stools, nosebleeds -signs of decreased red blood cells - unusually weak or tired, fainting spells, lightheadedness -breathing problems -chest pain -high or low blood pressure -mouth sores -nausea and vomiting -pain, swelling, redness or irritation at the injection site -pain, tingling, numbness in the hands or feet -slow or irregular heartbeat -swelling of the ankle, feet, hands Side effects that usually do not require medical attention (report to your doctor or health care professional if they continue or are bothersome): -bone pain -complete hair loss including hair on your head, underarms, pubic hair, eyebrows, and eyelashes -changes in the color of fingernails -diarrhea -loosening of the fingernails -loss of appetite -muscle or joint pain -red flush to skin -sweating This list may not describe all possible side effects. Call your doctor for medical advice about side effects. You may report side effects to FDA at 1-800-FDA-1088. Where should I keep my medicine? This drug is given in a hospital or clinic and will not be stored at home. NOTE: This sheet is a summary. It may not cover all possible information. If you have questions about this medicine, talk to your doctor, pharmacist, or health care provider.  2014, [employer]/Gold Standard. (2012-09-02 16:41:21) Carboplatin injection What is this medicine? CARBOPLATIN (KAR boe pla tin) is a chemotherapy  drug. It targets fast dividing cells, like cancer cells, and causes these cells to die. This medicine is used to treat ovarian cancer and many other cancers. This medicine may be used for other purposes; ask your health care provider or  pharmacist if you have questions. COMMON BRAND NAME(S): Paraplatin What should I tell my health care provider before I take this medicine? They need to know if you have any of these conditions: -blood disorders -hearing problems -kidney disease -recent or ongoing radiation therapy -an unusual or allergic reaction to carboplatin, cisplatin, other chemotherapy, other medicines, foods, dyes, or preservatives -pregnant or trying to get pregnant -breast-feeding How should I use this medicine? This drug is usually given as an infusion into a vein. It is administered in a hospital or clinic by a specially trained health care professional. Talk to your pediatrician regarding the use of this medicine in children. Special care may be needed. Overdosage: If you think you have taken too much of this medicine contact a poison control center or emergency room at once. NOTE: This medicine is only for you. Do not share this medicine with others. What if I miss a dose? It is important not to miss a dose. Call your doctor or health care professional if you are unable to keep an appointment. What may interact with this medicine? -medicines for seizures -medicines to increase blood counts like filgrastim, pegfilgrastim, sargramostim -some antibiotics like amikacin, gentamicin, neomycin, streptomycin, tobramycin -vaccines Talk to your doctor or health care professional before taking any of these medicines: -acetaminophen -aspirin -ibuprofen -ketoprofen -naproxen This list may not describe all possible interactions. Give your health care provider a list of all the medicines, herbs, non-prescription drugs, or dietary supplements you use. Also tell them if you smoke, drink alcohol, or use illegal drugs. Some items may interact with your medicine. What should I watch for while using this medicine? Your condition will be monitored carefully while you are receiving this medicine. You will need important blood  work done while you are taking this medicine. This drug may make you feel generally unwell. This is not uncommon, as chemotherapy can affect healthy cells as well as cancer cells. Report any side effects. Continue your course of treatment even though you feel ill unless your doctor tells you to stop. In some cases, you may be given additional medicines to help with side effects. Follow all directions for their use. Call your doctor or health care professional for advice if you get a fever, chills or sore throat, or other symptoms of a cold or flu. Do not treat yourself. This drug decreases your body's ability to fight infections. Try to avoid being around people who are sick. This medicine may increase your risk to bruise or bleed. Call your doctor or health care professional if you notice any unusual bleeding. Be careful brushing and flossing your teeth or using a toothpick because you may get an infection or bleed more easily. If you have any dental work done, tell your dentist you are receiving this medicine. Avoid taking products that contain aspirin, acetaminophen, ibuprofen, naproxen, or ketoprofen unless instructed by your doctor. These medicines may hide a fever. Do not become pregnant while taking this medicine. Women should inform their doctor if they wish to become pregnant or think they might be pregnant. There is a potential for serious side effects to an unborn child. Talk to your health care professional or pharmacist for more information. Do not breast-feed an infant while taking this medicine.  What side effects may I notice from receiving this medicine? Side effects that you should report to your doctor or health care professional as soon as possible: -allergic reactions like skin rash, itching or hives, swelling of the face, lips, or tongue -signs of infection - fever or chills, cough, sore throat, pain or difficulty passing urine -signs of decreased platelets or bleeding - bruising,  pinpoint red spots on the skin, black, tarry stools, nosebleeds -signs of decreased red blood cells - unusually weak or tired, fainting spells, lightheadedness -breathing problems -changes in hearing -changes in vision -chest pain -high blood pressure -low blood counts - This drug may decrease the number of white blood cells, red blood cells and platelets. You may be at increased risk for infections and bleeding. -nausea and vomiting -pain, swelling, redness or irritation at the injection site -pain, tingling, numbness in the hands or feet -problems with balance, talking, walking -trouble passing urine or change in the amount of urine Side effects that usually do not require medical attention (report to your doctor or health care professional if they continue or are bothersome): -hair loss -loss of appetite -metallic taste in the mouth or changes in taste This list may not describe all possible side effects. Call your doctor for medical advice about side effects. You may report side effects to FDA at 1-800-FDA-1088. Where should I keep my medicine? This drug is given in a hospital or clinic and will not be stored at home. NOTE: This sheet is a summary. It may not cover all possible information. If you have questions about this medicine, talk to your doctor, pharmacist, or health care provider.  2014, [employer]/Gold Standard. (2007-10-15 14:38:05)

## 2013-11-06 NOTE — Progress Notes (Signed)
Pt c/o urinary leakage at this time. IV fluids and chemotherapy have been infusing and the patient has received benadryl. Barbaraann Share, RN made aware. Pt informed to void as soon as she feels the urge and if this persists at home after treatment today then to please call the clinic. Pt verbalizes understanding.

## 2013-11-07 ENCOUNTER — Telehealth: Payer: Self-pay

## 2013-11-07 ENCOUNTER — Telehealth: Payer: Self-pay | Admitting: Genetic Counselor

## 2013-11-07 ENCOUNTER — Telehealth: Payer: Self-pay | Admitting: *Deleted

## 2013-11-07 NOTE — Telephone Encounter (Signed)
S/W PATIENT AND GAVE GENETIC APPT FOR 05/20 @ 3:30 W/GENETIC COUNSELOR.

## 2013-11-07 NOTE — Telephone Encounter (Signed)
Called patient to inform of genetic counseling on 12-10-13 @ 3:30 pm with Stefanie Libel, lvm for a return call

## 2013-11-07 NOTE — Telephone Encounter (Signed)
[name] is doing well after her treatment yesterday.  Denies  Nausea, vomiting, or  Achiness. Her face is flushed this am.  Told her that this was from the steriods that she received yesterday and it will disapate as the  day progresses. She had a good bowel movement yesterday.  She feels constipated today. No BM today.  She ia on colace daily.  Suggested that she stop the colace and begin Senokot-S 2 tabs twice a day.  She has miralax on hand.  Told her that if she has not had a good evacuation of stool by tomorrow that she needs to take a 17 g dose of miralax.   She needs to not go longer than 3 days without a BM.  The Zofran can contribute to constipation. She has had some vaginal spotting.  Told her to call if this increases to moderate-heavy bleeding.  Veronica Ward verbalized understanding.   She knows to call [phone] if any further questions or issues arise.

## 2013-11-10 NOTE — Telephone Encounter (Signed)
Spoke with Ms. Cid regarding aches from Taxol.  Warm baths helping. Reassured her that these aches will decrease the futher she gets away from treatment.  She can use ibuprofen or percocet to help decrease pain if needed. She is constipated.  She will take 2 Senokot-S this evening and 17 grams of Miralax.  Will repeat miralx tomorrow if needed and continue with 2-3 Senokot - S bid and adjust dosing per bowel movements. Continue with small frequent meals for nausea.

## 2013-11-11 ENCOUNTER — Other Ambulatory Visit: Payer: Self-pay | Admitting: Oncology

## 2013-11-12 ENCOUNTER — Telehealth: Payer: Self-pay | Admitting: Family Medicine

## 2013-11-12 ENCOUNTER — Telehealth: Payer: Self-pay | Admitting: *Deleted

## 2013-11-12 ENCOUNTER — Telehealth: Payer: Self-pay | Admitting: Oncology

## 2013-11-12 NOTE — Telephone Encounter (Signed)
CALLED PATIENT TO INFORM OF HDR CASE AND TREATMENTS - SPOKE WITH PATIENT AND SHE IS AWARE OF THESE APPTS.

## 2013-11-12 NOTE — Telephone Encounter (Signed)
I think she should hold the fosamax while on chemo- I think it will be ok

## 2013-11-12 NOTE — Telephone Encounter (Signed)
Veronica Ward called and asked if it was OK to take the Yoga class for cancer survivors/patient's on Tuesdays after her HDR treatments.  Advised her that it is OK to take Yoga and she does not need to limit her activity.

## 2013-11-12 NOTE — Telephone Encounter (Signed)
Caller: Veronica Ward/Patient; Phone: [phone]; Reason for Call: Calling about Fosamax; started chemo 11/06/13; having normal chemo sxs like nausea and constipation; says she wakes each morning at 0530 and eats some wheat thins and falls back to sleep; gets up at 0700; says she normally takes Fosamax on Tuesdays and missed it yesterday; says oncologist says she can skip Fosamax for awhile, but she's concerned about her osteoporosis; says her stomach normally settles once she eats a little something and fears she can't take the Fosamax on an empty stomach and stay up a full 61mins like she normally would; would like some guidance; please call back

## 2013-11-12 NOTE — Telephone Encounter (Signed)
Pt notified to hold fosamax while on chemo, pt said she finishes chemo mid Aug. so will stop it for now

## 2013-11-12 NOTE — Telephone Encounter (Signed)
Caller: Veronica Ward/Patient; Phone: [phone]; Reason for Call: Calling about Fosamax; started chemo 11/06/13; having normal chemo sxs like nausea and constipation; says she wakes each morning at 0530 and eats some wheat thins and falls back to sleep; gets up at 0700; says she normally takes Fosamax on Tuesdays and missed it yesterday; says oncologist says she can skip Fosamax for awhile, but she's concerned about her osteoporosis; says her stomach normally settles once she eats a little something and fears she can't take the Fosamax on an empty stomach and stay up a full 30mins like she normally would; would like some guidance; please call back ° °

## 2013-11-13 ENCOUNTER — Other Ambulatory Visit: Payer: Self-pay

## 2013-11-13 ENCOUNTER — Encounter: Payer: Self-pay | Admitting: Specialist

## 2013-11-13 DIAGNOSIS — C541 Malignant neoplasm of endometrium: Secondary | ICD-10-CM

## 2013-11-13 NOTE — Progress Notes (Signed)
Patient's spouse attended the caregiver support group today. He appreciates both the workshop and the support group and said he will come back to the support group next month.  Epifania Gore, [facility], PhD Chaplain

## 2013-11-14 ENCOUNTER — Ambulatory Visit (HOSPITAL_BASED_OUTPATIENT_CLINIC_OR_DEPARTMENT_OTHER): Payer: Medicare Other | Admitting: Oncology

## 2013-11-14 ENCOUNTER — Other Ambulatory Visit (HOSPITAL_BASED_OUTPATIENT_CLINIC_OR_DEPARTMENT_OTHER): Payer: Medicare Other

## 2013-11-14 ENCOUNTER — Encounter: Payer: Self-pay | Admitting: Oncology

## 2013-11-14 ENCOUNTER — Telehealth: Payer: Self-pay | Admitting: Oncology

## 2013-11-14 VITALS — BP 125/75 | HR 76 | Temp 98.4°F | Resp 18 | Ht 63.0 in | Wt 102.7 lb

## 2013-11-14 DIAGNOSIS — K648 Other hemorrhoids: Secondary | ICD-10-CM

## 2013-11-14 DIAGNOSIS — C549 Malignant neoplasm of corpus uteri, unspecified: Secondary | ICD-10-CM

## 2013-11-14 DIAGNOSIS — M810 Age-related osteoporosis without current pathological fracture: Secondary | ICD-10-CM

## 2013-11-14 DIAGNOSIS — Z5189 Encounter for other specified aftercare: Secondary | ICD-10-CM

## 2013-11-14 DIAGNOSIS — Z8041 Family history of malignant neoplasm of ovary: Secondary | ICD-10-CM | POA: Diagnosis not present

## 2013-11-14 DIAGNOSIS — C541 Malignant neoplasm of endometrium: Secondary | ICD-10-CM

## 2013-11-14 DIAGNOSIS — Z803 Family history of malignant neoplasm of breast: Secondary | ICD-10-CM | POA: Diagnosis not present

## 2013-11-14 LAB — CBC WITH DIFFERENTIAL/PLATELET
BASO%: 1.3 % (ref 0.0–2.0)
Basophils Absolute: 0 10*3/uL (ref 0.0–0.1)
EOS%: 6.9 % (ref 0.0–7.0)
Eosinophils Absolute: 0.2 10*3/uL (ref 0.0–0.5)
HCT: 33.7 % — ABNORMAL LOW (ref 34.8–46.6)
HGB: 11.1 g/dL — ABNORMAL LOW (ref 11.6–15.9)
LYMPH%: 30.6 % (ref 14.0–49.7)
MCH: 31.9 pg (ref 25.1–34.0)
MCHC: 32.9 g/dL (ref 31.5–36.0)
MCV: 96.8 fL (ref 79.5–101.0)
MONO#: 0.2 10*3/uL (ref 0.1–0.9)
MONO%: 6.6 % (ref 0.0–14.0)
NEUT#: 1.8 10*3/uL (ref 1.5–6.5)
NEUT%: 54.6 % (ref 38.4–76.8)
Platelets: 182 10*3/uL (ref 145–400)
RBC: 3.48 10*6/uL — ABNORMAL LOW (ref 3.70–5.45)
RDW: 12.7 % (ref 11.2–14.5)
WBC: 3.2 10*3/uL — ABNORMAL LOW (ref 3.9–10.3)
lymph#: 1 10*3/uL (ref 0.9–3.3)

## 2013-11-14 MED ORDER — TBO-FILGRASTIM 300 MCG/0.5ML ~~LOC~~ SOSY
300.0000 ug | PREFILLED_SYRINGE | Freq: Once | SUBCUTANEOUS | Status: AC
  Administered 2013-11-14: 300 ug via SUBCUTANEOUS
  Filled 2013-11-14: qty 0.5

## 2013-11-14 NOTE — Progress Notes (Signed)
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 She had taxol aches beginning day 3, some better with ibuprofen and tylenol, now resolved. She had queasiness beginning day 3, better with bland foods and more bothersome in AMs. She was constipated after treatment, changed stool softener to senokotS and miralax with resolution. She was able to attend [facility] yoga class earlier this week. No peripheral neuropathy symptoms.  She does not have PAC. Peripheral IV access for first treatment required 2 attempts, tho veins appear adequate for peripheral chemo administration.  She had consultation with Dr Sondra Come on 11-05-13, with plan for brachytherapy beginning during cycle 2 chemo, which is due 11-27-13. Return appointment to Dr Sondra Come is 12-02-13.  ONCOLOGIC HISTORY Patient had been in usual good health until she noticed vaginal spotting in Feb 2015. She was seen by PCP Dr Glori Bickers and referred to Dr Elly Modena, with endometrial biopsy 09-17-13 ([id number]) with endometrial adenocarcinoma, which appeared to be grade II. She had pelvic and transvaginal US 09-23-13. She was seen in consultation by Dr Josephina Shih, then referred to Dr Clarene Essex at [facility] for earliest surgical availability. She had preoperative CXR at [facility]. Surgery 10-03-13 was robotic assisted total laparoscopic hysterectomy/ BSO/ bilateral pelvic and para-aortic nodes with sentinel node evaluation, tolerated well and patient discharged home on POD #1. [facility] surgical pathology [id number]) showed serous carcinoma FIGO grade 3 without myometrial invasion, no adnexal, serosal or cervical involvement, no LVSI, 0/27 nodes involved (9 para-aortic, 18  pelvic), pT1a N0. She saw Dr Clarene Essex in follow up on 10-05-13, with recommendation for 6 cycles of adjuvant carboplatin and taxol chemotherapy and brachytherapy; she also had consultation with radiation oncology at [facility]. Chemotherapy education was done at [facility] 10-16-13. She had second opinion consultation by Dr Fransisca Connors at [facility]. First taxol carboplatin was given 11-06-13. Plan is for brachytherapy beginning around cycle 2 chemo.    Review of systems as above, also: Slight bleeding from hemorrhoids with BM. Weight was down to 99 lbs within this past week; she added Ensure plus daily. Appetite much better now. No fever or symptoms of infection. No smell disturbance. Remainder of 10 point Review of Systems negative.  Objective:  Vital signs in last 24 hours:  BP 125/75  Pulse 76  Temp(Src) 98.4 F (36.9 C) (Oral)  Resp 18  Ht 5\' 3"  (1.6 m)  Wt 102 lb 11.2 oz (46.584 kg)  BMI 18.20 kg/m2  LMP 09/17/2000 weight is down 1 lb from 11-03-13.  Alert, oriented and appropriate. Ambulatory without difficulty. Looks comfortable, detailed conversation. No alopecia  HEENT:PERRL, sclerae not icteric. Oral mucosa moist without lesions, posterior pharynx clear.  Neck supple. No JVD.  Lymphatics:no cervical,suraclavicular, axillary or inguinal adenopathy Resp: clear to auscultation bilaterally and normal percussion bilaterally Cardio: regular rate and rhythm. No gallop. GI: soft, nontender, not distended, no mass or organomegaly. Normally active bowel sounds. Surgical incisions not remarkable. Musculoskeletal/ Extremities: without pitting edema, cords, tenderness Neuro: no peripheral neuropathy. Otherwise nonfocal Skin without rash, ecchymosis, petechiae other than resolving ecchymosis 3x4 cm left inner forearm at site of the initial IV attempt. No erythema, heat, streaking, cord at IV sites.   Lab Results:  Results for orders placed in visit on 11/14/13  CBC WITH  DIFFERENTIAL       Result Value Ref Range   WBC 3.2 (*) 3.9 - 10.3 10e3/uL   NEUT# 1.8  1.5 - 6.5 10e3/uL   HGB 11.1 (*) 11.6 - 15.9 g/dL   HCT 33.7 (*) 34.8 - 46.6 %   Platelets 182  145 - 400 10e3/uL   MCV 96.8  79.5 - 101.0 fL   MCH 31.9  25.1 - 34.0 pg   MCHC 32.9  31.5 - 36.0 g/dL   RBC 3.48 (*) 3.70 - 5.45 10e6/uL   RDW 12.7  11.2 - 14.5 %   lymph# 1.0  0.9 - 3.3 10e3/uL   MONO# 0.2  0.1 - 0.9 10e3/uL   Eosinophils Absolute 0.2  0.0 - 0.5 10e3/uL   Basophils Absolute 0.0  0.0 - 0.1 10e3/uL   NEUT% 54.6  38.4 - 76.8 %   LYMPH% 30.6  14.0 - 49.7 %   MONO% 6.6  0.0 - 14.0 %   EOS% 6.9  0.0 - 7.0 %   BASO% 1.3  0.0 - 2.0 %     Studies/Results:  No results found.  Medications: I have reviewed the patient's current medications. Add gCSF as granix today and 4-25, then check CBC on 4-27 and continue if needed (if neutropenic or if ANC <1.5 with counts not past nadir as determined by platelet count).  DISCUSSION: we have discussed events around and since first chemo in detail. She will try aleve/ naproxen for aches beginning several hours prior to expected onset for subsequent treatments. She will need to be sure bowels move well day prior to chemo and use laxatives daily around next treatments. We have discussed diet and I have encouraged the suppement, activity and intimacy.  Counts do not appear yet at nadir, with ANC 1.8 now day 9 cycle 1. Will add neupogen today and 4-25, then   Assessment/Plan: 1.IA high grade serous endometrial carcinoma: post TAH/BSO/ pelvic and para-aortic node evaluation at [facility] 10-03-13 and first taxol carbo on 11-06-13. Add neupogen today, CBC to direct neupogen on 4-27. I will see her next week as planned, cycle 2 due 5-7. Plan HDR beginning during cycle 2 2.family history of breast and ovarian cancer, Ashkenazi Jewish descent. Genetics counseling at [facility] 12-10-13.  3.osteoporosis: ok to hold Fosamax if nausea from chemo, which hopefully will be only intermittently if so   4.psoriasis: stable 5.possible allergic reaction to sulfa, which we will avoid. Tolerated dose reduced benadryl cycle 1; history does not support true allergy to that medication so will remove from listed allergies. 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  7.internal hemorrhoids at recent colonoscopy. Will need to add sitz baths if slight bleeding continues.   Patient had all questions answered to her satisfaction and is in agreement with plan. TIme spent 30 min, >50% counseling and coordination of care    Gordy Levan, MD   11/14/2013, 5:02 PM

## 2013-11-14 NOTE — Telephone Encounter (Signed)
, °

## 2013-11-15 ENCOUNTER — Ambulatory Visit (HOSPITAL_BASED_OUTPATIENT_CLINIC_OR_DEPARTMENT_OTHER): Payer: Medicare Other

## 2013-11-15 VITALS — BP 116/56 | HR 73 | Temp 97.7°F | Resp 18

## 2013-11-15 DIAGNOSIS — C549 Malignant neoplasm of corpus uteri, unspecified: Secondary | ICD-10-CM | POA: Diagnosis not present

## 2013-11-15 DIAGNOSIS — Z5189 Encounter for other specified aftercare: Secondary | ICD-10-CM | POA: Diagnosis not present

## 2013-11-15 MED ORDER — TBO-FILGRASTIM 300 MCG/0.5ML ~~LOC~~ SOSY
300.0000 ug | PREFILLED_SYRINGE | Freq: Once | SUBCUTANEOUS | Status: AC
  Administered 2013-11-15: 300 ug via SUBCUTANEOUS

## 2013-11-17 ENCOUNTER — Ambulatory Visit: Payer: Medicare Other

## 2013-11-17 ENCOUNTER — Other Ambulatory Visit (HOSPITAL_BASED_OUTPATIENT_CLINIC_OR_DEPARTMENT_OTHER): Payer: Medicare Other

## 2013-11-17 DIAGNOSIS — C541 Malignant neoplasm of endometrium: Secondary | ICD-10-CM

## 2013-11-17 DIAGNOSIS — C549 Malignant neoplasm of corpus uteri, unspecified: Secondary | ICD-10-CM | POA: Diagnosis not present

## 2013-11-17 LAB — CBC WITH DIFFERENTIAL/PLATELET
BASO%: 1.2 % (ref 0.0–2.0)
Basophils Absolute: 0.1 10*3/uL (ref 0.0–0.1)
EOS%: 5 % (ref 0.0–7.0)
Eosinophils Absolute: 0.2 10*3/uL (ref 0.0–0.5)
HCT: 34.4 % — ABNORMAL LOW (ref 34.8–46.6)
HGB: 11.5 g/dL — ABNORMAL LOW (ref 11.6–15.9)
LYMPH%: 23.7 % (ref 14.0–49.7)
MCH: 32.5 pg (ref 25.1–34.0)
MCHC: 33.3 g/dL (ref 31.5–36.0)
MCV: 97.4 fL (ref 79.5–101.0)
MONO#: 0.9 10*3/uL (ref 0.1–0.9)
MONO%: 19.6 % — ABNORMAL HIGH (ref 0.0–14.0)
NEUT#: 2.4 10*3/uL (ref 1.5–6.5)
NEUT%: 50.5 % (ref 38.4–76.8)
Platelets: 211 10*3/uL (ref 145–400)
RBC: 3.53 10*6/uL — ABNORMAL LOW (ref 3.70–5.45)
RDW: 12.8 % (ref 11.2–14.5)
WBC: 4.8 10*3/uL (ref 3.9–10.3)
lymph#: 1.1 10*3/uL (ref 0.9–3.3)

## 2013-11-17 NOTE — Progress Notes (Signed)
Veronica Ward here for labs and possible Granix injection.  Orders are if [place] <1.5 and platelets dropping, she may need Granix injection.  ANC today 2.4 and platelets increased to 211.  No injection given and Dr Marko Plume notified. Patient aware of lab results and that she doesn't need injection today.

## 2013-11-19 ENCOUNTER — Ambulatory Visit (HOSPITAL_BASED_OUTPATIENT_CLINIC_OR_DEPARTMENT_OTHER): Payer: Medicare Other | Admitting: Oncology

## 2013-11-19 ENCOUNTER — Other Ambulatory Visit (HOSPITAL_BASED_OUTPATIENT_CLINIC_OR_DEPARTMENT_OTHER): Payer: Medicare Other

## 2013-11-19 VITALS — BP 120/74 | HR 93 | Temp 98.1°F | Resp 18 | Ht 63.0 in | Wt 103.6 lb

## 2013-11-19 DIAGNOSIS — C549 Malignant neoplasm of corpus uteri, unspecified: Secondary | ICD-10-CM | POA: Diagnosis not present

## 2013-11-19 DIAGNOSIS — C541 Malignant neoplasm of endometrium: Secondary | ICD-10-CM

## 2013-11-19 DIAGNOSIS — Z8041 Family history of malignant neoplasm of ovary: Secondary | ICD-10-CM | POA: Diagnosis not present

## 2013-11-19 DIAGNOSIS — M810 Age-related osteoporosis without current pathological fracture: Secondary | ICD-10-CM

## 2013-11-19 DIAGNOSIS — Z803 Family history of malignant neoplasm of breast: Secondary | ICD-10-CM

## 2013-11-19 LAB — COMPREHENSIVE METABOLIC PANEL (CC13)
ALT: 17 U/L (ref 0–55)
AST: 22 U/L (ref 5–34)
Albumin: 3.8 g/dL (ref 3.5–5.0)
Alkaline Phosphatase: 75 U/L (ref 40–150)
Anion Gap: 12 mEq/L — ABNORMAL HIGH (ref 3–11)
BUN: 15 mg/dL (ref 7.0–26.0)
CO2: 26 mEq/L (ref 22–29)
Calcium: 9.9 mg/dL (ref 8.4–10.4)
Chloride: 104 mEq/L (ref 98–109)
Creatinine: 0.7 mg/dL (ref 0.6–1.1)
Glucose: 109 mg/dl (ref 70–140)
Potassium: 3.9 mEq/L (ref 3.5–5.1)
Sodium: 142 mEq/L (ref 136–145)
Total Bilirubin: <0.2 mg/dL (ref 0.20–1.20)
Total Protein: 7.3 g/dL (ref 6.4–8.3)

## 2013-11-19 LAB — CBC WITH DIFFERENTIAL/PLATELET
BASO%: 0.8 % (ref 0.0–2.0)
Basophils Absolute: 0.1 10*3/uL (ref 0.0–0.1)
EOS%: 2 % (ref 0.0–7.0)
Eosinophils Absolute: 0.1 10*3/uL (ref 0.0–0.5)
HCT: 34.7 % — ABNORMAL LOW (ref 34.8–46.6)
HGB: 11.6 g/dL (ref 11.6–15.9)
LYMPH%: 13.1 % — ABNORMAL LOW (ref 14.0–49.7)
MCH: 32.5 pg (ref 25.1–34.0)
MCHC: 33.3 g/dL (ref 31.5–36.0)
MCV: 97.5 fL (ref 79.5–101.0)
MONO#: 0.9 10*3/uL (ref 0.1–0.9)
MONO%: 13 % (ref 0.0–14.0)
NEUT#: 5.2 10*3/uL (ref 1.5–6.5)
NEUT%: 71.1 % (ref 38.4–76.8)
Platelets: 217 10*3/uL (ref 145–400)
RBC: 3.56 10*6/uL — ABNORMAL LOW (ref 3.70–5.45)
RDW: 12.9 % (ref 11.2–14.5)
WBC: 7.3 10*3/uL (ref 3.9–10.3)
lymph#: 1 10*3/uL (ref 0.9–3.3)

## 2013-11-19 MED ORDER — DEXAMETHASONE 4 MG PO TABS: ORAL_TABLET | ORAL | Status: DC

## 2013-11-19 NOTE — Progress Notes (Signed)
OFFICE PROGRESS NOTE   11/19/2013   Physicians:Soper, John,Tower, [place]; Constant, Peggy; Kinard, Jeneen Rinks; Laurann Montana, [place] [facility]); Mellody Drown (gyn onc [facility]);Kaplan, Carman Ching, Josph Macho   INTERVAL HISTORY:  Patient is seen, together with husband, in continuing attention to adjuvant chemotherapy for high grade serous T1aN0 endometrial carcinoma. She had cycle 1 taxol carboplatin on 11-06-13, then neupogen 4-24 and 4-25 as counts were dropping significantly. She is feeling well overall today, with appetite excellent and not fatigued. She has resumed abdominal crunches(did 25, prior to surgery regularly did 100) and done YOGA this week. She is due cycle 2 chemo on 11-27-13 and is to begin HDR ~ 12-02-13. Genetics counseling is set up 12-10-13.  She does not have PAC.  ONCOLOGIC HISTORY Patient had been in usual good health until she noticed vaginal spotting in Feb 2015. She was seen by PCP Dr Glori Bickers, with endometrial biopsy 09-17-13 by Dr Elly Modena [phone]) endometrial adenocarcinoma, which appeared to be grade II. She had pelvic and transvaginal US 09-23-13. She was seen in consultation by Dr Josephina Shih, then referred to Dr Clarene Essex at [facility] for earliest surgical availability. She had preoperative CXR at [facility]. Surgery 10-03-13 was robotic assisted total laparoscopic hysterectomy/ BSO/ bilateral pelvic and para-aortic nodes with sentinel node evaluation, tolerated well and patient discharged home on POD #1. [facility] surgical pathology [phone]) showed serous carcinoma FIGO grade 3 without myometrial invasion, no adnexal, serosal or cervical involvement, no LVSI, 0/27 nodes involved (9 para-aortic, 18 pelvic), pT1a N0. She saw Dr Clarene Essex in follow up on 10-05-13, with recommendation for 6 cycles of adjuvant carboplatin and taxol chemotherapy and brachytherapy; she also had consultation with radiation oncology at [facility]. Chemotherapy education was done at [facility] 10-16-13. She had second opinion  consultation by Dr Fransisca Connors at [facility]. First taxol carboplatin was given 11-06-13. Brachytherapy will begin around cycle 2 chemo.  Review of systems as above, also: Bowels moving. No significant peripheral neuropathy.No abdominal or pelvic pain. No bleeding. Remainder of 10 point Review of Systems negative.  Objective:  Vital signs in last 24 hours:  BP 120/74  Pulse 93  Temp(Src) 98.1 F (36.7 C) (Oral)  Resp 18  Ht 5\' 3"  (1.6 m)  Wt 103 lb 9.6 oz (46.993 kg)  BMI 18.36 kg/m2  SpO2 97%  LMP 09/17/2000 weight is up 1 lb.  Alert, oriented and appropriate. Ambulatory without difficulty.   HEENT:PERRL, sclerae not icteric. Oral mucosa moist without lesions, posterior pharynx clear.  Neck supple. No JVD.  Lymphatics:no cervical,suraclavicular, axillary or inguinal adenopathy Resp: clear to auscultation bilaterally and normal percussion bilaterally Cardio: regular rate and rhythm. No gallop. GI: soft, nontender, not distended, no mass or organomegaly. Normal bowel sounds. Surgical incision not remarkable. Musculoskeletal/ Extremities: without pitting edema, cords, tenderness Neuro: no peripheral neuropathy. Otherwise nonfocal Skin without rash, ecchymosis, petechiae   Lab Results:  Results for orders placed in visit on 11/19/13  CBC WITH DIFFERENTIAL      Result Value Ref Range   WBC 7.3  3.9 - 10.3 10e3/uL   NEUT# 5.2  1.5 - 6.5 10e3/uL   HGB 11.6  11.6 - 15.9 g/dL   HCT 34.7 (*) 34.8 - 46.6 %   Platelets 217  145 - 400 10e3/uL   MCV 97.5  79.5 - 101.0 fL   MCH 32.5  25.1 - 34.0 pg   MCHC 33.3  31.5 - 36.0 g/dL   RBC 3.56 (*) 3.70 - 5.45 10e6/uL   RDW 12.9  11.2 - 14.5 %  lymph# 1.0  0.9 - 3.3 10e3/uL   MONO# 0.9  0.1 - 0.9 10e3/uL   Eosinophils Absolute 0.1  0.0 - 0.5 10e3/uL   Basophils Absolute 0.1  0.0 - 0.1 10e3/uL   NEUT% 71.1  38.4 - 76.8 %   LYMPH% 13.1 (*) 14.0 - 49.7 %   MONO% 13.0  0.0 - 14.0 %   EOS% 2.0  0.0 - 7.0 %   BASO% 0.8  0.0 - 2.0 %   COMPREHENSIVE METABOLIC PANEL (QJ19)      Result Value Ref Range   Sodium 142  136 - 145 mEq/L   Potassium 3.9  3.5 - 5.1 mEq/L   Chloride 104  98 - 109 mEq/L   CO2 26  22 - 29 mEq/L   Glucose 109  70 - 140 mg/dl   BUN 15.0  7.0 - 26.0 mg/dL   Creatinine 0.7  0.6 - 1.1 mg/dL   Total Bilirubin <0.20  0.20 - 1.20 mg/dL   Alkaline Phosphatase 75  40 - 150 U/L   AST 22  5 - 34 U/L   ALT 17  0 - 55 U/L   Total Protein 7.3  6.4 - 8.3 g/dL   Albumin 3.8  3.5 - 5.0 g/dL   Calcium 9.9  8.4 - 10.4 mg/dL   Anion Gap 12 (*) 3 - 11 mEq/L     Studies/Results:  No results found.  Medications: I have reviewed the patient's current medications. She may need to increase laxatives around chemo treatments. Claritin may help with taxol and with gCSF aches. She will use full premed decadron prior to taxol as cycle 1.  DISCUSSION patient and husband are pleased with how she is tolerating treatment thus far. They have questions about organic foods and sugar intake, tho they realize many lifestyle factors may be important and are not fully understood. WBC recovered well with 2 doses neupogen/ granix; some gCSF will likely be helpful with subsequent treatments especially with comcomitant RT.  Assessment/Plan: 1.IA high grade serous endometrial carcinoma: post TAH/BSO/ pelvic and para-aortic node evaluation at [facility] 10-03-13 and first taxol carbo on 11-06-13.  Plan HDR beginning during cycle 2. She will have cycle 2 on 11-27-13 as long as [place] >=1.5 and plt >=100k; I will see her back 5-13 and will add neupogen/ granix then.  2.family history of breast and ovarian cancer, Ashkenazi Jewish descent. Genetics counseling at [facility] 12-10-13.  3.osteoporosis: ok to hold Fosamax if nausea from chemo, which hopefully will be only intermittently if so  4.minimal psoriasis: stable  5.possible allergic reaction to sulfa, which we will avoid. Tolerated dose reduced benadryl cycle 1; history does not support true allergy to that  medication so will remove from listed allergies.  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  7.internal hemorrhoids at recent colonoscopy. Prn sitz baths if slight bleeding continues.  Patient and husband are in agreement with plan. Chemo orders confirmed by labs today.  Gordy Levan, MD   11/19/2013, 3:51 PM

## 2013-11-21 ENCOUNTER — Encounter: Payer: Self-pay | Admitting: Oncology

## 2013-11-25 ENCOUNTER — Telehealth: Payer: Self-pay | Admitting: *Deleted

## 2013-11-25 NOTE — Telephone Encounter (Signed)
Returned pt's call regarding spotting, pt states she is doing aerobics 2x week, yoga 1x week, and abdominal crunches approximately 28 per day on her own. Discussed with pt she is likely working her abdominal muscles too hard, which may have caused the spotting. Recommended pt stop the activities or movements that stress or strain abdominal muscles, trying walking or light activity instead. Pt verbalized understanding no further concerns.

## 2013-11-27 ENCOUNTER — Ambulatory Visit (HOSPITAL_BASED_OUTPATIENT_CLINIC_OR_DEPARTMENT_OTHER): Payer: Medicare Other | Admitting: Lab

## 2013-11-27 ENCOUNTER — Ambulatory Visit (HOSPITAL_BASED_OUTPATIENT_CLINIC_OR_DEPARTMENT_OTHER): Payer: Medicare Other

## 2013-11-27 DIAGNOSIS — C541 Malignant neoplasm of endometrium: Secondary | ICD-10-CM

## 2013-11-27 DIAGNOSIS — C549 Malignant neoplasm of corpus uteri, unspecified: Secondary | ICD-10-CM

## 2013-11-27 DIAGNOSIS — Z5111 Encounter for antineoplastic chemotherapy: Secondary | ICD-10-CM | POA: Diagnosis not present

## 2013-11-27 LAB — COMPREHENSIVE METABOLIC PANEL (CC13)
ALT: 16 U/L (ref 0–55)
AST: 19 U/L (ref 5–34)
Albumin: 3.7 g/dL (ref 3.5–5.0)
Alkaline Phosphatase: 72 U/L (ref 40–150)
Anion Gap: 9 mEq/L (ref 3–11)
BUN: 20.2 mg/dL (ref 7.0–26.0)
CO2: 25 mEq/L (ref 22–29)
Calcium: 10.1 mg/dL (ref 8.4–10.4)
Chloride: 104 mEq/L (ref 98–109)
Creatinine: 0.7 mg/dL (ref 0.6–1.1)
Glucose: 173 mg/dl — ABNORMAL HIGH (ref 70–140)
Potassium: 4 mEq/L (ref 3.5–5.1)
Sodium: 139 mEq/L (ref 136–145)
Total Bilirubin: 0.23 mg/dL (ref 0.20–1.20)
Total Protein: 7.5 g/dL (ref 6.4–8.3)

## 2013-11-27 LAB — CBC WITH DIFFERENTIAL/PLATELET
BASO%: 0 % (ref 0.0–2.0)
Basophils Absolute: 0 10*3/uL (ref 0.0–0.1)
EOS%: 0 % (ref 0.0–7.0)
Eosinophils Absolute: 0 10*3/uL (ref 0.0–0.5)
HCT: 34.4 % — ABNORMAL LOW (ref 34.8–46.6)
HGB: 11.4 g/dL — ABNORMAL LOW (ref 11.6–15.9)
LYMPH%: 12 % — ABNORMAL LOW (ref 14.0–49.7)
MCH: 31.8 pg (ref 25.1–34.0)
MCHC: 33.1 g/dL (ref 31.5–36.0)
MCV: 96.1 fL (ref 79.5–101.0)
MONO#: 0 10*3/uL — ABNORMAL LOW (ref 0.1–0.9)
MONO%: 0.6 % (ref 0.0–14.0)
NEUT#: 3.1 10*3/uL (ref 1.5–6.5)
NEUT%: 87.4 % — ABNORMAL HIGH (ref 38.4–76.8)
Platelets: 205 10*3/uL (ref 145–400)
RBC: 3.58 10*6/uL — ABNORMAL LOW (ref 3.70–5.45)
RDW: 12.9 % (ref 11.2–14.5)
WBC: 3.6 10*3/uL — ABNORMAL LOW (ref 3.9–10.3)
lymph#: 0.4 10*3/uL — ABNORMAL LOW (ref 0.9–3.3)

## 2013-11-27 MED ORDER — DEXAMETHASONE SODIUM PHOSPHATE 20 MG/5ML IJ SOLN
20.0000 mg | Freq: Once | INTRAMUSCULAR | Status: AC
  Administered 2013-11-27: 20 mg via INTRAVENOUS

## 2013-11-27 MED ORDER — DEXAMETHASONE SODIUM PHOSPHATE 20 MG/5ML IJ SOLN
INTRAMUSCULAR | Status: AC
  Filled 2013-11-27: qty 5

## 2013-11-27 MED ORDER — LORAZEPAM 1 MG PO TABS
ORAL_TABLET | ORAL | Status: AC
  Filled 2013-11-27: qty 1

## 2013-11-27 MED ORDER — FAMOTIDINE IN NACL 20-0.9 MG/50ML-% IV SOLN
20.0000 mg | Freq: Once | INTRAVENOUS | Status: AC
  Administered 2013-11-27: 20 mg via INTRAVENOUS

## 2013-11-27 MED ORDER — SODIUM CHLORIDE 0.9 % IV SOLN
322.0000 mg | Freq: Once | INTRAVENOUS | Status: AC
  Administered 2013-11-27: 320 mg via INTRAVENOUS
  Filled 2013-11-27: qty 32

## 2013-11-27 MED ORDER — ONDANSETRON 16 MG/50ML IVPB (CHCC)
16.0000 mg | Freq: Once | INTRAVENOUS | Status: AC
  Administered 2013-11-27: 16 mg via INTRAVENOUS

## 2013-11-27 MED ORDER — FAMOTIDINE IN NACL 20-0.9 MG/50ML-% IV SOLN
INTRAVENOUS | Status: AC
  Filled 2013-11-27: qty 50

## 2013-11-27 MED ORDER — LORAZEPAM 2 MG/ML IJ SOLN
INTRAMUSCULAR | Status: AC
  Filled 2013-11-27: qty 1

## 2013-11-27 MED ORDER — SODIUM CHLORIDE 0.9 % IV SOLN
Freq: Once | INTRAVENOUS | Status: AC
  Administered 2013-11-27: 12:00:00 via INTRAVENOUS

## 2013-11-27 MED ORDER — PACLITAXEL CHEMO INJECTION 300 MG/50ML
175.0000 mg/m2 | Freq: Once | INTRAVENOUS | Status: AC
  Administered 2013-11-27: 252 mg via INTRAVENOUS
  Filled 2013-11-27: qty 42

## 2013-11-27 MED ORDER — DIPHENHYDRAMINE HCL 50 MG/ML IJ SOLN
12.5000 mg | Freq: Once | INTRAMUSCULAR | Status: AC
  Administered 2013-11-27: 12.5 mg via INTRAVENOUS

## 2013-11-27 MED ORDER — DIPHENHYDRAMINE HCL 50 MG/ML IJ SOLN
INTRAMUSCULAR | Status: AC
  Filled 2013-11-27: qty 1

## 2013-11-27 MED ORDER — LORAZEPAM 1 MG PO TABS
0.5000 mg | ORAL_TABLET | Freq: Once | ORAL | Status: AC | PRN
  Administered 2013-11-27: 0.5 mg via ORAL

## 2013-11-27 MED ORDER — ONDANSETRON 16 MG/50ML IVPB (CHCC)
INTRAVENOUS | Status: AC
  Filled 2013-11-27: qty 16

## 2013-11-28 NOTE — Progress Notes (Signed)
GYN Location of Tumor / Histology: Endometrial carcinoma   Patient presented in February 2015 with some vaginal spotting . Originally though it was a UTI.   Biopsies revealed:   09/17/13  Diagnosis  Endometrium, biopsy, uterus, lining  - POSITIVE FOR ENDOMETRIAL ADENOCARCINOMA.   Past/Anticipated interventions by Gyn/Onc surgery, if any: Robotic-assisted total laparoscopic hysterectomy, bilateral salpingo-oophorectomy, bilateral pelvic and para-aortic lymphadenectomy with sentinel lymph node dissection on 10/03/2013 by Dr. Clarene Essex.   Past/Anticipated interventions by medical oncology, if any: Per Dr. Marko Plume - first taxol carbo on 11-06-13. Plan HDR beginning during cycle 2. She will have cycle 2 on 11-27-13 as long as ANC >=1.5 and plt >=100k;  Weight changes, if any: no   Bowel/Bladder complaints, if any: reports urinate - gets up every hour at night- is drinking more water   Nausea/Vomiting, if any: no   Pain issues, if any: Occasional aching in lower abdomen from surgery.   SAFETY ISSUES: no  Prior radiation? no  Pacemaker/ICD? no  Possible current pregnancy? no  Is the patient on methotrexate? No  Current Complaints / other details:

## 2013-11-30 ENCOUNTER — Other Ambulatory Visit: Payer: Self-pay | Admitting: Oncology

## 2013-11-30 DIAGNOSIS — C541 Malignant neoplasm of endometrium: Secondary | ICD-10-CM

## 2013-12-01 ENCOUNTER — Telehealth: Payer: Self-pay | Admitting: *Deleted

## 2013-12-01 NOTE — Telephone Encounter (Signed)
Called patient to remind of HDR Case for 12-02-13, spoke with patient and she is aware of this appt.

## 2013-12-02 ENCOUNTER — Telehealth: Payer: Self-pay

## 2013-12-02 ENCOUNTER — Ambulatory Visit: Payer: Medicare Other | Admitting: Radiation Oncology

## 2013-12-02 ENCOUNTER — Encounter: Payer: Self-pay | Admitting: Radiation Oncology

## 2013-12-02 ENCOUNTER — Ambulatory Visit
Admission: RE | Admit: 2013-12-02 | Discharge: 2013-12-02 | Disposition: A | Discharge status: Home / Self Care | Payer: Medicare Other | Source: Ambulatory Visit | Attending: Radiation Oncology | Admitting: Radiation Oncology

## 2013-12-02 VITALS — BP 108/49 | HR 82 | Temp 98.8°F | Resp 20 | Wt 105.6 lb

## 2013-12-02 DIAGNOSIS — C541 Malignant neoplasm of endometrium: Secondary | ICD-10-CM

## 2013-12-02 DIAGNOSIS — M25469 Effusion, unspecified knee: Secondary | ICD-10-CM | POA: Insufficient documentation

## 2013-12-02 DIAGNOSIS — C549 Malignant neoplasm of corpus uteri, unspecified: Secondary | ICD-10-CM

## 2013-12-02 DIAGNOSIS — Z79899 Other long term (current) drug therapy: Secondary | ICD-10-CM | POA: Diagnosis not present

## 2013-12-02 MED ORDER — ONDANSETRON HCL 8 MG PO TABS: ORAL_TABLET | ORAL | Status: DC

## 2013-12-02 NOTE — Progress Notes (Signed)
Please see the Nurse Progress Note in the MD Initial Consult Encounter for this patient. 

## 2013-12-02 NOTE — Progress Notes (Signed)
Radiation Oncology         [phone]) [phone] ________________________________  Name: Veronica Ward MRN: [id number]  Date: 12/02/2013  DOB: May 20, 1946  Reevaluation  Note  CC: [name], MD  Marti Sleigh *  Diagnosis:   Endometrial cancer, FIGO grade 3, serous carcinoma  Primary site: Corpus Uteri - Carcinoma  Staging method: AJCC 7th Edition  Clinical: Stage IA (T1a, N0, M0)  Pathologic: Stage IA (T1a, N0, M0)  Summary: Stage IA (T1a, N0, M0)     Narrative:  The patient returns today for examination and planning for her first high-dose-rate radiation treatment directed at the proximal vagina.  She has recently completed her second cycle of chemotherapy. Overall she is tolerated well. She has noticed some swelling in her right knee with her second cycle and will discuss with medical oncology.                             ALLERGIES:  is allergic to bactrim; oxycodone-acetaminophen; and sulfa antibiotics.  Meds: Current Outpatient Prescriptions  Medication Sig Dispense Refill  . Acetaminophen 500 MG coapsule Take by mouth.      . Ascorbic Acid (VITAMIN C) 100 MG tablet Take 100 mg by mouth daily.      . Biotin 1 MG CAPS Take 1 tablet by mouth daily.      . cholecalciferol (VITAMIN D) 400 UNITS TABS Take by mouth.      . co-enzyme Q-10 30 MG capsule Take 30 mg by mouth 3 (three) times daily.      Marland Kitchen dexamethasone (DECADRON) 4 MG tablet Take 5 tablets (=20mg ) with food 12 hours and 6 hours prior to taxol  20 tablet  1  . fish oil-omega-3 fatty acids 1000 MG capsule Take by mouth 2 (two) times daily.       . FOLBIC 2.5-25-2 MG TABS TAKE 1 TABLET BY MOUTH DAILY  30 each  3  . ibuprofen (ADVIL,MOTRIN) 200 MG tablet Take 200 mg by mouth as needed for pain.      Marland Kitchen LORazepam (ATIVAN) 1 MG tablet 1/2 to 1 tablet  Under tongue or by mouth every 6 hours as needed for nausea. Also take at bedtime night of first chemo whether or not any nausea.  20 tablet  0  . senna-docusate (SENOKOT S)  8.6-50 MG per tablet Take 2 tablets by mouth at bedtime.       . docusate sodium (COLACE) 100 MG capsule Take 100 mg by mouth.      Marland Kitchen HYDROcodone-acetaminophen (VICODIN) 5-500 MG per tablet Take 1 tablet by mouth every 6 (six) hours as needed for pain.      Marland Kitchen loratadine (CLARITIN) 10 MG tablet Take 10 mg by mouth daily as needed for allergies.      . magnesium 30 MG tablet Take 30 mg by mouth daily.      . Multiple Vitamin (MULTIVITAMIN) tablet Take 1 tablet by mouth daily.      . ondansetron (ZOFRAN) 8 MG tablet 1-2 tablets every 12 hours as needed for nausea. Will not make drowsy. Also take 1 tablet AM after chemo whether or not any nausea  30 tablet  1  . polyethylene glycol (MIRALAX / GLYCOLAX) packet Take 17 g by mouth daily as needed.       No current facility-administered medications for this encounter.    Physical Findings: The patient is in no acute distress. Patient is alert and oriented.  weight is 105 lb 9.6 oz (47.9 kg). Her oral temperature is 98.8 F (37.1 C). Her blood pressure is 108/49 and her pulse is 82. Her respiration is 20. .  The abdomen is soft and nontender with normal bowel sounds. No inguinal adenopathy is appreciated. On pelvic examination external genitalia are unremarkable. A speculum exam is performed. There is granulation tissue noted at the vaginal cuff region. On bimanual examination there no pelvic masses appreciated.  Lab Findings: Lab Results  Component Value Date   WBC 3.6* 11/27/2013   HGB 11.4* 11/27/2013   HCT 34.4* 11/27/2013   MCV 96.1 11/27/2013   PLT 205 11/27/2013      Radiographic Findings: No results found.  Impression:  Endometrial cancer -serous carcinoma.  The vaginal cuff may not be completely healed. I would like gynecologic oncology to examine the patient prior to proceeding with the patient's first high-dose-rate treatment.          Plan:  Patient will be seen by Dr. Nancy Marus on may 14 for evaluation and  examination.  ____________________________________ Blair Promise, MD

## 2013-12-02 NOTE — Telephone Encounter (Signed)
Ms. Feigenbaum a little upset and disappointed that her radiation treatment has to be delayed due to granulation tissue formation that needs to be removed.  Her right knee is more swollen and uncomfortable. The right calf is soft, not tender, or red.  She bears weight on leg with out pain in calf.  The right knee is her "bad" knee.  She did not sleep well last night due to discomfort.  The 0.5 mg of ativan was not effective. She could not get comfortable with the right knee in bed. She has been using aleve 1-2 tabs for knee.  She states that she gets more benefit from Ibuprofen.  Suggested that she takes 2 ibuprofen when she gets home with food and repeat this evening.Take a warm bath which she finds soothing and relaxing.  Take 1/2 of the Vicodin and see if she sleeps better.   She needs a refill on her Zofran which was sent to her pharmacy. Told her that  Joylene John NP. was going to give her an appointment tomorrow at Dr. Mariana Kaufman visit for her to see a [facility] Oncologist later this week to remove the granulation tissue.   Ms. Hilbert Corrigan is comfortable with plan.

## 2013-12-03 ENCOUNTER — Ambulatory Visit (HOSPITAL_BASED_OUTPATIENT_CLINIC_OR_DEPARTMENT_OTHER): Payer: Medicare Other | Admitting: Oncology

## 2013-12-03 ENCOUNTER — Other Ambulatory Visit (HOSPITAL_BASED_OUTPATIENT_CLINIC_OR_DEPARTMENT_OTHER): Payer: Medicare Other

## 2013-12-03 ENCOUNTER — Encounter: Payer: Self-pay | Admitting: Oncology

## 2013-12-03 ENCOUNTER — Telehealth: Payer: Self-pay | Admitting: *Deleted

## 2013-12-03 VITALS — BP 112/62 | HR 87 | Temp 98.9°F | Resp 18 | Ht 63.0 in | Wt 105.8 lb

## 2013-12-03 DIAGNOSIS — Z8041 Family history of malignant neoplasm of ovary: Secondary | ICD-10-CM | POA: Diagnosis not present

## 2013-12-03 DIAGNOSIS — C541 Malignant neoplasm of endometrium: Secondary | ICD-10-CM

## 2013-12-03 DIAGNOSIS — C549 Malignant neoplasm of corpus uteri, unspecified: Secondary | ICD-10-CM

## 2013-12-03 DIAGNOSIS — M810 Age-related osteoporosis without current pathological fracture: Secondary | ICD-10-CM | POA: Diagnosis not present

## 2013-12-03 DIAGNOSIS — Z803 Family history of malignant neoplasm of breast: Secondary | ICD-10-CM | POA: Diagnosis not present

## 2013-12-03 LAB — CBC WITH DIFFERENTIAL/PLATELET
BASO%: 1.5 % (ref 0.0–2.0)
Basophils Absolute: 0.1 10*3/uL (ref 0.0–0.1)
EOS%: 5.8 % (ref 0.0–7.0)
Eosinophils Absolute: 0.2 10*3/uL (ref 0.0–0.5)
HCT: 31.9 % — ABNORMAL LOW (ref 34.8–46.6)
HGB: 10.5 g/dL — ABNORMAL LOW (ref 11.6–15.9)
LYMPH%: 28.5 % (ref 14.0–49.7)
MCH: 32.2 pg (ref 25.1–34.0)
MCHC: 33.1 g/dL (ref 31.5–36.0)
MCV: 97.2 fL (ref 79.5–101.0)
MONO#: 0.1 10*3/uL (ref 0.1–0.9)
MONO%: 3.5 % (ref 0.0–14.0)
NEUT#: 2.3 10*3/uL (ref 1.5–6.5)
NEUT%: 60.7 % (ref 38.4–76.8)
Platelets: 203 10*3/uL (ref 145–400)
RBC: 3.28 10*6/uL — ABNORMAL LOW (ref 3.70–5.45)
RDW: 12.7 % (ref 11.2–14.5)
WBC: 3.8 10*3/uL — ABNORMAL LOW (ref 3.9–10.3)
lymph#: 1.1 10*3/uL (ref 0.9–3.3)

## 2013-12-03 NOTE — Telephone Encounter (Signed)
Per staff message and POF I have scheduled appts. No available for treatment on 5/28 moved to 5/29 advised scheduler  JMW

## 2013-12-03 NOTE — Progress Notes (Signed)
OFFICE PROGRESS NOTE   12/03/2013   Physicians:Soper, John,Tower, [place]; Constant, Peggy; Kinard, Jeneen Rinks; Laurann Montana, [facility]); Mellody Drown (gyn onc [facility]);Kaplan, Carman Ching, Josph Macho   INTERVAL HISTORY:  Patient is seen, alone for visit, in continuing attention to adjuvant chemotherapy in process for high grade serous T1aN0 endometrial carcinoma, cycle 2 taxol carboplatin given 11-27-13. Nadir ANC was 1.8 at day 9 cycle 1 (2 days of neupogen then), and we will recheck next week rather than giving gCSF if not needed.  She saw Dr Sondra Come yesterday, HDR held until gyn oncology sees tomorrow to confirm adequacy of vaginal healing. Patient has felt better overall last several days, with exception of flare of chronic right knee pain and fatigue after activity. She was too tired to attempt yoga this week. She has some lower pelvic pressure without frank bladder symptoms. She has no significant neuropathy. Appetite has been good and bowels are moving.  She does not have [place] Patient had been in usual good health until she noticed vaginal spotting in Feb 2015. She was seen by PCP Dr Glori Bickers, with endometrial biopsy 09-17-13 by Dr Elly Modena [phone]) endometrial adenocarcinoma, which appeared to be grade II. She had pelvic and transvaginal US 09-23-13. She was seen in consultation by Dr Josephina Shih, then referred to Dr Clarene Essex at [facility] for earliest surgical availability. She had preoperative CXR at [facility]. Surgery 10-03-13 was robotic assisted total laparoscopic hysterectomy/ BSO/ bilateral pelvic and para-aortic nodes with sentinel node evaluation, tolerated well and patient discharged home on POD #1. [facility] surgical pathology [id number]) showed serous carcinoma FIGO grade 3 without myometrial invasion, no adnexal, serosal or cervical involvement, no LVSI, 0/27 nodes involved (9 para-aortic, 18 pelvic), pT1a N0. She saw Dr Clarene Essex in follow up on 10-05-13, with recommendation for 6 cycles of adjuvant  carboplatin and taxol chemotherapy and brachytherapy; she also had consultation with radiation oncology at [facility]. Chemotherapy education was done at [facility] 10-16-13. She had second opinion consultation by Dr Fransisca Connors at [facility]. First taxol carboplatin was given 11-06-13. Brachytherapy is planned concomitant with chemotherapy.    Review of systems as above, also: No fever or symptoms of infection. No bleeding. No SOB at rest. Remainder of 10 point Review of Systems negative.  Objective:  Vital signs in last 24 hours:  BP 112/62  Pulse 87  Temp(Src) 98.9 F (37.2 C) (Oral)  Resp 18  Ht 5\' 3"  (1.6 m)  Wt 105 lb 12.8 oz (47.991 kg)  BMI 18.75 kg/m2  SpO2 98%  LMP 09/17/2000 Weight is up 2 lbs. Alert, oriented and appropriate, talkative, in good spirits. Ambulatory without difficulty.  Alopecia  HEENT:PERRL, sclerae not icteric. Oral mucosa moist without lesions, posterior pharynx clear.  Neck supple. No JVD.  Lymphatics:no cervical,suraclavicular or inguinal adenopathy Resp: clear to auscultation bilaterally and normal percussion bilaterally Cardio: regular rate and rhythm. No gallop. GI: soft, nontender, not distended, no mass or organomegaly. Normally active bowel sounds. Surgical incision not remarkable. Musculoskeletal/ Extremities: without pitting edema, cords, tenderness Neuro: no peripheral neuropathy. Otherwise nonfocal Skin without rash, ecchymosis, petechiae   Lab Results:  Results for orders placed in visit on 12/03/13  CBC WITH DIFFERENTIAL      Result Value Ref Range   WBC 3.8 (*) 3.9 - 10.3 10e3/uL   NEUT# 2.3  1.5 - 6.5 10e3/uL   HGB 10.5 (*) 11.6 - 15.9 g/dL   HCT 31.9 (*) 34.8 - 46.6 %   Platelets 203  145 - 400 10e3/uL  MCV 97.2  79.5 - 101.0 fL   MCH 32.2  25.1 - 34.0 pg   MCHC 33.1  31.5 - 36.0 g/dL   RBC 3.28 (*) 3.70 - 5.45 10e6/uL   RDW 12.7  11.2 - 14.5 %   lymph# 1.1  0.9 - 3.3 10e3/uL   MONO# 0.1  0.1 - 0.9 10e3/uL    Eosinophils Absolute 0.2  0.0 - 0.5 10e3/uL   Basophils Absolute 0.1  0.0 - 0.1 10e3/uL   NEUT% 60.7  38.4 - 76.8 %   LYMPH% 28.5  14.0 - 49.7 %   MONO% 3.5  0.0 - 14.0 %   EOS% 5.8  0.0 - 7.0 %   BASO% 1.5  0.0 - 2.0 %     Studies/Results:  No results found.  Medications: I have reviewed the patient's current medications. Refill zofran  DISCUSSION: will repeat CBC on 5-19, coordinating with radiation oncology appointment that day.If ANC < 1.2 would give neupogen 300 x 2 days then.  Assessment/Plan: 1.IA high grade serous endometrial carcinoma: post TAH/BSO/ pelvic and para-aortic node evaluation at [facility] 10-03-13 and first taxol carbo on 11-06-13. Counts still ok day 7 cycle 2 today, see above re gCSF. Exam by gyn oncology tomorrow, then proceed with HDR if appropriate. Due cycle 3 12-19-13. 2.family history of breast and ovarian cancer, Ashkenazi Jewish descent. Genetics counseling at [facility] 12-10-13.  3.osteoporosis: ok to hold Fosamax if nausea from chemo, which hopefully will be only intermittently if so  4.minimal psoriasis: stable  5.possible allergic reaction to sulfa, which we will avoid. 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  7.internal hemorrhoids at recent colonoscopy. Prn sitz baths if slight bleeding continues    Patient is in agreement with all of plan above. I will see her with labs including chemistries on 5-27 prior to cycle 3 on 12-19-13.    Gordy Levan, MD   12/03/2013, 3:47 PM

## 2013-12-04 ENCOUNTER — Encounter: Payer: Self-pay | Admitting: Family Medicine

## 2013-12-05 ENCOUNTER — Encounter: Payer: Self-pay | Admitting: Gynecology

## 2013-12-05 ENCOUNTER — Ambulatory Visit: Payer: Medicare Other | Attending: Gynecology | Admitting: Gynecology

## 2013-12-05 VITALS — BP 110/44 | HR 71 | Temp 98.7°F | Resp 18 | Ht 63.0 in | Wt 103.8 lb

## 2013-12-05 DIAGNOSIS — C541 Malignant neoplasm of endometrium: Secondary | ICD-10-CM

## 2013-12-05 DIAGNOSIS — M899 Disorder of bone, unspecified: Secondary | ICD-10-CM | POA: Diagnosis not present

## 2013-12-05 DIAGNOSIS — Z9221 Personal history of antineoplastic chemotherapy: Secondary | ICD-10-CM | POA: Insufficient documentation

## 2013-12-05 DIAGNOSIS — Z90710 Acquired absence of both cervix and uterus: Secondary | ICD-10-CM | POA: Insufficient documentation

## 2013-12-05 DIAGNOSIS — F411 Generalized anxiety disorder: Secondary | ICD-10-CM | POA: Insufficient documentation

## 2013-12-05 DIAGNOSIS — Z79899 Other long term (current) drug therapy: Secondary | ICD-10-CM | POA: Diagnosis not present

## 2013-12-05 DIAGNOSIS — M949 Disorder of cartilage, unspecified: Secondary | ICD-10-CM | POA: Diagnosis not present

## 2013-12-05 DIAGNOSIS — Z87891 Personal history of nicotine dependence: Secondary | ICD-10-CM | POA: Insufficient documentation

## 2013-12-05 DIAGNOSIS — C549 Malignant neoplasm of corpus uteri, unspecified: Secondary | ICD-10-CM | POA: Diagnosis not present

## 2013-12-05 NOTE — Progress Notes (Signed)
Consult Note: Gyn-Onc   Veronica Ward 68 y.o. female  Chief Complaint  Patient presents with  . Endometrial Cancer    Follow up    Assessment : Endometrial carcinoma (papillary serous) stage I a  Plan: The patient will continue plan to chemotherapy with carboplatin and Taxol.Marland Kitchen Recommend she received a total of 6 cycles. She is also scheduled to begin the vaginal vault brachytherapy this week.  Plan on seeing the patient back following completion of her chemotherapy.  Interval history: Patient underwent robotic hysterectomy bilateral salpingo-oophorectomy and pelvic lymphadenectomy at [facility] on 10/03/2013. 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 Adjuvant therapy with carboplatin and Taxol and vaginal vault brachytherapy was advised. The patient had a complicated postoperative course. She is seen today to evaluate her vaginal cuff prior to instituting radiation therapy. Patient denies any vaginal bleeding. She is having some constipation. She does have intermittent lower nominal pain. She has minimal nausea with no vomiting. She is having some difficulty with insomnia.  HPI: 68 year old white female seen in consultation request of Dr. Mora Bellman regarding management of a newly diagnosed endometrial cancer. Approximately 3 weeks ago the patient developed some vaginal spotting. She was seen by Dr. Elly Modena on February 25. An endometrial biopsy was obtained showing a grade 2 endometrial carcinoma. The patient is also had an ultrasound of the pelvis revealing a uterus measuring 7 x 3 x 3.7 cm. The endometrial stripe was 7 mm.  [name] She has no other gynecologic history.  Review of Systems:10 point review of systems is negative except as noted in interval history.   Vitals: Blood pressure  110/44, pulse 71, temperature 98.7 F (37.1 C), temperature source Oral, resp. rate 18, height 5' 3" (1.6 m), weight 103 lb 12.8 oz (47.083 kg), last menstrual period 09/17/2000.  Physical Exam: General : The patient is a healthy woman in no acute distress.  HEENT: normocephalic, extraoccular movements normal; neck is supple without thyromegally  Lynphnodes: Supraclavicular and inguinal nodes not enlarged  Abdomen: Soft, non-tender, no ascites, no organomegally, no masses, no hernias  Pelvic:  EGBUS: Normal female  Vagina: Normal, no lesions, atrophic , the cuff closure is intact and is healing appropriately. Urethra and Bladder: Normal, non-tender  Cervix: Surgically absent Uterus: Surgically absent Bi-manual examination: Non-tender; no adenxal masses or nodularity  Rectal: normal sphincter tone, no masses, no blood  Lower extremities: No edema or varicosities. Normal range of motion      Allergies  Allergen Reactions  . Bactrim [Sulfamethoxazole-Trimethoprim] Rash  . Oxycodone-Acetaminophen Anxiety  . Sulfa Antibiotics Rash    Past Medical History  Diagnosis Date  . Hx of basal cell carcinoma     face  . Diffuse cystic mastopathy   . Headache(784.0)   . Unspecified hemorrhoids without mention of complication   . Other malaise and fatigue   . Disorder of bone and cartilage, unspecified   . Other psoriasis   . Other seborrheic keratosis   . Sleep disturbance, unspecified   . Predominant disturbance of emotions   . Pain in limb   . Urticaria, unspecified   . History of shingles   . Anxiety   . Anemia   . Dysautonomia     mild  . Osteoporosis   . Endometrial cancer     Past  Surgical History  Procedure Laterality Date  . Cervical conization w/bx    . Cervical cancer  1973  . Breast biopsy Left 2001    benign  . Tubal ligation    . Dexa  03/2002    oseopenia  . Colonoscopy  12/04    int. hemorrhoids  . Dexa  07/2004    decreased BMD, osteopenia  . Dexa   02/2007    Osteopenia  . Breast lumpectomy      left underarm  . Birthmark removal  childhood  . Dexa  9/10    Osteopenia slightly worse  . Felon i&d  12/2010    Dr. Fredna Dow, I&D in OR (L index finger)  . Finger surgery    . Knee surgery      right  . Robotic assisted total hysterectomy with bilateral salpingo oopherectomy  10/03/2013    Robotic-assisted total laparoscopic hysterectomy, bilateral salpingo-oophorectomy, bilateral pelvic and para-aortic lymphadenectomy with sentinel lymph node dissection  . Tonsilectomy, adenoidectomy, bilateral myringotomy and tubes    . Axillary lymph node dissection      left    Current Outpatient Prescriptions  Medication Sig Dispense Refill  . Acetaminophen 500 MG coapsule Take by mouth.      . Ascorbic Acid (VITAMIN C) 100 MG tablet Take 100 mg by mouth daily.      . Biotin 1 MG CAPS Take 1 tablet by mouth daily.      . cholecalciferol (VITAMIN D) 400 UNITS TABS Take by mouth.      . co-enzyme Q-10 30 MG capsule Take 30 mg by mouth 3 (three) times daily.      Marland Kitchen dexamethasone (DECADRON) 4 MG tablet Take 5 tablets (=34m) with food 12 hours and 6 hours prior to taxol  20 tablet  1  . fish oil-omega-3 fatty acids 1000 MG capsule Take by mouth 2 (two) times daily.       . FOLBIC 2.5-25-2 MG TABS TAKE 1 TABLET BY MOUTH DAILY  30 each  3  . HYDROcodone-acetaminophen (VICODIN) 5-500 MG per tablet Take 1 tablet by mouth every 6 (six) hours as needed for pain.      .Marland Kitchenibuprofen (ADVIL,MOTRIN) 200 MG tablet Take 200 mg by mouth as needed for pain.      .Marland Kitchenloratadine (CLARITIN) 10 MG tablet Take 10 mg by mouth daily as needed for allergies.      .Marland KitchenLORazepam (ATIVAN) 1 MG tablet 1/2 to 1 tablet  Under tongue or by mouth every 6 hours as needed for nausea. Also take at bedtime night of first chemo whether or not any nausea.  20 tablet  0  . magnesium 30 MG tablet Take 30 mg by mouth daily.      . Multiple Vitamin (MULTIVITAMIN) tablet Take 1 tablet by mouth daily.       . ondansetron (ZOFRAN) 8 MG tablet 1-2 tablets every 12 hours as needed for nausea. Will not make drowsy. Also take 1 tablet AM after chemo whether or not any nausea  30 tablet  1  . polyethylene glycol (MIRALAX / GLYCOLAX) packet Take 17 g by mouth daily as needed.      . senna-docusate (SENOKOT S) 8.6-50 MG per tablet Take 2 tablets by mouth at bedtime.        No current facility-administered medications for this visit.    History   Social History  . Marital Status: Married    Spouse Name: N/A    Number of Children:  3  . Years of Education: N/A   Occupational History  . Professor    Social History Main Topics  . Smoking status: Former Smoker    Quit date: 07/25/1963  . Smokeless tobacco: Never Used  . Alcohol Use: 0.0 oz/week    0 drink(s) per week     Comment: 1/2 glass of wine a day  . Drug Use: No  . Sexual Activity: Yes    Birth Control/ Protection: Post-menopausal   Other Topics Concern  . Not on file   Social History Narrative   History professor      Likes to dance      Is a walker and does floor exercises      Caffeine: 2 cups daily    Family History  Problem Relation Age of Onset  . Multiple myeloma Father   . Coronary artery disease Father   . Transient ischemic attack Mother   . Lupus Maternal Aunt   . Prostate cancer Maternal Uncle   . Cancer Maternal Grandfather   . Multiple sclerosis Brother   . Breast cancer      Aunt x2  . Colon cancer Neg Hx   . Ovarian cancer Maternal Gann, MD 12/05/2013, 1:23 PM

## 2013-12-05 NOTE — Patient Instructions (Signed)
Follow up after you have completed Chemotherapy.

## 2013-12-08 ENCOUNTER — Telehealth: Payer: Self-pay | Admitting: *Deleted

## 2013-12-08 ENCOUNTER — Telehealth: Payer: Self-pay | Admitting: Oncology

## 2013-12-08 NOTE — Telephone Encounter (Signed)
CALLED PATIENT TO REMIND TO BE HERE ON 12-09-13 @ 9 AM, SPOKE WITH PATIENT AND SHE IS AWARE OF THESE APPTS.

## 2013-12-08 NOTE — Telephone Encounter (Signed)
Called Veronica Ward about her appointment time tomorrow.  Advised her that I would call her down to see Dr. Sondra Come after her lab appointment at 9:15.  Quinteria verbalized agreement.

## 2013-12-08 NOTE — Progress Notes (Signed)
Patient presented in February 2015 with some vaginal spotting . Originally though it was a UTI.   Biopsies revealed:   09/17/13  Diagnosis  Endometrium, biopsy, uterus, lining  - POSITIVE FOR ENDOMETRIAL ADENOCARCINOMA.   Past/Anticipated interventions by Gyn/Onc surgery, if any: Robotic-assisted total laparoscopic hysterectomy, bilateral salpingo-oophorectomy, bilateral pelvic and para-aortic lymphadenectomy with sentinel lymph node dissection on 10/03/2013 by Dr. Clarene Essex.   Past/Anticipated interventions by medical oncology, if any: Per Dr. Marko Plume - first taxol carbo on 11-06-13. Plan HDR beginning during cycle 2. She will have cycle 2 on 11-27-13 as long as [place] >=1.5 and plt >=100k; next chemotherapy next Thursday.  Weight changes, if any: no   Bowel/Bladder complaints, if any: reports urinate - gets up every hour at night- is drinking more water   Nausea/Vomiting, if any: had nausea last week from chemotherapy.  Pain issues, if any: no   SAFETY ISSUES: no  Prior radiation? no  Pacemaker/ICD? no  Possible current pregnancy? no  Is the patient on methotrexate? No  Current Complaints / other details:  Has pain under her tongue from a sore.

## 2013-12-09 ENCOUNTER — Other Ambulatory Visit (HOSPITAL_BASED_OUTPATIENT_CLINIC_OR_DEPARTMENT_OTHER): Payer: Medicare Other

## 2013-12-09 ENCOUNTER — Ambulatory Visit (HOSPITAL_BASED_OUTPATIENT_CLINIC_OR_DEPARTMENT_OTHER): Payer: Medicare Other

## 2013-12-09 ENCOUNTER — Other Ambulatory Visit: Payer: Self-pay | Admitting: Oncology

## 2013-12-09 ENCOUNTER — Encounter: Payer: Self-pay | Admitting: Radiation Oncology

## 2013-12-09 ENCOUNTER — Telehealth: Payer: Self-pay | Admitting: Oncology

## 2013-12-09 ENCOUNTER — Ambulatory Visit
Admission: RE | Admit: 2013-12-09 | Discharge: 2013-12-09 | Disposition: A | Discharge status: Home / Self Care | Payer: Medicare Other | Source: Ambulatory Visit | Attending: Radiation Oncology | Admitting: Radiation Oncology

## 2013-12-09 ENCOUNTER — Other Ambulatory Visit: Payer: Self-pay

## 2013-12-09 VITALS — BP 120/75 | HR 66 | Temp 98.3°F | Ht 63.0 in | Wt 104.1 lb

## 2013-12-09 DIAGNOSIS — Z510 Encounter for antineoplastic radiation therapy: Secondary | ICD-10-CM | POA: Diagnosis not present

## 2013-12-09 DIAGNOSIS — C541 Malignant neoplasm of endometrium: Secondary | ICD-10-CM

## 2013-12-09 DIAGNOSIS — Z9079 Acquired absence of other genital organ(s): Secondary | ICD-10-CM | POA: Diagnosis not present

## 2013-12-09 DIAGNOSIS — Z803 Family history of malignant neoplasm of breast: Secondary | ICD-10-CM | POA: Diagnosis not present

## 2013-12-09 DIAGNOSIS — C549 Malignant neoplasm of corpus uteri, unspecified: Secondary | ICD-10-CM | POA: Diagnosis not present

## 2013-12-09 DIAGNOSIS — D709 Neutropenia, unspecified: Secondary | ICD-10-CM

## 2013-12-09 DIAGNOSIS — Z90710 Acquired absence of both cervix and uterus: Secondary | ICD-10-CM | POA: Diagnosis not present

## 2013-12-09 DIAGNOSIS — M810 Age-related osteoporosis without current pathological fracture: Secondary | ICD-10-CM | POA: Diagnosis not present

## 2013-12-09 LAB — CBC WITH DIFFERENTIAL/PLATELET
BASO%: 1.1 % (ref 0.0–2.0)
Basophils Absolute: 0 10*3/uL (ref 0.0–0.1)
EOS%: 6.4 % (ref 0.0–7.0)
Eosinophils Absolute: 0.1 10*3/uL (ref 0.0–0.5)
HCT: 31.7 % — ABNORMAL LOW (ref 34.8–46.6)
HGB: 10.6 g/dL — ABNORMAL LOW (ref 11.6–15.9)
LYMPH%: 35.3 % (ref 14.0–49.7)
MCH: 32.6 pg (ref 25.1–34.0)
MCHC: 33.5 g/dL (ref 31.5–36.0)
MCV: 97.4 fL (ref 79.5–101.0)
MONO#: 0.4 10*3/uL (ref 0.1–0.9)
MONO%: 20.1 % — ABNORMAL HIGH (ref 0.0–14.0)
NEUT#: 0.7 10*3/uL — ABNORMAL LOW (ref 1.5–6.5)
NEUT%: 37.1 % — ABNORMAL LOW (ref 38.4–76.8)
Platelets: 160 10*3/uL (ref 145–400)
RBC: 3.26 10*6/uL — ABNORMAL LOW (ref 3.70–5.45)
RDW: 13.2 % (ref 11.2–14.5)
WBC: 2 10*3/uL — ABNORMAL LOW (ref 3.9–10.3)
lymph#: 0.7 10*3/uL — ABNORMAL LOW (ref 0.9–3.3)

## 2013-12-09 MED ORDER — FILGRASTIM 300 MCG/0.5ML IJ SOLN: 300.0000 ug | Freq: Once | INTRAMUSCULAR | Status: DC

## 2013-12-09 MED ORDER — TBO-FILGRASTIM 300 MCG/0.5ML ~~LOC~~ SOSY
300.0000 ug | PREFILLED_SYRINGE | Freq: Once | SUBCUTANEOUS | Status: AC
  Administered 2013-12-09: 300 ug via SUBCUTANEOUS
  Filled 2013-12-09: qty 0.5

## 2013-12-09 MED ORDER — FILGRASTIM 300 MCG/0.5ML IJ SOLN
300.0000 ug | Freq: Once | INTRAMUSCULAR | Status: DC
  Filled 2013-12-09: qty 0.5

## 2013-12-09 NOTE — Progress Notes (Signed)
Please see the Nurse Progress Note in the MD Initial Consult Encounter for this patient. 

## 2013-12-09 NOTE — Progress Notes (Signed)
  Radiation Oncology         [phone] ________________________________  Name: Veronica Ward MRN: [id number]  Date: 12/09/2013  DOB: 1946/01/21  Vaginal brachytherapy procedure Note  CC: [name], MD  Marti Sleigh *  Diagnosis:   Endometrial cancer   Primary site: Corpus Uteri - Carcinoma   Staging method: AJCC 7th Edition   Clinical: Stage IA (T1a, N0, M0)   Pathologic: Stage IA (T1a, N0, M0)   Summary: Stage IA (T1a, N0, M0)    Narrative:  The patient returns today for planning and her first high-dose-rate radiation treatment. Patient was seen late last week by gynecologic oncology who felt the vaginal cuff area was well healed and she was ready to proceed with intracavitary brachytherapy treatments. Patient is been doing well since this evaluation. She denies any vaginal bleeding or fever.  She was noted to be neutropenic earlier today and will receive the Neupogen shot later today.                             ALLERGIES:  is allergic to bactrim; oxycodone-acetaminophen; and sulfa antibiotics.  Physical Findings: The patient is in no acute distress. Patient is alert and oriented.  height is 5\' 3"  (1.6 m) and weight is 104 lb 1.6 oz (47.219 kg). Her oral temperature is 98.3 F (36.8 C). Her blood pressure is 120/75 and her pulse is 66. Marland Kitchen  On pelvic examination the vaginal cuff this intact. The patient proceeded to undergo fitting for her vaginal cylinder.  the optimal diameter was a 3 CM cylinder. This distended vaginal vault without undue discomfort  fit her anatomy well.  Lab Findings: Lab Results  Component Value Date   WBC 2.0* 12/09/2013   HGB 10.6* 12/09/2013   HCT 31.7* 12/09/2013   MCV 97.4 12/09/2013   PLT 160 12/09/2013      Radiographic Findings: No results found.  Impression:  The patient tolerated the vaginal brachytherapy procedure well   Plan:  She will be transferred to the CT simulation suite for reinsertion of the cylinder and planning for  her radiation treatments.  ____________________________________ Blair Promise, MD

## 2013-12-09 NOTE — Progress Notes (Signed)
Santiago Glad - radonc - notified pt neutropenic and medonc needs to see pt after radiation.    Discussed neutropenic precautions with patient and provided written instructions with AVS.  Pt reports mouth sores - painful and difficult to eat.  Has been using baking soda to gargle.  MD notified.  POF entered for neupogen appts and lab.  Schedule provided to pt.  Pt voiced understanding.

## 2013-12-09 NOTE — Progress Notes (Signed)
  Radiation Oncology         [phone] ________________________________  Name: Veronica Ward MRN: [id number]  Date: 12/09/2013  DOB: Apr 29, 1946  Simple treatment device note  Today the patient had construction of her custom vaginal cylinder to be used for high-dose-rate treatment. the optimal arrangement was three 3.0 cm rings placed proximally within the vagina and two  2.5 cm rings placed distally within the vaginal vault.  Blair Promise M.D.

## 2013-12-09 NOTE — Progress Notes (Signed)
  Radiation Oncology         [phone] ________________________________  Name: Veronica Ward MRN: [id number]  Date: 12/09/2013  DOB: 04-20-1946  SIMULATION AND TREATMENT PLANNING NOTE  DIAGNOSIS:  Endometrial cancer   Primary site: Corpus Uteri - Carcinoma   Staging method: AJCC 7th Edition   Clinical: Stage IA (T1a, N0, M0)   Pathologic: Stage IA (T1a, N0, M0)   Summary: Stage IA (T1a, N0, M0)   NARRATIVE:  The patient was brought to the [place] suite.  Identity was confirmed.  All relevant records and images related to the planned course of therapy were reviewed.  The patient freely provided informed written consent to proceed with treatment after reviewing the details related to the planned course of therapy. The consent form was witnessed and verified by the simulation staff.  Then, the patient was set-up in a stable reproducible  supine position for radiation therapy.  CT images were obtained.  Surface markings were placed.  The CT images were loaded into the planning software.  Then the target and avoidance structures were contoured.  Treatment planning then occurred.  The radiation prescription was entered and confirmed.   I have requested : Brachii therapy isodose Plan.  I have ordered dose calc. With each treatment.  PLAN:  The patient will receive 30 Gy in 5 fractions with iridium 192 as the high-dose-rate source. Treatment length will be 4 cm and a prescription will be to the mucosal surface. Treatments will be on a weekly basis.  ________________________________

## 2013-12-09 NOTE — Patient Instructions (Signed)
Neutropenia Neutropenia is a condition that occurs when the level of a certain type of white blood cell (neutrophil) in your body becomes lower than normal. Neutrophils are made in the bone marrow and fight infections. These cells protect against bacteria and viruses. The fewer neutrophils you have, and the longer your body remains without them, the greater your risk of getting a severe infection becomes. CAUSES  The cause of neutropenia may be hard to determine. However, it is usually due to 3 main problems:   Decreased production of neutrophils. This may be due to:  Certain medicines such as chemotherapy.  Genetic problems.  Cancer.  Radiation treatments.  Vitamin deficiency.  Some pesticides.  Increased destruction of neutrophils. This may be due to:  Overwhelming infections.  Hemolytic anemia. This is when the body destroys its own blood cells.  Chemotherapy.  Neutrophils moving to areas of the body where they cannot fight infections. This may be due to:  Dialysis procedures.  Conditions where the spleen becomes enlarged. Neutrophils are held in the spleen and are not available to the rest of the body.  Overwhelming infections. The neutrophils are held in the area of the infection and are not available to the rest of the body. SYMPTOMS  There are no specific symptoms of neutropenia. The lack of neutrophils can result in an infection, and an infection can cause various problems. DIAGNOSIS  Diagnosis is made by a blood test. A complete blood count is performed. The normal level of neutrophils in human blood differs with age and race. Infants have lower counts than older children and adults. African Americans have lower counts than Caucasians or Asians. The average adult level is 1500 cells/mm3 of blood. Neutrophil counts are interpreted as follows:  Greater than 1000 cells/mm3 gives normal protection against infection.  500 to 1000 cells/mm3 gives an increased risk for  infection.  200 to 500 cells/mm3 is a greater risk for severe infection.  Lower than 200 cells/mm3 is a marked risk of infection. This may require hospitalization and treatment with antibiotic medicines. TREATMENT  Treatment depends on the underlying cause, severity, and presence of infections or symptoms. It also depends on your health. Your caregiver will discuss the treatment plan with you. Mild cases are often easily treated and have a good outcome. Preventative measures may also be started to limit your risk of infections. Treatment can include:  Taking antibiotics.  Stopping medicines that are known to cause neutropenia.  Correcting nutritional deficiencies by eating green vegetables to supply folic acid and taking vitamin B supplements.  Stopping exposure to pesticides if your neutropenia is related to pesticide exposure.  Taking a blood growth factor called sargramostim, pegfilgrastim, or filgrastim if you are undergoing chemotherapy for cancer. This stimulates white blood cell production.  Removal of the spleen if you have Felty's syndrome and have repeated infections. HOME CARE INSTRUCTIONS   Follow your caregiver's instructions about when you need to have blood work done.  Wash your hands often. Make sure others who come in contact with you also wash their hands.  Wash raw fruits and vegetables before eating them. They can carry bacteria and fungi.  Avoid people with colds or spreadable (contagious) diseases (chickenpox, herpes zoster, influenza).  Avoid large crowds.  Avoid construction areas. The dust can release fungus into the air.  Be cautious around children in daycare or school environments.  Take care of your respiratory system by coughing and deep breathing.  Bathe daily.  Protect your skin from cuts and   burns.  Do not work in the garden or with flowers and plants.  Care for the mouth before and after meals by brushing with a soft toothbrush. If you have  mucositis, do not use mouthwash. Mouthwash contains alcohol and can dry out the mouth even more.  Clean the area between the genitals and the anus (perineal area) after urination and bowel movements. Women need to wipe from front to back.  Use a water soluble lubricant during sexual intercourse and practice good hygiene after. Do not have intercourse if you are severely neutropenic. Check with your caregiver for guidelines.  Exercise daily as tolerated.  Avoid people who were vaccinated with a live vaccine in the past 30 days. You should not receive live vaccines (polio, typhoid).  Do not provide direct care for pets. Avoid animal droppings. Do not clean litter boxes and bird cages.  Do not share food utensils.  Do not use tampons, enemas, or rectal suppositories unless directed by your caregiver.  Use an electric razor to remove hair.  Wash your hands after handling magazines, letters, and newspapers. SEEK IMMEDIATE MEDICAL CARE IF:   You have a fever.  You have chills or start to shake.  You feel nauseous or vomit.  You develop mouth sores.  You develop aches and pains.  You have redness and swelling around open wounds.  Your skin is warm to the touch.  You have pus coming from your wounds.  You develop swollen lymph nodes.  You feel weak or fatigued.  You develop red streaks on the skin. MAKE SURE YOU:  Understand these instructions.  Will watch your condition.  Will get help right away if you are not doing well or get worse. Document Released: 12/30/2001 Document Revised: 10/02/2011 Document Reviewed: 01/27/2011 ExitCare Patient Information 2014 [employer].  

## 2013-12-09 NOTE — Telephone Encounter (Signed)
per desk nurse , Con Memos added inj appts for 5/19, 5/20 and 5/21 and lab for 5/22. per desk nurse she will send pof.

## 2013-12-09 NOTE — Telephone Encounter (Signed)
add to previous note........... schedule given to desk nurse to give to pt.

## 2013-12-09 NOTE — Progress Notes (Signed)
   Department of Radiation Oncology  Phone:  [phone] Fax:        [phone]  High-dose-rate brachytherapy procedure note   after planning was complete the patient was transferred to the high dose rate suite.  Vaginal cylinder was reinserted. This was affixed to the CT/MR stabilization plate to prevent slippage.  Verification simulation   A fiducial marker was placed within the vaginal cylinder. An AP and lateral film was obtained which verified accurate placement of the vaginal cylinder for treatment.    High-dose-rate brachytherapy procedure note  The remote afterloader was attached to the vaginal cylinder by a catheter.  The patient proceeded to undergo her first high-dose-rate treatment. She was prescribed a dose of 6 gray to be delivered to the proximal vaginal mucosal surface. This was achieved using 1 channel with 9 dwell positions. Total treatment time was 257.2 seconds. Patient tolerated the procedure well. After completion of her therapy a radiation survey was performed documenting return of the iridium source into the Nucletron safe.  The patient will return next week for her second treatment.   Blair Promise M.D.

## 2013-12-09 NOTE — Telephone Encounter (Signed)
Opened in error

## 2013-12-10 ENCOUNTER — Ambulatory Visit (HOSPITAL_BASED_OUTPATIENT_CLINIC_OR_DEPARTMENT_OTHER): Payer: Medicare Other | Admitting: Genetic Counselor

## 2013-12-10 ENCOUNTER — Ambulatory Visit (HOSPITAL_BASED_OUTPATIENT_CLINIC_OR_DEPARTMENT_OTHER): Payer: Medicare Other

## 2013-12-10 ENCOUNTER — Other Ambulatory Visit: Payer: Self-pay | Admitting: *Deleted

## 2013-12-10 ENCOUNTER — Encounter: Payer: Self-pay | Admitting: Genetic Counselor

## 2013-12-10 ENCOUNTER — Other Ambulatory Visit: Payer: Medicare Other

## 2013-12-10 ENCOUNTER — Telehealth: Payer: Self-pay | Admitting: *Deleted

## 2013-12-10 VITALS — BP 113/68 | HR 80 | Temp 99.0°F

## 2013-12-10 DIAGNOSIS — Z803 Family history of malignant neoplasm of breast: Secondary | ICD-10-CM | POA: Diagnosis not present

## 2013-12-10 DIAGNOSIS — D709 Neutropenia, unspecified: Secondary | ICD-10-CM

## 2013-12-10 DIAGNOSIS — Z800 Family history of malignant neoplasm of digestive organs: Secondary | ICD-10-CM

## 2013-12-10 DIAGNOSIS — IMO0001 Reserved for inherently not codable concepts without codable children: Secondary | ICD-10-CM | POA: Insufficient documentation

## 2013-12-10 DIAGNOSIS — Z8041 Family history of malignant neoplasm of ovary: Secondary | ICD-10-CM

## 2013-12-10 DIAGNOSIS — C541 Malignant neoplasm of endometrium: Secondary | ICD-10-CM

## 2013-12-10 DIAGNOSIS — C549 Malignant neoplasm of corpus uteri, unspecified: Secondary | ICD-10-CM | POA: Diagnosis not present

## 2013-12-10 MED ORDER — TBO-FILGRASTIM 300 MCG/0.5ML ~~LOC~~ SOSY
300.0000 ug | PREFILLED_SYRINGE | Freq: Once | SUBCUTANEOUS | Status: AC
  Administered 2013-12-10: 300 ug via SUBCUTANEOUS
  Filled 2013-12-10: qty 0.5

## 2013-12-10 MED ORDER — TRIAMCINOLONE ACETONIDE 0.1 % MT PSTE
1.0000 | PASTE | Freq: Two times a day (BID) | OROMUCOSAL | Status: DC
Start: 2013-12-10 — End: 2014-04-20

## 2013-12-10 MED ORDER — FILGRASTIM 300 MCG/0.5ML IJ SOLN: 300.0000 ug | Freq: Once | INTRAMUSCULAR | Status: DC

## 2013-12-10 NOTE — Telephone Encounter (Signed)
Message copied by Patton Salles on Wed Dec 10, 2013  4:10 PM ------      Message from: Evlyn Clines P      Created: Wed Dec 10, 2013  4:07 PM       Can try prescription triamcinolone acetonide 0.1% dental paste, one tube, apply at hs or twice daily to cold sores inside of mouth      Ok to do this 5-21      thanks      ----- Message -----         From: Patton Salles, RN         Sent: 12/10/2013   3:40 PM           To: Lennis Marion Downer, MD            No thrush, has red area on left side of tongue. States she has a history of cold sores with stress, but has never had any medication for them. Treyson Axel       ------

## 2013-12-10 NOTE — Progress Notes (Signed)
Patient Name: Veronica Ward Patient Age: 68 y.o. Encounter Date: 12/10/2013  Referring Physician: Evlyn Clines, MD Gery Pray, MD  Primary Care Provider: [name], MD   [name], a 68 y.o. Ashkenazi Jewish female, is being seen at the [facility] due to a personal history of endometrial cancer and family history of breast and ovarian cancers.  She presents to clinic today to discuss the possibility of a hereditary predisposition to cancer and discuss whether genetic testing is warranted.  HISTORY OF PRESENT ILLNESS: [name] was diagnosed with endometrial cancer recently at the age of 62. She is s/p surgery and is receiving chemotherapy and radiation. She has a history of non-melanoma skin cancer as well as carcinom in situ of the cervix.  She states she has a yearly mammogram and clinical breast exam. She has a colonoscopy every 10 years and does not think she has had a polyp identified.   Past Medical History  Diagnosis Date  . Hx of basal cell carcinoma     face  . Diffuse cystic mastopathy   . Headache(784.0)   . Unspecified hemorrhoids without mention of complication   . Other malaise and fatigue   . Disorder of bone and cartilage, unspecified   . Other psoriasis   . Other seborrheic keratosis   . Sleep disturbance, unspecified   . Predominant disturbance of emotions   . Pain in limb   . Urticaria, unspecified   . History of shingles   . Anxiety   . Anemia   . Dysautonomia     mild  . Osteoporosis   . Endometrial cancer   . Ashkenazi Jewish ancestry     Past Surgical History  Procedure Laterality Date  . Cervical conization w/bx    . Cervical cancer  1973  . Breast biopsy Left 2001    benign  . Tubal ligation    . Dexa  03/2002    oseopenia  . Colonoscopy  12/04    int. hemorrhoids  . Dexa  07/2004    decreased BMD, osteopenia  . Dexa  02/2007    Osteopenia  . Breast lumpectomy      left underarm  . Birthmark removal   childhood  . Dexa  9/10    Osteopenia slightly worse  . Felon i&d  12/2010    Dr. Fredna Dow, I&D in OR (L index finger)  . Finger surgery    . Knee surgery      right  . Robotic assisted total hysterectomy with bilateral salpingo oopherectomy  10/03/2013    Robotic-assisted total laparoscopic hysterectomy, bilateral salpingo-oophorectomy, bilateral pelvic and para-aortic lymphadenectomy with sentinel lymph node dissection  . Tonsilectomy, adenoidectomy, bilateral myringotomy and tubes    . Axillary lymph node dissection      left    FAMILY HISTORY:   During the visit, a 4-generation pedigree was obtained. Significant diagnoses include the following:  Family History  Problem Relation Age of Onset  . Multiple myeloma Father   . Coronary artery disease Father   . Transient ischemic attack Mother   . Lupus Maternal Aunt   . Breast cancer Maternal Aunt     dx 27s; deceased  . Cancer Maternal Grandfather     GI cancer or pancreatic; deceased 75  . Colon cancer Neg Hx   . Ovarian cancer Maternal Aunt     dx 105s; deceased 39s  . Multiple sclerosis Brother     Additionally, Ms. Gunnoe has two sons (  age 40 and 42) and a daughter (age 54) who are cancer-free. Her sister (age 73) is cancer-free. Her mother is cancer-free at age 35, but did have a TAH/BSO at 33 due to fibroids.  Ms. Leavens ancestry is Ashkenazi Jewish.   ASSESSMENT AND PLAN: [name] is a 68 y.o. Ashkenazi [place] female with a personal history of uterine cancer and family history of breast and ovarian cancer in maternal relatives. This history is suggestive of a hereditary predisposition to cancer, specifically BRCA1 or BRCA2. We reviewed the characteristics, features and inheritance patterns of hereditary cancer syndromes. We discussed that she is not at very high risk of Lynch syndrome given that her mother is 86 and cancer-free and her father did not have a Lynch-associated cancer. This, however, will be ruled out with genetic  testing. We discussed genetic testing, including the process of testing, insurance coverage and implications of results.   Ms. Casso wished to pursue genetic testing and a blood sample will be sent to [facility] for analysis of 24 genes on the OvaNext panel. This panel was selected to evaluate BRCA as well as the Lynch genes. We discussed the implications of a positive, negative and/ or Variant of Uncertain Significance (VUS) result. Results should be available in approximately 4-5 weeks, at which point we will contact her and address implications for her as well as address genetic testing for at-risk family members, if needed.    We encouraged Ms. Surratt to remain in contact with Cancer Genetics annually so that we can update the family history and inform her of any changes in cancer genetics and testing that may be of benefit for this family. Ms.  Foell questions were answered to her satisfaction today.   Thank you for the referral and allowing [place] to share in the care of your patient.   The patient was seen for a total of 30 minutes, greater than 50% of which was spent face-to-face counseling. This patient was discussed with the referring provider who agrees with the above.

## 2013-12-11 ENCOUNTER — Ambulatory Visit (HOSPITAL_BASED_OUTPATIENT_CLINIC_OR_DEPARTMENT_OTHER): Payer: Medicare Other

## 2013-12-11 VITALS — BP 110/60 | HR 76 | Temp 99.6°F

## 2013-12-11 DIAGNOSIS — C549 Malignant neoplasm of corpus uteri, unspecified: Secondary | ICD-10-CM | POA: Diagnosis not present

## 2013-12-11 DIAGNOSIS — C541 Malignant neoplasm of endometrium: Secondary | ICD-10-CM

## 2013-12-11 DIAGNOSIS — Z5189 Encounter for other specified aftercare: Secondary | ICD-10-CM | POA: Diagnosis not present

## 2013-12-11 MED ORDER — TBO-FILGRASTIM 300 MCG/0.5ML ~~LOC~~ SOSY
300.0000 ug | PREFILLED_SYRINGE | Freq: Once | SUBCUTANEOUS | Status: AC
  Administered 2013-12-11: 300 ug via SUBCUTANEOUS
  Filled 2013-12-11: qty 0.5

## 2013-12-12 ENCOUNTER — Ambulatory Visit: Payer: Medicare Other

## 2013-12-12 ENCOUNTER — Encounter (HOSPITAL_BASED_OUTPATIENT_CLINIC_OR_DEPARTMENT_OTHER): Payer: Medicare Other | Admitting: *Deleted

## 2013-12-12 ENCOUNTER — Other Ambulatory Visit: Payer: Self-pay | Admitting: Oncology

## 2013-12-12 ENCOUNTER — Telehealth: Payer: Self-pay | Admitting: *Deleted

## 2013-12-12 DIAGNOSIS — C541 Malignant neoplasm of endometrium: Secondary | ICD-10-CM

## 2013-12-12 DIAGNOSIS — C549 Malignant neoplasm of corpus uteri, unspecified: Secondary | ICD-10-CM | POA: Diagnosis not present

## 2013-12-12 LAB — CBC WITH DIFFERENTIAL/PLATELET
BASO%: 0.4 % (ref 0.0–2.0)
Basophils Absolute: 0.1 10*3/uL (ref 0.0–0.1)
EOS%: 1.4 % (ref 0.0–7.0)
Eosinophils Absolute: 0.3 10*3/uL (ref 0.0–0.5)
HCT: 33.4 % — ABNORMAL LOW (ref 34.8–46.6)
HGB: 10.9 g/dL — ABNORMAL LOW (ref 11.6–15.9)
LYMPH%: 7.4 % — ABNORMAL LOW (ref 14.0–49.7)
MCH: 32.1 pg (ref 25.1–34.0)
MCHC: 32.7 g/dL (ref 31.5–36.0)
MCV: 98.1 fL (ref 79.5–101.0)
MONO#: 1.8 10*3/uL — ABNORMAL HIGH (ref 0.1–0.9)
MONO%: 9.9 % (ref 0.0–14.0)
NEUT#: 14.4 10*3/uL — ABNORMAL HIGH (ref 1.5–6.5)
NEUT%: 80.9 % — ABNORMAL HIGH (ref 38.4–76.8)
Platelets: 158 10*3/uL (ref 145–400)
RBC: 3.4 10*6/uL — ABNORMAL LOW (ref 3.70–5.45)
RDW: 13.7 % (ref 11.2–14.5)
WBC: 17.7 10*3/uL — ABNORMAL HIGH (ref 3.9–10.3)
lymph#: 1.3 10*3/uL (ref 0.9–3.3)

## 2013-12-12 MED ORDER — TBO-FILGRASTIM 300 MCG/0.5ML ~~LOC~~ SOSY
300.0000 ug | PREFILLED_SYRINGE | Freq: Once | SUBCUTANEOUS | Status: DC
  Filled 2013-12-12: qty 0.5

## 2013-12-12 NOTE — Progress Notes (Signed)
Per Dr Marko Plume Pt does not need Granix injection today.

## 2013-12-12 NOTE — Telephone Encounter (Signed)
Called patient to remind of HDR Tx. On 12-16-13, spoke with patient and she is aware of this appt.

## 2013-12-16 ENCOUNTER — Ambulatory Visit
Admission: RE | Admit: 2013-12-16 | Discharge: 2013-12-16 | Disposition: A | Discharge status: Home / Self Care | Payer: Medicare Other | Source: Ambulatory Visit | Attending: Radiation Oncology | Admitting: Radiation Oncology

## 2013-12-16 DIAGNOSIS — Z9079 Acquired absence of other genital organ(s): Secondary | ICD-10-CM | POA: Diagnosis not present

## 2013-12-16 DIAGNOSIS — Z90710 Acquired absence of both cervix and uterus: Secondary | ICD-10-CM | POA: Diagnosis not present

## 2013-12-16 DIAGNOSIS — M810 Age-related osteoporosis without current pathological fracture: Secondary | ICD-10-CM | POA: Diagnosis not present

## 2013-12-16 DIAGNOSIS — Z510 Encounter for antineoplastic radiation therapy: Secondary | ICD-10-CM | POA: Diagnosis not present

## 2013-12-16 DIAGNOSIS — C541 Malignant neoplasm of endometrium: Secondary | ICD-10-CM

## 2013-12-16 DIAGNOSIS — Z803 Family history of malignant neoplasm of breast: Secondary | ICD-10-CM | POA: Diagnosis not present

## 2013-12-16 DIAGNOSIS — C549 Malignant neoplasm of corpus uteri, unspecified: Secondary | ICD-10-CM | POA: Diagnosis not present

## 2013-12-16 NOTE — Addendum Note (Signed)
Encounter addended by: Blair Promise, MD on: 12/16/2013  4:13 PM<BR>     Documentation filed: Notes Section

## 2013-12-16 NOTE — Progress Notes (Signed)
   Department of Radiation Oncology  Phone:  [phone]  12/16/2013  High-dose-rate brachytherapy procedure note  Endometrial cancer   Primary site: Corpus Uteri - Carcinoma   Staging method: AJCC 7th Edition   Clinical: Stage IA (T1a, N0, M0)   Pathologic: Stage IA (T1a, N0, M0)   Summary: Stage IA (T1a, N0, M0)   Simple treatment device note  Today the patient had construction of her custom vaginal cylinder to be used for high-dose-rate treatment. the optimal arrangement was three 3.0 cm rings placed proximally within the vagina and two 2.5 cm rings placed distally within the vaginal vault.    Vaginal brachytherapy procedure Note  Patient was placed in the dorsolithotomy position on the high-dose-rate treatment table. Pelvic exam was performed revealing the vaginal cuff to be intact.  Patient proceeded to have placement of her vaginal cylinder into the proximal vagina. The vaginal cylinder was affixed to the CT/MR stabilization plate to prevent slippage. Patient tolerated the procedure well.  Verification simulation  A fiducial marker was placed within the vaginal cylinder. An AP and lateral film was obtained which verified accurate placement of the vaginal cylinder for treatment.  High-dose-rate brachytherapy procedure note  The remote afterloader was attached to the vaginal cylinder by a catheter. The patient proceeded to undergo her second high-dose-rate treatment. She was prescribed a dose of 6 gray to be delivered to the proximal vaginal mucosal surface. This was achieved using 1 channel with 9 dwell positions. Total treatment time was 274.8 seconds. Patient tolerated the procedure well. After completion of her therapy a radiation survey was performed documenting return of the iridium source into the Nucletron safe. The patient will return next week for her third treatment.   Blair Promise M.D.

## 2013-12-17 ENCOUNTER — Encounter: Payer: Self-pay | Admitting: Oncology

## 2013-12-17 ENCOUNTER — Telehealth: Payer: Self-pay | Admitting: Oncology

## 2013-12-17 ENCOUNTER — Ambulatory Visit (HOSPITAL_BASED_OUTPATIENT_CLINIC_OR_DEPARTMENT_OTHER): Payer: Medicare Other | Admitting: Oncology

## 2013-12-17 ENCOUNTER — Other Ambulatory Visit (HOSPITAL_BASED_OUTPATIENT_CLINIC_OR_DEPARTMENT_OTHER): Payer: Medicare Other

## 2013-12-17 ENCOUNTER — Telehealth: Payer: Self-pay | Admitting: *Deleted

## 2013-12-17 VITALS — BP 120/66 | HR 93 | Temp 98.8°F | Resp 18 | Ht 63.0 in | Wt 104.8 lb

## 2013-12-17 DIAGNOSIS — C549 Malignant neoplasm of corpus uteri, unspecified: Secondary | ICD-10-CM

## 2013-12-17 DIAGNOSIS — C541 Malignant neoplasm of endometrium: Secondary | ICD-10-CM

## 2013-12-17 LAB — CBC WITH DIFFERENTIAL/PLATELET
BASO%: 0.2 % (ref 0.0–2.0)
Basophils Absolute: 0 10*3/uL (ref 0.0–0.1)
EOS%: 0.5 % (ref 0.0–7.0)
Eosinophils Absolute: 0.1 10*3/uL (ref 0.0–0.5)
HCT: 33 % — ABNORMAL LOW (ref 34.8–46.6)
HGB: 10.7 g/dL — ABNORMAL LOW (ref 11.6–15.9)
LYMPH%: 6.9 % — ABNORMAL LOW (ref 14.0–49.7)
MCH: 31.8 pg (ref 25.1–34.0)
MCHC: 32.5 g/dL (ref 31.5–36.0)
MCV: 97.9 fL (ref 79.5–101.0)
MONO#: 0.6 10*3/uL (ref 0.1–0.9)
MONO%: 5.7 % (ref 0.0–14.0)
NEUT#: 9.1 10*3/uL — ABNORMAL HIGH (ref 1.5–6.5)
NEUT%: 86.7 % — ABNORMAL HIGH (ref 38.4–76.8)
Platelets: 169 10*3/uL (ref 145–400)
RBC: 3.37 10*6/uL — ABNORMAL LOW (ref 3.70–5.45)
RDW: 13.5 % (ref 11.2–14.5)
WBC: 10.6 10*3/uL — ABNORMAL HIGH (ref 3.9–10.3)
lymph#: 0.7 10*3/uL — ABNORMAL LOW (ref 0.9–3.3)

## 2013-12-17 LAB — COMPREHENSIVE METABOLIC PANEL (CC13)
ALT: 14 U/L (ref 0–55)
AST: 18 U/L (ref 5–34)
Albumin: 3.5 g/dL (ref 3.5–5.0)
Alkaline Phosphatase: 85 U/L (ref 40–150)
Anion Gap: 13 mEq/L — ABNORMAL HIGH (ref 3–11)
BUN: 14 mg/dL (ref 7.0–26.0)
CO2: 24 mEq/L (ref 22–29)
Calcium: 9.3 mg/dL (ref 8.4–10.4)
Chloride: 101 mEq/L (ref 98–109)
Creatinine: 0.7 mg/dL (ref 0.6–1.1)
Glucose: 133 mg/dl (ref 70–140)
Potassium: 4 mEq/L (ref 3.5–5.1)
Sodium: 138 mEq/L (ref 136–145)
Total Bilirubin: 0.32 mg/dL (ref 0.20–1.20)
Total Protein: 6.9 g/dL (ref 6.4–8.3)

## 2013-12-17 NOTE — Telephone Encounter (Signed)
Per staff message and POF I have scheduled appts.  JMW  

## 2013-12-17 NOTE — Progress Notes (Signed)
OFFICE PROGRESS NOTE   12/17/2013   Physicians:Soper, John,Tower, [place]; Constant, Peggy; Kinard, Jeneen Rinks; Laurann Montana, [facility]); Mellody Drown (gyn onc [facility]);Kaplan, Carman Ching, Josph Macho   INTERVAL HISTORY:  Patient is seen, alone for visit, in continuing attention to adjuvant chemotherapy in process for high grade serous T1aN0 endometrial carcinoma, due cycle 3 taxol carboplatin on 12-19-13, with neupogen. She is receiving concomitant brachytherapy by Dr Sondra Come.  Patient overall is tolerating all of this remarkably well, tho she has been more emotional in last week. She had no significant aches with cycle 3 taxol or neupogen, used claritin and tylenol. Appetite is still good and she has no peripheral neuropathy.   She does not have PAC.  ONCOLOGIC HISTORY Patient had been in usual good health until she noticed vaginal spotting in Feb 2015. She was seen by PCP Dr Glori Bickers, with endometrial biopsy 09-17-13 by Dr Elly Modena [id number]) endometrial adenocarcinoma, which appeared to be grade II. She had pelvic and transvaginal US 09-23-13. She was seen in consultation by Dr Josephina Shih, then referred to Dr Clarene Essex at [facility] for earliest surgical availability. She had preoperative CXR at [facility]. Surgery 10-03-13 was robotic assisted total laparoscopic hysterectomy/ BSO/ bilateral pelvic and para-aortic nodes with sentinel node evaluation, tolerated well and patient discharged home on POD #1. [facility] surgical pathology [phone]) showed serous carcinoma FIGO grade 3 without myometrial invasion, no adnexal, serosal or cervical involvement, no LVSI, 0/27 nodes involved (9 para-aortic, 18 pelvic), pT1a N0. She saw Dr Clarene Essex in follow up on 10-05-13, with recommendation for 6 cycles of adjuvant carboplatin and taxol chemotherapy and brachytherapy; she also had consultation with radiation oncology at [facility]. Chemotherapy education was done at [facility] 10-16-13. She had second opinion consultation by Dr Fransisca Connors at  [facility]. First taxol carboplatin was given 11-06-13. Brachytherapy  Review of systems as above, also: Itching on top of scalp similar to previous psoriasis, will try usual tar shampoo. No worse fatigue. No bleeding. Bowels ok. Remainder of 10 point Review of Systems negative.  Objective:  Vital signs in last 24 hours:  BP 120/66  Pulse 93  Temp(Src) 98.8 F (37.1 C) (Oral)  Resp 18  Ht 5\' 3"  (1.6 m)  Wt 104 lb 12.8 oz (47.537 kg)  BMI 18.57 kg/m2  LMP 09/17/2000 Weight is stable. Alert, oriented and appropriate. Easily mobile.  Alopecia  HEENT:PERRL, sclerae not icteric. Oral mucosa moist without lesions, posterior pharynx clear. Scalp superiorly with dry desquamation, no erythema, otherwise no folliculitis or other findings. Neck supple. No JVD.  Lymphatics:no cervical,suraclavicular, axillary or inguinal adenopathy Resp: clear to auscultation bilaterally and normal percussion bilaterally Cardio: regular rate and rhythm. No gallop. GI: soft, nontender, not distended, no mass or organomegaly. Normally active bowel sounds. Surgical incision not remarkable. Musculoskeletal/ Extremities: without pitting edema, cords, tenderness Neuro: no peripheral neuropathy. Otherwise nonfocal. PSYCH: mood and affect appropriate Skin otherwise without rash, ecchymosis, petechiae   Lab Results:  Results for orders placed in visit on 12/17/13  CBC WITH DIFFERENTIAL      Result Value Ref Range   WBC 10.6 (*) 3.9 - 10.3 10e3/uL   NEUT# 9.1 (*) 1.5 - 6.5 10e3/uL   HGB 10.7 (*) 11.6 - 15.9 g/dL   HCT 33.0 (*) 34.8 - 46.6 %   Platelets 169  145 - 400 10e3/uL   MCV 97.9  79.5 - 101.0 fL   MCH 31.8  25.1 - 34.0 pg   MCHC 32.5  31.5 - 36.0 g/dL   RBC 3.37 (*)  3.70 - 5.45 10e6/uL   RDW 13.5  11.2 - 14.5 %   lymph# 0.7 (*) 0.9 - 3.3 10e3/uL   MONO# 0.6  0.1 - 0.9 10e3/uL   Eosinophils Absolute 0.1  0.0 - 0.5 10e3/uL   Basophils Absolute 0.0  0.0 - 0.1 10e3/uL   NEUT% 86.7 (*) 38.4 - 76.8 %    LYMPH% 6.9 (*) 14.0 - 49.7 %   MONO% 5.7  0.0 - 14.0 %   EOS% 0.5  0.0 - 7.0 %   BASO% 0.2  0.0 - 2.0 %  COMPREHENSIVE METABOLIC PANEL (VO35)      Result Value Ref Range   Sodium 138  136 - 145 mEq/L   Potassium 4.0  3.5 - 5.1 mEq/L   Chloride 101  98 - 109 mEq/L   CO2 24  22 - 29 mEq/L   Glucose 133  70 - 140 mg/dl   BUN 14.0  7.0 - 26.0 mg/dL   Creatinine 0.7  0.6 - 1.1 mg/dL   Total Bilirubin 0.32  0.20 - 1.20 mg/dL   Alkaline Phosphatase 85  40 - 150 U/L   AST 18  5 - 34 U/L   ALT 14  0 - 55 U/L   Total Protein 6.9  6.4 - 8.3 g/dL   Albumin 3.5  3.5 - 5.0 g/dL   Calcium 9.3  8.4 - 10.4 mg/dL   Anion Gap 13 (*) 3 - 11 mEq/L     Studies/Results:  No results found.  Medications: I have reviewed the patient's current medications.Will resume tar shampoo  DISCUSSION: she would like to speak with [place] support staff and I have asked Epifania Gore to be in contact.   Assessment/Plan: 1.IA high grade serous endometrial carcinoma: post TAH/BSO/ pelvic and para-aortic node evaluation at [facility] 10-03-13 and first taxol carbo on 11-06-13.  Due cycle 3 12-19-13, gCSF x3 beginning 12-24-13. HDR in process, to complete 12-30-13. I will see her again with labs 01-05-14. 2.family history of breast and ovarian cancer, Ashkenazi Jewish descent. Genetics testing sent and pending 3.osteoporosis: ok to hold Fosamax if nausea from chemo, which hopefully will be only intermittently if so  4.minimal psoriasis: stable  5.possible allergic reaction to sulfa, which we will avoid. 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  7.internal hemorrhoids at recent colonoscopy. Prn sitz baths    Patient is in agreement with plan above. Chemotherapy orders confirmed.    Gordy Levan, MD    12/17/2013, 4:54 PM

## 2013-12-17 NOTE — Telephone Encounter (Signed)
per pof to sch pt appt-sent MW an emailt to sch pt trmt-sent LL an email to adv which day to sch pt -pt stated will check MY CHART for the dates & time

## 2013-12-18 ENCOUNTER — Telehealth: Payer: Self-pay | Admitting: Oncology

## 2013-12-18 NOTE — Telephone Encounter (Signed)
cld & spoke to pt and gave time & date of appt for trmt and inj-adv pt I would mail copy of sch out. Mailed out copy of sch

## 2013-12-19 ENCOUNTER — Telehealth: Payer: Self-pay | Admitting: Oncology

## 2013-12-19 ENCOUNTER — Ambulatory Visit (HOSPITAL_BASED_OUTPATIENT_CLINIC_OR_DEPARTMENT_OTHER): Payer: Medicare Other

## 2013-12-19 ENCOUNTER — Encounter: Payer: Self-pay | Admitting: Specialist

## 2013-12-19 VITALS — BP 112/75 | HR 85 | Temp 98.0°F | Resp 18

## 2013-12-19 DIAGNOSIS — Z5111 Encounter for antineoplastic chemotherapy: Secondary | ICD-10-CM

## 2013-12-19 DIAGNOSIS — C541 Malignant neoplasm of endometrium: Secondary | ICD-10-CM

## 2013-12-19 DIAGNOSIS — C549 Malignant neoplasm of corpus uteri, unspecified: Secondary | ICD-10-CM

## 2013-12-19 MED ORDER — DIPHENHYDRAMINE HCL 50 MG/ML IJ SOLN
12.5000 mg | Freq: Once | INTRAMUSCULAR | Status: AC
  Administered 2013-12-19: 12.5 mg via INTRAVENOUS

## 2013-12-19 MED ORDER — FAMOTIDINE IN NACL 20-0.9 MG/50ML-% IV SOLN
INTRAVENOUS | Status: AC
  Filled 2013-12-19: qty 50

## 2013-12-19 MED ORDER — SODIUM CHLORIDE 0.9 % IV SOLN
322.0000 mg | Freq: Once | INTRAVENOUS | Status: AC
  Administered 2013-12-19: 320 mg via INTRAVENOUS
  Filled 2013-12-19: qty 32

## 2013-12-19 MED ORDER — HEPARIN SOD (PORK) LOCK FLUSH 100 UNIT/ML IV SOLN
500.0000 [IU] | Freq: Once | INTRAVENOUS | Status: DC | PRN
  Filled 2013-12-19: qty 5

## 2013-12-19 MED ORDER — SODIUM CHLORIDE 0.9 % IJ SOLN
10.0000 mL | INTRAMUSCULAR | Status: DC | PRN
  Filled 2013-12-19: qty 10

## 2013-12-19 MED ORDER — SODIUM CHLORIDE 0.9 % IV SOLN
Freq: Once | INTRAVENOUS | Status: AC
  Administered 2013-12-19: 12:00:00 via INTRAVENOUS

## 2013-12-19 MED ORDER — ONDANSETRON 16 MG/50ML IVPB (CHCC)
16.0000 mg | Freq: Once | INTRAVENOUS | Status: AC
  Administered 2013-12-19: 16 mg via INTRAVENOUS

## 2013-12-19 MED ORDER — LORAZEPAM 1 MG PO TABS
0.5000 mg | ORAL_TABLET | Freq: Once | ORAL | Status: AC | PRN
  Administered 2013-12-19: 0.5 mg via ORAL

## 2013-12-19 MED ORDER — DEXAMETHASONE SODIUM PHOSPHATE 20 MG/5ML IJ SOLN
20.0000 mg | Freq: Once | INTRAMUSCULAR | Status: AC
  Administered 2013-12-19: 20 mg via INTRAVENOUS

## 2013-12-19 MED ORDER — PACLITAXEL CHEMO INJECTION 300 MG/50ML
175.0000 mg/m2 | Freq: Once | INTRAVENOUS | Status: AC
  Administered 2013-12-19: 252 mg via INTRAVENOUS
  Filled 2013-12-19: qty 42

## 2013-12-19 MED ORDER — DEXAMETHASONE SODIUM PHOSPHATE 20 MG/5ML IJ SOLN
INTRAMUSCULAR | Status: AC
  Filled 2013-12-19: qty 5

## 2013-12-19 MED ORDER — LORAZEPAM 1 MG PO TABS
ORAL_TABLET | ORAL | Status: AC
  Filled 2013-12-19: qty 1

## 2013-12-19 MED ORDER — ONDANSETRON 16 MG/50ML IVPB (CHCC)
INTRAVENOUS | Status: AC
  Filled 2013-12-19: qty 16

## 2013-12-19 MED ORDER — FAMOTIDINE IN NACL 20-0.9 MG/50ML-% IV SOLN
20.0000 mg | Freq: Once | INTRAVENOUS | Status: AC
  Administered 2013-12-19: 20 mg via INTRAVENOUS

## 2013-12-19 MED ORDER — DIPHENHYDRAMINE HCL 50 MG/ML IJ SOLN
INTRAMUSCULAR | Status: AC
  Filled 2013-12-19: qty 1

## 2013-12-19 NOTE — Patient Instructions (Signed)
[facility] Discharge Instructions for Patients Receiving Chemotherapy  Today you received the following chemotherapy agents: Taxol, Carboplatin  To help prevent nausea and vomiting after your treatment, we encourage you to take your nausea medication as prescribed.    If you develop nausea and vomiting that is not controlled by your nausea medication, call the clinic.   BELOW ARE SYMPTOMS THAT SHOULD BE REPORTED IMMEDIATELY:  *FEVER GREATER THAN 100.5 F  *CHILLS WITH OR WITHOUT FEVER  NAUSEA AND VOMITING THAT IS NOT CONTROLLED WITH YOUR NAUSEA MEDICATION  *UNUSUAL SHORTNESS OF BREATH  *UNUSUAL BRUISING OR BLEEDING  TENDERNESS IN MOUTH AND THROAT WITH OR WITHOUT PRESENCE OF ULCERS  *URINARY PROBLEMS  *BOWEL PROBLEMS  UNUSUAL RASH Items with * indicate a potential emergency and should be followed up as soon as possible.  Feel free to call the clinic you have any questions or concerns. The clinic phone number is [phone].    

## 2013-12-19 NOTE — Progress Notes (Signed)
MD referral. Sat with patient while she was getting chemo. Explored how patient finds meaning and purpose. Since retiring as a professor, she is doing Nurse, children's, writing, and reviewing works which seems to give her immense satisfaction and helps her cope with her diagnosis. Discussed gifts of being able to explore mortality and existential issues. 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  Veronica Gore, PhD, [place]

## 2013-12-19 NOTE — Telephone Encounter (Signed)
cld & spoke w/pt to advise of the trmt time on 6/19-per LL to [employer] appts to that date-pt understood.

## 2013-12-22 ENCOUNTER — Telehealth: Payer: Self-pay | Admitting: *Deleted

## 2013-12-22 NOTE — Telephone Encounter (Signed)
CALLED PATIENT TO REMIND OF HDR TX. FOR 12-23-13, SPOKE WITH PATIENT AND SHE IS AWARE OF THIS [place].

## 2013-12-23 ENCOUNTER — Ambulatory Visit: Payer: Medicare Other | Admitting: Physical Therapy

## 2013-12-23 ENCOUNTER — Ambulatory Visit
Admission: RE | Admit: 2013-12-23 | Discharge: 2013-12-23 | Disposition: A | Discharge status: Home / Self Care | Payer: Medicare Other | Source: Ambulatory Visit | Attending: Radiation Oncology | Admitting: Radiation Oncology

## 2013-12-23 DIAGNOSIS — M810 Age-related osteoporosis without current pathological fracture: Secondary | ICD-10-CM | POA: Diagnosis not present

## 2013-12-23 DIAGNOSIS — Z90710 Acquired absence of both cervix and uterus: Secondary | ICD-10-CM | POA: Diagnosis not present

## 2013-12-23 DIAGNOSIS — Z9079 Acquired absence of other genital organ(s): Secondary | ICD-10-CM | POA: Diagnosis not present

## 2013-12-23 DIAGNOSIS — Z803 Family history of malignant neoplasm of breast: Secondary | ICD-10-CM | POA: Diagnosis not present

## 2013-12-23 DIAGNOSIS — C541 Malignant neoplasm of endometrium: Secondary | ICD-10-CM

## 2013-12-23 DIAGNOSIS — C549 Malignant neoplasm of corpus uteri, unspecified: Secondary | ICD-10-CM | POA: Diagnosis not present

## 2013-12-23 DIAGNOSIS — Z510 Encounter for antineoplastic radiation therapy: Secondary | ICD-10-CM | POA: Diagnosis not present

## 2013-12-23 NOTE — Progress Notes (Signed)
  Department of Radiation Oncology  Phone: [phone]   12/23/2013  High-dose-rate brachytherapy procedure note    Endometrial cancer  Primary site: Corpus Uteri - Carcinoma  Staging method: AJCC 7th Edition  Clinical: Stage IA (T1a, N0, M0)  Pathologic: Stage IA (T1a, N0, M0)  Summary: Stage IA (T1a, N0, M0)   Simple treatment device note  Today the patient had construction of her custom vaginal cylinder to be used for high-dose-rate treatment. the optimal arrangement was three 3.0 cm rings placed proximally within the vagina and two 2.5 cm rings placed distally within the vaginal vault.    Vaginal brachytherapy procedure Note  Patient was placed in the dorsolithotomy position on the high-dose-rate treatment table. Pelvic exam was performed revealing the vaginal cuff to be intact. A speculum exam was performed since the patient has noticed some spotting after her last treatment. There was some mild erythema to the vaginal cuff but no other issues. Patient proceeded to have placement of her vaginal cylinder into the proximal vagina. The vaginal cylinder was affixed to the CT/MR stabilization plate to prevent slippage. Patient tolerated the procedure well.   Verification simulation   A fiducial marker was placed within the vaginal cylinder. An AP and lateral film was obtained which verified accurate placement of the vaginal cylinder for treatment.   High-dose-rate brachytherapy procedure note   The remote afterloader was attached to the vaginal cylinder by a catheter. The patient proceeded to undergo her third high-dose-rate treatment. She was prescribed a dose of 6 gray to be delivered to the proximal vaginal mucosal surface. This was achieved using 1 channel with 9 dwell positions. Total treatment time was 293.2 seconds. Patient tolerated the procedure well. After completion of her therapy a radiation survey was performed documenting return of the iridium source into the Nucletron  safe. The patient will return next week for her fourth treatment.  Blair Promise M.D.

## 2013-12-24 ENCOUNTER — Ambulatory Visit (HOSPITAL_BASED_OUTPATIENT_CLINIC_OR_DEPARTMENT_OTHER): Payer: Medicare Other

## 2013-12-24 VITALS — BP 105/55 | HR 79 | Temp 99.2°F

## 2013-12-24 DIAGNOSIS — C549 Malignant neoplasm of corpus uteri, unspecified: Secondary | ICD-10-CM

## 2013-12-24 DIAGNOSIS — C541 Malignant neoplasm of endometrium: Secondary | ICD-10-CM

## 2013-12-24 DIAGNOSIS — Z5189 Encounter for other specified aftercare: Secondary | ICD-10-CM

## 2013-12-24 MED ORDER — TBO-FILGRASTIM 300 MCG/0.5ML ~~LOC~~ SOSY
300.0000 ug | PREFILLED_SYRINGE | Freq: Once | SUBCUTANEOUS | Status: AC
  Administered 2013-12-24: 300 ug via SUBCUTANEOUS
  Filled 2013-12-24: qty 0.5

## 2013-12-25 ENCOUNTER — Ambulatory Visit (HOSPITAL_BASED_OUTPATIENT_CLINIC_OR_DEPARTMENT_OTHER): Payer: Medicare Other

## 2013-12-25 VITALS — BP 115/53 | HR 77 | Temp 98.6°F

## 2013-12-25 DIAGNOSIS — C549 Malignant neoplasm of corpus uteri, unspecified: Secondary | ICD-10-CM

## 2013-12-25 DIAGNOSIS — C541 Malignant neoplasm of endometrium: Secondary | ICD-10-CM

## 2013-12-25 DIAGNOSIS — L819 Disorder of pigmentation, unspecified: Secondary | ICD-10-CM | POA: Diagnosis not present

## 2013-12-25 DIAGNOSIS — Z5189 Encounter for other specified aftercare: Secondary | ICD-10-CM

## 2013-12-25 DIAGNOSIS — L821 Other seborrheic keratosis: Secondary | ICD-10-CM | POA: Diagnosis not present

## 2013-12-25 DIAGNOSIS — L219 Seborrheic dermatitis, unspecified: Secondary | ICD-10-CM | POA: Diagnosis not present

## 2013-12-25 MED ORDER — TBO-FILGRASTIM 300 MCG/0.5ML ~~LOC~~ SOSY
300.0000 ug | PREFILLED_SYRINGE | Freq: Once | SUBCUTANEOUS | Status: AC
  Administered 2013-12-25: 300 ug via SUBCUTANEOUS
  Filled 2013-12-25: qty 0.5

## 2013-12-26 ENCOUNTER — Ambulatory Visit (HOSPITAL_BASED_OUTPATIENT_CLINIC_OR_DEPARTMENT_OTHER): Payer: Medicare Other

## 2013-12-26 VITALS — BP 103/53 | HR 90 | Temp 98.7°F

## 2013-12-26 DIAGNOSIS — C549 Malignant neoplasm of corpus uteri, unspecified: Secondary | ICD-10-CM

## 2013-12-26 DIAGNOSIS — Z5189 Encounter for other specified aftercare: Secondary | ICD-10-CM

## 2013-12-26 DIAGNOSIS — C541 Malignant neoplasm of endometrium: Secondary | ICD-10-CM

## 2013-12-26 MED ORDER — TBO-FILGRASTIM 300 MCG/0.5ML ~~LOC~~ SOSY
300.0000 ug | PREFILLED_SYRINGE | Freq: Once | SUBCUTANEOUS | Status: AC
  Administered 2013-12-26: 300 ug via SUBCUTANEOUS
  Filled 2013-12-26: qty 0.5

## 2013-12-29 ENCOUNTER — Telehealth: Payer: Self-pay | Admitting: *Deleted

## 2013-12-29 NOTE — Telephone Encounter (Signed)
Pt called & left message to call back.  Returned call & pt reports that she talked with the on-call nurse over the weekend & was told to update [place].  She states she had 2 issues, one being feeling pressure like she is going to have a BM but didn't.  She reports have 3 BM's after her treatment which were semi-hard & caused her hemorrhoids to bleed & she was taking medication for constipation.  She states the next day she had gas & had a couple of episodes if stool on her kotex  & not realizing that she had done this & this is really concerning to her. The second issue was that she vomited after eating a good meal that she had prepared & vomited last hs from 9 p to 12:30 pm.  She took her zofran but vomited it up.  She denies any fever. She feels better today.  She thinks she may have just had a bug.  Instructed to talk with the radiation nurses tomorrow but probably her bowel symptoms are related to the radiation.  She is comfortable with this.

## 2013-12-29 NOTE — Telephone Encounter (Signed)
CALLED PATIENT TO REMIND OF HDR TX. FOR 12-30-13 @ 1:00 PM, SPOKE WITH PATIENT AND SHE IS AWARE OF THIS APPT.

## 2013-12-30 ENCOUNTER — Ambulatory Visit
Admission: RE | Admit: 2013-12-30 | Discharge: 2013-12-30 | Disposition: A | Discharge status: Home / Self Care | Payer: Medicare Other | Source: Ambulatory Visit | Attending: Radiation Oncology | Admitting: Radiation Oncology

## 2013-12-30 DIAGNOSIS — Z803 Family history of malignant neoplasm of breast: Secondary | ICD-10-CM | POA: Diagnosis not present

## 2013-12-30 DIAGNOSIS — Z90710 Acquired absence of both cervix and uterus: Secondary | ICD-10-CM | POA: Diagnosis not present

## 2013-12-30 DIAGNOSIS — C549 Malignant neoplasm of corpus uteri, unspecified: Secondary | ICD-10-CM | POA: Diagnosis not present

## 2013-12-30 DIAGNOSIS — Z510 Encounter for antineoplastic radiation therapy: Secondary | ICD-10-CM | POA: Diagnosis not present

## 2013-12-30 DIAGNOSIS — Z9079 Acquired absence of other genital organ(s): Secondary | ICD-10-CM | POA: Diagnosis not present

## 2013-12-30 DIAGNOSIS — C541 Malignant neoplasm of endometrium: Secondary | ICD-10-CM

## 2013-12-30 DIAGNOSIS — M810 Age-related osteoporosis without current pathological fracture: Secondary | ICD-10-CM | POA: Diagnosis not present

## 2013-12-30 NOTE — Progress Notes (Addendum)
  Department of Radiation Oncology  Phone: [phone]   12/30/2013  High-dose-rate brachytherapy procedure note   Endometrial cancer  Primary site: Corpus Uteri - Carcinoma  Staging method: AJCC 7th Edition  Clinical: Stage IA (T1a, N0, M0)  Pathologic: Stage IA (T1a, N0, M0)  Summary: Stage IA (T1a, N0, M0)   Simple treatment device note   Today the patient had construction of her custom vaginal cylinder to be used for high-dose-rate treatment. the optimal arrangement was three 3.0 cm rings placed proximally within the vagina and two 2.5 cm rings placed distally within the vaginal vault.    Vaginal brachytherapy procedure Note   Patient was placed in the dorsolithotomy position on the high-dose-rate treatment table. Pelvic exam was performed revealing the vaginal cuff to be intact. A speculum exam was performed again since the patient has noticed some scant vaginal spotting after her last treatment. There was some mild erythema to the vaginal cuff but no other issues. Patient also developed severe left lower quadrant pain followed by nausea and emesis since her last treatment. Today she is feeling much better. She is also had some bowel urgency which is been bothersome to her. She denies any rectal bleeding. She has noticed some mild mucous production from the rectum area. She is  receiving chemotherapy in addition to her vaginal brachytherapy treatments. She will let us know if this issue continues to be a problem for her.   Patient proceeded to have placement of her vaginal cylinder into the proximal vagina. The vaginal cylinder was affixed to the CT/MR stabilization plate to prevent slippage. Patient tolerated the procedure well.   Verification simulation   A fiducial marker was placed within the vaginal cylinder. An AP and lateral film was obtained which verified accurate placement of the vaginal cylinder for treatment.   High-dose-rate brachytherapy procedure note   The remote  afterloader was attached to the vaginal cylinder by a catheter. The patient proceeded to undergo her fourth high-dose-rate treatment. She was prescribed a dose of 6 gray to be delivered to the proximal vaginal mucosal surface. This was achieved using 1 channel with 9 dwell positions. Total treatment time was 313.3 seconds. Patient tolerated the procedure well. After completion of her therapy a radiation survey was performed documenting return of the iridium source into the Nucletron safe. The patient will return next week for her fifth treatment.   Blair Promise M.D.

## 2014-01-04 ENCOUNTER — Other Ambulatory Visit: Payer: Self-pay | Admitting: Oncology

## 2014-01-05 ENCOUNTER — Other Ambulatory Visit: Payer: Medicare Other

## 2014-01-05 ENCOUNTER — Encounter: Payer: Self-pay | Admitting: Oncology

## 2014-01-05 ENCOUNTER — Ambulatory Visit (HOSPITAL_BASED_OUTPATIENT_CLINIC_OR_DEPARTMENT_OTHER): Payer: Medicare Other | Admitting: Oncology

## 2014-01-05 ENCOUNTER — Other Ambulatory Visit (HOSPITAL_BASED_OUTPATIENT_CLINIC_OR_DEPARTMENT_OTHER): Payer: Medicare Other

## 2014-01-05 ENCOUNTER — Telehealth: Payer: Self-pay | Admitting: *Deleted

## 2014-01-05 VITALS — BP 111/74 | HR 78 | Temp 98.8°F | Resp 18 | Ht 63.0 in | Wt 104.4 lb

## 2014-01-05 DIAGNOSIS — C549 Malignant neoplasm of corpus uteri, unspecified: Secondary | ICD-10-CM | POA: Diagnosis not present

## 2014-01-05 DIAGNOSIS — C541 Malignant neoplasm of endometrium: Secondary | ICD-10-CM

## 2014-01-05 DIAGNOSIS — K648 Other hemorrhoids: Secondary | ICD-10-CM | POA: Diagnosis not present

## 2014-01-05 LAB — COMPREHENSIVE METABOLIC PANEL (CC13)
ALT: 24 U/L (ref 0–55)
AST: 19 U/L (ref 5–34)
Albumin: 3.6 g/dL (ref 3.5–5.0)
Alkaline Phosphatase: 77 U/L (ref 40–150)
Anion Gap: 7 mEq/L (ref 3–11)
BUN: 15.4 mg/dL (ref 7.0–26.0)
CO2: 27 mEq/L (ref 22–29)
Calcium: 9.1 mg/dL (ref 8.4–10.4)
Chloride: 104 mEq/L (ref 98–109)
Creatinine: 0.7 mg/dL (ref 0.6–1.1)
Glucose: 101 mg/dl (ref 70–140)
Potassium: 3.9 mEq/L (ref 3.5–5.1)
Sodium: 138 mEq/L (ref 136–145)
Total Bilirubin: 0.26 mg/dL (ref 0.20–1.20)
Total Protein: 6.9 g/dL (ref 6.4–8.3)

## 2014-01-05 LAB — CBC WITH DIFFERENTIAL/PLATELET
BASO%: 1.2 % (ref 0.0–2.0)
Basophils Absolute: 0 10*3/uL (ref 0.0–0.1)
EOS%: 2 % (ref 0.0–7.0)
Eosinophils Absolute: 0.1 10*3/uL (ref 0.0–0.5)
HCT: 32.2 % — ABNORMAL LOW (ref 34.8–46.6)
HGB: 10.6 g/dL — ABNORMAL LOW (ref 11.6–15.9)
LYMPH%: 29.4 % (ref 14.0–49.7)
MCH: 32 pg (ref 25.1–34.0)
MCHC: 32.8 g/dL (ref 31.5–36.0)
MCV: 97.7 fL (ref 79.5–101.0)
MONO#: 0.3 10*3/uL (ref 0.1–0.9)
MONO%: 11.6 % (ref 0.0–14.0)
NEUT#: 1.7 10*3/uL (ref 1.5–6.5)
NEUT%: 55.8 % (ref 38.4–76.8)
Platelets: 201 10*3/uL (ref 145–400)
RBC: 3.3 10*6/uL — ABNORMAL LOW (ref 3.70–5.45)
RDW: 14.9 % — ABNORMAL HIGH (ref 11.2–14.5)
WBC: 3 10*3/uL — ABNORMAL LOW (ref 3.9–10.3)
lymph#: 0.9 10*3/uL (ref 0.9–3.3)

## 2014-01-05 MED ORDER — DEXAMETHASONE 4 MG PO TABS: ORAL_TABLET | ORAL | Status: DC

## 2014-01-05 MED ORDER — HYDROCORTISONE ACE-PRAMOXINE 1-1 % RE FOAM: 1.0000 | Freq: Two times a day (BID) | RECTAL | Status: DC | PRN

## 2014-01-05 NOTE — Telephone Encounter (Signed)
Called patient to remind of tx. For 01-06-14, spoke with patient's husband - [name] and they are aware of this appt.

## 2014-01-05 NOTE — Progress Notes (Signed)
OFFICE PROGRESS NOTE   01/05/2014   Physicians:Soper, John,Tower, Waverly; Constant, Peggy; Kinard, Jeneen Rinks; Laurann Montana, [facility]); Mellody Drown (gyn onc [facility]);Kaplan, Carman Ching, Josph Macho   INTERVAL HISTORY:  Patiient is seen, alone for visit, in continuing attention to adjuvant chemotherapy in process for high grade serous T1aN0 endometrial carcinoma. She is due cycle 4 taxol carboplatin on 01-09-14; last HDR is planned 01-06-14.   She had episode of acute LLQ pain followed by 3 hours of vomiting weekend of 12-27-13; she had been somewhat constipated and had some low abdominal pressure prior to the more acute symptoms. Symptoms all resolved entirely without other intervention and have not recurred.  She is otherwise feeling generally well now "the first week is bad, then 2 good weeks", with good appetite and good po intake, no abdominal or pelvic discomfort now, no change in urinary frequency that was present prior to this diagnosis, no bleeding. Bowels moved well this AM then a few times more for small amounts. Hemorrhoids are not obviously symptomatic now.  She does not have PAC.   Twin grandchildren in [place] will have first birthday party on 6-20, which she thinks she will feel well enough to attend (day after chemo) and does not want the chemo moved because of this. She appreciated conversation with [place] chaplain.   ONCOLOGIC HISTORY Patient had been in usual good health until she noticed vaginal spotting in Feb 2015. She was seen by PCP Dr Glori Bickers, with endometrial biopsy 09-17-13 by Dr Elly Modena [phone]) endometrial adenocarcinoma, which appeared to be grade II. She had pelvic and transvaginal US 09-23-13. She was seen in consultation by Dr Josephina Shih, then referred to Dr Clarene Essex at [facility] for earliest surgical availability. She had preoperative CXR at [facility]. Surgery 10-03-13 was robotic assisted total laparoscopic hysterectomy/ BSO/ bilateral pelvic and para-aortic nodes with sentinel  node evaluation, tolerated well and patient discharged home on POD #1. [facility] surgical pathology [phone]) showed serous carcinoma FIGO grade 3 without myometrial invasion, no adnexal, serosal or cervical involvement, no LVSI, 0/27 nodes involved (9 para-aortic, 18 pelvic), pT1a N0. She saw Dr Clarene Essex in follow up on 10-05-13, with recommendation for 6 cycles of adjuvant carboplatin and taxol chemotherapy and brachytherapy; she also had consultation with radiation oncology at [facility]. Chemotherapy education was done at [facility] 10-16-13. She had second opinion consultation by Dr Fransisca Connors at [facility]. First taxol carboplatin was given 11-06-13. Brachytherapy began 12-09-13 concomitant with chemotherapy.  Review of systems as above, also: No fever or symptoms of infection. No SOB or cough. Scalp is better with tar shampoo and oil from dermatologist.  Remainder of 10 point Review of Systems negative.  Objective:  Vital signs in last 24 hours:  BP 111/74  Pulse 78  Temp(Src) 98.8 F (37.1 C) (Oral)  Resp 18  Ht 5\' 3"  (1.6 m)  Wt 104 lb 6.4 oz (47.356 kg)  BMI 18.50 kg/m2  LMP 09/17/2000 Weight is stable. Alert, oriented and appropriate. Ambulatory without difficulty.  Alopecia  HEENT:PERRL, sclerae not icteric. Oral mucosa moist without lesions, posterior pharynx clear.  Neck supple. No JVD.  Lymphatics:no cervical,suraclavicular, axillary or inguinal adenopathy Resp: clear to auscultation bilaterally and normal percussion bilaterally Cardio: regular rate and rhythm. No gallop. GI: soft, nontender including LLQ, not distended, no mass or organomegaly. Normally active bowel sounds. Surgical incision not remarkable. Musculoskeletal/ Extremities: without pitting edema, cords, tenderness Neuro: no peripheral neuropathy. Otherwise nonfocal Skin without rash, ecchymosis, petechiae   Lab Results:  Results for orders  placed in visit on 01/05/14  CBC WITH DIFFERENTIAL      Result Value Ref  Range   WBC 3.0 (*) 3.9 - 10.3 10e3/uL   NEUT# 1.7  1.5 - 6.5 10e3/uL   HGB 10.6 (*) 11.6 - 15.9 g/dL   HCT 32.2 (*) 34.8 - 46.6 %   Platelets 201  145 - 400 10e3/uL   MCV 97.7  79.5 - 101.0 fL   MCH 32.0  25.1 - 34.0 pg   MCHC 32.8  31.5 - 36.0 g/dL   RBC 3.30 (*) 3.70 - 5.45 10e6/uL   RDW 14.9 (*) 11.2 - 14.5 %   lymph# 0.9  0.9 - 3.3 10e3/uL   MONO# 0.3  0.1 - 0.9 10e3/uL   Eosinophils Absolute 0.1  0.0 - 0.5 10e3/uL   Basophils Absolute 0.0  0.0 - 0.1 10e3/uL   NEUT% 55.8  38.4 - 76.8 %   LYMPH% 29.4  14.0 - 49.7 %   MONO% 11.6  0.0 - 14.0 %   EOS% 2.0  0.0 - 7.0 %   BASO% 1.2  0.0 - 2.0 %  COMPREHENSIVE METABOLIC PANEL (NW29)      Result Value Ref Range   Sodium 138  136 - 145 mEq/L   Potassium 3.9  3.5 - 5.1 mEq/L   Chloride 104  98 - 109 mEq/L   CO2 27  22 - 29 mEq/L   Glucose 101  70 - 140 mg/dl   BUN 15.4  7.0 - 26.0 mg/dL   Creatinine 0.7  0.6 - 1.1 mg/dL   Total Bilirubin 0.26  0.20 - 1.20 mg/dL   Alkaline Phosphatase 77  40 - 150 U/L   AST 19  5 - 34 U/L   ALT 24  0 - 55 U/L   Total Protein 6.9  6.4 - 8.3 g/dL   Albumin 3.6  3.5 - 5.0 g/dL   Calcium 9.1  8.4 - 10.4 mg/dL   Anion Gap 7  3 - 11 mEq/L    Discussed that ANC is likely a little lower with combined RT and chemo. We will recheck CBC on 6-19 prior to treatment, and will use neupogen/ granix as previously.  Studies/Results:  No results found.  Medications: I have reviewed the patient's current medications. Will add proctofoam HC 1-2x daily for ~ a week around chemo. She understands that percocet and possibly zofran can cause some constipation.    Assessment/Plan:  1.IA high grade serous endometrial carcinoma: post TAH/BSO/ pelvic and para-aortic node evaluation at [facility] 10-03-13 and first taxol carbo on 11-06-13. Due cycle 4 on 01-09-14 as long as [place] >=1.5 and plt >=100k day of treatment, gCSF x3 beginning 01-14-14. HDR to complete 01-06-14. I will see her again with labs ~ July 6. 2.family history of  breast and ovarian cancer, Ashkenazi Jewish descent. Genetics testing sent and pending  3.single episode of self-limited LLQ pain and vomiting. History suggests brief bowel kink or obstruction. Asymptomatic now. 4. psoriasis: scalp improved  5.possible allergic reaction to sulfa, which we will avoid. 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  7.internal hemorrhoids at recent colonoscopy. Try proctofoam  HC for ~ a week around chemo. 8.osteoporosis: ok to hold Fosamax if nausea from chemo  Patient has had all questions answered to her satisfaction and is  in agreement with plan above.    Charlisa Cham P, MD   01/05/2014, 12:40 PM

## 2014-01-06 ENCOUNTER — Ambulatory Visit
Admission: RE | Admit: 2014-01-06 | Discharge: 2014-01-06 | Disposition: A | Discharge status: Home / Self Care | Payer: Medicare Other | Source: Ambulatory Visit | Attending: Radiation Oncology | Admitting: Radiation Oncology

## 2014-01-06 DIAGNOSIS — Z803 Family history of malignant neoplasm of breast: Secondary | ICD-10-CM | POA: Diagnosis not present

## 2014-01-06 DIAGNOSIS — Z510 Encounter for antineoplastic radiation therapy: Secondary | ICD-10-CM | POA: Diagnosis not present

## 2014-01-06 DIAGNOSIS — Z90710 Acquired absence of both cervix and uterus: Secondary | ICD-10-CM | POA: Diagnosis not present

## 2014-01-06 DIAGNOSIS — C541 Malignant neoplasm of endometrium: Secondary | ICD-10-CM

## 2014-01-06 DIAGNOSIS — Z9079 Acquired absence of other genital organ(s): Secondary | ICD-10-CM | POA: Diagnosis not present

## 2014-01-06 DIAGNOSIS — C549 Malignant neoplasm of corpus uteri, unspecified: Secondary | ICD-10-CM | POA: Diagnosis not present

## 2014-01-06 DIAGNOSIS — M810 Age-related osteoporosis without current pathological fracture: Secondary | ICD-10-CM | POA: Diagnosis not present

## 2014-01-06 NOTE — Progress Notes (Signed)
  Department of Radiation Oncology  Phone: [phone]   01/06/2014  High-dose-rate brachytherapy procedure note   Endometrial cancer  Primary site: Corpus Uteri - Carcinoma  Staging method: AJCC 7th Edition  Clinical: Stage IA (T1a, N0, M0)  Pathologic: Stage IA (T1a, N0, M0)  Summary: Stage IA (T1a, N0, M0)   Simple treatment device note  Today the patient had construction of her custom vaginal cylinder to be used for high-dose-rate treatment. the optimal arrangement was three 3.0 cm rings placed proximally within the vagina and two 2.5 cm rings placed distally within the vaginal vault.    Vaginal brachytherapy procedure Note   Patient was placed in the dorsolithotomy position on the high-dose-rate treatment table. Pelvic exam was performed revealing the vaginal cuff to be intact Today she is feeling much better.  She denies any rectal bleeding or hematuria.  She did not have any problems after her last HDR treatment. She denies any more problems with abdominal pain. She is receiving chemotherapy in addition to her vaginal brachytherapy treatments. Patient proceeded to have placement of her vaginal cylinder into the proximal vagina. The vaginal cylinder was affixed to the CT/MR stabilization plate to prevent slippage. Patient tolerated the procedure well.   Verification simulation   A fiducial marker was placed within the vaginal cylinder. An AP and lateral film was obtained which verified accurate placement of the vaginal cylinder for treatment.   High-dose-rate brachytherapy procedure note   The remote afterloader was attached to the vaginal cylinder by a catheter. The patient proceeded to undergo her fifth high-dose-rate treatment. She was prescribed a dose of 6 gray to be delivered to the proximal vaginal mucosal surface. This was achieved using 1 channel with 9 dwell positions. Total treatment time was 334.4 seconds. Patient tolerated the procedure well. After completion of her  therapy a radiation survey was performed documenting return of the iridium source into the Nucletron safe. The patient will return for routine followup in one month. She will continue her adjuvant chemotherapy.  Blair Promise M.D.

## 2014-01-07 ENCOUNTER — Telehealth: Payer: Self-pay

## 2014-01-07 ENCOUNTER — Encounter: Payer: Self-pay | Admitting: Genetic Counselor

## 2014-01-07 ENCOUNTER — Telehealth: Payer: Self-pay | Admitting: Oncology

## 2014-01-07 NOTE — Telephone Encounter (Signed)
Per MD  Devashwan request, faxed lab results.  Sent so scan.

## 2014-01-07 NOTE — Progress Notes (Signed)
Referring Physician: Evlyn Clines, MD   Veronica Ward was called today to discuss genetic test results. Please see the Genetics note from her visit on 12/10/13 for a detailed discussion of her personal and family history.  GENETIC TESTING: At the time of Veronica Ward's visit, we recommended she pursue genetic testing of multiple genes on the OvaNext gene panel. This panel was selected to address the concern that she has a BRCA mutation (she is of Ashkenazi Jewish ancestry) as well as rule out Lynch syndrome due to her personal and family history. This test, which included sequencing and deletion/duplication analysis of 24 genes, was performed at [facility]. Testing was normal and did not reveal a mutation in these genes. The genes tested were ATM, BARD1, BRCA1, BRCA2, BRIP1, CDH1, CHEK2, EPCAM, MLH1, MRE11A, MSH2, MSH6, MUTYH, NBN, NF1, PALB2, PMS2, PTEN, RAD50, RAD51C, RAD51D, SMARCA4, STK11, and TP53.  We discussed with Veronica Ward that since the current test is not perfect, it is possible there may be a gene mutation that current testing cannot detect, but that chance is small. We also discussed that it is possible that a different genetic factor, which was not part of this testing or has not yet been discovered, is responsible for the cancer diagnoses in the family.   CANCER SCREENING: This result suggests that Veronica Ward cancer was most likely not due to an inherited predisposition. Most cancers happen by chance and this negative test, along with details of her family history, suggests that her cancer falls into this category. We, therefore, recommended she continue to follow the cancer screening guidelines provided by her physician.   FAMILY MEMBERS: Given the Jewish ancestry of Veronica Ward family, it is recommended her sister (unaffected at age 8) pursue BRCA testing.  Please let us know if we can help facilitate testing. Genetic counselors can be located in other cities, by visiting the website of the  [employer] ([email or web address]) and Field seismologist for a Dietitian by zip code.    Lastly, we discussed with Veronica Ward that cancer genetics is a rapidly advancing field and it is possible that new genetic tests will be appropriate for her in the future. We encouraged her to remain in contact with [place] on an annual basis so we can update her personal and family histories, and let her know of advances in cancer genetics that may benefit the family. Our contact number was provided. Veronica Ward questions were answered to her satisfaction today, and she knows she is welcome to call anytime with additional questions.    Steele Berg, MS, [place] Certified Genetic Counseor phone: [phone] [email or web address]

## 2014-01-07 NOTE — Telephone Encounter (Signed)
, °

## 2014-01-08 ENCOUNTER — Telehealth: Payer: Self-pay | Admitting: *Deleted

## 2014-01-08 ENCOUNTER — Other Ambulatory Visit: Payer: Self-pay

## 2014-01-08 DIAGNOSIS — C541 Malignant neoplasm of endometrium: Secondary | ICD-10-CM

## 2014-01-08 NOTE — Telephone Encounter (Signed)
Per staff message and POF I have scheduled appts. Advised scheduler of appts. JMW  

## 2014-01-09 ENCOUNTER — Other Ambulatory Visit (HOSPITAL_BASED_OUTPATIENT_CLINIC_OR_DEPARTMENT_OTHER): Payer: Medicare Other

## 2014-01-09 ENCOUNTER — Encounter: Payer: Self-pay | Admitting: Specialist

## 2014-01-09 ENCOUNTER — Ambulatory Visit (HOSPITAL_BASED_OUTPATIENT_CLINIC_OR_DEPARTMENT_OTHER): Payer: Medicare Other

## 2014-01-09 VITALS — BP 110/50 | HR 89 | Temp 98.4°F | Resp 19

## 2014-01-09 DIAGNOSIS — C541 Malignant neoplasm of endometrium: Secondary | ICD-10-CM

## 2014-01-09 DIAGNOSIS — C549 Malignant neoplasm of corpus uteri, unspecified: Secondary | ICD-10-CM | POA: Diagnosis not present

## 2014-01-09 DIAGNOSIS — Z5111 Encounter for antineoplastic chemotherapy: Secondary | ICD-10-CM | POA: Diagnosis not present

## 2014-01-09 LAB — CBC WITH DIFFERENTIAL/PLATELET
BASO%: 0.2 % (ref 0.0–2.0)
Basophils Absolute: 0 10*3/uL (ref 0.0–0.1)
EOS%: 0 % (ref 0.0–7.0)
Eosinophils Absolute: 0 10*3/uL (ref 0.0–0.5)
HCT: 33.9 % — ABNORMAL LOW (ref 34.8–46.6)
HGB: 11.2 g/dL — ABNORMAL LOW (ref 11.6–15.9)
LYMPH%: 7.1 % — ABNORMAL LOW (ref 14.0–49.7)
MCH: 32.2 pg (ref 25.1–34.0)
MCHC: 33 g/dL (ref 31.5–36.0)
MCV: 97.5 fL (ref 79.5–101.0)
MONO#: 0 10*3/uL — ABNORMAL LOW (ref 0.1–0.9)
MONO%: 0.9 % (ref 0.0–14.0)
NEUT#: 4.3 10*3/uL (ref 1.5–6.5)
NEUT%: 91.8 % — ABNORMAL HIGH (ref 38.4–76.8)
Platelets: 227 10*3/uL (ref 145–400)
RBC: 3.48 10*6/uL — ABNORMAL LOW (ref 3.70–5.45)
RDW: 15.1 % — ABNORMAL HIGH (ref 11.2–14.5)
WBC: 4.7 10*3/uL (ref 3.9–10.3)
lymph#: 0.3 10*3/uL — ABNORMAL LOW (ref 0.9–3.3)

## 2014-01-09 LAB — COMPREHENSIVE METABOLIC PANEL (CC13)
ALT: 22 U/L (ref 0–55)
AST: 19 U/L (ref 5–34)
Albumin: 3.8 g/dL (ref 3.5–5.0)
Alkaline Phosphatase: 70 U/L (ref 40–150)
Anion Gap: 9 mEq/L (ref 3–11)
BUN: 18.8 mg/dL (ref 7.0–26.0)
CO2: 24 mEq/L (ref 22–29)
Calcium: 9.9 mg/dL (ref 8.4–10.4)
Chloride: 105 mEq/L (ref 98–109)
Creatinine: 0.7 mg/dL (ref 0.6–1.1)
Glucose: 187 mg/dl — ABNORMAL HIGH (ref 70–140)
Potassium: 4.1 mEq/L (ref 3.5–5.1)
Sodium: 138 mEq/L (ref 136–145)
Total Bilirubin: 0.26 mg/dL (ref 0.20–1.20)
Total Protein: 7.4 g/dL (ref 6.4–8.3)

## 2014-01-09 MED ORDER — SODIUM CHLORIDE 0.9 % IV SOLN
Freq: Once | INTRAVENOUS | Status: AC
  Administered 2014-01-09: 12:00:00 via INTRAVENOUS

## 2014-01-09 MED ORDER — FAMOTIDINE IN NACL 20-0.9 MG/50ML-% IV SOLN
20.0000 mg | Freq: Once | INTRAVENOUS | Status: AC
  Administered 2014-01-09: 20 mg via INTRAVENOUS

## 2014-01-09 MED ORDER — ONDANSETRON 16 MG/50ML IVPB (CHCC)
INTRAVENOUS | Status: AC
  Filled 2014-01-09: qty 16

## 2014-01-09 MED ORDER — DEXAMETHASONE SODIUM PHOSPHATE 20 MG/5ML IJ SOLN
20.0000 mg | Freq: Once | INTRAMUSCULAR | Status: AC
  Administered 2014-01-09: 20 mg via INTRAVENOUS

## 2014-01-09 MED ORDER — DIPHENHYDRAMINE HCL 50 MG/ML IJ SOLN
12.5000 mg | Freq: Once | INTRAMUSCULAR | Status: AC
  Administered 2014-01-09: 12.5 mg via INTRAVENOUS

## 2014-01-09 MED ORDER — SODIUM CHLORIDE 0.9 % IV SOLN
322.0000 mg | Freq: Once | INTRAVENOUS | Status: AC
  Administered 2014-01-09: 320 mg via INTRAVENOUS
  Filled 2014-01-09: qty 32

## 2014-01-09 MED ORDER — LORAZEPAM 1 MG PO TABS
ORAL_TABLET | ORAL | Status: AC
  Filled 2014-01-09: qty 1

## 2014-01-09 MED ORDER — ONDANSETRON 16 MG/50ML IVPB (CHCC)
16.0000 mg | Freq: Once | INTRAVENOUS | Status: AC
  Administered 2014-01-09: 16 mg via INTRAVENOUS

## 2014-01-09 MED ORDER — DEXAMETHASONE SODIUM PHOSPHATE 20 MG/5ML IJ SOLN
INTRAMUSCULAR | Status: AC
  Filled 2014-01-09: qty 5

## 2014-01-09 MED ORDER — FAMOTIDINE IN NACL 20-0.9 MG/50ML-% IV SOLN
INTRAVENOUS | Status: AC
  Filled 2014-01-09: qty 50

## 2014-01-09 MED ORDER — PACLITAXEL CHEMO INJECTION 300 MG/50ML
175.0000 mg/m2 | Freq: Once | INTRAVENOUS | Status: AC
  Administered 2014-01-09: 252 mg via INTRAVENOUS
  Filled 2014-01-09: qty 42

## 2014-01-09 MED ORDER — LORAZEPAM 1 MG PO TABS
0.5000 mg | ORAL_TABLET | Freq: Once | ORAL | Status: AC | PRN
  Administered 2014-01-09: 0.5 mg via ORAL

## 2014-01-09 MED ORDER — DIPHENHYDRAMINE HCL 50 MG/ML IJ SOLN
INTRAMUSCULAR | Status: AC
  Filled 2014-01-09: qty 1

## 2014-01-09 NOTE — Patient Instructions (Signed)
[facility] Discharge Instructions for Patients Receiving Chemotherapy  Today you received the following chemotherapy agents: Taxol/Carboplatin  To help prevent nausea and vomiting after your treatment, we encourage you to take your nausea medication as prescribed by your physician.   If you develop nausea and vomiting that is not controlled by your nausea medication, call the clinic.   BELOW ARE SYMPTOMS THAT SHOULD BE REPORTED IMMEDIATELY:  *FEVER GREATER THAN 100.5 F  *CHILLS WITH OR WITHOUT FEVER  NAUSEA AND VOMITING THAT IS NOT CONTROLLED WITH YOUR NAUSEA MEDICATION  *UNUSUAL SHORTNESS OF BREATH  *UNUSUAL BRUISING OR BLEEDING  TENDERNESS IN MOUTH AND THROAT WITH OR WITHOUT PRESENCE OF ULCERS  *URINARY PROBLEMS  *BOWEL PROBLEMS  UNUSUAL RASH Items with * indicate a potential emergency and should be followed up as soon as possible.  Feel free to call the clinic you have any questions or concerns. The clinic phone number is [phone].    

## 2014-01-09 NOTE — Progress Notes (Signed)
Lengthy visit for ongoing support. Patient finding hope and meaning in her work. She talked about existential and identity themes. Empathetic listening and support.  Epifania Gore, PhD, [place]

## 2014-01-14 ENCOUNTER — Ambulatory Visit (HOSPITAL_BASED_OUTPATIENT_CLINIC_OR_DEPARTMENT_OTHER): Payer: Medicare Other

## 2014-01-14 ENCOUNTER — Ambulatory Visit (HOSPITAL_BASED_OUTPATIENT_CLINIC_OR_DEPARTMENT_OTHER): Payer: BC Managed Care – PPO

## 2014-01-14 ENCOUNTER — Other Ambulatory Visit: Payer: Self-pay

## 2014-01-14 VITALS — BP 138/61 | HR 77 | Temp 98.7°F | Resp 16

## 2014-01-14 VITALS — BP 114/59 | HR 79 | Temp 98.5°F

## 2014-01-14 DIAGNOSIS — Z5189 Encounter for other specified aftercare: Secondary | ICD-10-CM

## 2014-01-14 DIAGNOSIS — C541 Malignant neoplasm of endometrium: Secondary | ICD-10-CM

## 2014-01-14 DIAGNOSIS — E860 Dehydration: Secondary | ICD-10-CM

## 2014-01-14 DIAGNOSIS — C549 Malignant neoplasm of corpus uteri, unspecified: Secondary | ICD-10-CM

## 2014-01-14 MED ORDER — TBO-FILGRASTIM 300 MCG/0.5ML ~~LOC~~ SOSY
300.0000 ug | PREFILLED_SYRINGE | Freq: Once | SUBCUTANEOUS | Status: AC
  Administered 2014-01-14: 300 ug via SUBCUTANEOUS
  Filled 2014-01-14: qty 0.5

## 2014-01-14 MED ORDER — ONDANSETRON 8 MG/50ML IVPB (CHCC)
8.0000 mg | Freq: Once | INTRAVENOUS | Status: AC
  Administered 2014-01-14: 8 mg via INTRAVENOUS

## 2014-01-14 MED ORDER — SODIUM CHLORIDE 0.9 % IV SOLN
Freq: Once | INTRAVENOUS | Status: AC
  Administered 2014-01-14: 13:00:00 via INTRAVENOUS

## 2014-01-14 MED ORDER — HYDROCODONE-ACETAMINOPHEN 5-325 MG PO TABS
ORAL_TABLET | ORAL | Status: AC
  Filled 2014-01-14: qty 1

## 2014-01-14 MED ORDER — HYDROCODONE-ACETAMINOPHEN 5-325 MG PO TABS
1.0000 | ORAL_TABLET | Freq: Once | ORAL | Status: AC
  Administered 2014-01-14: 1 via ORAL

## 2014-01-14 MED ORDER — ONDANSETRON 8 MG/NS 50 ML IVPB
INTRAVENOUS | Status: AC
  Filled 2014-01-14: qty 8

## 2014-01-14 NOTE — Progress Notes (Signed)
Complaining of feeling weak, dizzy, slight SOB, slight nausea,bloated, and achy.  Has been drinking some but not a lot.  Tastes are bad, can't tolerate sweet things.   BP lying  97/56-70,   Sitting  110/59-80   Standing  106/65-105. Milas Hock, RN with Dr Marko Plume.

## 2014-01-15 ENCOUNTER — Telehealth: Payer: Self-pay

## 2014-01-15 ENCOUNTER — Ambulatory Visit (HOSPITAL_BASED_OUTPATIENT_CLINIC_OR_DEPARTMENT_OTHER): Payer: Medicare Other

## 2014-01-15 VITALS — BP 109/54 | HR 80 | Temp 99.1°F

## 2014-01-15 DIAGNOSIS — C541 Malignant neoplasm of endometrium: Secondary | ICD-10-CM

## 2014-01-15 DIAGNOSIS — Z5189 Encounter for other specified aftercare: Secondary | ICD-10-CM | POA: Diagnosis not present

## 2014-01-15 DIAGNOSIS — C549 Malignant neoplasm of corpus uteri, unspecified: Secondary | ICD-10-CM

## 2014-01-15 MED ORDER — TBO-FILGRASTIM 300 MCG/0.5ML ~~LOC~~ SOSY
300.0000 ug | PREFILLED_SYRINGE | Freq: Once | SUBCUTANEOUS | Status: AC
  Administered 2014-01-15: 300 ug via SUBCUTANEOUS
  Filled 2014-01-15: qty 0.5

## 2014-01-15 NOTE — Telephone Encounter (Signed)
Veronica Ward is currently  receiving the granix injections for her WBC's.   Her temp is 99.6.  Told her it was fine to take an advil and go to bed as she is tired.  She is to call if temp 100.5 or greater.  Told her that the injection can cause a rise in temp because the immune system is being stimulated. Patient verbalized understanding.

## 2014-01-16 ENCOUNTER — Ambulatory Visit (HOSPITAL_BASED_OUTPATIENT_CLINIC_OR_DEPARTMENT_OTHER): Payer: Medicare Other

## 2014-01-16 VITALS — BP 115/48 | HR 82 | Temp 99.6°F

## 2014-01-16 DIAGNOSIS — C541 Malignant neoplasm of endometrium: Secondary | ICD-10-CM

## 2014-01-16 DIAGNOSIS — Z5189 Encounter for other specified aftercare: Secondary | ICD-10-CM

## 2014-01-16 DIAGNOSIS — C549 Malignant neoplasm of corpus uteri, unspecified: Secondary | ICD-10-CM

## 2014-01-16 MED ORDER — TBO-FILGRASTIM 300 MCG/0.5ML ~~LOC~~ SOSY
300.0000 ug | PREFILLED_SYRINGE | Freq: Once | SUBCUTANEOUS | Status: AC
  Administered 2014-01-16: 300 ug via SUBCUTANEOUS
  Filled 2014-01-16: qty 0.5

## 2014-01-25 ENCOUNTER — Other Ambulatory Visit: Payer: Self-pay | Admitting: Oncology

## 2014-01-25 DIAGNOSIS — C541 Malignant neoplasm of endometrium: Secondary | ICD-10-CM

## 2014-01-26 ENCOUNTER — Ambulatory Visit (HOSPITAL_BASED_OUTPATIENT_CLINIC_OR_DEPARTMENT_OTHER): Payer: Medicare Other | Admitting: Oncology

## 2014-01-26 ENCOUNTER — Encounter: Payer: Self-pay | Admitting: Oncology

## 2014-01-26 ENCOUNTER — Other Ambulatory Visit (HOSPITAL_BASED_OUTPATIENT_CLINIC_OR_DEPARTMENT_OTHER): Payer: Medicare Other

## 2014-01-26 VITALS — BP 117/77 | HR 77 | Temp 98.4°F | Resp 18 | Ht 63.0 in | Wt 104.2 lb

## 2014-01-26 DIAGNOSIS — C549 Malignant neoplasm of corpus uteri, unspecified: Secondary | ICD-10-CM

## 2014-01-26 DIAGNOSIS — M810 Age-related osteoporosis without current pathological fracture: Secondary | ICD-10-CM | POA: Diagnosis not present

## 2014-01-26 DIAGNOSIS — C541 Malignant neoplasm of endometrium: Secondary | ICD-10-CM

## 2014-01-26 LAB — CBC WITH DIFFERENTIAL/PLATELET
BASO%: 0.7 % (ref 0.0–2.0)
Basophils Absolute: 0 10*3/uL (ref 0.0–0.1)
EOS%: 1.7 % (ref 0.0–7.0)
Eosinophils Absolute: 0.1 10*3/uL (ref 0.0–0.5)
HCT: 32.1 % — ABNORMAL LOW (ref 34.8–46.6)
HGB: 10.7 g/dL — ABNORMAL LOW (ref 11.6–15.9)
LYMPH%: 28.3 % (ref 14.0–49.7)
MCH: 32.4 pg (ref 25.1–34.0)
MCHC: 33.3 g/dL (ref 31.5–36.0)
MCV: 97.3 fL (ref 79.5–101.0)
MONO#: 0.3 10*3/uL (ref 0.1–0.9)
MONO%: 10.1 % (ref 0.0–14.0)
NEUT#: 1.7 10*3/uL (ref 1.5–6.5)
NEUT%: 59.2 % (ref 38.4–76.8)
Platelets: 179 10*3/uL (ref 145–400)
RBC: 3.3 10*6/uL — ABNORMAL LOW (ref 3.70–5.45)
RDW: 15.4 % — ABNORMAL HIGH (ref 11.2–14.5)
WBC: 2.9 10*3/uL — ABNORMAL LOW (ref 3.9–10.3)
lymph#: 0.8 10*3/uL — ABNORMAL LOW (ref 0.9–3.3)

## 2014-01-26 LAB — COMPREHENSIVE METABOLIC PANEL (CC13)
ALT: 21 U/L (ref 0–55)
AST: 21 U/L (ref 5–34)
Albumin: 3.6 g/dL (ref 3.5–5.0)
Alkaline Phosphatase: 67 U/L (ref 40–150)
Anion Gap: 5 mEq/L (ref 3–11)
BUN: 14.3 mg/dL (ref 7.0–26.0)
CO2: 27 mEq/L (ref 22–29)
Calcium: 9.5 mg/dL (ref 8.4–10.4)
Chloride: 105 mEq/L (ref 98–109)
Creatinine: 0.7 mg/dL (ref 0.6–1.1)
Glucose: 100 mg/dl (ref 70–140)
Potassium: 4 mEq/L (ref 3.5–5.1)
Sodium: 138 mEq/L (ref 136–145)
Total Bilirubin: 0.32 mg/dL (ref 0.20–1.20)
Total Protein: 6.9 g/dL (ref 6.4–8.3)

## 2014-01-26 MED ORDER — LORAZEPAM 1 MG PO TABS: ORAL_TABLET | ORAL | Status: DC

## 2014-01-26 MED ORDER — DEXAMETHASONE 4 MG PO TABS: ORAL_TABLET | ORAL | Status: DC

## 2014-01-26 MED ORDER — ONDANSETRON HCL 8 MG PO TABS: ORAL_TABLET | ORAL | Status: DC

## 2014-01-26 NOTE — Progress Notes (Signed)
OFFICE PROGRESS NOTE   01/26/2014   Physicians:Soper, John,Tower, [place]; Constant, Peggy; Kinard, Jeneen Rinks; Laurann Montana, [facility]); Mellody Drown (gyn onc [facility]);Kaplan, Carman Ching, Josph Macho   INTERVAL HISTORY:  Patient is seen, alone for visit, in continuing attention to adjuvant chemotherapy in process for T1aN0 high grade serous endometrial carcinoma, having had cycle 4 carbo taxol on 01-09-14, with neupogen x 3 days beginning 01-14-14 (timing due to taxol aches). She needed IVF on 6-24, which helped general weakness that day. HDR completed 01-06-14. We anticipate last chemotherapy ~ 02-20-14; she is to see Dr Sondra Come on 7-30 and Dr Josephina Shih on 03-13-14. She is feeling better overall now, and is looking forward to family dinner, including 5 grandchildren under age 65,  to celebrate her upcoming 68th birthday.  She does not have PAC. Peripheral IV access has not been difficult other than when she was not well hydrated on 01-14-14.   ONCOLOGIC HISTORY Patient had been in usual good health until she noticed vaginal spotting in Feb 2015. She was seen by PCP Dr Glori Bickers, with endometrial biopsy 09-17-13 by Dr Elly Modena [phone]) endometrial adenocarcinoma, which appeared to be grade II. She had pelvic and transvaginal US 09-23-13. She was seen in consultation by Dr Josephina Shih, then referred to Dr Clarene Essex at [facility] for earliest surgical availability. She had preoperative CXR at [facility]. Surgery 10-03-13 was robotic assisted total laparoscopic hysterectomy/ BSO/ bilateral pelvic and para-aortic nodes with sentinel node evaluation, tolerated well and patient discharged home on POD #1. [facility] surgical pathology [id number]) showed serous carcinoma FIGO grade 3 without myometrial invasion, no adnexal, serosal or cervical involvement, no LVSI, 0/27 nodes involved (9 para-aortic, 18 pelvic), pT1a N0. She saw Dr Clarene Essex in follow up on 10-05-13, with recommendation for 6 cycles of adjuvant carboplatin and taxol chemotherapy and  brachytherapy; she also had consultation with radiation oncology at [facility]. Chemotherapy education was done at [facility] 10-16-13. She had second opinion consultation by Dr Fransisca Connors at [facility]. First taxol carboplatin was given 11-06-13. She had high dose rate brachytherapy x5 from 12-09-13 thru 01-06-14.     Review of systems as above, also: Sleeping well since most recent chemo using 0.5 mg ativan at hs. Occasional mild nausea. Bowels ok. No fever or symptoms of infection. No bleeding. No SOB. No LE swelling Remainder of 10 point Review of Systems negative.  Objective:  Vital signs in last 24 hours:  BP 117/77  Pulse 77  Temp(Src) 98.4 F (36.9 C) (Oral)  Resp 18  Ht 5\' 3"  (1.6 m)  Wt 104 lb 3.2 oz (47.265 kg)  BMI 18.46 kg/m2  LMP 09/17/2000 weight is stable  Alert, oriented and appropriate. Ambulatory without difficulty, looks comfortable, talkative and very pleasant as usual. Alopecia  HEENT:PERRL, sclerae not icteric. Oral mucosa moist without lesions, posterior pharynx clear.  Neck supple. No JVD.  Lymphatics:no cervical,suraclavicular or inguinal adenopathy Resp: clear to auscultation bilaterally and normal percussion bilaterally Cardio: regular rate and rhythm. No gallop. GI: soft, nontender, not distended, no mass or organomegaly. Normally active bowel sounds. Surgical incision not remarkable. Musculoskeletal/ Extremities: without pitting edema, cords, tenderness Neuro: no peripheral neuropathy. Otherwise nonfocal. PSYCH normal mood and affect Skin without rash, ecchymosis, petechiae Portacath-without erythema or tenderness  Lab Results:  Results for orders placed in visit on 01/26/14  CBC WITH DIFFERENTIAL      Result Value Ref Range   WBC 2.9 (*) 3.9 - 10.3 10e3/uL   NEUT# 1.7  1.5 - 6.5 10e3/uL   HGB 10.7 (*)  11.6 - 15.9 g/dL   HCT 32.1 (*) 34.8 - 46.6 %   Platelets 179  145 - 400 10e3/uL   MCV 97.3  79.5 - 101.0 fL   MCH 32.4  25.1 - 34.0 pg   MCHC  33.3  31.5 - 36.0 g/dL   RBC 3.30 (*) 3.70 - 5.45 10e6/uL   RDW 15.4 (*) 11.2 - 14.5 %   lymph# 0.8 (*) 0.9 - 3.3 10e3/uL   MONO# 0.3  0.1 - 0.9 10e3/uL   Eosinophils Absolute 0.1  0.0 - 0.5 10e3/uL   Basophils Absolute 0.0  0.0 - 0.1 10e3/uL   NEUT% 59.2  38.4 - 76.8 %   LYMPH% 28.3  14.0 - 49.7 %   MONO% 10.1  0.0 - 14.0 %   EOS% 1.7  0.0 - 7.0 %   BASO% 0.7  0.0 - 2.0 %   CBC as above reviewed with patient. WBC a little lower as is often the case towards end of treatment.  CMET available after visit entirely normal, including creat 0.7  Studies/Results:  No results found.  Medications: I have reviewed the patient's current medications. Reasonable to continue low dose ativan at hs until chemo completes, then better to use this just occasionally   Patient instructed to call if she feels very weak after upcoming chemotherapy, as she will need IVF if so. She understands need to push po fluids as possible after chemo. BP good today.  Assessment/Plan:  IA high grade serous endometrial carcinoma: post TAH/BSO/ pelvic and para-aortic node evaluation at [facility] 10-03-13 and first taxol carbo on 11-06-13. Due cycle 5 on 01-30-14 as long as [place] >=1.5 and plt >=100k day of treatment, gCSF x3 beginning 02-04-14. I will see her 7-22 prior to cycle 6. HDR completed 01-06-14. Apts Dr Sondra Come 7-30 and Dr Josephina Shih 03-13-14. 2.family history of breast and ovarian cancer, Ashkenazi Jewish descent. Genetics testing sent 12-10-13 returned normal for 24 gene OvaNext panel. 3.single episode of self-limited LLQ pain and vomiting. History suggests brief bowel kink or obstruction. Asymptomatic now.  4. psoriasis: scalp improved  5.possible allergic reaction to sulfa, which we will avoid. Tolerated dose reduced benadryl cycle 1; history does not support true allergy to that medication  6.benign left breast and axillary node biopsies, benign congenital skin area excised from neck in childhood, remote right knee  surgery, tonsillectomy, BTL, CKC.  7.internal hemorrhoids at recent colonoscopy. Try proctofoam HC for ~ a week around chemo.  8.osteoporosis: ok to hold Fosamax if nausea from chemo   Chemo and gCSF orders confirmed with labs today. Time spent 25 min including >5-% counseling and coordination of care.    Audrinna Sherman P, MD   01/26/2014, 11:30 AM

## 2014-01-28 ENCOUNTER — Telehealth: Payer: Self-pay | Admitting: Oncology

## 2014-01-28 ENCOUNTER — Telehealth: Payer: Self-pay | Admitting: *Deleted

## 2014-01-28 NOTE — Telephone Encounter (Signed)
per pof to sch pt trmt-sent MW an email-will call pt after reply from [facility]

## 2014-01-28 NOTE — Telephone Encounter (Signed)
Per staff message and POF I have scheduled appts. Advised scheduler of appts. JMW  

## 2014-01-30 ENCOUNTER — Ambulatory Visit (HOSPITAL_BASED_OUTPATIENT_CLINIC_OR_DEPARTMENT_OTHER): Payer: Medicare Other

## 2014-01-30 ENCOUNTER — Encounter (HOSPITAL_COMMUNITY): Payer: Self-pay | Admitting: Emergency Medicine

## 2014-01-30 ENCOUNTER — Other Ambulatory Visit (HOSPITAL_BASED_OUTPATIENT_CLINIC_OR_DEPARTMENT_OTHER): Payer: Medicare Other

## 2014-01-30 ENCOUNTER — Emergency Department (HOSPITAL_COMMUNITY)
Admission: EM | Admit: 2014-01-30 | Discharge: 2014-01-30 | Disposition: A | Discharge status: Home / Self Care | Payer: Medicare Other | Attending: Emergency Medicine | Admitting: Emergency Medicine

## 2014-01-30 VITALS — BP 102/50 | HR 83 | Temp 97.3°F | Resp 16

## 2014-01-30 DIAGNOSIS — Z8742 Personal history of other diseases of the female genital tract: Secondary | ICD-10-CM | POA: Insufficient documentation

## 2014-01-30 DIAGNOSIS — Z872 Personal history of diseases of the skin and subcutaneous tissue: Secondary | ICD-10-CM | POA: Insufficient documentation

## 2014-01-30 DIAGNOSIS — Z79899 Other long term (current) drug therapy: Secondary | ICD-10-CM | POA: Insufficient documentation

## 2014-01-30 DIAGNOSIS — Z87891 Personal history of nicotine dependence: Secondary | ICD-10-CM | POA: Diagnosis not present

## 2014-01-30 DIAGNOSIS — Y9389 Activity, other specified: Secondary | ICD-10-CM | POA: Insufficient documentation

## 2014-01-30 DIAGNOSIS — Z8669 Personal history of other diseases of the nervous system and sense organs: Secondary | ICD-10-CM | POA: Insufficient documentation

## 2014-01-30 DIAGNOSIS — F411 Generalized anxiety disorder: Secondary | ICD-10-CM | POA: Diagnosis not present

## 2014-01-30 DIAGNOSIS — D649 Anemia, unspecified: Secondary | ICD-10-CM | POA: Diagnosis not present

## 2014-01-30 DIAGNOSIS — Y9289 Other specified places as the place of occurrence of the external cause: Secondary | ICD-10-CM | POA: Diagnosis not present

## 2014-01-30 DIAGNOSIS — T424X4A Poisoning by benzodiazepines, undetermined, initial encounter: Secondary | ICD-10-CM | POA: Insufficient documentation

## 2014-01-30 DIAGNOSIS — IMO0002 Reserved for concepts with insufficient information to code with codable children: Secondary | ICD-10-CM | POA: Diagnosis not present

## 2014-01-30 DIAGNOSIS — Z85828 Personal history of other malignant neoplasm of skin: Secondary | ICD-10-CM | POA: Insufficient documentation

## 2014-01-30 DIAGNOSIS — Z8619 Personal history of other infectious and parasitic diseases: Secondary | ICD-10-CM | POA: Diagnosis not present

## 2014-01-30 DIAGNOSIS — T424X1A Poisoning by benzodiazepines, accidental (unintentional), initial encounter: Secondary | ICD-10-CM | POA: Insufficient documentation

## 2014-01-30 DIAGNOSIS — Z8739 Personal history of other diseases of the musculoskeletal system and connective tissue: Secondary | ICD-10-CM | POA: Insufficient documentation

## 2014-01-30 DIAGNOSIS — Z8542 Personal history of malignant neoplasm of other parts of uterus: Secondary | ICD-10-CM | POA: Insufficient documentation

## 2014-01-30 DIAGNOSIS — C541 Malignant neoplasm of endometrium: Secondary | ICD-10-CM

## 2014-01-30 DIAGNOSIS — C549 Malignant neoplasm of corpus uteri, unspecified: Secondary | ICD-10-CM

## 2014-01-30 LAB — CBC WITH DIFFERENTIAL/PLATELET
BASO%: 0.4 % (ref 0.0–2.0)
Basophils Absolute: 0 10*3/uL (ref 0.0–0.1)
EOS%: 0 % (ref 0.0–7.0)
Eosinophils Absolute: 0 10*3/uL (ref 0.0–0.5)
HCT: 33.2 % — ABNORMAL LOW (ref 34.8–46.6)
HGB: 11 g/dL — ABNORMAL LOW (ref 11.6–15.9)
LYMPH%: 21 % (ref 14.0–49.7)
MCH: 32.2 pg (ref 25.1–34.0)
MCHC: 33.1 g/dL (ref 31.5–36.0)
MCV: 97.1 fL (ref 79.5–101.0)
MONO#: 0 10*3/uL — ABNORMAL LOW (ref 0.1–0.9)
MONO%: 1.7 % (ref 0.0–14.0)
NEUT#: 1.8 10*3/uL (ref 1.5–6.5)
NEUT%: 76.9 % — ABNORMAL HIGH (ref 38.4–76.8)
Platelets: 243 10*3/uL (ref 145–400)
RBC: 3.42 10*6/uL — ABNORMAL LOW (ref 3.70–5.45)
RDW: 15.5 % — ABNORMAL HIGH (ref 11.2–14.5)
WBC: 2.4 10*3/uL — ABNORMAL LOW (ref 3.9–10.3)
lymph#: 0.5 10*3/uL — ABNORMAL LOW (ref 0.9–3.3)
nRBC: 0 % (ref 0–0)

## 2014-01-30 MED ORDER — SODIUM CHLORIDE 0.9 % IV SOLN
Freq: Once | INTRAVENOUS | Status: AC
  Administered 2014-01-30: 12:00:00 via INTRAVENOUS

## 2014-01-30 NOTE — ED Notes (Signed)
Pt and family report pt currently has endometrial cancer, is undergoing chemo therapy. Today is suppose to be her 4th chemo paxil infusion. Pt mistakenly took 4 tables 1mg  lorazepam thinking it was her steroid medication. Pt then took her steroid medication as well. Pt is acting tired. Slow to answer questions. Husband reports pt is acting extremely tired.   Medications 1mg  lorazepam  tablets 4mg  dexamethazone tablets

## 2014-01-30 NOTE — ED Provider Notes (Signed)
CSN: [id number]     Arrival date & time 01/30/14  0740 History   First MD Initiated Contact with Patient 01/30/14 [phone]     Chief Complaint  Patient presents with  . CA patient, medication error    Level V caveat: Altered metal status  HPI Patient is brought to the emergency department by her husband after she reportedly took 4-5 mg of Ativan this morning accidentally.  She thought it was her dexamethasone which she is supposed to take prior to chemotherapy.  He states that she's been sleepy.  He called the cancer Center and they requested that the patient come the emergency department for evaluation.  She's otherwise been in her normal state of health over the past several days.  This was unintentional.   Past Medical History  Diagnosis Date  . Hx of basal cell carcinoma     face  . Diffuse cystic mastopathy   . Headache(784.0)   . Unspecified hemorrhoids without mention of complication   . Other malaise and fatigue   . Disorder of bone and cartilage, unspecified   . Other psoriasis   . Other seborrheic keratosis   . Sleep disturbance, unspecified   . Predominant disturbance of emotions   . Pain in limb   . Urticaria, unspecified   . History of shingles   . Anxiety   . Anemia   . Dysautonomia     mild  . Osteoporosis   . Endometrial cancer   . Ashkenazi Jewish ancestry    Past Surgical History  Procedure Laterality Date  . Cervical conization w/bx    . Cervical cancer  1973  . Breast biopsy Left 2001    benign  . Tubal ligation    . Dexa  03/2002    oseopenia  . Colonoscopy  12/04    int. hemorrhoids  . Dexa  07/2004    decreased BMD, osteopenia  . Dexa  02/2007    Osteopenia  . Breast lumpectomy      left underarm  . Birthmark removal  childhood  . Dexa  9/10    Osteopenia slightly worse  . Felon i&d  12/2010    Dr. Fredna Dow, I&D in OR (L index finger)  . Finger surgery    . Knee surgery      right  . Robotic assisted total hysterectomy with bilateral salpingo  oopherectomy  10/03/2013    Robotic-assisted total laparoscopic hysterectomy, bilateral salpingo-oophorectomy, bilateral pelvic and para-aortic lymphadenectomy with sentinel lymph node dissection  . Tonsilectomy, adenoidectomy, bilateral myringotomy and tubes    . Axillary lymph node dissection      left   Family History  Problem Relation Age of Onset  . Multiple myeloma Father   . Coronary artery disease Father   . Transient ischemic attack Mother   . Lupus Maternal Aunt   . Breast cancer Maternal Aunt     dx 35s; deceased  . Cancer Maternal Grandfather     GI cancer or pancreatic; deceased 53  . Colon cancer Neg Hx   . Ovarian cancer Maternal Aunt     dx 22s; deceased 47s  . Multiple sclerosis Brother    History  Substance Use Topics  . Smoking status: Former Smoker    Quit date: 07/25/1963  . Smokeless tobacco: Never Used  . Alcohol Use: 0.0 oz/week    0 drink(s) per week     Comment: 1/2 glass of wine a day   OB History   Grav Para Term  Preterm Abortions TAB SAB Ect Mult Living                 Review of Systems  Unable to perform ROS: Mental status change      Allergies  Bactrim; Oxycodone-acetaminophen; and Sulfa antibiotics  Home Medications   Prior to Admission medications   Medication Sig Start Date End Date Taking? Authorizing Provider  Ascorbic Acid (VITAMIN C) 100 MG tablet Take 100 mg by mouth daily.   Yes Historical Provider, MD  dexamethasone (DECADRON) 4 MG tablet Take 5 tablets (=25m) with food 12 hours and 6 hours prior to taxol 01/26/14  Yes Lennis P Livesay, MD  LORazepam (ATIVAN) 1 MG tablet 1/2 to 1 tablet  Under tongue or by mouth every 6 hours as needed for nausea. 01/26/14  Yes Lennis PMarion Downer MD  Multiple Vitamin (MULTIVITAMIN) tablet Take 1 tablet by mouth daily.   Yes Historical Provider, MD  Biotin 1 MG CAPS Take 1 tablet by mouth daily.    Historical Provider, MD  cholecalciferol (VITAMIN D) 400 UNITS TABS Take by mouth.    Historical  Provider, MD  co-enzyme Q-10 30 MG capsule Take 30 mg by mouth 3 (three) times daily.    Historical Provider, MD  fish oil-omega-3 fatty acids 1000 MG capsule Take by mouth 2 (two) times daily.     Historical Provider, MD  FOLBIC 2.5-25-2 MG TABS TAKE 1 TABLET BY MOUTH DAILY 10/27/13   MAbner Greenspan MD  HYDROcodone-acetaminophen (VICODIN) 5-500 MG per tablet Take 1 tablet by mouth every 6 (six) hours as needed for pain.    Historical Provider, MD  hydrocortisone-pramoxine ([employer] rectal foam Place 1 applicator rectally 2 (two) times daily as needed for hemorrhoids. 01/05/14   Lennis PMarion Downer MD  ibuprofen (ADVIL,MOTRIN) 200 MG tablet Take 200 mg by mouth as needed for pain.    Historical Provider, MD  loratadine (CLARITIN) 10 MG tablet Take 10 mg by mouth daily as needed for allergies.    Historical Provider, MD  magnesium 30 MG tablet Take 30 mg by mouth daily.    Historical Provider, MD  ondansetron (ZOFRAN) 8 MG tablet 1-2 tablets every 12 hours as needed for nausea. Will not make drowsy. Also take 1 tablet AM after chemo whether or not any nausea 01/26/14   Lennis PMarion Downer MD  polyethylene glycol (MIRALAX / GLYCOLAX) packet Take 17 g by mouth daily as needed. 11/07/13   Historical Provider, MD  senna-docusate (SENOKOT S) 8.6-50 MG per tablet Take 2 tablets by mouth at bedtime.  11/07/13   Historical Provider, MD  triamcinolone (KENALOG) 0.1 % paste Use as directed 1 application in the mouth or throat 2 (two) times daily. Apply to tongue at bedtime or twice a day 12/10/13   Lennis P Livesay, MD   BP 103/56  Pulse 70  Temp(Src) 98.1 F (36.7 C) (Oral)  Resp 14  SpO2 99%  LMP 09/17/2000 Physical Exam  Nursing note and vitals reviewed. Constitutional: She appears well-developed and well-nourished. No distress.  HENT:  Head: Normocephalic and atraumatic.  Eyes: EOM are normal.  Neck: Normal range of motion.  Cardiovascular: Normal rate, regular rhythm and normal heart sounds.    Pulmonary/Chest: Effort normal and breath sounds normal.  Abdominal: Soft. She exhibits no distension. There is no tenderness.  Musculoskeletal: Normal range of motion.  Neurological:  Opens eyes to voice.  Follow simple commands.  Skin: Skin is warm and dry.  Psychiatric: She has a normal  mood and affect. Judgment normal.    ED Course  Procedures (including critical care time) Labs Review Labs Reviewed - No data to display  Imaging Review No results found.   EKG Interpretation None      MDM   Final diagnoses:  None    Patient has been observed in the emergency department.  She's protecting her airway.  I don't imagine she'll get worse.  She'll be taken over the cancer Center for her chemotherapy at this time where they can continue to monitor.  Nonlethal overdose, unintentional    Hoy Morn, MD 01/30/14 [phone]

## 2014-01-30 NOTE — Progress Notes (Signed)
Patient arrived for chemotherapy today. Dr. Marko Plume notified of patient's recent visit to the ED for accidentally taking her ativan instead of decadron tablets this morning. Received orders to hold chemotherapy today and give patient fluids and monitor VS every 30 minutes. Patient discharged home with husband. Patient alert and oriented upon discharge. Chemo rescheduled for next week per Dr. Marko Plume. Patient given copy of updated schedule and agreeable to return next Tuesday for treatment. Cindi Carbon, RN

## 2014-01-30 NOTE — ED Notes (Signed)
Pt escorted to discharge window. Pt verbalized understanding discharge instructions. In no acute distress.  

## 2014-02-02 ENCOUNTER — Telehealth: Payer: Self-pay

## 2014-02-02 NOTE — Telephone Encounter (Signed)
Returned pt call - confirmed times to take dexamethasone 9 pm tonight and 3 am.  Pt voiced understanding.    Pt c/o feeling "crappy" since Sunday after breakfast.   Little appetite, "feeling of fullness", bowel movements but not diarrhea - "emptied myself out".  Pt reports having been around many groups/family recently.  Woke this morning feeling a little better.   Let pt know I would let Dr. Marko Plume know.    Routed to LL.

## 2014-02-03 ENCOUNTER — Other Ambulatory Visit: Payer: Self-pay | Admitting: Oncology

## 2014-02-03 ENCOUNTER — Ambulatory Visit (HOSPITAL_BASED_OUTPATIENT_CLINIC_OR_DEPARTMENT_OTHER): Payer: Medicare Other

## 2014-02-03 ENCOUNTER — Telehealth: Payer: Self-pay | Admitting: Oncology

## 2014-02-03 VITALS — BP 110/56 | HR 92 | Temp 97.0°F

## 2014-02-03 DIAGNOSIS — C549 Malignant neoplasm of corpus uteri, unspecified: Secondary | ICD-10-CM

## 2014-02-03 DIAGNOSIS — C4491 Basal cell carcinoma of skin, unspecified: Secondary | ICD-10-CM

## 2014-02-03 DIAGNOSIS — C541 Malignant neoplasm of endometrium: Secondary | ICD-10-CM

## 2014-02-03 DIAGNOSIS — Z5111 Encounter for antineoplastic chemotherapy: Secondary | ICD-10-CM

## 2014-02-03 LAB — CBC WITH DIFFERENTIAL/PLATELET
BASO%: 0 % (ref 0.0–2.0)
Basophils Absolute: 0 10*3/uL (ref 0.0–0.1)
EOS%: 0 % (ref 0.0–7.0)
Eosinophils Absolute: 0 10*3/uL (ref 0.0–0.5)
HCT: 32.9 % — ABNORMAL LOW (ref 34.8–46.6)
HGB: 11.2 g/dL — ABNORMAL LOW (ref 11.6–15.9)
LYMPH%: 8.3 % — ABNORMAL LOW (ref 14.0–49.7)
MCH: 32.7 pg (ref 25.1–34.0)
MCHC: 34 g/dL (ref 31.5–36.0)
MCV: 95.9 fL (ref 79.5–101.0)
MONO#: 0 10*3/uL — ABNORMAL LOW (ref 0.1–0.9)
MONO%: 0.7 % (ref 0.0–14.0)
NEUT#: 3.7 10*3/uL (ref 1.5–6.5)
NEUT%: 91 % — ABNORMAL HIGH (ref 38.4–76.8)
Platelets: 237 10*3/uL (ref 145–400)
RBC: 3.43 10*6/uL — ABNORMAL LOW (ref 3.70–5.45)
RDW: 15.1 % — ABNORMAL HIGH (ref 11.2–14.5)
WBC: 4.1 10*3/uL (ref 3.9–10.3)
lymph#: 0.3 10*3/uL — ABNORMAL LOW (ref 0.9–3.3)

## 2014-02-03 MED ORDER — ONDANSETRON 16 MG/50ML IVPB (CHCC)
INTRAVENOUS | Status: AC
  Filled 2014-02-03: qty 16

## 2014-02-03 MED ORDER — DIPHENHYDRAMINE HCL 50 MG/ML IJ SOLN
INTRAMUSCULAR | Status: AC
  Filled 2014-02-03: qty 1

## 2014-02-03 MED ORDER — SODIUM CHLORIDE 0.9 % IV SOLN
322.0000 mg | Freq: Once | INTRAVENOUS | Status: AC
  Administered 2014-02-03: 320 mg via INTRAVENOUS
  Filled 2014-02-03: qty 32

## 2014-02-03 MED ORDER — FAMOTIDINE IN NACL 20-0.9 MG/50ML-% IV SOLN
20.0000 mg | Freq: Once | INTRAVENOUS | Status: AC
  Administered 2014-02-03: 20 mg via INTRAVENOUS

## 2014-02-03 MED ORDER — SODIUM CHLORIDE 0.9 % IV SOLN
Freq: Once | INTRAVENOUS | Status: AC
  Administered 2014-02-03: 11:00:00 via INTRAVENOUS

## 2014-02-03 MED ORDER — FAMOTIDINE IN NACL 20-0.9 MG/50ML-% IV SOLN
INTRAVENOUS | Status: AC
  Filled 2014-02-03: qty 50

## 2014-02-03 MED ORDER — LORAZEPAM 1 MG PO TABS
0.5000 mg | ORAL_TABLET | Freq: Once | ORAL | Status: AC | PRN
  Administered 2014-02-03: 1 mg via ORAL

## 2014-02-03 MED ORDER — ONDANSETRON 16 MG/50ML IVPB (CHCC)
16.0000 mg | Freq: Once | INTRAVENOUS | Status: AC
  Administered 2014-02-03: 16 mg via INTRAVENOUS

## 2014-02-03 MED ORDER — DIPHENHYDRAMINE HCL 50 MG/ML IJ SOLN: 12.5000 mg | Freq: Once | INTRAMUSCULAR | Status: DC

## 2014-02-03 MED ORDER — DEXAMETHASONE SODIUM PHOSPHATE 20 MG/5ML IJ SOLN
INTRAMUSCULAR | Status: AC
  Filled 2014-02-03: qty 5

## 2014-02-03 MED ORDER — DEXAMETHASONE SODIUM PHOSPHATE 20 MG/5ML IJ SOLN
20.0000 mg | Freq: Once | INTRAMUSCULAR | Status: AC
  Administered 2014-02-03: 20 mg via INTRAVENOUS

## 2014-02-03 MED ORDER — LORAZEPAM 1 MG PO TABS
ORAL_TABLET | ORAL | Status: AC
  Filled 2014-02-03: qty 1

## 2014-02-03 MED ORDER — DIPHENHYDRAMINE HCL 12.5 MG/5ML PO ELIX
12.5000 mg | ORAL_SOLUTION | Freq: Once | ORAL | Status: AC
  Administered 2014-02-03: 12.5 mg via ORAL
  Filled 2014-02-03: qty 5

## 2014-02-03 MED ORDER — PACLITAXEL CHEMO INJECTION 300 MG/50ML
175.0000 mg/m2 | Freq: Once | INTRAVENOUS | Status: AC
  Administered 2014-02-03: 252 mg via INTRAVENOUS
  Filled 2014-02-03: qty 42

## 2014-02-03 NOTE — Telephone Encounter (Signed)
per pof LL cnacel trmt, inj and r/s per dates on pof-sent email to MW to sch trmt for 8/4-adv pt I would mail updated sch once reply recieved-cld & spoke to pt and gave  CX trmts and dates-adv new copy will be mailed

## 2014-02-03 NOTE — Patient Instructions (Signed)
[facility] Discharge Instructions for Patients Receiving Chemotherapy  Today you received the following chemotherapy agents taxol/carboplatin  To help prevent nausea and vomiting after your treatment, we encourage you to take your nausea medication as directed   If you develop nausea and vomiting that is not controlled by your nausea medication, call the clinic.   BELOW ARE SYMPTOMS THAT SHOULD BE REPORTED IMMEDIATELY:  *FEVER GREATER THAN 100.5 F  *CHILLS WITH OR WITHOUT FEVER  NAUSEA AND VOMITING THAT IS NOT CONTROLLED WITH YOUR NAUSEA MEDICATION  *UNUSUAL SHORTNESS OF BREATH  *UNUSUAL BRUISING OR BLEEDING  TENDERNESS IN MOUTH AND THROAT WITH OR WITHOUT PRESENCE OF ULCERS  *URINARY PROBLEMS  *BOWEL PROBLEMS  UNUSUAL RASH Items with * indicate a potential emergency and should be followed up as soon as possible.  Feel free to call the clinic you have any questions or concerns. The clinic phone number is [phone].  

## 2014-02-03 NOTE — Telephone Encounter (Signed)
pt stopped by today re getting appts adjusted. per pt due to being treated today instead of last friday 01/30/14 her inj appts and all futre appts will need to be changed. pt does not plan to keep inj appt tomorrow. message to LL to confirm and send new pof with changes - pt aware.

## 2014-02-04 ENCOUNTER — Ambulatory Visit: Payer: Medicare Other

## 2014-02-05 ENCOUNTER — Ambulatory Visit: Payer: Medicare Other

## 2014-02-06 ENCOUNTER — Ambulatory Visit: Payer: Medicare Other

## 2014-02-09 ENCOUNTER — Ambulatory Visit (HOSPITAL_BASED_OUTPATIENT_CLINIC_OR_DEPARTMENT_OTHER): Payer: Medicare Other

## 2014-02-09 VITALS — BP 90/74 | HR 83 | Temp 98.4°F

## 2014-02-09 DIAGNOSIS — C549 Malignant neoplasm of corpus uteri, unspecified: Secondary | ICD-10-CM | POA: Diagnosis not present

## 2014-02-09 DIAGNOSIS — Z5189 Encounter for other specified aftercare: Secondary | ICD-10-CM

## 2014-02-09 DIAGNOSIS — C541 Malignant neoplasm of endometrium: Secondary | ICD-10-CM

## 2014-02-09 MED ORDER — TBO-FILGRASTIM 300 MCG/0.5ML ~~LOC~~ SOSY
300.0000 ug | PREFILLED_SYRINGE | Freq: Once | SUBCUTANEOUS | Status: AC
  Administered 2014-02-09: 300 ug via SUBCUTANEOUS
  Filled 2014-02-09: qty 0.5

## 2014-02-10 ENCOUNTER — Other Ambulatory Visit: Payer: Self-pay | Admitting: Oncology

## 2014-02-10 ENCOUNTER — Ambulatory Visit (HOSPITAL_BASED_OUTPATIENT_CLINIC_OR_DEPARTMENT_OTHER): Payer: Medicare Other

## 2014-02-10 VITALS — BP 98/52 | HR 86 | Temp 98.9°F

## 2014-02-10 DIAGNOSIS — C549 Malignant neoplasm of corpus uteri, unspecified: Secondary | ICD-10-CM

## 2014-02-10 DIAGNOSIS — Z5189 Encounter for other specified aftercare: Secondary | ICD-10-CM

## 2014-02-10 DIAGNOSIS — C541 Malignant neoplasm of endometrium: Secondary | ICD-10-CM

## 2014-02-10 MED ORDER — TBO-FILGRASTIM 300 MCG/0.5ML ~~LOC~~ SOSY
300.0000 ug | PREFILLED_SYRINGE | Freq: Once | SUBCUTANEOUS | Status: AC
  Administered 2014-02-10: 300 ug via SUBCUTANEOUS
  Filled 2014-02-10: qty 0.5

## 2014-02-11 ENCOUNTER — Other Ambulatory Visit (HOSPITAL_BASED_OUTPATIENT_CLINIC_OR_DEPARTMENT_OTHER): Payer: Medicare Other

## 2014-02-11 ENCOUNTER — Ambulatory Visit (HOSPITAL_BASED_OUTPATIENT_CLINIC_OR_DEPARTMENT_OTHER): Payer: Medicare Other | Admitting: Oncology

## 2014-02-11 ENCOUNTER — Encounter: Payer: Self-pay | Admitting: Oncology

## 2014-02-11 ENCOUNTER — Ambulatory Visit (HOSPITAL_BASED_OUTPATIENT_CLINIC_OR_DEPARTMENT_OTHER): Payer: Medicare Other

## 2014-02-11 VITALS — BP 103/59 | HR 86 | Temp 98.8°F | Resp 18 | Ht 63.0 in | Wt 103.4 lb

## 2014-02-11 DIAGNOSIS — C549 Malignant neoplasm of corpus uteri, unspecified: Secondary | ICD-10-CM

## 2014-02-11 DIAGNOSIS — Z5189 Encounter for other specified aftercare: Secondary | ICD-10-CM | POA: Diagnosis not present

## 2014-02-11 DIAGNOSIS — C541 Malignant neoplasm of endometrium: Secondary | ICD-10-CM

## 2014-02-11 DIAGNOSIS — Z8041 Family history of malignant neoplasm of ovary: Secondary | ICD-10-CM | POA: Diagnosis not present

## 2014-02-11 DIAGNOSIS — M810 Age-related osteoporosis without current pathological fracture: Secondary | ICD-10-CM | POA: Diagnosis not present

## 2014-02-11 LAB — CBC WITH DIFFERENTIAL/PLATELET
BASO%: 0.4 % (ref 0.0–2.0)
Basophils Absolute: 0 10*3/uL (ref 0.0–0.1)
EOS%: 2.6 % (ref 0.0–7.0)
Eosinophils Absolute: 0.2 10*3/uL (ref 0.0–0.5)
HCT: 29.6 % — ABNORMAL LOW (ref 34.8–46.6)
HGB: 9.7 g/dL — ABNORMAL LOW (ref 11.6–15.9)
LYMPH%: 9.1 % — ABNORMAL LOW (ref 14.0–49.7)
MCH: 32.8 pg (ref 25.1–34.0)
MCHC: 32.8 g/dL (ref 31.5–36.0)
MCV: 100.1 fL (ref 79.5–101.0)
MONO#: 0.4 10*3/uL (ref 0.1–0.9)
MONO%: 6 % (ref 0.0–14.0)
NEUT#: 4.9 10*3/uL (ref 1.5–6.5)
NEUT%: 81.9 % — ABNORMAL HIGH (ref 38.4–76.8)
Platelets: 113 10*3/uL — ABNORMAL LOW (ref 145–400)
RBC: 2.96 10*6/uL — ABNORMAL LOW (ref 3.70–5.45)
RDW: 15.1 % — ABNORMAL HIGH (ref 11.2–14.5)
WBC: 5.9 10*3/uL (ref 3.9–10.3)
lymph#: 0.5 10*3/uL — ABNORMAL LOW (ref 0.9–3.3)

## 2014-02-11 LAB — COMPREHENSIVE METABOLIC PANEL (CC13)
ALT: 26 U/L (ref 0–55)
AST: 22 U/L (ref 5–34)
Albumin: 3.5 g/dL (ref 3.5–5.0)
Alkaline Phosphatase: 67 U/L (ref 40–150)
Anion Gap: 6 mEq/L (ref 3–11)
BUN: 13.7 mg/dL (ref 7.0–26.0)
CO2: 30 mEq/L — ABNORMAL HIGH (ref 22–29)
Calcium: 9.4 mg/dL (ref 8.4–10.4)
Chloride: 103 mEq/L (ref 98–109)
Creatinine: 0.7 mg/dL (ref 0.6–1.1)
Glucose: 97 mg/dl (ref 70–140)
Potassium: 3.7 mEq/L (ref 3.5–5.1)
Sodium: 140 mEq/L (ref 136–145)
Total Bilirubin: 0.23 mg/dL (ref 0.20–1.20)
Total Protein: 6.5 g/dL (ref 6.4–8.3)

## 2014-02-11 MED ORDER — TBO-FILGRASTIM 300 MCG/0.5ML ~~LOC~~ SOSY
300.0000 ug | PREFILLED_SYRINGE | Freq: Once | SUBCUTANEOUS | Status: AC
  Administered 2014-02-11: 300 ug via SUBCUTANEOUS
  Filled 2014-02-11: qty 0.5

## 2014-02-11 MED ORDER — ONDANSETRON HCL 8 MG PO TABS: ORAL_TABLET | ORAL | Status: DC

## 2014-02-11 MED ORDER — HEMOCYTE 324 (106 FE) MG PO TABS: 1.0000 | ORAL_TABLET | Freq: Every day | ORAL | Status: DC

## 2014-02-11 NOTE — Progress Notes (Signed)
OFFICE PROGRESS NOTE   02/11/2014   Physicians:Soper, John,Tower, [place]; Constant, Peggy; Kinard, Jeneen Rinks; Laurann Montana, [facility]); Mellody Drown (gyn onc [facility]);Kaplan, Carman Ching, Josph Macho   INTERVAL HISTORY:   Patient is seen, alone for visit, as she continues adjuvant chemotherapy for T1aN0 high grade serous endometrial carcinoma. Cycle 5 was delayed for 4 days after patient took five ativan rather than five decadron tablets as premed for taxol, fortunately without related problems tho she did need observation in ED and at [facility] that day. Cycle 5 was given on 02-03-14 with neupogen x3 days thru today. Patient has had more nausea and felt more weak with this cycle, particularly days 3-4, and agrees with recommendation for IVF after cycle 6. She noticed "heart racing" when outdoors in heat last 2 days. She is feeling some better today otherwise, with po intake improved. She has follow up visit with Dr Sondra Come 02-19-14 and will see Dr Josephina Shih on 03-13-14, likely to continue gyn oncology follow up at [facility] clinic from there rather than back to Dr Clarene Essex at [facility].   She does not have PAC.  ONCOLOGIC HISTORY Patient had been in usual good health until she noticed vaginal spotting in Feb 2015. She was seen by PCP Dr Glori Bickers, with endometrial biopsy 09-17-13 by Dr Elly Modena [phone]) endometrial adenocarcinoma, which appeared to be grade II. She had pelvic and transvaginal US 09-23-13. She was seen in consultation by Dr Josephina Shih, then referred to Dr Clarene Essex at [facility] for earliest surgical availability. She had preoperative CXR at [facility]. Surgery 10-03-13 was robotic assisted total laparoscopic hysterectomy/ BSO/ bilateral pelvic and para-aortic nodes with sentinel node evaluation, tolerated well and patient discharged home on POD #1. [facility] surgical pathology [id number]) showed serous carcinoma FIGO grade 3 without myometrial invasion, no adnexal, serosal or cervical involvement, no LVSI, 0/27 nodes  involved (9 para-aortic, 18 pelvic), pT1a N0. She saw Dr Clarene Essex in follow up on 10-05-13, with recommendation for 6 cycles of adjuvant carboplatin and taxol chemotherapy and brachytherapy; she also had consultation with radiation oncology at [facility]. Chemotherapy education was done at [facility] 10-16-13. She had second opinion consultation by Dr Fransisca Connors at [facility]. First taxol carboplatin was given 11-06-13. She had high dose rate brachytherapy x5 from 12-09-13 thru 01-06-14.  Review of systems as above, also: No peripheral neuropathy. No fever or symptoms of infection. No SOB at rest. No bleeding. Bowels ok. No chest pain.   Discussed checking pulse rate if she notices the racing again. Remainder of 10 point Review of Systems negative.  Objective:  Vital signs in last 24 hours:  BP 103/59  Pulse 86  Temp(Src) 98.8 F (37.1 C) (Oral)  Resp 18  Ht 5\' 3"  (1.6 m)  Wt 103 lb 6.4 oz (46.902 kg)  BMI 18.32 kg/m2  LMP 09/17/2000 Weight is down 1 lb. Alert, oriented and appropriate. Ambulatory without difficulty.  Alopecia  HEENT:PERRL, sclerae not icteric. Oral mucosa moist without lesions, posterior pharynx clear.  Neck supple. No JVD.  Lymphatics:no cervical,suraclavicular or inguinal adenopathy Resp: clear to auscultation bilaterally and normal percussion bilaterally Cardio: regular rate and rhythm. No gallop. GI: soft, nontender, not distended, no mass or organomegaly. Normally active bowel sounds. Surgical incision not remarkable. Musculoskeletal/ Extremities: without pitting edema, cords, tenderness Neuro: no peripheral neuropathy. Otherwise nonfocal. PSYCH normal mood and affect Skin probable psoriasis mid back otherwise without rash, ecchymosis, petechiae   Lab Results:  Results for orders placed in visit on 02/11/14  CBC WITH DIFFERENTIAL  Result Value Ref Range   WBC 5.9  3.9 - 10.3 10e3/uL   NEUT# 4.9  1.5 - 6.5 10e3/uL   HGB 9.7 (*) 11.6 - 15.9 g/dL   HCT 29.6  (*) 34.8 - 46.6 %   Platelets 113 (*) 145 - 400 10e3/uL   MCV 100.1  79.5 - 101.0 fL   MCH 32.8  25.1 - 34.0 pg   MCHC 32.8  31.5 - 36.0 g/dL   RBC 2.96 (*) 3.70 - 5.45 10e6/uL   RDW 15.1 (*) 11.2 - 14.5 %   lymph# 0.5 (*) 0.9 - 3.3 10e3/uL   MONO# 0.4  0.1 - 0.9 10e3/uL   Eosinophils Absolute 0.2  0.0 - 0.5 10e3/uL   Basophils Absolute 0.0  0.0 - 0.1 10e3/uL   NEUT% 81.9 (*) 38.4 - 76.8 %   LYMPH% 9.1 (*) 14.0 - 49.7 %   MONO% 6.0  0.0 - 14.0 %   EOS% 2.6  0.0 - 7.0 %   BASO% 0.4  0.0 - 2.0 %  COMPREHENSIVE METABOLIC PANEL (ZO10)      Result Value Ref Range   Sodium 140  136 - 145 mEq/L   Potassium 3.7  3.5 - 5.1 mEq/L   Chloride 103  98 - 109 mEq/L   CO2 30 (*) 22 - 29 mEq/L   Glucose 97  70 - 140 mg/dl   BUN 13.7  7.0 - 26.0 mg/dL   Creatinine 0.7  0.6 - 1.1 mg/dL   Total Bilirubin 0.23  0.20 - 1.20 mg/dL   Alkaline Phosphatase 67  40 - 150 U/L   AST 22  5 - 34 U/L   ALT 26  0 - 55 U/L   Total Protein 6.5  6.4 - 8.3 g/dL   Albumin 3.5  3.5 - 5.0 g/dL   Calcium 9.4  8.4 - 10.4 mg/dL   Anion Gap 6  3 - 11 mEq/L     Studies/Results:  No results found.  Medications: I have reviewed the patient's current medications. I have recommended that she begin oral iron as Hemocyte/ferrous fumarate on empty stomach with OJ if tolerates.  DISCUSSION: She will have CBC when she comes for Dr Clabe Seal visit on 02-19-14, to be sure counts appear ok for last planned chemotherapy on 02-24-14; if [place] <1.5 or plt <100k on 7-30, could recheck on 8-3 or could delay x 1 week. Will give IVF on day 4.  She should avoid being outdoors in very hot weather for now. She will push po fluids.  Assessment/Plan: IA high grade serous endometrial carcinoma: post TAH/BSO/ pelvic and para-aortic node evaluation at [facility] 10-03-13 and first taxol carbo on 11-06-13. Due cycle 6 on 02-24-14 as long as ANC >=1.5 and plt >=100k within 24 hours prior to treatment, with IVF day 4 and neupogen x3.  I will see her back with  follow up labs ~ 2 weeks after last chemo. HDR completed 01-06-14. Apts Dr Sondra Come 7-30 and Dr Josephina Shih 03-13-14.  2.family history of breast and ovarian cancer, Ashkenazi Jewish descent. Genetics testing sent 12-10-13 returned normal for 24 gene OvaNext panel.  3.single episode of self-limited LLQ pain and vomiting. History suggests brief bowel kink or obstruction. No recurrence  4. psoriasis: scalp improved  5.possible allergic reaction to sulfa, which we will avoid. Tolerated dose reduced benadryl cycle 1; history does not support true allergy to that medication  6.benign left breast and axillary node biopsies, benign congenital skin area excised from neck in childhood,  remote right knee surgery, tonsillectomy, BTL, CKC.  7.internal hemorrhoids at recent colonoscopy. Try proctofoam HC for ~ a week around chemo.  8.osteoporosis: ok to hold Fosamax if nausea from chemo    All questions addressed. Chemo, IVF and neupogen orders confirmed.    Clois Montavon P, MD   02/11/2014, 3:12 PM

## 2014-02-12 ENCOUNTER — Telehealth: Payer: Self-pay | Admitting: Oncology

## 2014-02-12 NOTE — Telephone Encounter (Signed)
S/w pt gave appt for 7/30 and asked her to pick up new calendar then. Msg to chemo sched to add [place] appts for 8/4 and 8/7.

## 2014-02-13 ENCOUNTER — Other Ambulatory Visit: Payer: Self-pay

## 2014-02-13 ENCOUNTER — Telehealth: Payer: Self-pay | Admitting: *Deleted

## 2014-02-13 ENCOUNTER — Encounter: Payer: Self-pay | Admitting: Oncology

## 2014-02-13 DIAGNOSIS — C541 Malignant neoplasm of endometrium: Secondary | ICD-10-CM

## 2014-02-13 NOTE — Telephone Encounter (Signed)
Per staff message and POF I have scheduled appts. Advised scheduler of appts. Advised scheduler to move labs JMW  

## 2014-02-16 ENCOUNTER — Encounter: Payer: Self-pay | Admitting: Oncology

## 2014-02-19 ENCOUNTER — Other Ambulatory Visit: Payer: Self-pay

## 2014-02-19 ENCOUNTER — Other Ambulatory Visit: Payer: Medicare Other

## 2014-02-19 ENCOUNTER — Other Ambulatory Visit (HOSPITAL_BASED_OUTPATIENT_CLINIC_OR_DEPARTMENT_OTHER): Payer: Medicare Other

## 2014-02-19 ENCOUNTER — Telehealth: Payer: Self-pay

## 2014-02-19 ENCOUNTER — Ambulatory Visit
Admission: RE | Admit: 2014-02-19 | Discharge: 2014-02-19 | Disposition: A | Discharge status: Home / Self Care | Payer: BC Managed Care – PPO | Source: Ambulatory Visit | Attending: Radiation Oncology | Admitting: Radiation Oncology

## 2014-02-19 ENCOUNTER — Encounter: Payer: Self-pay | Admitting: Radiation Oncology

## 2014-02-19 VITALS — BP 101/59 | HR 77 | Temp 98.2°F | Ht 63.0 in | Wt 104.1 lb

## 2014-02-19 DIAGNOSIS — C541 Malignant neoplasm of endometrium: Secondary | ICD-10-CM

## 2014-02-19 DIAGNOSIS — C549 Malignant neoplasm of corpus uteri, unspecified: Secondary | ICD-10-CM

## 2014-02-19 LAB — CBC WITH DIFFERENTIAL/PLATELET
BASO%: 0.7 % (ref 0.0–2.0)
Basophils Absolute: 0 10*3/uL (ref 0.0–0.1)
EOS%: 1.3 % (ref 0.0–7.0)
Eosinophils Absolute: 0 10*3/uL (ref 0.0–0.5)
HCT: 31.2 % — ABNORMAL LOW (ref 34.8–46.6)
HGB: 10.4 g/dL — ABNORMAL LOW (ref 11.6–15.9)
LYMPH%: 30.1 % (ref 14.0–49.7)
MCH: 33 pg (ref 25.1–34.0)
MCHC: 33.3 g/dL (ref 31.5–36.0)
MCV: 99 fL (ref 79.5–101.0)
MONO#: 0.3 10*3/uL (ref 0.1–0.9)
MONO%: 11.3 % (ref 0.0–14.0)
NEUT#: 1.7 10*3/uL (ref 1.5–6.5)
NEUT%: 56.6 % (ref 38.4–76.8)
Platelets: 178 10*3/uL (ref 145–400)
RBC: 3.15 10*6/uL — ABNORMAL LOW (ref 3.70–5.45)
RDW: 15.1 % — ABNORMAL HIGH (ref 11.2–14.5)
WBC: 3 10*3/uL — ABNORMAL LOW (ref 3.9–10.3)
lymph#: 0.9 10*3/uL (ref 0.9–3.3)

## 2014-02-19 LAB — COMPREHENSIVE METABOLIC PANEL (CC13)
ALT: 16 U/L (ref 0–55)
AST: 21 U/L (ref 5–34)
Albumin: 3.5 g/dL (ref 3.5–5.0)
Alkaline Phosphatase: 66 U/L (ref 40–150)
Anion Gap: 6 mEq/L (ref 3–11)
BUN: 12.5 mg/dL (ref 7.0–26.0)
CO2: 27 mEq/L (ref 22–29)
Calcium: 9.4 mg/dL (ref 8.4–10.4)
Chloride: 104 mEq/L (ref 98–109)
Creatinine: 0.6 mg/dL (ref 0.6–1.1)
Glucose: 99 mg/dl (ref 70–140)
Potassium: 3.8 mEq/L (ref 3.5–5.1)
Sodium: 138 mEq/L (ref 136–145)
Total Bilirubin: 0.24 mg/dL (ref 0.20–1.20)
Total Protein: 7 g/dL (ref 6.4–8.3)

## 2014-02-19 NOTE — Progress Notes (Signed)
Veronica Ward here for follow up after brachytherapy treatment.  She denies pain except for occasional pains in her left lower abdomen.  She denies nausea, poor appetite, vaginal/rectal bleeding, bladder issues and diarrhea.  She will be having her last round of chemotherapy on Tuesday.

## 2014-02-19 NOTE — Progress Notes (Signed)
Radiation Oncology         [phone] ________________________________  Name: Veronica Ward MRN: [id number]  Date: 02/19/2014  DOB: 1945-10-16  Follow-Up Visit Note  CC: [name], MD  Marti Sleigh *  Diagnosis:   Endometrial cancer   Primary site: Corpus Uteri - Carcinoma   Staging method: AJCC 7th Edition   Clinical: Stage IA (T1a, N0, M0)   Pathologic: Stage IA (T1a, N0, M0)   Summary: Stage IA (T1a, N0, M0)   Interval Since Last Radiation:  6  weeks  Narrative:  The patient returns today for routine follow-up.  She is doing well at this time. She has one more cycle of chemotherapy to complete her adjuvant treatment. She denies any vaginal bleeding or hematuria or rectal bleeding. She occasionally will notice some discomfort in the left lower quadrant. She denies any nausea or severe abdominal pain.                              ALLERGIES:  is allergic to bactrim; oxycodone-acetaminophen; and sulfa antibiotics.  Meds: Current Outpatient Prescriptions  Medication Sig Dispense Refill  . FOLBIC 2.5-25-2 MG TABS TAKE 1 TABLET BY MOUTH DAILY  30 each  3  . HEMOCYTE 324 MG TABS tablet Take 1 tablet (106 mg of iron total) by mouth daily.  30 each  2  . LORazepam (ATIVAN) 1 MG tablet 1/2 to 1 tablet  Under tongue or by mouth every 6 hours as needed for nausea.  20 tablet  0  . Multiple Vitamin (MULTIVITAMIN) tablet Take 1 tablet by mouth daily.      . Ascorbic Acid (VITAMIN C) 100 MG tablet Take 100 mg by mouth daily.      . Biotin 1 MG CAPS Take 1 tablet by mouth daily.      . cholecalciferol (VITAMIN D) 400 UNITS TABS Take by mouth.      . co-enzyme Q-10 30 MG capsule Take 30 mg by mouth 3 (three) times daily.      Marland Kitchen dexamethasone (DECADRON) 4 MG tablet Take 5 tablets (=20mg ) with food 12 hours and 6 hours prior to taxol  20 tablet  0  . fish oil-omega-3 fatty acids 1000 MG capsule Take by mouth 2 (two) times daily.       Marland Kitchen HYDROcodone-acetaminophen (VICODIN) 5-500  MG per tablet Take 1 tablet by mouth every 6 (six) hours as needed for pain.      . hydrocortisone-pramoxine (PROCTOFOAM-HC) rectal foam Place 1 applicator rectally 2 (two) times daily as needed for hemorrhoids.  20 g  1  . ibuprofen (ADVIL,MOTRIN) 200 MG tablet Take 200 mg by mouth as needed for pain.      Marland Kitchen loratadine (CLARITIN) 10 MG tablet Take 10 mg by mouth daily as needed for allergies.      . magnesium 30 MG tablet Take 30 mg by mouth daily.      . ondansetron (ZOFRAN) 8 MG tablet 1-2 tablets every 12 hours as needed for nausea. Will not make drowsy. Also take 1 tablet AM after chemo whether or not any nausea  30 tablet  1  . polyethylene glycol (MIRALAX / GLYCOLAX) packet Take 17 g by mouth daily as needed.      . senna-docusate (SENOKOT S) 8.6-50 MG per tablet Take 2 tablets by mouth at bedtime.       . triamcinolone (KENALOG) 0.1 % paste Use as directed 1 application  in the mouth or throat 2 (two) times daily. Apply to tongue at bedtime or twice a day  5 g  1   No current facility-administered medications for this encounter.    Physical Findings: The patient is in no acute distress. Patient is alert and oriented.  height is 5\' 3"  (1.6 m) and weight is 104 lb 1.6 oz (47.219 kg). Her oral temperature is 98.2 F (36.8 C). Her blood pressure is 101/59 and her pulse is 77. Marland Kitchen  No palpable supraclavicular or axillary adenopathy. The lungs are clear to auscultation. The heart has regular rhythm and rate. The abdomen is soft and nontender with normal bowel sounds. A pelvic exam is not performed in light of patient's recent completion of treatment.  Lab Findings: Lab Results  Component Value Date   WBC 5.9 02/11/2014   HGB 9.7* 02/11/2014   HCT 29.6* 02/11/2014   MCV 100.1 02/11/2014   PLT 113* 02/11/2014      Radiographic Findings: No results found.  Impression:  The patient is recovering from the effects of radiation.    Plan:  Routine followup in November. In the interim the patient  will be seen by medical oncology and gynecologic oncology. Today the patient was given a vaginal dilator and instructions on its use in light of her intracavitary brachytherapy treatments.  ____________________________________ Blair Promise, MD

## 2014-02-19 NOTE — Telephone Encounter (Signed)
Told Veronica Ward that her counts today are fine for her treatment on 02-24-14.  ANC = 1.7 and Plt count = 178K.  Veronica Ward verbalized understanding.  Reviewed taking decadron premedication at 10 pm 02-23-14 and 4 am on 02-24-14.

## 2014-02-19 NOTE — Telephone Encounter (Signed)
Message copied by Baruch Merl on Thu Feb 19, 2014  3:17 PM ------      Message from: Evlyn Clines P      Created: Wed Feb 11, 2014  2:06 PM       For CBC 7-30 when she comes to see Dr Sondra Come. Need to let her know if it looks ok for RX 8-4 ------

## 2014-02-20 ENCOUNTER — Ambulatory Visit: Payer: Medicare Other

## 2014-02-20 ENCOUNTER — Other Ambulatory Visit: Payer: Medicare Other

## 2014-02-23 DIAGNOSIS — C549 Malignant neoplasm of corpus uteri, unspecified: Secondary | ICD-10-CM | POA: Diagnosis not present

## 2014-02-24 ENCOUNTER — Other Ambulatory Visit (HOSPITAL_BASED_OUTPATIENT_CLINIC_OR_DEPARTMENT_OTHER): Payer: Medicare Other

## 2014-02-24 ENCOUNTER — Ambulatory Visit (HOSPITAL_BASED_OUTPATIENT_CLINIC_OR_DEPARTMENT_OTHER): Payer: Medicare Other

## 2014-02-24 ENCOUNTER — Other Ambulatory Visit: Payer: Medicare Other

## 2014-02-24 VITALS — BP 117/62 | HR 71 | Temp 98.7°F | Resp 20

## 2014-02-24 DIAGNOSIS — C549 Malignant neoplasm of corpus uteri, unspecified: Secondary | ICD-10-CM

## 2014-02-24 DIAGNOSIS — C541 Malignant neoplasm of endometrium: Secondary | ICD-10-CM

## 2014-02-24 DIAGNOSIS — Z5111 Encounter for antineoplastic chemotherapy: Secondary | ICD-10-CM | POA: Diagnosis not present

## 2014-02-24 LAB — CBC WITH DIFFERENTIAL/PLATELET
BASO%: 0.1 % (ref 0.0–2.0)
Basophils Absolute: 0 10*3/uL (ref 0.0–0.1)
EOS%: 0 % (ref 0.0–7.0)
Eosinophils Absolute: 0 10*3/uL (ref 0.0–0.5)
HCT: 32.4 % — ABNORMAL LOW (ref 34.8–46.6)
HGB: 10.7 g/dL — ABNORMAL LOW (ref 11.6–15.9)
LYMPH%: 9.3 % — ABNORMAL LOW (ref 14.0–49.7)
MCH: 33.1 pg (ref 25.1–34.0)
MCHC: 33.2 g/dL (ref 31.5–36.0)
MCV: 99.9 fL (ref 79.5–101.0)
MONO#: 0 10*3/uL — ABNORMAL LOW (ref 0.1–0.9)
MONO%: 0.9 % (ref 0.0–14.0)
NEUT#: 2.7 10*3/uL (ref 1.5–6.5)
NEUT%: 89.7 % — ABNORMAL HIGH (ref 38.4–76.8)
Platelets: 230 10*3/uL (ref 145–400)
RBC: 3.24 10*6/uL — ABNORMAL LOW (ref 3.70–5.45)
RDW: 15.6 % — ABNORMAL HIGH (ref 11.2–14.5)
WBC: 3 10*3/uL — ABNORMAL LOW (ref 3.9–10.3)
lymph#: 0.3 10*3/uL — ABNORMAL LOW (ref 0.9–3.3)

## 2014-02-24 MED ORDER — LORAZEPAM 1 MG PO TABS
1.0000 mg | ORAL_TABLET | Freq: Once | ORAL | Status: AC | PRN
  Administered 2014-02-24: 1 mg via ORAL

## 2014-02-24 MED ORDER — DEXAMETHASONE SODIUM PHOSPHATE 20 MG/5ML IJ SOLN
20.0000 mg | Freq: Once | INTRAMUSCULAR | Status: AC
  Administered 2014-02-24: 20 mg via INTRAVENOUS

## 2014-02-24 MED ORDER — ONDANSETRON 16 MG/50ML IVPB (CHCC)
INTRAVENOUS | Status: AC
  Filled 2014-02-24: qty 16

## 2014-02-24 MED ORDER — FAMOTIDINE IN NACL 20-0.9 MG/50ML-% IV SOLN
20.0000 mg | Freq: Once | INTRAVENOUS | Status: AC
  Administered 2014-02-24: 20 mg via INTRAVENOUS

## 2014-02-24 MED ORDER — CARBOPLATIN CHEMO INJECTION 450 MG/45ML
319.0000 mg | Freq: Once | INTRAVENOUS | Status: AC
  Administered 2014-02-24: 320 mg via INTRAVENOUS
  Filled 2014-02-24: qty 32

## 2014-02-24 MED ORDER — LORAZEPAM 1 MG PO TABS
ORAL_TABLET | ORAL | Status: AC
Start: 2014-02-24 — End: 2014-02-24
  Filled 2014-02-24: qty 1

## 2014-02-24 MED ORDER — PACLITAXEL CHEMO INJECTION 300 MG/50ML
175.0000 mg/m2 | Freq: Once | INTRAVENOUS | Status: AC
  Administered 2014-02-24: 252 mg via INTRAVENOUS
  Filled 2014-02-24: qty 42

## 2014-02-24 MED ORDER — FAMOTIDINE IN NACL 20-0.9 MG/50ML-% IV SOLN
INTRAVENOUS | Status: AC
  Filled 2014-02-24: qty 50

## 2014-02-24 MED ORDER — DEXAMETHASONE SODIUM PHOSPHATE 20 MG/5ML IJ SOLN
INTRAMUSCULAR | Status: AC
  Filled 2014-02-24: qty 5

## 2014-02-24 MED ORDER — DIPHENHYDRAMINE HCL 12.5 MG/5ML PO ELIX
12.5000 mg | ORAL_SOLUTION | Freq: Once | ORAL | Status: AC
  Administered 2014-02-24: 12.5 mg via ORAL
  Filled 2014-02-24: qty 5

## 2014-02-24 MED ORDER — SODIUM CHLORIDE 0.9 % IV SOLN
Freq: Once | INTRAVENOUS | Status: AC
  Administered 2014-02-24: 10:00:00 via INTRAVENOUS

## 2014-02-24 MED ORDER — ONDANSETRON 16 MG/50ML IVPB (CHCC)
16.0000 mg | Freq: Once | INTRAVENOUS | Status: AC
  Administered 2014-02-24: 16 mg via INTRAVENOUS

## 2014-02-24 NOTE — Patient Instructions (Signed)
[facility] Discharge Instructions for Patients Receiving Chemotherapy  Today you received the following chemotherapy agents:  Taxol and Carboplatin  To help prevent nausea and vomiting after your treatment, we encourage you to take your nausea medication as ordered per MD.   If you develop nausea and vomiting that is not controlled by your nausea medication, call the clinic.   BELOW ARE SYMPTOMS THAT SHOULD BE REPORTED IMMEDIATELY:  *FEVER GREATER THAN 100.5 F  *CHILLS WITH OR WITHOUT FEVER  NAUSEA AND VOMITING THAT IS NOT CONTROLLED WITH YOUR NAUSEA MEDICATION  *UNUSUAL SHORTNESS OF BREATH  *UNUSUAL BRUISING OR BLEEDING  TENDERNESS IN MOUTH AND THROAT WITH OR WITHOUT PRESENCE OF ULCERS  *URINARY PROBLEMS  *BOWEL PROBLEMS  UNUSUAL RASH Items with * indicate a potential emergency and should be followed up as soon as possible.  Feel free to call the clinic you have any questions or concerns. The clinic phone number is [phone].    

## 2014-02-25 ENCOUNTER — Ambulatory Visit: Payer: Medicare Other

## 2014-02-26 ENCOUNTER — Ambulatory Visit: Payer: Medicare Other

## 2014-02-27 ENCOUNTER — Ambulatory Visit (HOSPITAL_BASED_OUTPATIENT_CLINIC_OR_DEPARTMENT_OTHER): Payer: Medicare Other

## 2014-02-27 ENCOUNTER — Ambulatory Visit: Payer: Medicare Other

## 2014-02-27 ENCOUNTER — Other Ambulatory Visit: Payer: Medicare Other

## 2014-02-27 ENCOUNTER — Other Ambulatory Visit: Payer: Self-pay | Admitting: *Deleted

## 2014-02-27 DIAGNOSIS — C541 Malignant neoplasm of endometrium: Secondary | ICD-10-CM

## 2014-02-27 DIAGNOSIS — C549 Malignant neoplasm of corpus uteri, unspecified: Secondary | ICD-10-CM

## 2014-02-27 MED ORDER — HEPARIN SOD (PORK) LOCK FLUSH 100 UNIT/ML IV SOLN
500.0000 [IU] | Freq: Once | INTRAVENOUS | Status: AC
  Administered 2014-02-27: 500 [IU] via INTRAVENOUS
  Filled 2014-02-27: qty 5

## 2014-02-27 MED ORDER — ONDANSETRON 8 MG/50ML IVPB (CHCC)
8.0000 mg | Freq: Once | INTRAVENOUS | Status: AC | PRN
  Administered 2014-02-27: 8 mg via INTRAVENOUS

## 2014-02-27 MED ORDER — SODIUM CHLORIDE 0.9 % IV SOLN
Freq: Once | INTRAVENOUS | Status: AC
  Administered 2014-02-27: 13:00:00 via INTRAVENOUS

## 2014-02-27 MED ORDER — SODIUM CHLORIDE 0.9 % IJ SOLN
10.0000 mL | INTRAMUSCULAR | Status: DC | PRN
  Administered 2014-02-27: 10 mL via INTRAVENOUS
  Filled 2014-02-27: qty 10

## 2014-02-27 MED ORDER — DEXAMETHASONE SODIUM PHOSPHATE 10 MG/ML IJ SOLN
INTRAMUSCULAR | Status: AC
  Filled 2014-02-27: qty 1

## 2014-02-27 MED ORDER — ONDANSETRON 8 MG/NS 50 ML IVPB
INTRAVENOUS | Status: AC
  Filled 2014-02-27: qty 8

## 2014-02-27 MED ORDER — DEXAMETHASONE SODIUM PHOSPHATE 10 MG/ML IJ SOLN
4.0000 mg | Freq: Once | INTRAMUSCULAR | Status: AC | PRN
  Administered 2014-02-27: 4 mg via INTRAVENOUS

## 2014-03-01 ENCOUNTER — Encounter: Payer: Self-pay | Admitting: Radiation Oncology

## 2014-03-01 NOTE — Progress Notes (Signed)
  Radiation Oncology         [phone] ________________________________  Name: JAELYNE DEEG MRN: [id number]  Date: 03/01/2014  DOB: [birth year]  End of Treatment Note  Diagnosis:   Endometrial cancer -serous carcinoma   Primary site: Corpus Uteri - Carcinoma   Staging method: AJCC 7th Edition   Clinical: Stage IA (T1a, N0, M0)   Pathologic: Stage IA (T1a, N0, M0)   Summary: Stage IA (T1a, N0, M0)   Indication for treatment:  Risk for vaginal vault recurrence       Radiation treatment dates:   May 19, may 26th, June 2, June 9, June 16  Site/dose:   Proximal vagina, 30 gray in 5 fractions, 4.0 CM treatment length  Beams/energy:   High dose rate intracavitary brachytherapy treatments using a 3.0 cm diameter vaginal cylinder, iridium 192 as the high-dose-rate source.  Narrative: The patient tolerated radiation treatment relatively well.   She had some mild discomfort with the insertion of the cylinder and mild urinary symptoms  Plan: The patient has completed radiation treatment. The patient will return to radiation oncology clinic for routine followup in one month. I advised them to call or return sooner if they have any questions or concerns related to their recovery or treatment.  -----------------------------------  Blair Promise, PhD, MD

## 2014-03-02 ENCOUNTER — Ambulatory Visit (HOSPITAL_BASED_OUTPATIENT_CLINIC_OR_DEPARTMENT_OTHER): Payer: Medicare Other

## 2014-03-02 VITALS — BP 107/59 | HR 88 | Temp 98.9°F

## 2014-03-02 DIAGNOSIS — Z5189 Encounter for other specified aftercare: Secondary | ICD-10-CM

## 2014-03-02 DIAGNOSIS — C549 Malignant neoplasm of corpus uteri, unspecified: Secondary | ICD-10-CM | POA: Diagnosis not present

## 2014-03-02 DIAGNOSIS — C541 Malignant neoplasm of endometrium: Secondary | ICD-10-CM

## 2014-03-02 MED ORDER — TBO-FILGRASTIM 300 MCG/0.5ML ~~LOC~~ SOSY
300.0000 ug | PREFILLED_SYRINGE | Freq: Once | SUBCUTANEOUS | Status: AC
  Administered 2014-03-02: 300 ug via SUBCUTANEOUS
  Filled 2014-03-02: qty 0.5

## 2014-03-03 ENCOUNTER — Ambulatory Visit (HOSPITAL_BASED_OUTPATIENT_CLINIC_OR_DEPARTMENT_OTHER): Payer: Medicare Other

## 2014-03-03 VITALS — BP 104/59 | HR 80

## 2014-03-03 DIAGNOSIS — C541 Malignant neoplasm of endometrium: Secondary | ICD-10-CM

## 2014-03-03 DIAGNOSIS — Z5189 Encounter for other specified aftercare: Secondary | ICD-10-CM

## 2014-03-03 DIAGNOSIS — C549 Malignant neoplasm of corpus uteri, unspecified: Secondary | ICD-10-CM | POA: Diagnosis not present

## 2014-03-03 MED ORDER — TBO-FILGRASTIM 300 MCG/0.5ML ~~LOC~~ SOSY
300.0000 ug | PREFILLED_SYRINGE | Freq: Once | SUBCUTANEOUS | Status: AC
  Administered 2014-03-03: 300 ug via SUBCUTANEOUS
  Filled 2014-03-03: qty 0.5

## 2014-03-03 NOTE — Patient Instructions (Signed)

## 2014-03-04 ENCOUNTER — Ambulatory Visit (HOSPITAL_BASED_OUTPATIENT_CLINIC_OR_DEPARTMENT_OTHER): Payer: Medicare Other

## 2014-03-04 VITALS — BP 138/53 | HR 91 | Temp 98.8°F

## 2014-03-04 DIAGNOSIS — C549 Malignant neoplasm of corpus uteri, unspecified: Secondary | ICD-10-CM

## 2014-03-04 DIAGNOSIS — Z5189 Encounter for other specified aftercare: Secondary | ICD-10-CM

## 2014-03-04 DIAGNOSIS — C541 Malignant neoplasm of endometrium: Secondary | ICD-10-CM

## 2014-03-04 MED ORDER — TBO-FILGRASTIM 300 MCG/0.5ML ~~LOC~~ SOSY
300.0000 ug | PREFILLED_SYRINGE | Freq: Once | SUBCUTANEOUS | Status: AC
  Administered 2014-03-04: 300 ug via SUBCUTANEOUS
  Filled 2014-03-04: qty 0.5

## 2014-03-05 ENCOUNTER — Other Ambulatory Visit: Payer: Self-pay | Admitting: Family Medicine

## 2014-03-08 ENCOUNTER — Other Ambulatory Visit: Payer: Self-pay | Admitting: Oncology

## 2014-03-09 ENCOUNTER — Encounter: Payer: Self-pay | Admitting: Oncology

## 2014-03-09 ENCOUNTER — Other Ambulatory Visit (HOSPITAL_BASED_OUTPATIENT_CLINIC_OR_DEPARTMENT_OTHER): Payer: Medicare Other

## 2014-03-09 ENCOUNTER — Telehealth: Payer: Self-pay | Admitting: Oncology

## 2014-03-09 ENCOUNTER — Ambulatory Visit (HOSPITAL_BASED_OUTPATIENT_CLINIC_OR_DEPARTMENT_OTHER): Payer: Medicare Other | Admitting: Oncology

## 2014-03-09 VITALS — BP 123/60 | HR 101 | Temp 98.5°F | Resp 20 | Ht 63.0 in | Wt 103.5 lb

## 2014-03-09 DIAGNOSIS — C549 Malignant neoplasm of corpus uteri, unspecified: Secondary | ICD-10-CM | POA: Diagnosis not present

## 2014-03-09 DIAGNOSIS — G620 Drug-induced polyneuropathy: Secondary | ICD-10-CM | POA: Diagnosis not present

## 2014-03-09 DIAGNOSIS — D630 Anemia in neoplastic disease: Secondary | ICD-10-CM

## 2014-03-09 DIAGNOSIS — C541 Malignant neoplasm of endometrium: Secondary | ICD-10-CM

## 2014-03-09 LAB — CBC WITH DIFFERENTIAL/PLATELET
BASO%: 0.4 % (ref 0.0–2.0)
Basophils Absolute: 0 10*3/uL (ref 0.0–0.1)
EOS%: 2 % (ref 0.0–7.0)
Eosinophils Absolute: 0.1 10*3/uL (ref 0.0–0.5)
HCT: 31.6 % — ABNORMAL LOW (ref 34.8–46.6)
HGB: 10.4 g/dL — ABNORMAL LOW (ref 11.6–15.9)
LYMPH%: 16.4 % (ref 14.0–49.7)
MCH: 33.1 pg (ref 25.1–34.0)
MCHC: 33 g/dL (ref 31.5–36.0)
MCV: 100.2 fL (ref 79.5–101.0)
MONO#: 0.5 10*3/uL (ref 0.1–0.9)
MONO%: 9.3 % (ref 0.0–14.0)
NEUT#: 4 10*3/uL (ref 1.5–6.5)
NEUT%: 71.9 % (ref 38.4–76.8)
Platelets: 154 10*3/uL (ref 145–400)
RBC: 3.15 10*6/uL — ABNORMAL LOW (ref 3.70–5.45)
RDW: 14.9 % — ABNORMAL HIGH (ref 11.2–14.5)
WBC: 5.6 10*3/uL (ref 3.9–10.3)
lymph#: 0.9 10*3/uL (ref 0.9–3.3)

## 2014-03-09 LAB — COMPREHENSIVE METABOLIC PANEL (CC13)
ALT: 19 U/L (ref 0–55)
AST: 23 U/L (ref 5–34)
Albumin: 3.7 g/dL (ref 3.5–5.0)
Alkaline Phosphatase: 69 U/L (ref 40–150)
Anion Gap: 8 mEq/L (ref 3–11)
BUN: 13.8 mg/dL (ref 7.0–26.0)
CO2: 27 mEq/L (ref 22–29)
Calcium: 9.3 mg/dL (ref 8.4–10.4)
Chloride: 103 mEq/L (ref 98–109)
Creatinine: 0.7 mg/dL (ref 0.6–1.1)
Glucose: 118 mg/dl (ref 70–140)
Potassium: 3.5 mEq/L (ref 3.5–5.1)
Sodium: 138 mEq/L (ref 136–145)
Total Bilirubin: <0.2 mg/dL (ref 0.20–1.20)
Total Protein: 6.9 g/dL (ref 6.4–8.3)

## 2014-03-09 NOTE — Progress Notes (Signed)
OFFICE PROGRESS NOTE   03/09/2014   Physicians:Soper, John,Tower, Kirkland; Constant, Peggy; Kinard, Jeneen Rinks; Laurann Montana, Tull [facility]); Mellody Drown (gyn onc [facility]);Kaplan, Carman Ching, Josph Macho   INTERVAL HISTORY:  Patient is seen, together with husband, now having completed 6 cycles of adjuvant carboplatin and taxol chemotherapy 02-24-14 for T1aN0 high grade serous endometrial carcinoma.  She had a more difficult time with the last treatment than any previously, with increased neuropathy in feet, nausea and weakness. GI symptoms improved after a week and neuropathy in feet is gradually improving. She was fatigued after she tried to walk outdoors late last week, and felt more SOB this AM without other associated symptoms which has resolved now. Bowels are mostly moving once daily without senokot S now. She has been more emotional and anxious about physical symptoms since the last treatment.  She does not have PAC. Genetics testing with OvaNext gene panel was normal 11-2013.  Husband is going to conference in [place] in 2 weeks. Patient has weekend trip Sept 18-20.   ONCOLOGIC HISTORY Patient had been in usual good health until she noticed vaginal spotting in Feb 2015. She was seen by PCP Dr Glori Bickers, with endometrial biopsy 09-17-13 by Dr Elly Modena [id number]) endometrial adenocarcinoma, which appeared to be grade II. She had pelvic and transvaginal US 09-23-13. She was seen in consultation by Dr Josephina Shih, then referred to Dr Clarene Essex at [facility] for earliest surgical availability. She had preoperative CXR at [facility]. Surgery 10-03-13 was robotic assisted total laparoscopic hysterectomy/ BSO/ bilateral pelvic and para-aortic nodes with sentinel node evaluation, tolerated well and patient discharged home on POD #1. [facility] surgical pathology [phone]) showed serous carcinoma FIGO grade 3 without myometrial invasion, no adnexal, serosal or cervical involvement, no LVSI, 0/27 nodes involved (9 para-aortic, 18 pelvic),  pT1a N0. She saw Dr Clarene Essex in follow up on 10-05-13, with recommendation for 6 cycles of adjuvant carboplatin and taxol chemotherapy and brachytherapy; she also had consultation with radiation oncology at [facility]. Chemotherapy education was done at [facility] 10-16-13. She had second opinion consultation by Dr Fransisca Connors at [facility]. First taxol carboplatin was given 11-06-13. She had high dose rate brachytherapy x5 from 12-09-13 thru 01-06-14. She completed cycle 6 carboplatin taxol on 02-24-14.   Review of systems as above, also: Appetite good, taste all normal. No cough, no chest pain, no palpitations. No LE swelling. Some "tingling of head, not usual headache" after last chemo, resolved. No fever or symptoms of infection.  Remainder of 10 point Review of Systems negative.  Objective:  Vital signs in last 24 hours:  BP 123/60  Pulse 101  Temp(Src) 98.5 F (36.9 C) (Oral)  Resp 20  Ht 5\' 3"  (1.6 m)  Wt 103 lb 8 oz (46.947 kg)  BMI 18.34 kg/m2  SpO2 100%  LMP 09/17/2000 weight is stable.  Alert, oriented and appropriate. Ambulatory without difficulty. She appears somewhat anxious, but overall not in any discomfort. Alopecia  HEENT:PERRL, sclerae not icteric. Oral mucosa moist without lesions Neck supple. No JVD.  Lymphatics:no supraclavicular adenopathy Resp: clear to auscultation bilaterally and normal percussion bilaterally. Respirations not labored RA. Cardio: regular rate and rhythm. No gallop. GI: soft, nontender, not distended. Normally active bowel sounds. Surgical incision not remarkable. Musculoskeletal/ Extremities: without pitting edema, cords, tenderness Neuro: slight numbness heels bilaterally  Otherwise nonfocal PSYCH appropriate mood and affect Skin without rash, ecchymosis, petechiae   Lab Results:  Results for orders placed in visit on 03/09/14  CBC WITH DIFFERENTIAL  Result Value Ref Range   WBC 5.6  3.9 - 10.3 10e3/uL   NEUT# 4.0  1.5 - 6.5 10e3/uL    HGB 10.4 (*) 11.6 - 15.9 g/dL   HCT 31.6 (*) 34.8 - 46.6 %   Platelets 154  145 - 400 10e3/uL   MCV 100.2  79.5 - 101.0 fL   MCH 33.1  25.1 - 34.0 pg   MCHC 33.0  31.5 - 36.0 g/dL   RBC 3.15 (*) 3.70 - 5.45 10e6/uL   RDW 14.9 (*) 11.2 - 14.5 %   lymph# 0.9  0.9 - 3.3 10e3/uL   MONO# 0.5  0.1 - 0.9 10e3/uL   Eosinophils Absolute 0.1  0.0 - 0.5 10e3/uL   Basophils Absolute 0.0  0.0 - 0.1 10e3/uL   NEUT% 71.9  38.4 - 76.8 %   LYMPH% 16.4  14.0 - 49.7 %   MONO% 9.3  0.0 - 14.0 %   EOS% 2.0  0.0 - 7.0 %   BASO% 0.4  0.0 - 2.0 %  COMPREHENSIVE METABOLIC PANEL (EU23)      Result Value Ref Range   Sodium 138  136 - 145 mEq/L   Potassium 3.5  3.5 - 5.1 mEq/L   Chloride 103  98 - 109 mEq/L   CO2 27  22 - 29 mEq/L   Glucose 118  70 - 140 mg/dl   BUN 13.8  7.0 - 26.0 mg/dL   Creatinine 0.7  0.6 - 1.1 mg/dL   Total Bilirubin <0.20  0.20 - 1.20 mg/dL   Alkaline Phosphatase 69  40 - 150 U/L   AST 23  5 - 34 U/L   ALT 19  0 - 55 U/L   Total Protein 6.9  6.4 - 8.3 g/dL   Albumin 3.7  3.5 - 5.0 g/dL   Calcium 9.3  8.4 - 10.4 mg/dL   Anion Gap 8  3 - 11 mEq/L     Studies/Results:  No results found. I do not know that she had CTs done at time of diagnosis, either in [place] or at [facility].  Medications: I have reviewed the patient's current medications. She will stop iron when she finishes present tablets, tho may need to resume depending on hemoglobin with next labs. She can gradually resume supplements but will wait another 2-4 weeks before restarting Fosamax.  DISCUSSION: Patient has a long list of questions, all of which we have discussed. She understands that she should progressively feel stronger and more normal over next few months, but may not be completely back to good baseline for 4-6 months. Hopefully the neuropathy in feet from taxol will continue to improve, and she is encouraged to massage and exercise feet. Counts are improved and should not drop again, so ok to be with  grandchildren, eat seafood etc. OK for visit with Dr Glori Bickers this fall and mammograms ~ Nov on schedule. Discussed gradually increasing exercise. Discussed fact that chemotherapy has given her sense of security and that often patients feel more anxious after they complete treatments, at least initially. Suggested support groups including Finding Your New Normal.  Husband is very supportive.  Assessment/Plan:  1.IA high grade serous endometrial carcinoma: post TAH/BSO/ pelvic and para-aortic node evaluation at [facility] 10-03-13 and  taxol carbo x 6 cycles from 11-06-13 thru 02-24-14 . HDR completed 01-06-14. Apts Dr Josephina Shih 03-13-14 and Dr Sondra Come in Nov. Not discussed with patient, but I wonder if she might need CT scans for baseline, as I do not believe  she has had CTs thus far; I expect gyn oncology will suggest scans if appropriate.  I will see her back in 4-6 weeks with labs in follow up of chemotherapy related concerns. 2.family history of breast and ovarian cancer, Ashkenazi Jewish descent. Genetics testing sent 12-10-13 returned normal for 24 gene OvaNext panel.  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 4. Anemia related to chemotherapy: not more symptomatic and should improve given time off treatment. Follow. 5. SOB this am unclear etiology. Has resolved, exam benign. Follow. 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 psoriases     Time spent 25 min, including >50% counseling and coordination of care. She knows to call prior to next scheduled visit if needed.    LIVESAY,LENNIS P, MD   03/09/2014, 4:12 PM

## 2014-03-09 NOTE — Telephone Encounter (Signed)
Sent records to [facility]

## 2014-03-09 NOTE — Progress Notes (Signed)
[facility] END OF TREATMENT   Name: NEALY HICKMON Date: 03/09/2014 MRN: [id number] DOB: 25-Nov-1945   TREATMENT DATES: 11-06-13 thru 02-24-14   REFERRING PHYSICIAN: Cindie Laroche, MD  DIAGNOSIS:  high grade serous endometrial carcinoma  STAGE AT START OF TREATMENT: IA   INTENT: cure   DRUGS OR REGIMENS GIVEN: Carboplatin taxol every 3 weeks   MAJOR TOXICITIES: fatigue, nausea, peripheral neuropathy in feet   REASON TREATMENT STOPPED: completion of planned course   PERFORMANCE STATUS AT END: ECOG 1   ONGOING PROBLEMS: fatigue, neuropathy in feet   FOLLOW UP PLANS: gyn oncology, medical oncology and radiation oncology follow up

## 2014-03-09 NOTE — Telephone Encounter (Signed)
, °

## 2014-03-13 ENCOUNTER — Encounter: Payer: Self-pay | Admitting: Gynecology

## 2014-03-13 ENCOUNTER — Ambulatory Visit: Payer: Medicare Other | Attending: Gynecology | Admitting: Gynecology

## 2014-03-13 VITALS — Resp 18 | Wt 104.3 lb

## 2014-03-13 DIAGNOSIS — G4700 Insomnia, unspecified: Secondary | ICD-10-CM | POA: Diagnosis not present

## 2014-03-13 DIAGNOSIS — R110 Nausea: Secondary | ICD-10-CM | POA: Diagnosis not present

## 2014-03-13 DIAGNOSIS — C549 Malignant neoplasm of corpus uteri, unspecified: Secondary | ICD-10-CM | POA: Insufficient documentation

## 2014-03-13 DIAGNOSIS — Z9079 Acquired absence of other genital organ(s): Secondary | ICD-10-CM | POA: Diagnosis not present

## 2014-03-13 DIAGNOSIS — Z8541 Personal history of malignant neoplasm of cervix uteri: Secondary | ICD-10-CM | POA: Diagnosis not present

## 2014-03-13 DIAGNOSIS — Z9221 Personal history of antineoplastic chemotherapy: Secondary | ICD-10-CM | POA: Diagnosis not present

## 2014-03-13 DIAGNOSIS — Z923 Personal history of irradiation: Secondary | ICD-10-CM | POA: Insufficient documentation

## 2014-03-13 DIAGNOSIS — F411 Generalized anxiety disorder: Secondary | ICD-10-CM | POA: Diagnosis not present

## 2014-03-13 DIAGNOSIS — K5900 Constipation, unspecified: Secondary | ICD-10-CM | POA: Insufficient documentation

## 2014-03-13 DIAGNOSIS — C541 Malignant neoplasm of endometrium: Secondary | ICD-10-CM

## 2014-03-13 DIAGNOSIS — Z90710 Acquired absence of both cervix and uterus: Secondary | ICD-10-CM | POA: Diagnosis not present

## 2014-03-13 NOTE — Patient Instructions (Signed)
Plan to see Dr. Fermin Schwab in Feb 2016 or sooner if needed.  Please call for any questions or concerns.  Please call to schedule your appt after you see Dr. Sondra Come.

## 2014-03-13 NOTE — Progress Notes (Signed)
Consult Note: Gyn-Onc   Veronica Ward 68 y.o. female  Chief Complaint  Patient presents with  . Endometrial Cancer    Assessment : Endometrial carcinoma (papillary serous) stage I a. Status post 6 cycles of carboplatin Taxol chemotherapy and vaginal vault brachytherapy. Patient's clinically free of disease.  Plan:  The patient may return to full levels of activity. I would suggest using Astro glide for vaginal lubrication. She will see Dr. Marko Plume and Sondra Come as scheduled. I plan on seeing the patient back in February 2016.  Interval history: Patient underwent robotic hysterectomy bilateral salpingo-oophorectomy and pelvic lymphadenectomy at [facility] on 10/03/2013. 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 Adjuvant therapy with carboplatin and Taxol and vaginal vault brachytherapy was advised. The patient had a complicated postoperative course. She is seen today to evaluate her vaginal cuff prior to instituting radiation therapy. Patient denies any vaginal bleeding. She is having some constipation. She does have intermittent lower nominal pain. She has minimal nausea with no vomiting. She is having some difficulty with insomnia.  HPI: 68 year old white female seen in consultation request of Dr. Mora Bellman regarding management of a newly diagnosed endometrial cancer. Approximately 3 weeks ago the patient developed some vaginal spotting. She was seen by Dr. Elly Modena on February 25. An endometrial biopsy was obtained showing a grade 2 endometrial carcinoma. The patient is also had an ultrasound of the pelvis revealing a uterus measuring 7 x 3 x 3.7 cm. The endometrial stripe was 7 mm.  [name] She has no other gynecologic history.   Final pathology following a robotic hysterectomy showed the  patient had a papillary serous carcinoma of the endometrium. She was treated postoperatively with 6 cycles of carboplatin and Taxol and vaginal vault bracytherapy. She tolerated therapy well. Review of Systems:10 point review of systems is negative except as noted in interval history.   Vitals: Resp. rate 18, weight 104 lb 4.8 oz (47.31 kg), last menstrual period 09/17/2000.  Physical Exam: General : The patient is a healthy woman in no acute distress.  HEENT: normocephalic, extraoccular movements normal; neck is supple without thyromegally  Lynphnodes: Supraclavicular and inguinal nodes not enlarged  Abdomen: Soft, non-tender, no ascites, no organomegally, no masses, no hernias  Pelvic:  EGBUS: Normal female  Vagina: Normal, no lesions, atrophic , the cuff closure is intact and is healing appropriately. Urethra and Bladder: Normal, non-tender  Cervix: Surgically absent Uterus: Surgically absent Bi-manual examination: Non-tender; no adenxal masses or nodularity  Rectal: normal sphincter tone, no masses, no blood  Lower extremities: No edema or varicosities. Normal range of motion      Allergies  Allergen Reactions  . Bactrim [Sulfamethoxazole-Trimethoprim] Rash  . Oxycodone-Acetaminophen Anxiety  . Sulfa Antibiotics Rash    Past Medical History  Diagnosis Date  . Hx of basal cell carcinoma     face  . Diffuse cystic mastopathy   . Headache(784.0)   . Unspecified hemorrhoids without mention of complication   . Other malaise and fatigue   . Disorder of bone and cartilage, unspecified   . Other psoriasis   . Other seborrheic keratosis   . Sleep disturbance, unspecified   . Predominant disturbance of emotions   . Pain in limb   . Urticaria, unspecified   . History of shingles   . Anxiety   .  Anemia   . Dysautonomia     mild  . Osteoporosis   . Endometrial cancer   . Ashkenazi Jewish ancestry   . Radiation 01/06/14, 12/23/13, 12/16/13, 12/09/13    HDR brachytherapy     Past Surgical History  Procedure Laterality Date  . Cervical conization w/bx    . Cervical cancer  1973  . Breast biopsy Left 2001    benign  . Tubal ligation    . Dexa  03/2002    oseopenia  . Colonoscopy  12/04    int. hemorrhoids  . Dexa  07/2004    decreased BMD, osteopenia  . Dexa  02/2007    Osteopenia  . Breast lumpectomy      left underarm  . Birthmark removal  childhood  . Dexa  9/10    Osteopenia slightly worse  . Felon i&d  12/2010    Dr. Fredna Dow, I&D in OR (L index finger)  . Finger surgery    . Knee surgery      right  . Robotic assisted total hysterectomy with bilateral salpingo oopherectomy  10/03/2013    Robotic-assisted total laparoscopic hysterectomy, bilateral salpingo-oophorectomy, bilateral pelvic and para-aortic lymphadenectomy with sentinel lymph node dissection  . Tonsilectomy, adenoidectomy, bilateral myringotomy and tubes    . Axillary lymph node dissection      left    Current Outpatient Prescriptions  Medication Sig Dispense Refill  . Ascorbic Acid (VITAMIN C) 100 MG tablet Take 100 mg by mouth daily.      . cholecalciferol (VITAMIN D) 400 UNITS TABS Take by mouth.      . co-enzyme Q-10 30 MG capsule Take 30 mg by mouth 3 (three) times daily.      Marland Kitchen dexamethasone (DECADRON) 4 MG tablet       . fish oil-omega-3 fatty acids 1000 MG capsule Take by mouth 2 (two) times daily.       . FOLBIC 2.5-25-2 MG TABS TAKE 1 TABLET BY MOUTH DAILY  30 each  2  . HEMOCYTE 324 MG TABS tablet Take 1 tablet (106 mg of iron total) by mouth daily.  30 each  2  . loratadine (CLARITIN) 10 MG tablet Take 10 mg by mouth daily as needed for allergies.      Marland Kitchen LORazepam (ATIVAN) 1 MG tablet 1/2 to 1 tablet  Under tongue or by mouth every 6 hours as needed for nausea.  20 tablet  0  . magnesium 30 MG tablet Take 30 mg by mouth daily.      . Multiple Vitamin (MULTIVITAMIN) tablet Take 1 tablet by mouth daily.      . ondansetron (ZOFRAN) 8 MG tablet 1-2 tablets every 12 hours  as needed for nausea. Will not make drowsy. Also take 1 tablet AM after chemo whether or not any nausea  30 tablet  1  . polyethylene glycol (MIRALAX / GLYCOLAX) packet Take 17 g by mouth daily as needed.      . senna-docusate (SENOKOT S) 8.6-50 MG per tablet Take 2 tablets by mouth at bedtime.       . triamcinolone (KENALOG) 0.1 % paste Use as directed 1 application in the mouth or throat 2 (two) times daily. Apply to tongue at bedtime or twice a day  5 g  1  . HYDROcodone-acetaminophen (VICODIN) 5-500 MG per tablet Take 1 tablet by mouth every 6 (six) hours as needed for pain.      . hydrocortisone-pramoxine (PROCTOFOAM-HC) rectal foam Place 1 applicator rectally 2 (  two) times daily as needed for hemorrhoids.  20 g  1  . ibuprofen (ADVIL,MOTRIN) 200 MG tablet Take 200 mg by mouth as needed for pain.       No current facility-administered medications for this visit.    History   Social History  . Marital Status: Married    Spouse Name: N/A    Number of Children: 3  . Years of Education: N/A   Occupational History  . Professor    Social History Main Topics  . Smoking status: Former Smoker    Quit date: 07/25/1963  . Smokeless tobacco: Never Used  . Alcohol Use: 0.0 oz/week    0 drink(s) per week     Comment: 1/2 glass of wine a day  . Drug Use: No  . Sexual Activity: Yes    Birth Control/ Protection: Post-menopausal   Other Topics Concern  . Not on file   Social History Narrative   History professor      Likes to dance      Is a walker and does floor exercises      Caffeine: 2 cups daily    Family History  Problem Relation Age of Onset  . Multiple myeloma Father   . Coronary artery disease Father   . Transient ischemic attack Mother   . Lupus Maternal Aunt   . Breast cancer Maternal Aunt     dx 68s; deceased  . Cancer Maternal Grandfather     GI cancer or pancreatic; deceased 74  . Colon cancer Neg Hx   . Ovarian cancer Maternal Aunt     dx 29s; deceased 35s   . Multiple sclerosis Brother       Alvino Chapel, MD 03/13/2014, 1:39 PM

## 2014-04-15 ENCOUNTER — Ambulatory Visit: Payer: Medicare Other

## 2014-04-17 ENCOUNTER — Other Ambulatory Visit: Payer: Self-pay

## 2014-04-17 DIAGNOSIS — C541 Malignant neoplasm of endometrium: Secondary | ICD-10-CM

## 2014-04-18 ENCOUNTER — Other Ambulatory Visit: Payer: Self-pay | Admitting: Oncology

## 2014-04-20 ENCOUNTER — Encounter: Payer: Self-pay | Admitting: Oncology

## 2014-04-20 ENCOUNTER — Ambulatory Visit (HOSPITAL_BASED_OUTPATIENT_CLINIC_OR_DEPARTMENT_OTHER): Payer: Medicare Other | Admitting: Oncology

## 2014-04-20 ENCOUNTER — Telehealth: Payer: Self-pay | Admitting: Oncology

## 2014-04-20 ENCOUNTER — Other Ambulatory Visit (HOSPITAL_BASED_OUTPATIENT_CLINIC_OR_DEPARTMENT_OTHER): Payer: Medicare Other

## 2014-04-20 VITALS — BP 131/71 | HR 77 | Temp 98.6°F | Resp 20 | Ht 63.0 in | Wt 103.8 lb

## 2014-04-20 DIAGNOSIS — D6481 Anemia due to antineoplastic chemotherapy: Secondary | ICD-10-CM

## 2014-04-20 DIAGNOSIS — T451X5A Adverse effect of antineoplastic and immunosuppressive drugs, initial encounter: Secondary | ICD-10-CM

## 2014-04-20 DIAGNOSIS — Z8041 Family history of malignant neoplasm of ovary: Secondary | ICD-10-CM

## 2014-04-20 DIAGNOSIS — C541 Malignant neoplasm of endometrium: Secondary | ICD-10-CM

## 2014-04-20 DIAGNOSIS — C549 Malignant neoplasm of corpus uteri, unspecified: Secondary | ICD-10-CM

## 2014-04-20 LAB — CBC WITH DIFFERENTIAL/PLATELET
BASO%: 0.9 % (ref 0.0–2.0)
Basophils Absolute: 0 10*3/uL (ref 0.0–0.1)
EOS%: 3.2 % (ref 0.0–7.0)
Eosinophils Absolute: 0.1 10*3/uL (ref 0.0–0.5)
HCT: 34.7 % — ABNORMAL LOW (ref 34.8–46.6)
HGB: 11.4 g/dL — ABNORMAL LOW (ref 11.6–15.9)
LYMPH%: 24.1 % (ref 14.0–49.7)
MCH: 33.2 pg (ref 25.1–34.0)
MCHC: 32.9 g/dL (ref 31.5–36.0)
MCV: 101 fL (ref 79.5–101.0)
MONO#: 0.4 10*3/uL (ref 0.1–0.9)
MONO%: 9.7 % (ref 0.0–14.0)
NEUT#: 2.7 10*3/uL (ref 1.5–6.5)
NEUT%: 62.1 % (ref 38.4–76.8)
Platelets: 188 10*3/uL (ref 145–400)
RBC: 3.44 10*6/uL — ABNORMAL LOW (ref 3.70–5.45)
RDW: 13.3 % (ref 11.2–14.5)
WBC: 4.4 10*3/uL (ref 3.9–10.3)
lymph#: 1.1 10*3/uL (ref 0.9–3.3)

## 2014-04-20 LAB — COMPREHENSIVE METABOLIC PANEL (CC13)
ALT: 15 U/L (ref 0–55)
AST: 23 U/L (ref 5–34)
Albumin: 3.8 g/dL (ref 3.5–5.0)
Alkaline Phosphatase: 63 U/L (ref 40–150)
Anion Gap: 8 mEq/L (ref 3–11)
BUN: 14.7 mg/dL (ref 7.0–26.0)
CO2: 29 mEq/L (ref 22–29)
Calcium: 9.5 mg/dL (ref 8.4–10.4)
Chloride: 106 mEq/L (ref 98–109)
Creatinine: 0.9 mg/dL (ref 0.6–1.1)
Glucose: 103 mg/dl (ref 70–140)
Potassium: 3.8 mEq/L (ref 3.5–5.1)
Sodium: 142 mEq/L (ref 136–145)
Total Bilirubin: 0.24 mg/dL (ref 0.20–1.20)
Total Protein: 7.1 g/dL (ref 6.4–8.3)

## 2014-04-20 NOTE — Progress Notes (Signed)
OFFICE PROGRESS NOTE   04/20/2014   Physicians:Soper, John/ ClarkePearson, Quillian Quince, Newkirk, Carlos; Constant, Peggy; Kinard, Jeneen Rinks; Laurann Montana, 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 She saw Dr Josephina Shih 03-13-14 and will continue gyn oncology follow up in [facility], next visit in February 2016. She will see Dr Sondra Come again in Nov. She has not had scans since completion of treatment.   Patient has felt much more energetic and well over past several weeks, with excellent appetite, bowels moving normally and no peripheral neuropathy symptoms. She enjoyed recent folk dance retreat.  She does not have PAC. Genetics testing was normal 11-2013. She expects to have flu vaccine tomorrow.   ONCOLOGIC HISTORY Patient had been in usual good health until she noticed vaginal spotting in Feb 2015. She was seen by PCP Dr Glori Bickers, with endometrial biopsy 09-17-13 by Dr Elly Modena [phone]) endometrial adenocarcinoma, which appeared to be grade II. She had pelvic and transvaginal US 09-23-13. She was seen in consultation by Dr Josephina Shih, then referred to Dr Clarene Essex at [facility] for earliest surgical availability. She had preoperative CXR at [facility]. Surgery 10-03-13 was robotic assisted total laparoscopic hysterectomy/ BSO/ bilateral pelvic and para-aortic nodes with sentinel node evaluation, tolerated well and patient discharged home on POD #1. [facility] surgical pathology [id number]) showed serous carcinoma FIGO grade 3 without myometrial invasion, no adnexal, serosal or cervical involvement, no LVSI, 0/27 nodes involved (9 para-aortic, 18 pelvic), pT1a N0. She saw Dr Clarene Essex in follow up on 10-05-13, with recommendation for 6 cycles of adjuvant carboplatin and taxol chemotherapy and  brachytherapy; she also had consultation with radiation oncology at [facility]. Chemotherapy education was done at [facility] 10-16-13. She had second opinion consultation by Dr Fransisca Connors at [facility]. First taxol carboplatin was given 11-06-13. She had high dose rate brachytherapy x5 from 12-09-13 thru 01-06-14.  She completed cycle 6 carboplatin taxol on 02-24-14.   Review of systems as above, also: No fever or symptoms of infection. Using vaginal dilator regularly. No bleeding. Bowels and bladder ok. No respiratory symptoms.  Remainder of 10 point Review of Systems negative.  Objective:  Vital signs in last 24 hours:  BP 131/71  Pulse 77  Temp(Src) 98.6 F (37 C) (Oral)  Resp 20  Ht 5\' 3"  (1.6 m)  Wt 103 lb 12.8 oz (47.083 kg)  BMI 18.39 kg/m2  LMP 09/17/2000 Weight is stable Alert, oriented and appropriate. Ambulatory without difficulty. Looks comfortable, talkative and in good spirits. Alopecia  HEENT:PERRL, sclerae not icteric. Oral mucosa moist without lesions, posterior pharynx clear.  Neck supple. No JVD.  Lymphatics:no cervical,supraclavicular, axillary or inguinal adenopathy Resp: clear to auscultation bilaterally and normal percussion bilaterally Cardio: regular rate and rhythm. No gallop. GI: soft, nontender, not distended, no mass or organomegaly. Normally active bowel sounds. Surgical incision not remarkable. Musculoskeletal/ Extremities: without pitting edema, cords, tenderness Neuro: no peripheral neuropathy. Otherwise nonfocal. PSYCH appropriate mood and affect Skin without rash, ecchymosis, petechiae Breasts: without dominant mass, skin or nipple findings. Axillae benign.  Lab Results:  Results for orders placed in visit on 04/20/14  CBC WITH DIFFERENTIAL      Result Value Ref Range   WBC 4.4  3.9 - 10.3 10e3/uL   NEUT# 2.7  1.5 - 6.5 10e3/uL   HGB 11.4 (*) 11.6 - 15.9  g/dL   HCT 34.7 (*) 34.8 - 46.6 %   Platelets 188  145 - 400 10e3/uL   MCV 101.0  79.5 -  101.0 fL   MCH 33.2  25.1 - 34.0 pg   MCHC 32.9  31.5 - 36.0 g/dL   RBC 3.44 (*) 3.70 - 5.45 10e6/uL   RDW 13.3  11.2 - 14.5 %   lymph# 1.1  0.9 - 3.3 10e3/uL   MONO# 0.4  0.1 - 0.9 10e3/uL   Eosinophils Absolute 0.1  0.0 - 0.5 10e3/uL   Basophils Absolute 0.0  0.0 - 0.1 10e3/uL   NEUT% 62.1  38.4 - 76.8 %   LYMPH% 24.1  14.0 - 49.7 %   MONO% 9.7  0.0 - 14.0 %   EOS% 3.2  0.0 - 7.0 %   BASO% 0.9  0.0 - 2.0 %  COMPREHENSIVE METABOLIC PANEL (AS34)      Result Value Ref Range   Sodium 142  136 - 145 mEq/L   Potassium 3.8  3.5 - 5.1 mEq/L   Chloride 106  98 - 109 mEq/L   CO2 29  22 - 29 mEq/L   Glucose 103  70 - 140 mg/dl   BUN 14.7  7.0 - 26.0 mg/dL   Creatinine 0.9  0.6 - 1.1 mg/dL   Total Bilirubin 0.24  0.20 - 1.20 mg/dL   Alkaline Phosphatase 63  40 - 150 U/L   AST 23  5 - 34 U/L   ALT 15  0 - 55 U/L   Total Protein 7.1  6.4 - 8.3 g/dL   Albumin 3.8  3.5 - 5.0 g/dL   Calcium 9.5  8.4 - 10.4 mg/dL   Anion Gap 8  3 - 11 mEq/L     Studies/Results:  No results found. Mammograms due early Dec.  Medications: I have reviewed the patient's current medications.  DISCUSSION: patient has recovered rapidly from all of recent treatment, and looks great today. I am glad to see her back at any time if she or other MDs request, however with close follow up from gyn oncology and radiation oncology as well as PCP, I have not scheduled routine return visit to this office.  Assessment/Plan:  1.IA high grade serous endometrial carcinoma: post TAH/BSO/ pelvic and para-aortic node evaluation at [facility] 10-03-13 and taxol carbo x 6 cycles from 11-06-13 thru 02-24-14 . HDR completed 01-06-14.  She will see Dr Sondra Come in Nov and Dr Josephina Shih in February. I will see her prn now.  2.family history of breast and ovarian cancer, Ashkenazi Jewish descent. Genetics testing sent 12-10-13 returned normal for 24 gene OvaNext panel.  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 Anemia related to chemotherapy: improving, up to 11.4 today.   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 psoriases 6.to have flu vaccine tomorrow   Patient has had all questions answered to her satisfaction and is in agreement with plan.   Rayleen Wyrick P, MD   04/20/2014, 5:20 PM

## 2014-04-20 NOTE — Telephone Encounter (Signed)
per pof to sch pt appt-gave pt copy of sch °

## 2014-04-21 ENCOUNTER — Ambulatory Visit (INDEPENDENT_AMBULATORY_CARE_PROVIDER_SITE_OTHER): Payer: Medicare Other

## 2014-04-21 ENCOUNTER — Telehealth: Payer: Self-pay | Admitting: Family Medicine

## 2014-04-21 DIAGNOSIS — Z23 Encounter for immunization: Secondary | ICD-10-CM | POA: Diagnosis not present

## 2014-04-21 NOTE — Telephone Encounter (Signed)
Pt came in today to get flu shot and stated she was having problems getting her rx for folbic .  She stated her insurance is deny this because they say it is over the counter  Is there something else she can get a rx for or something else over the counter.  folbic is now $48 for 30 day supply

## 2014-04-22 NOTE — Telephone Encounter (Signed)
It is vitamin B6 and K81 and folic acid in one pill Perhaps she could take a generic otc B complex vitamin with a folic acid supplement together daily ? - she could check the price on that combo -could use store brands

## 2014-04-22 NOTE — Telephone Encounter (Signed)
Called pt and phone was busy

## 2014-04-24 NOTE — Telephone Encounter (Signed)
Patient notified as instructed by telephone. Patient stated that she will shop around.

## 2014-05-28 ENCOUNTER — Other Ambulatory Visit: Payer: Self-pay

## 2014-05-28 DIAGNOSIS — Z1231 Encounter for screening mammogram for malignant neoplasm of breast: Secondary | ICD-10-CM

## 2014-06-11 ENCOUNTER — Other Ambulatory Visit (HOSPITAL_BASED_OUTPATIENT_CLINIC_OR_DEPARTMENT_OTHER): Payer: Medicare Other

## 2014-06-11 ENCOUNTER — Other Ambulatory Visit (HOSPITAL_COMMUNITY)
Admission: RE | Admit: 2014-06-11 | Discharge: 2014-06-11 | Disposition: A | Discharge status: Home / Self Care | Payer: Medicare Other | Source: Ambulatory Visit | Attending: Radiation Oncology | Admitting: Radiation Oncology

## 2014-06-11 ENCOUNTER — Ambulatory Visit
Admission: RE | Admit: 2014-06-11 | Discharge: 2014-06-11 | Disposition: A | Discharge status: Home / Self Care | Payer: Medicare Other | Source: Ambulatory Visit | Attending: Radiation Oncology | Admitting: Radiation Oncology

## 2014-06-11 ENCOUNTER — Encounter: Payer: Self-pay | Admitting: Radiation Oncology

## 2014-06-11 DIAGNOSIS — C549 Malignant neoplasm of corpus uteri, unspecified: Secondary | ICD-10-CM | POA: Diagnosis not present

## 2014-06-11 DIAGNOSIS — C541 Malignant neoplasm of endometrium: Secondary | ICD-10-CM | POA: Diagnosis not present

## 2014-06-11 DIAGNOSIS — Z124 Encounter for screening for malignant neoplasm of cervix: Secondary | ICD-10-CM | POA: Insufficient documentation

## 2014-06-11 LAB — CBC WITH DIFFERENTIAL/PLATELET
BASO%: 0.3 % (ref 0.0–2.0)
Basophils Absolute: 0 10*3/uL (ref 0.0–0.1)
EOS%: 3.6 % (ref 0.0–7.0)
Eosinophils Absolute: 0.1 10*3/uL (ref 0.0–0.5)
HCT: 34.8 % (ref 34.8–46.6)
HGB: 11.5 g/dL — ABNORMAL LOW (ref 11.6–15.9)
LYMPH%: 30.5 % (ref 14.0–49.7)
MCH: 32.5 pg (ref 25.1–34.0)
MCHC: 33 g/dL (ref 31.5–36.0)
MCV: 98.3 fL (ref 79.5–101.0)
MONO#: 0.4 10*3/uL (ref 0.1–0.9)
MONO%: 11.1 % (ref 0.0–14.0)
NEUT#: 2.1 10*3/uL (ref 1.5–6.5)
NEUT%: 54.5 % (ref 38.4–76.8)
Platelets: 165 10*3/uL (ref 145–400)
RBC: 3.54 10*6/uL — ABNORMAL LOW (ref 3.70–5.45)
RDW: 12.6 % (ref 11.2–14.5)
WBC: 3.9 10*3/uL (ref 3.9–10.3)
lymph#: 1.2 10*3/uL (ref 0.9–3.3)

## 2014-06-11 LAB — COMPREHENSIVE METABOLIC PANEL (CC13)
ALT: 15 U/L (ref 0–55)
AST: 20 U/L (ref 5–34)
Albumin: 3.8 g/dL (ref 3.5–5.0)
Alkaline Phosphatase: 69 U/L (ref 40–150)
Anion Gap: 5 mEq/L (ref 3–11)
BUN: 15.6 mg/dL (ref 7.0–26.0)
CO2: 30 mEq/L — ABNORMAL HIGH (ref 22–29)
Calcium: 9.5 mg/dL (ref 8.4–10.4)
Chloride: 105 mEq/L (ref 98–109)
Creatinine: 0.7 mg/dL (ref 0.6–1.1)
Glucose: 100 mg/dl (ref 70–140)
Potassium: 4.4 mEq/L (ref 3.5–5.1)
Sodium: 140 mEq/L (ref 136–145)
Total Bilirubin: 0.25 mg/dL (ref 0.20–1.20)
Total Protein: 6.7 g/dL (ref 6.4–8.3)

## 2014-06-11 NOTE — Progress Notes (Signed)
Veronica Ward here for follow up after treatment for endometrial cancer.  She denies pain, bowel/bladder issues, vaginal/rectal bleeding and nausea.  She reports her energy level is improving and she has a good appetite. She reports hitting the left side of her forehead 2 nights ago when getting up during the night.  She does have a bruise on her left forehead.  She denies having dizziness.

## 2014-06-11 NOTE — Progress Notes (Signed)
Radiation Oncology         [phone] ________________________________  Name: Veronica Ward MRN: [id number]  Date: 06/11/2014  DOB: 1945-08-28  Follow-Up Visit Note  CC: [name], MD  Marti Sleigh *    ICD-9-CM ICD-10-CM   1. Endometrial cancer 182.0 C54.1 Cytology - PAP    Diagnosis:  Endometrial  cancer, serous, stage IA  Interval Since Last Radiation:  5  months  Narrative:  The patient returns today for routine follow-up.  She is doing well and without complaints. She continues to use her vaginal dilator on a regular basis.she is sexually active with her husband. She denies any postcoital bleeding or dyspareunia. Patient feels her current dilator is too small and we've given her a larger size to use.  She denies any pelvic pain hematuria or rectal bleeding.                              ALLERGIES:  is allergic to bactrim; oxycodone-acetaminophen; and sulfa antibiotics.  Meds: Current Outpatient Prescriptions  Medication Sig Dispense Refill  . CALCIUM PO Take 1,000 mg by mouth daily.    . cholecalciferol (VITAMIN D) 400 UNITS TABS Take by mouth.    . co-enzyme Q-10 30 MG capsule Take 30 mg by mouth 3 (three) times daily.    . fish oil-omega-3 fatty acids 1000 MG capsule Take by mouth 2 (two) times daily.     . FOLBIC 2.5-25-2 MG TABS TAKE 1 TABLET BY MOUTH DAILY 30 each 2  . ibuprofen (ADVIL,MOTRIN) 200 MG tablet Take 200 mg by mouth as needed for pain.    . magnesium 30 MG tablet Take 30 mg by mouth daily.    . Multiple Vitamin (MULTIVITAMIN) tablet Take 1 tablet by mouth daily.    . polyethylene glycol (MIRALAX / GLYCOLAX) packet Take 17 g by mouth daily as needed.    . Ascorbic Acid (VITAMIN C) 100 MG tablet Take 100 mg by mouth daily.    Marland Kitchen HYDROcodone-acetaminophen (VICODIN) 5-500 MG per tablet Take 1 tablet by mouth every 6 (six) hours as needed for pain.    . hydrocortisone-pramoxine (PROCTOFOAM-HC) rectal foam Place 1 applicator rectally 2 (two) times  daily as needed for hemorrhoids. 20 g 1  . loratadine (CLARITIN) 10 MG tablet Take 10 mg by mouth daily as needed for allergies.    Marland Kitchen LORazepam (ATIVAN) 1 MG tablet 1/2 to 1 tablet  Under tongue or by mouth every 6 hours as needed for nausea. 20 tablet 0  . ondansetron (ZOFRAN) 8 MG tablet 1-2 tablets every 12 hours as needed for nausea. Will not make drowsy. Also take 1 tablet AM after chemo whether or not any nausea 30 tablet 1  . senna-docusate (SENOKOT S) 8.6-50 MG per tablet Take 2 tablets by mouth at bedtime.      No current facility-administered medications for this encounter.    Physical Findings: The patient is in no acute distress. Patient is alert and oriented.  height is 5\' 3"  (1.6 m) and weight is 107 lb 4.8 oz (48.671 kg). Her oral temperature is 98.6 F (37 C). Her blood pressure is 108/60 and her pulse is 77. Her respiration is 16. Marland Kitchen  No palpable subclavicular or axillary adenopathy. The lungs are clear to auscultation. The heart has regular rhythm and rate. The abdomen is soft nontender with normal bowel sounds. No inguinal adenopathy appreciated. On pelvic examination the external genitalia are  unremarkable. A speculum exam is performed. There are no mucosal lesions noted in the vaginal vault. A Pap smear was obtained of the proximal vagina. On bimanual and rectovaginal examination there no pelvic masses appreciated.  Lab Findings: Lab Results  Component Value Date   WBC 3.9 06/11/2014   HGB 11.5* 06/11/2014   HCT 34.8 06/11/2014   MCV 98.3 06/11/2014   PLT 165 06/11/2014    Radiographic Findings: No results found.  Impression:  No evidence recurrence on clinical exam today, Pap smear pending  Plan:  Routine follow-up in 6 months. In the interim the patient to be seen by gynecologic oncology.  ____________________________________ Blair Promise, MD

## 2014-06-12 ENCOUNTER — Telehealth: Payer: Self-pay

## 2014-06-12 ENCOUNTER — Other Ambulatory Visit: Payer: Self-pay | Admitting: Family Medicine

## 2014-06-12 LAB — CYTOLOGY - PAP

## 2014-06-15 NOTE — Telephone Encounter (Signed)
Told [name] that her labs from 06-11-14 all looked good per Dr. Marko Plume.  Her Hgb was 11.5 which is essentially normal. [name] verbalized understanding and stated that she is feeling great.

## 2014-06-22 ENCOUNTER — Telehealth: Payer: Self-pay | Admitting: Oncology

## 2014-06-22 NOTE — Telephone Encounter (Signed)
Notified Veronica Ward of the good results on her pap smear per Dr. Sondra Come.  She verbalized understanding and did not have any questions.

## 2014-06-25 ENCOUNTER — Other Ambulatory Visit: Payer: Self-pay

## 2014-06-25 ENCOUNTER — Ambulatory Visit
Admission: RE | Admit: 2014-06-25 | Discharge: 2014-06-25 | Disposition: A | Discharge status: Home / Self Care | Payer: Medicare Other | Source: Ambulatory Visit

## 2014-06-25 DIAGNOSIS — Z1231 Encounter for screening mammogram for malignant neoplasm of breast: Secondary | ICD-10-CM

## 2014-07-13 ENCOUNTER — Other Ambulatory Visit: Payer: Self-pay | Admitting: Family Medicine

## 2014-07-13 NOTE — Telephone Encounter (Signed)
I do not think she is taking it right now while undergoing cancer treatment (you can check in with the pt to be sure) Thanks

## 2014-07-13 NOTE — Telephone Encounter (Signed)
Electronic refill request, no on med list and it says it was d/c in 10/2013 on med hx, please advise

## 2014-07-14 NOTE — Telephone Encounter (Signed)
Pt said her oncologist told her since she is doing so well with her treatments that it was okay for her to restart taking Rx, pt request Rx refilled, please advise

## 2014-07-14 NOTE — Telephone Encounter (Signed)
That sounds great  I will send it electronically

## 2014-07-15 ENCOUNTER — Encounter (HOSPITAL_COMMUNITY): Payer: Self-pay

## 2014-07-15 ENCOUNTER — Ambulatory Visit (INDEPENDENT_AMBULATORY_CARE_PROVIDER_SITE_OTHER): Payer: Medicare Other | Admitting: Family Medicine

## 2014-07-15 ENCOUNTER — Encounter: Payer: Self-pay | Admitting: Family Medicine

## 2014-07-15 ENCOUNTER — Telehealth: Payer: Self-pay

## 2014-07-15 ENCOUNTER — Emergency Department (HOSPITAL_COMMUNITY)
Admission: EM | Admit: 2014-07-15 | Discharge: 2014-07-15 | Disposition: A | Discharge status: Home / Self Care | Payer: Medicare Other | Attending: Orthopedic Surgery | Admitting: Orthopedic Surgery

## 2014-07-15 VITALS — BP 120/76 | HR 64 | Temp 98.5°F | Ht 63.0 in | Wt 104.0 lb

## 2014-07-15 DIAGNOSIS — Z8739 Personal history of other diseases of the musculoskeletal system and connective tissue: Secondary | ICD-10-CM | POA: Diagnosis not present

## 2014-07-15 DIAGNOSIS — Z8542 Personal history of malignant neoplasm of other parts of uterus: Secondary | ICD-10-CM | POA: Diagnosis not present

## 2014-07-15 DIAGNOSIS — L03012 Cellulitis of left finger: Secondary | ICD-10-CM | POA: Diagnosis not present

## 2014-07-15 DIAGNOSIS — K649 Unspecified hemorrhoids: Secondary | ICD-10-CM | POA: Diagnosis not present

## 2014-07-15 DIAGNOSIS — Z87891 Personal history of nicotine dependence: Secondary | ICD-10-CM | POA: Insufficient documentation

## 2014-07-15 DIAGNOSIS — L02512 Cutaneous abscess of left hand: Secondary | ICD-10-CM

## 2014-07-15 DIAGNOSIS — Z85828 Personal history of other malignant neoplasm of skin: Secondary | ICD-10-CM | POA: Diagnosis not present

## 2014-07-15 DIAGNOSIS — L089 Local infection of the skin and subcutaneous tissue, unspecified: Secondary | ICD-10-CM

## 2014-07-15 DIAGNOSIS — Z8659 Personal history of other mental and behavioral disorders: Secondary | ICD-10-CM | POA: Insufficient documentation

## 2014-07-15 DIAGNOSIS — Z8619 Personal history of other infectious and parasitic diseases: Secondary | ICD-10-CM | POA: Diagnosis not present

## 2014-07-15 DIAGNOSIS — Z8669 Personal history of other diseases of the nervous system and sense organs: Secondary | ICD-10-CM | POA: Insufficient documentation

## 2014-07-15 DIAGNOSIS — Z862 Personal history of diseases of the blood and blood-forming organs and certain disorders involving the immune mechanism: Secondary | ICD-10-CM | POA: Diagnosis not present

## 2014-07-15 DIAGNOSIS — Z79899 Other long term (current) drug therapy: Secondary | ICD-10-CM | POA: Insufficient documentation

## 2014-07-15 DIAGNOSIS — M7989 Other specified soft tissue disorders: Secondary | ICD-10-CM | POA: Diagnosis present

## 2014-07-15 DIAGNOSIS — Z9889 Other specified postprocedural states: Secondary | ICD-10-CM | POA: Diagnosis not present

## 2014-07-15 DIAGNOSIS — M79645 Pain in left finger(s): Secondary | ICD-10-CM | POA: Diagnosis not present

## 2014-07-15 MED ORDER — HYDROCODONE-ACETAMINOPHEN 5-300 MG PO TABS: 1.0000 | ORAL_TABLET | Freq: Four times a day (QID) | ORAL | Status: DC | PRN

## 2014-07-15 MED ORDER — DOXYCYCLINE HYCLATE 100 MG PO TABS
100.0000 mg | ORAL_TABLET | Freq: Once | ORAL | Status: AC
  Administered 2014-07-15: 100 mg via ORAL
  Filled 2014-07-15: qty 1

## 2014-07-15 MED ORDER — HYDROCODONE-ACETAMINOPHEN 5-325 MG PO TABS
1.0000 | ORAL_TABLET | Freq: Once | ORAL | Status: AC
  Administered 2014-07-15: 1 via ORAL
  Filled 2014-07-15: qty 1

## 2014-07-15 MED ORDER — DOXYCYCLINE HYCLATE 100 MG PO TABS: 100.0000 mg | ORAL_TABLET | Freq: Two times a day (BID) | ORAL | Status: DC

## 2014-07-15 MED ORDER — LIDOCAINE HCL (PF) 1 % IJ SOLN
30.0000 mL | Freq: Once | INTRAMUSCULAR | Status: AC
  Administered 2014-07-15: 30 mL via INTRADERMAL
  Filled 2014-07-15: qty 30

## 2014-07-15 NOTE — Consult Note (Signed)
Reason for Consult:left thumb infection Referring Physician: Dr.Barnes/emergency department  Veronica Ward is an 68 y.o. female.  HPI: Pt sent to ed from office with left thumb infection Pt had manicure within the past week and has increased pain and swelling No systemic signs of infection  Past Medical History  Diagnosis Date  . Hx of basal cell carcinoma     face  . Diffuse cystic mastopathy   . Headache(784.0)   . Unspecified hemorrhoids without mention of complication   . Other malaise and fatigue   . Disorder of bone and cartilage, unspecified   . Other psoriasis   . Other seborrheic keratosis   . Sleep disturbance, unspecified   . Predominant disturbance of emotions   . Pain in limb   . Urticaria, unspecified   . History of shingles   . Anxiety   . Anemia   . Dysautonomia     mild  . Osteoporosis   . Endometrial cancer   . Ashkenazi Jewish ancestry   . Radiation 01/06/14, 12/23/13, 12/16/13, 12/09/13    HDR brachytherapy    Past Surgical History  Procedure Laterality Date  . Cervical conization w/bx    . Cervical cancer  1973  . Breast biopsy Left 2001    benign  . Tubal ligation    . Dexa  03/2002    oseopenia  . Colonoscopy  12/04    int. hemorrhoids  . Dexa  07/2004    decreased BMD, osteopenia  . Dexa  02/2007    Osteopenia  . Breast lumpectomy      left underarm  . Birthmark removal  childhood  . Dexa  9/10    Osteopenia slightly worse  . Felon i&d  12/2010    Dr. Fredna Dow, I&D in OR (L index finger)  . Finger surgery    . Knee surgery      right  . Robotic assisted total hysterectomy with bilateral salpingo oopherectomy  10/03/2013    Robotic-assisted total laparoscopic hysterectomy, bilateral salpingo-oophorectomy, bilateral pelvic and para-aortic lymphadenectomy with sentinel lymph node dissection  . Tonsilectomy, adenoidectomy, bilateral myringotomy and tubes    . Axillary lymph node dissection      left    Family History  Problem Relation Age of  Onset  . Multiple myeloma Father   . Coronary artery disease Father   . Transient ischemic attack Mother   . Lupus Maternal Aunt   . Breast cancer Maternal Aunt     dx 54s; deceased  . Cancer Maternal Grandfather     GI cancer or pancreatic; deceased 12  . Colon cancer Neg Hx   . Ovarian cancer Maternal Aunt     dx 74s; deceased 58s  . Multiple sclerosis Brother     Social History:  reports that she quit smoking about 51 years ago. She has never used smokeless tobacco. She reports that she drinks alcohol. She reports that she does not use illicit drugs.  Allergies:  Allergies  Allergen Reactions  . Bactrim [Sulfamethoxazole-Trimethoprim] Rash  . Oxycodone-Acetaminophen Anxiety  . Sulfa Antibiotics Rash    Medications: I have reviewed the patient's current medications.  No results found for this or any previous visit (from the past 48 hour(s)).  No results found.  ROS no recent illnesses or hospitalizations Blood pressure 137/70, pulse 75, temperature 98.4 F (36.9 C), temperature source Oral, resp. rate 18, height '5\' 3"'  (1.6 m), weight 47.174 kg (104 lb), last menstrual period 09/17/2000, SpO2 100 %. Physical Exam  General Appearance:  Alert, cooperative, no distress, appears stated age  Head:  Normocephalic, without obvious abnormality, atraumatic  Eyes:  Pupils equal, conjunctiva/corneas clear,         Throat: Lips, mucosa, and tongue normal; teeth and gums normal  Neck: No visible masses     Lungs:   respirations unlabored  Chest Wall:  No tenderness or deformity  Heart:  Regular rate and rhythm,  Abdomen:   Soft, non-tender,         Extremities: Left thumb: soft tissue swelling distally over pulp with distal abscess collection marked ttp over thumb tip Thumb warm well perfused Good cap refil No wounds to index/long/ring/small  Pulses: 2+ and symmetric  Skin: Skin color, texture, turgor normal, no rashes or lesions     Neurologic: Normal    Assessment/Plan: Left thumb felon  At bedside, verbal consent obtained.  Finger flexor sheath block performed with 1% lidocaine Pt tolerated. Thumb prepped and draped in sterile fashion Pt tolerated Small fish mouth incision made and pus drained. Pt tolerated this well septae divided and small amount of packing gauze used to keep thumb wound open Sterile bandage applied  Plan: Home on oral abx, Doxycycline Wound care instructions given F/u in office on Monday Pt voiced understanding of wound care over holiday weekend vicodin for pain  Linna Hoff 07/15/2014, 8:43 PM

## 2014-07-15 NOTE — Telephone Encounter (Signed)
Pt was seen

## 2014-07-15 NOTE — Progress Notes (Signed)
Subjective:    Patient ID: Veronica Ward, female    DOB: June 16, 1946, 68 y.o.   MRN: [id number]  HPI Here with a thumb ? Infection   Does not know how she got it  She did have a manicure (never had one before) - went to a local place the Friday before thanksgiving Then had it done again last Friday  Before she went the second time she felt a bit of throbbing in thumb  No skin interruption  Took 2 advil and it went away   Wondered if it was neuropathy due to chemo   Happened again -no swelling or redness or skin interruption  Took advil again and it went away   Yesterday afternoon - throbbing and then by night - swelling began in the tip of her thumb  advil did not help  She put thumb in peroxide Then soak in hot soapy water  Got up this am - very uncomfortable - cannot use the thumb well due to pain  She did elevate it and used cold compress which helped a bit   No hx of gout   Patient Active Problem List   Diagnosis Date Noted  . Ashkenazi Jewish ancestry   . Endometrial cancer 11/03/2013  . CIS (carcinoma in situ of cervix) 09/23/2013  . Post-menopausal bleeding 09/08/2013  . Left wrist pain 08/07/2013  . Encounter for Medicare annual wellness exam 03/12/2013  . Colon cancer screening 03/12/2013  . Routine general medical examination at a health care facility 03/03/2013  . Foot pain 02/02/2012  . Left foot pain 01/17/2012  . Right shoulder pain 01/02/2012  . Right arm pain 01/02/2012  . Low back pain 11/10/2011  . Other screening mammogram 04/05/2011  . Screening for lipoid disorders 01/15/2011  . Encounter for medication monitoring 11/23/2010  . Weight loss 11/23/2010  . MALAISE AND FATIGUE 02/17/2010  . HEADACHE 02/17/2010  . TOE PAIN 12/29/2009  . SEBORRHEIC KERATOSIS 08/25/2009  . STRESS REACTION, ACUTE, WITH EMOTIONAL DISTURBANCE 12/09/2008  . SLEEP DISORDER 12/09/2008  . HEMORRHOIDS 10/12/2008  . Osteoporosis 10/08/2007  . BASAL CELL CARCINOMA, FACE  06/21/2007  . FIBROCYSTIC BREAST DISEASE 06/21/2007  . PSORIASIS 06/21/2007  . URTICARIA 06/21/2007  . BASAL CELL CARCINOMA, FACE 06/21/2007   Past Medical History  Diagnosis Date  . Hx of basal cell carcinoma     face  . Diffuse cystic mastopathy   . Headache(784.0)   . Unspecified hemorrhoids without mention of complication   . Other malaise and fatigue   . Disorder of bone and cartilage, unspecified   . Other psoriasis   . Other seborrheic keratosis   . Sleep disturbance, unspecified   . Predominant disturbance of emotions   . Pain in limb   . Urticaria, unspecified   . History of shingles   . Anxiety   . Anemia   . Dysautonomia     mild  . Osteoporosis   . Endometrial cancer   . Ashkenazi Jewish ancestry   . Radiation 01/06/14, 12/23/13, 12/16/13, 12/09/13    HDR brachytherapy   Past Surgical History  Procedure Laterality Date  . Cervical conization w/bx    . Cervical cancer  1973  . Breast biopsy Left 2001    benign  . Tubal ligation    . Dexa  03/2002    oseopenia  . Colonoscopy  12/04    int. hemorrhoids  . Dexa  07/2004    decreased BMD, osteopenia  . Dexa  02/2007  Osteopenia  . Breast lumpectomy      left underarm  . Birthmark removal  childhood  . Dexa  9/10    Osteopenia slightly worse  . Felon i&d  12/2010    Dr. Fredna Dow, I&D in OR (L index finger)  . Finger surgery    . Knee surgery      right  . Robotic assisted total hysterectomy with bilateral salpingo oopherectomy  10/03/2013    Robotic-assisted total laparoscopic hysterectomy, bilateral salpingo-oophorectomy, bilateral pelvic and para-aortic lymphadenectomy with sentinel lymph node dissection  . Tonsilectomy, adenoidectomy, bilateral myringotomy and tubes    . Axillary lymph node dissection      left   History  Substance Use Topics  . Smoking status: Former Smoker    Quit date: 07/25/1963  . Smokeless tobacco: Never Used  . Alcohol Use: 0.0 oz/week    0 drink(s) per week     Comment: 1/2  glass of wine a day   Family History  Problem Relation Age of Onset  . Multiple myeloma Father   . Coronary artery disease Father   . Transient ischemic attack Mother   . Lupus Maternal Aunt   . Breast cancer Maternal Aunt     dx 66s; deceased  . Cancer Maternal Grandfather     GI cancer or pancreatic; deceased 42  . Colon cancer Neg Hx   . Ovarian cancer Maternal Aunt     dx 43s; deceased 94s  . Multiple sclerosis Brother    Allergies  Allergen Reactions  . Bactrim [Sulfamethoxazole-Trimethoprim] Rash  . Oxycodone-Acetaminophen Anxiety  . Sulfa Antibiotics Rash   Current Outpatient Prescriptions on File Prior to Visit  Medication Sig Dispense Refill  . alendronate (FOSAMAX) 70 MG tablet TAKE 1 TABLET BY MOUTH 30 MINUTES BEFOREBREAKFAST ONCE A WEEK WITH ATLEAST 8 OZ. OF WATER. 4 tablet 11  . Ascorbic Acid (VITAMIN C) 100 MG tablet Take 100 mg by mouth daily.    Marland Kitchen CALCIUM PO Take 1,000 mg by mouth daily.    . cholecalciferol (VITAMIN D) 400 UNITS TABS Take by mouth.    . co-enzyme Q-10 30 MG capsule Take 30 mg by mouth 3 (three) times daily.    . fish oil-omega-3 fatty acids 1000 MG capsule Take by mouth 2 (two) times daily.     . FOLBIC 2.5-25-2 MG TABS TAKE 1 TABLET BY MOUTH DAILY 30 each 3  . ibuprofen (ADVIL,MOTRIN) 200 MG tablet Take 200 mg by mouth as needed for pain.    . magnesium 30 MG tablet Take 30 mg by mouth daily.    . Multiple Vitamin (MULTIVITAMIN) tablet Take 1 tablet by mouth daily.    . hydrocortisone-pramoxine (PROCTOFOAM-HC) rectal foam Place 1 applicator rectally 2 (two) times daily as needed for hemorrhoids. (Patient not taking: Reported on 07/15/2014) 20 g 1  . loratadine (CLARITIN) 10 MG tablet Take 10 mg by mouth daily as needed for allergies.     No current facility-administered medications on file prior to visit.      Review of Systems Review of Systems  Constitutional: Negative for fever, appetite change, fatigue and unexpected weight change.    Eyes: Negative for pain and visual disturbance.  Respiratory: Negative for cough and shortness of breath.   Cardiovascular: Negative for cp or palpitations    Gastrointestinal: Negative for nausea, diarrhea and constipation.  Genitourinary: Negative for urgency and frequency.  Skin: Negative for pallor or rash   MSK pos for L thumb pain and swelling  Neurological: Negative for weakness, light-headedness, numbness and headaches.  Hematological: Negative for adenopathy. Does not bruise/bleed easily.  Psychiatric/Behavioral: Negative for dysphoric mood. The patient is not nervous/anxious.         Objective:   Physical Exam  Constitutional: She appears well-developed and well-nourished. No distress.  Slim and well app  Eyes: Conjunctivae and EOM are normal. Pupils are equal, round, and reactive to light.  Neck: Normal range of motion. Neck supple.  Musculoskeletal: She exhibits edema and tenderness.  Erythema of L thumb medially around cuticle without obvious abscess or fluctuance  Ragged cuticle/ no skin breakdown  Swelling over tip of thumb with tenderness Nl perf and sens Pt unable to fully flex thumb due to pain     Lymphadenopathy:    She has no cervical adenopathy.  Neurological: She is alert. A sensory deficit is present.  Skin: Skin is warm and dry. No rash noted. There is erythema.  Psychiatric: She has a normal mood and affect.          Assessment & Plan:   Problem List Items Addressed This Visit      Other   Infection of thumb - Primary    Erythema and pain of tip of thumb - and medial cuticle with tenderness and throbbing pain  Limited flexion due to pain  No fluctuance noted or drainage  Of note -she has hx of felon in the past that had to be I and D in the OR by Dr Fredna Dow Urgent ref to hand specialist     Relevant Orders      Ambulatory referral to Orthopedic Surgery

## 2014-07-15 NOTE — ED Notes (Signed)
Pt sent her for further evaluation of left thumb.  Pt originally went to see her PCP, was referred to [facility] and was told by Dr. Caralyn Guile to come to ED.

## 2014-07-15 NOTE — Telephone Encounter (Signed)
PLEASE NOTE: All timestamps contained within this report are represented as [place] Standard Time. CONFIDENTIALTY NOTICE: This fax transmission is intended only for the addressee. It contains information that is legally privileged, confidential or otherwise protected from use or disclosure. If you are not the intended recipient, you are strictly prohibited from reviewing, disclosing, copying using or disseminating any of this information or taking any action in reliance on or regarding this information. If you have received this fax in error, please notify us immediately by telephone so that we can arrange for its return to [place]. Phone: [phone], Toll-Free: [phone], Fax: [phone] Page: 1 of 2 Call Id: [id number] [place] Patient Name: Veronica Ward Gender: Female DOB: June 16, 1946 Age: 68 Y 37 M 12 D Return Phone Number: [phone] (Primary), [phone] (Secondary) Address: [address] City/State/Zip: [place] [address] Client [facility] - Client Client Site [place], [place] Contact Type Call Call Type Triage / Clinical Relationship To Patient Self Return Phone Number [phone] (Primary) Chief Complaint Finger Pain Initial Comment Caller states have finger infection; began week ago; no puncture wound but had pain that comes and goes; had chemo that ended in August; yesterday began throbbing badly; soaked in hydrogen peroxide, hot water; cannot bend her thumb; hostory of same, but previously had puncture wound; has 12:30 appt w/[facility]; PreDisposition Call Doctor Nurse Assessment Nurse: Julien Girt, RN, Almyra Free Date/Time Eilene Ghazi Time): 07/15/2014 8:34:28 AM Confirm and document reason for call. If symptomatic, describe symptoms. ---Caller states she began having pain in her left thumb for the last week.  There is no puncture, but the pain is constant, the thumb is swollen this morning and she is concerned it is infected. She is unable to bend the thumb due to the swelling. She has an appt at 12:30 at the [facility]. Has the patient traveled out of the country within the last 30 days? ---Not Applicable Does the patient require triage? ---Yes Related visit to physician within the last 2 weeks? ---No Does the PT have any chronic conditions? (i.e. diabetes, asthma, etc.) ---Yes List chronic conditions. ---Endometrial CA/ finished chemo in August, osteoporosis Guidelines Guideline Title Affirmed Question Affirmed Notes Nurse Date/Time (Eastern Time) Finger Pain [1] SEVERE pain (e.g., excruciating) AND [2] not improved after 2 hours of pain medicine Chancy Hurter 07/15/2014 8:41:18 AM Disp. Time Eilene Ghazi Time) Disposition Final User 07/15/2014 8:46:16 AM See Physician within 4 Hours (or PCP triage) Yes Julien Girt, RN, Sheilah Mins NOTE: All timestamps contained within this report are represented as [place] Standard Time. CONFIDENTIALTY NOTICE: This fax transmission is intended only for the addressee. It contains information that is legally privileged, confidential or otherwise protected from use or disclosure. If you are not the intended recipient, you are strictly prohibited from reviewing, disclosing, copying using or disseminating any of this information or taking any action in reliance on or regarding this information. If you have received this fax in error, please notify us immediately by telephone so that we can arrange for its return to [place]. Phone: [phone], Toll-Free: [phone], Fax: [phone] Page: 2 of 2 Call Id: [id number] Caller Understands: Yes Disagree/Comply: Comply Care Advice Given Per Guideline SEE PHYSICIAN WITHIN 4 HOURS (or PCP triage): IBUPROFEN (E.G., MOTRIN, ADVIL): CALL BACK IF: * You become worse. CARE ADVICE given per Finger Pain (Adult) guideline. After  Care Instructions Given Call Event Type User Date /  Time Description Referrals REFERRED TO PCP OFFICE

## 2014-07-15 NOTE — ED Notes (Signed)
Pts vital signs updated and pt awaiting discharge paperwork at bedside.

## 2014-07-15 NOTE — ED Notes (Signed)
Informed Caralyn Guile that patient has arrived

## 2014-07-15 NOTE — Progress Notes (Signed)
Pre visit review using our clinic review tool, if applicable. No additional management support is needed unless otherwise documented below in the visit note. 

## 2014-07-15 NOTE — ED Notes (Signed)
Dr. Caralyn Guile at bedside.

## 2014-07-15 NOTE — Patient Instructions (Signed)
Stop at check out to let the staff know about referral  Continue to elevate thumb and keep clean with soap and water

## 2014-07-15 NOTE — Discharge Instructions (Signed)
KEEP BANDAGE CLEAN AND DRY CALL OFFICE FOR F/U APPT [phone] OK TO BEGIN DRESSING CHANGES ON Friday WOUND CARE INSTRUCTIONS GIVEN, SOAK TWICE A DAY AND DRESS WITH DRY GAUZE TWICE A DAY DR. Caralyn Guile CELL PHONE [phone] KEEP HAND ELEVATED ABOVE HEART OK TO APPLY ICE TO OPERATIVE AREA CONTACT OFFICE IF ANY WORSENING PAIN OR CONCERNS.

## 2014-07-15 NOTE — Assessment & Plan Note (Signed)
Erythema and pain of tip of thumb - and medial cuticle with tenderness and throbbing pain  Limited flexion due to pain  No fluctuance noted or drainage  Of note -she has hx of felon in the past that had to be I and D in the OR by Dr Fredna Dow Urgent ref to hand specialist

## 2014-07-15 NOTE — ED Provider Notes (Signed)
CSN: [id number]     Arrival date & time 07/15/14  1803 History  This chart was scribed for non-physician practitioner, Charlann Lange, PA-C, working with Richarda Blade, MD, by Delphia Grates, ED Scribe. This patient was seen in room TR06C/TR06C and the patient's care was started at 6:33 PM.     Chief Complaint  Patient presents with  . Finger Injury    The history is provided by the patient. No language interpreter was used.   HPI Comments: Veronica Ward is a 68 y.o. female who presents to the Emergency Department complaining of pain and swelling to left thumb without injury. Patient was seen by PCP, who discussed her condition with Dr. Caralyn Guile. She was then informed by Dr. Caralyn Guile to come to the ED for further evaluation by him.   Past Medical History  Diagnosis Date  . Hx of basal cell carcinoma     face  . Diffuse cystic mastopathy   . Headache(784.0)   . Unspecified hemorrhoids without mention of complication   . Other malaise and fatigue   . Disorder of bone and cartilage, unspecified   . Other psoriasis   . Other seborrheic keratosis   . Sleep disturbance, unspecified   . Predominant disturbance of emotions   . Pain in limb   . Urticaria, unspecified   . History of shingles   . Anxiety   . Anemia   . Dysautonomia     mild  . Osteoporosis   . Endometrial cancer   . Ashkenazi Jewish ancestry   . Radiation 01/06/14, 12/23/13, 12/16/13, 12/09/13    HDR brachytherapy   Past Surgical History  Procedure Laterality Date  . Cervical conization w/bx    . Cervical cancer  1973  . Breast biopsy Left 2001    benign  . Tubal ligation    . Dexa  03/2002    oseopenia  . Colonoscopy  12/04    int. hemorrhoids  . Dexa  07/2004    decreased BMD, osteopenia  . Dexa  02/2007    Osteopenia  . Breast lumpectomy      left underarm  . Birthmark removal  childhood  . Dexa  9/10    Osteopenia slightly worse  . Felon i&d  12/2010    Dr. Fredna Dow, I&D in OR (L index finger)  . Finger  surgery    . Knee surgery      right  . Robotic assisted total hysterectomy with bilateral salpingo oopherectomy  10/03/2013    Robotic-assisted total laparoscopic hysterectomy, bilateral salpingo-oophorectomy, bilateral pelvic and para-aortic lymphadenectomy with sentinel lymph node dissection  . Tonsilectomy, adenoidectomy, bilateral myringotomy and tubes    . Axillary lymph node dissection      left   Family History  Problem Relation Age of Onset  . Multiple myeloma Father   . Coronary artery disease Father   . Transient ischemic attack Mother   . Lupus Maternal Aunt   . Breast cancer Maternal Aunt     dx 33s; deceased  . Cancer Maternal Grandfather     GI cancer or pancreatic; deceased 73  . Colon cancer Neg Hx   . Ovarian cancer Maternal Aunt     dx 93s; deceased 16s  . Multiple sclerosis Brother    History  Substance Use Topics  . Smoking status: Former Smoker    Quit date: 07/25/1963  . Smokeless tobacco: Never Used  . Alcohol Use: 0.0 oz/week    0 drink(s) per week  Comment: 1/2 glass of wine a day   OB History    No data available     Review of Systems  Constitutional: Negative for fever.  Musculoskeletal:       See HPI.  Skin: Negative for color change and wound.      Allergies  Bactrim; Oxycodone-acetaminophen; and Sulfa antibiotics  Home Medications   Prior to Admission medications   Medication Sig Start Date End Date Taking? Authorizing Provider  alendronate (FOSAMAX) 70 MG tablet TAKE 1 TABLET BY MOUTH 30 MINUTES BEFOREBREAKFAST ONCE A WEEK WITH ATLEAST 8 OZ. OF WATER. 07/14/14   Abner Greenspan, MD  Ascorbic Acid (VITAMIN C) 100 MG tablet Take 100 mg by mouth daily.    Historical Provider, MD  CALCIUM PO Take 1,000 mg by mouth daily.    Historical Provider, MD  cholecalciferol (VITAMIN D) 400 UNITS TABS Take by mouth.    Historical Provider, MD  co-enzyme Q-10 30 MG capsule Take 30 mg by mouth 3 (three) times daily.    Historical Provider, MD   fish oil-omega-3 fatty acids 1000 MG capsule Take by mouth 2 (two) times daily.     Historical Provider, MD  FOLBIC 2.5-25-2 MG TABS TAKE 1 TABLET BY MOUTH DAILY 06/12/14   Abner Greenspan, MD  hydrocortisone-pramoxine [facility]) rectal foam Place 1 applicator rectally 2 (two) times daily as needed for hemorrhoids. Patient not taking: Reported on 07/15/2014 01/05/14   Gordy Levan, MD  ibuprofen (ADVIL,MOTRIN) 200 MG tablet Take 200 mg by mouth as needed for pain.    Historical Provider, MD  loratadine (CLARITIN) 10 MG tablet Take 10 mg by mouth daily as needed for allergies.    Historical Provider, MD  magnesium 30 MG tablet Take 30 mg by mouth daily.    Historical Provider, MD  Multiple Vitamin (MULTIVITAMIN) tablet Take 1 tablet by mouth daily.    Historical Provider, MD   Triage Vitals: BP 137/70 mmHg  Pulse 75  Temp(Src) 98.4 F (36.9 C) (Oral)  Resp 18  Ht '5\' 3"'  (1.6 m)  Wt 104 lb (47.174 kg)  BMI 18.43 kg/m2  SpO2 100%  LMP 09/17/2000  Physical Exam  Constitutional: She is oriented to person, place, and time. She appears well-developed and well-nourished. No distress.  HENT:  Head: Normocephalic and atraumatic.  Eyes: Conjunctivae and EOM are normal.  Neck: Neck supple. No tracheal deviation present.  Cardiovascular: Normal rate.   Pulmonary/Chest: Effort normal. No respiratory distress.  Musculoskeletal: Normal range of motion.  Left thumb swollen distally. Extremely tender to palmar aspect with some induration. No cuticle abnormality.  Neurological: She is alert and oriented to person, place, and time.  Skin: Skin is warm and dry.  Psychiatric: She has a normal mood and affect. Her behavior is normal.  Nursing note and vitals reviewed.   ED Course  Procedures (including critical care time)  DIAGNOSTIC STUDIES: Oxygen Saturation is 100% on room air, normal by my interpretation.    COORDINATION OF CARE:   Labs Review Labs Reviewed - No data to  display  Imaging Review No results found.   EKG Interpretation None      MDM   Final diagnoses:  None    1. Felon  Discussed with Dr. Caralyn Guile who will see patient in the ED.  I personally performed the services described in this documentation, which was scribed in my presence. The recorded information has been reviewed and is accurate.     Dewaine Oats, PA-C  07/15/14 [place], MD 07/16/14 1125

## 2014-07-15 NOTE — Telephone Encounter (Signed)
Pt has appt 07/15/14 at 12:30 with Dr Glori Bickers.

## 2014-09-04 ENCOUNTER — Encounter: Payer: Self-pay | Admitting: Gynecology

## 2014-09-04 ENCOUNTER — Other Ambulatory Visit (HOSPITAL_COMMUNITY)
Admission: RE | Admit: 2014-09-04 | Discharge: 2014-09-04 | Disposition: A | Discharge status: Home / Self Care | Payer: Medicare Other | Source: Ambulatory Visit | Attending: Gynecology | Admitting: Gynecology

## 2014-09-04 ENCOUNTER — Ambulatory Visit: Payer: Medicare Other | Attending: Gynecology | Admitting: Gynecology

## 2014-09-04 DIAGNOSIS — Z01411 Encounter for gynecological examination (general) (routine) with abnormal findings: Secondary | ICD-10-CM | POA: Diagnosis present

## 2014-09-04 DIAGNOSIS — C541 Malignant neoplasm of endometrium: Secondary | ICD-10-CM | POA: Insufficient documentation

## 2014-09-04 DIAGNOSIS — Z8542 Personal history of malignant neoplasm of other parts of uterus: Secondary | ICD-10-CM

## 2014-09-04 NOTE — Patient Instructions (Signed)
Plan to follow up with Dr. Fermin Schwab in August 2016.  Please call in May or June to schedule your appointment.  Please call for any questions or concerns.

## 2014-09-04 NOTE — Progress Notes (Signed)
Consult Note: Gyn-Onc   Veronica Ward 69 y.o. female  Chief Complaint  Patient presents with  . Endometrial Cancer    follow up    Assessment : Endometrial carcinoma (papillary serous) stage I a (March 2015). Status post 6 cycles of carboplatin Taxol chemotherapy and vaginal vault brachytherapy. Patient's clinically free of disease.  Plan:  She will see Dr. Marko Plume and Sondra Come as scheduled. I plan on seeing the patient back in August 2016. Pap smears are obtained today  Interval history: The patient returns today for continuing follow-up of endometrial carcinoma. Since her last visit with gynecologic oncology she's been seen by Dr. Marko Plume and Sondra Come. Today we she reports she's having no problems and her health is been good except for a GI virus 2 weeks ago which cleared spontaneously. She specifically denies any GI or GU symptoms no pelvic pain pressure vaginal bleeding or discharge.  HPI: 69 year old white female seen in consultation request of Dr. Mora Bellman regarding management of a newly diagnosed endometrial cancer. Approximately 3 weeks ago the patient developed some vaginal spotting. She was seen by Dr. Elly Modena on February 25. An endometrial biopsy was obtained showing a grade 2 endometrial carcinoma. The patient is also had an ultrasound of the pelvis revealing a uterus measuring 7 x 3 x 3.7 cm. The endometrial stripe was 7 mm.  [name] She has no other gynecologic history.   Patient underwent robotic hysterectomy bilateral salpingo-oophorectomy and pelvic lymphadenectomy at [facility] on 10/03/2013. 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 Adjuvant therapy with carboplatin and Taxol and vaginal vault brachytherapy was advised. The patient had a uncomplicated postoperative course.     Review of Systems:10 point review of systems is negative except as noted in interval history.   Vitals: Last menstrual period 09/17/2000.  Physical Exam: General : The patient is a healthy woman in no acute distress.  HEENT: normocephalic, extraoccular movements normal; neck is supple without thyromegally  Lynphnodes: Supraclavicular and inguinal nodes not enlarged  Abdomen: Soft, non-tender, no ascites, no organomegally, no masses, no hernias  Pelvic:  EGBUS: Normal female  Vagina: Normal, no lesions, atrophic , the cuff closure is intact and is healing appropriately. Urethra and Bladder: Normal, non-tender  Cervix: Surgically absent Uterus: Surgically absent Bi-manual examination: Non-tender; no adenxal masses or nodularity  Rectal: normal sphincter tone, no masses, no blood  Lower extremities: No edema or varicosities. Normal range of motion      Allergies  Allergen Reactions  . Bactrim [Sulfamethoxazole-Trimethoprim] Rash  . Oxycodone-Acetaminophen Anxiety  . Sulfa Antibiotics Rash    Past Medical History  Diagnosis Date  . Hx of basal cell carcinoma     face  . Diffuse cystic mastopathy   . Headache(784.0)   . Unspecified hemorrhoids without mention of complication   . Other malaise and fatigue   . Disorder of bone and cartilage, unspecified   . Other psoriasis   . Other seborrheic keratosis   . Sleep disturbance, unspecified   . Predominant disturbance of emotions   . Pain in limb   . Urticaria, unspecified   . History of shingles   . Anxiety   . Anemia   . Dysautonomia     mild  . Osteoporosis   . Endometrial cancer   . Ashkenazi Jewish ancestry   .  Radiation 01/06/14, 12/23/13, 12/16/13, 12/09/13    HDR brachytherapy    Past Surgical History  Procedure Laterality Date  . Cervical conization w/bx    . Cervical cancer  1973  . Breast biopsy Left 2001    benign  . Tubal ligation    . Dexa  03/2002    oseopenia  . Colonoscopy  12/04    int.  hemorrhoids  . Dexa  07/2004    decreased BMD, osteopenia  . Dexa  02/2007    Osteopenia  . Breast lumpectomy      left underarm  . Birthmark removal  childhood  . Dexa  9/10    Osteopenia slightly worse  . Felon i&d  12/2010    Dr. Fredna Dow, I&D in OR (L index finger)  . Finger surgery    . Knee surgery      right  . Robotic assisted total hysterectomy with bilateral salpingo oopherectomy  10/03/2013    Robotic-assisted total laparoscopic hysterectomy, bilateral salpingo-oophorectomy, bilateral pelvic and para-aortic lymphadenectomy with sentinel lymph node dissection  . Tonsilectomy, adenoidectomy, bilateral myringotomy and tubes    . Axillary lymph node dissection      left    Current Outpatient Prescriptions  Medication Sig Dispense Refill  . alendronate (FOSAMAX) 70 MG tablet TAKE 1 TABLET BY MOUTH 30 MINUTES BEFOREBREAKFAST ONCE A WEEK WITH ATLEAST 8 OZ. OF WATER. 4 tablet 11  . Ascorbic Acid (VITAMIN C) 100 MG tablet Take 100 mg by mouth daily.    Marland Kitchen CALCIUM PO Take 1,000 mg by mouth daily.    . cholecalciferol (VITAMIN D) 400 UNITS TABS Take by mouth.    . co-enzyme Q-10 30 MG capsule Take 30 mg by mouth 3 (three) times daily.    . fish oil-omega-3 fatty acids 1000 MG capsule Take by mouth 2 (two) times daily.     . FOLBIC 2.5-25-2 MG TABS TAKE 1 TABLET BY MOUTH DAILY 30 each 3  . Hydrocodone-Acetaminophen (VICODIN) 5-300 MG TABS Take 1 tablet by mouth 4 (four) times daily as needed (PAIN). 20 each 0  . hydrocortisone-pramoxine (PROCTOFOAM-HC) rectal foam Place 1 applicator rectally 2 (two) times daily as needed for hemorrhoids. 20 g 1  . ibuprofen (ADVIL,MOTRIN) 200 MG tablet Take 200 mg by mouth as needed for pain.    . Lactobacillus (CVS PROBIOTIC ACIDOPHILUS) 10 MG CAPS Take by mouth.    . loratadine (CLARITIN) 10 MG tablet Take 10 mg by mouth daily as needed for allergies.    . magnesium 30 MG tablet Take 30 mg by mouth daily.    . Multiple Vitamin (MULTIVITAMIN) tablet Take  1 tablet by mouth daily.     No current facility-administered medications for this visit.    History   Social History  . Marital Status: Married    Spouse Name: N/A  . Number of Children: 3  . Years of Education: N/A   Occupational History  . Professor    Social History Main Topics  . Smoking status: Former Smoker    Quit date: 07/25/1963  . Smokeless tobacco: Never Used  . Alcohol Use: 0.6 oz/week    0 Standard drinks or equivalent, 1 Glasses of wine per week     Comment: 1/2 glass of wine a day  . Drug Use: No  . Sexual Activity: Yes    Birth Control/ Protection: Post-menopausal   Other Topics Concern  . Not on file   Social History Narrative   History professor  Likes to dance      Is a walker and does floor exercises      Caffeine: 2 cups daily    Family History  Problem Relation Age of Onset  . Multiple myeloma Father   . Coronary artery disease Father   . Transient ischemic attack Mother   . Lupus Maternal Aunt   . Breast cancer Maternal Aunt     dx 6s; deceased  . Cancer Maternal Grandfather     GI cancer or pancreatic; deceased 60  . Colon cancer Neg Hx   . Ovarian cancer Maternal Aunt     dx 13s; deceased 36s  . Multiple sclerosis Brother       Alvino Chapel, MD 09/04/2014, 12:53 PM

## 2014-09-08 LAB — CYTOLOGY - PAP

## 2014-09-09 ENCOUNTER — Telehealth: Payer: Self-pay | Admitting: Family Medicine

## 2014-09-09 DIAGNOSIS — Z0000 Encounter for general adult medical examination without abnormal findings: Secondary | ICD-10-CM

## 2014-09-09 DIAGNOSIS — M810 Age-related osteoporosis without current pathological fracture: Secondary | ICD-10-CM

## 2014-09-09 DIAGNOSIS — Z13220 Encounter for screening for lipoid disorders: Secondary | ICD-10-CM

## 2014-09-09 DIAGNOSIS — R634 Abnormal weight loss: Secondary | ICD-10-CM

## 2014-09-09 NOTE — Telephone Encounter (Signed)
-----   Message from Ellamae Sia sent at 09/03/2014  4:15 PM EST ----- Regarding: Lab orders for Thursday,2.18.16 Patient is scheduled for CPX labs, please order future labs, Thanks , Karna Christmas

## 2014-09-10 ENCOUNTER — Other Ambulatory Visit (INDEPENDENT_AMBULATORY_CARE_PROVIDER_SITE_OTHER): Payer: Medicare Other

## 2014-09-10 ENCOUNTER — Telehealth: Payer: Self-pay | Admitting: *Deleted

## 2014-09-10 DIAGNOSIS — Z13220 Encounter for screening for lipoid disorders: Secondary | ICD-10-CM

## 2014-09-10 DIAGNOSIS — Z0000 Encounter for general adult medical examination without abnormal findings: Secondary | ICD-10-CM

## 2014-09-10 DIAGNOSIS — Z79899 Other long term (current) drug therapy: Secondary | ICD-10-CM

## 2014-09-10 DIAGNOSIS — M810 Age-related osteoporosis without current pathological fracture: Secondary | ICD-10-CM

## 2014-09-10 LAB — CBC WITH DIFFERENTIAL/PLATELET
Basophils Absolute: 0 10*3/uL (ref 0.0–0.1)
Basophils Relative: 0.5 % (ref 0.0–3.0)
Eosinophils Absolute: 0.1 10*3/uL (ref 0.0–0.7)
Eosinophils Relative: 2.8 % (ref 0.0–5.0)
HCT: 35.9 % — ABNORMAL LOW (ref 36.0–46.0)
Hemoglobin: 12.2 g/dL (ref 12.0–15.0)
Lymphocytes Relative: 27.4 % (ref 12.0–46.0)
Lymphs Abs: 1.1 10*3/uL (ref 0.7–4.0)
MCHC: 34 g/dL (ref 30.0–36.0)
MCV: 95.9 fl (ref 78.0–100.0)
Monocytes Absolute: 0.4 10*3/uL (ref 0.1–1.0)
Monocytes Relative: 10.1 % (ref 3.0–12.0)
Neutro Abs: 2.3 10*3/uL (ref 1.4–7.7)
Neutrophils Relative %: 59.2 % (ref 43.0–77.0)
Platelets: 196 10*3/uL (ref 150.0–400.0)
RBC: 3.74 Mil/uL — ABNORMAL LOW (ref 3.87–5.11)
RDW: 13.1 % (ref 11.5–15.5)
WBC: 3.9 10*3/uL — ABNORMAL LOW (ref 4.0–10.5)

## 2014-09-10 LAB — LIPID PANEL
Cholesterol: 179 mg/dL (ref 0–200)
HDL: 64.6 mg/dL (ref >39.00)
LDL Cholesterol: 104 mg/dL — ABNORMAL HIGH (ref 0–99)
NonHDL: 114.4
Total CHOL/HDL Ratio: 3
Triglycerides: 52 mg/dL (ref 0.0–149.0)
VLDL: 10.4 mg/dL (ref 0.0–40.0)

## 2014-09-10 LAB — COMPREHENSIVE METABOLIC PANEL
ALT: 18 U/L (ref 0–35)
AST: 24 U/L (ref 0–37)
Albumin: 3.9 g/dL (ref 3.5–5.2)
Alkaline Phosphatase: 54 U/L (ref 39–117)
BUN: 17 mg/dL (ref 6–23)
CO2: 30 mEq/L (ref 19–32)
Calcium: 9.4 mg/dL (ref 8.4–10.5)
Chloride: 106 mEq/L (ref 96–112)
Creatinine, Ser: 0.62 mg/dL (ref 0.40–1.20)
GFR: 100.97 mL/min (ref >60.00)
Glucose, Bld: 90 mg/dL (ref 70–99)
Potassium: 3.7 mEq/L (ref 3.5–5.1)
Sodium: 140 mEq/L (ref 135–145)
Total Bilirubin: 0.4 mg/dL (ref 0.2–1.2)
Total Protein: 6.7 g/dL (ref 6.0–8.3)

## 2014-09-10 LAB — VITAMIN D 25 HYDROXY (VIT D DEFICIENCY, FRACTURES): VITD: 54.02 ng/mL (ref 30.00–100.00)

## 2014-09-10 LAB — TSH: TSH: 2.89 u[IU]/mL (ref 0.35–4.50)

## 2014-09-10 NOTE — Telephone Encounter (Signed)
Per Melissa Cross, NP patient notified of normal pap smear results. Patient appreciative of call.  

## 2014-09-18 ENCOUNTER — Encounter: Payer: Self-pay | Admitting: Family Medicine

## 2014-09-18 ENCOUNTER — Ambulatory Visit (INDEPENDENT_AMBULATORY_CARE_PROVIDER_SITE_OTHER): Payer: Medicare Other | Admitting: Family Medicine

## 2014-09-18 VITALS — BP 122/70 | HR 66 | Temp 97.8°F | Ht 63.0 in | Wt 104.1 lb

## 2014-09-18 DIAGNOSIS — M810 Age-related osteoporosis without current pathological fracture: Secondary | ICD-10-CM

## 2014-09-18 DIAGNOSIS — Z0000 Encounter for general adult medical examination without abnormal findings: Secondary | ICD-10-CM

## 2014-09-18 DIAGNOSIS — Z23 Encounter for immunization: Secondary | ICD-10-CM

## 2014-09-18 NOTE — Patient Instructions (Signed)
Take care of yourself  I'm glad you are doing well  prevnar vaccine today

## 2014-09-18 NOTE — Assessment & Plan Note (Signed)
Reviewed health habits including diet and exercise and skin cancer prevention Reviewed appropriate screening tests for age  Also reviewed health mt list, fam hx and immunization status , as well as social and family history   prevnar vaccine today  See HPI Labs reviewed

## 2014-09-18 NOTE — Assessment & Plan Note (Signed)
dexa 10/14 OP  On fosamax- tolerating  Disc need for calcium/ vitamin D/ wt bearing exercise and bone density test every 2 y to monitor Disc safety/ fracture risk in detail   On ca and D and exercising  D level is therapeutic

## 2014-09-18 NOTE — Progress Notes (Signed)
Pre visit review using our clinic review tool, if applicable. No additional management support is needed unless otherwise documented below in the visit note. 

## 2014-09-18 NOTE — Progress Notes (Signed)
Subjective:    Patient ID: Veronica Ward, female    DOB: [birth year], 69 y.o.   MRN: [id number]  HPI Here for annual medicare wellness visit as well as chronic/acute medical problems   Wt is stable with bmi of 18  I have personally reviewed the Medicare Annual Wellness questionnaire and have noted 1. The patient's medical and social history 2. Their use of alcohol, tobacco or illicit drugs 3. Their current medications and supplements 4. The patient's functional ability including ADL's, fall risks, home safety risks and hearing or visual             impairment. 5. Diet and physical activities 6. Evidence for depression or mood disorders  The patients weight, height, BMI have been recorded in the chart and visual acuity is per eye clinic.  I have made referrals, counseling and provided education to the patient based review of the above and I have provided the pt with a written personalized care plan for preventive services.  occ her upper R arm hurts  She finds herself rubbing it  On and off    Is feeling good overall   See scanned forms.  Routine anticipatory guidance given to patient.  See health maintenance. Colon cancer screening 12/14  Breast cancer screening 12/15 normal  Self breast exam-no lumps or changes in her exam  Flu vaccine 9/15 Tetanus vaccine 6/11 Pneumovax 9/12 , will do prevnar today  Zoster vaccine 8/14   Advance directive she has a living will/POA /adv directive  Cognitive function addressed- see scanned forms- and if abnormal then additional documentation follows.  Memory is pretty good - she is still working and publishing and writing 3 papers - doing very well   PMH and SH reviewed  Meds, vitals, and allergies reviewed.   ROS: See HPI.  Otherwise negative.    OP dexa 10/14  Is back on fosamax - tolerates it ok  Ca and D D level is good at 54  Lab Results  Component Value Date   WBC 3.9* 09/10/2014   HGB 12.2 09/10/2014   HCT 35.9*  09/10/2014   MCV 95.9 09/10/2014   PLT 196.0 09/10/2014     Hyperlipidemia Lab Results  Component Value Date   CHOL 179 09/10/2014   CHOL 202* 03/04/2013   CHOL 197 03/29/2011   Lab Results  Component Value Date   HDL 64.60 09/10/2014   HDL 67.50 03/04/2013   HDL 65.00 03/29/2011   Lab Results  Component Value Date   LDLCALC 104* 09/10/2014   LDLCALC 117* 03/29/2011   LDLCALC 109* 12/21/2009   Lab Results  Component Value Date   TRIG 52.0 09/10/2014   TRIG 75.0 03/04/2013   TRIG 76.0 03/29/2011   Lab Results  Component Value Date   CHOLHDL 3 09/10/2014   CHOLHDL 3 03/04/2013   CHOLHDL 3 03/29/2011   Lab Results  Component Value Date   LDLDIRECT 119.6 03/04/2013   Eating more organinc food with smart choices overall  Does watch fats in diet     Chemistry      Component Value Date/Time   NA 140 09/10/2014 0809   NA 140 06/11/2014 1100   NA 137 01/21/2011   K 3.7 09/10/2014 0809   K 4.4 06/11/2014 1100   K 3.9 01/21/2011   CL 106 09/10/2014 0809   CL 104 01/21/2011   CO2 30 09/10/2014 0809   CO2 30* 06/11/2014 1100   CO2 22 01/21/2011   BUN 17 09/10/2014  0809   BUN 15.6 06/11/2014 1100   BUN 25* 01/21/2011   CREATININE 0.62 09/10/2014 0809   CREATININE 0.7 06/11/2014 1100   CREATININE 0.80 01/21/2011   CREATININE 0.80 01/21/2011 0000      Component Value Date/Time   CALCIUM 9.4 09/10/2014 0809   CALCIUM 9.5 06/11/2014 1100   CALCIUM 8.8 01/21/2011   ALKPHOS 54 09/10/2014 0809   ALKPHOS 69 06/11/2014 1100   AST 24 09/10/2014 0809   AST 20 06/11/2014 1100   ALT 18 09/10/2014 0809   ALT 15 06/11/2014 1100   BILITOT 0.4 09/10/2014 0809   BILITOT 0.25 06/11/2014 1100      Lab Results  Component Value Date   TSH 2.89 09/10/2014    Patient Active Problem List   Diagnosis Date Noted  . Infection of thumb 07/15/2014  . Ashkenazi Jewish ancestry   . Endometrial cancer 11/03/2013  . CIS (carcinoma in situ of cervix) 09/23/2013  .  Post-menopausal bleeding 09/08/2013  . Left wrist pain 08/07/2013  . Encounter for Medicare annual wellness exam 03/12/2013  . Colon cancer screening 03/12/2013  . Routine general medical examination at a health care facility 03/03/2013  . Foot pain 02/02/2012  . Left foot pain 01/17/2012  . Right shoulder pain 01/02/2012  . Right arm pain 01/02/2012  . Low back pain 11/10/2011  . Other screening mammogram 04/05/2011  . Screening for lipoid disorders 01/15/2011  . Encounter for medication monitoring 11/23/2010  . Weight loss 11/23/2010  . MALAISE AND FATIGUE 02/17/2010  . HEADACHE 02/17/2010  . TOE PAIN 12/29/2009  . SEBORRHEIC KERATOSIS 08/25/2009  . STRESS REACTION, ACUTE, WITH EMOTIONAL DISTURBANCE 12/09/2008  . SLEEP DISORDER 12/09/2008  . HEMORRHOIDS 10/12/2008  . Osteoporosis 10/08/2007  . BASAL CELL CARCINOMA, FACE 06/21/2007  . FIBROCYSTIC BREAST DISEASE 06/21/2007  . PSORIASIS 06/21/2007  . URTICARIA 06/21/2007  . BASAL CELL CARCINOMA, FACE 06/21/2007   Past Medical History  Diagnosis Date  . Hx of basal cell carcinoma     face  . Diffuse cystic mastopathy   . Headache(784.0)   . Unspecified hemorrhoids without mention of complication   . Other malaise and fatigue   . Disorder of bone and cartilage, unspecified   . Other psoriasis   . Other seborrheic keratosis   . Sleep disturbance, unspecified   . Predominant disturbance of emotions   . Pain in limb   . Urticaria, unspecified   . History of shingles   . Anxiety   . Anemia   . Dysautonomia     mild  . Osteoporosis   . Endometrial cancer   . Ashkenazi Jewish ancestry   . Radiation 01/06/14, 12/23/13, 12/16/13, 12/09/13    HDR brachytherapy   Past Surgical History  Procedure Laterality Date  . Cervical conization w/bx    . Cervical cancer  1973  . Breast biopsy Left 2001    benign  . Tubal ligation    . Dexa  03/2002    oseopenia  . Colonoscopy  12/04    int. hemorrhoids  . Dexa  07/2004    decreased  BMD, osteopenia  . Dexa  02/2007    Osteopenia  . Breast lumpectomy      left underarm  . Birthmark removal  childhood  . Dexa  9/10    Osteopenia slightly worse  . Felon i&d  12/2010    Dr. Fredna Dow, I&D in OR (L index finger)  . Finger surgery    . Knee surgery  right  . Robotic assisted total hysterectomy with bilateral salpingo oopherectomy  10/03/2013    Robotic-assisted total laparoscopic hysterectomy, bilateral salpingo-oophorectomy, bilateral pelvic and para-aortic lymphadenectomy with sentinel lymph node dissection  . Tonsilectomy, adenoidectomy, bilateral myringotomy and tubes    . Axillary lymph node dissection      left   History  Substance Use Topics  . Smoking status: Former Smoker    Quit date: 07/25/1963  . Smokeless tobacco: Never Used  . Alcohol Use: 0.6 oz/week    0 Standard drinks or equivalent, 1 Glasses of wine per week     Comment: 1/2 glass of wine a day   Family History  Problem Relation Age of Onset  . Multiple myeloma Father   . Coronary artery disease Father   . Transient ischemic attack Mother   . Lupus Maternal Aunt   . Breast cancer Maternal Aunt     dx 40s; deceased  . Cancer Maternal Grandfather     GI cancer or pancreatic; deceased 16  . Colon cancer Neg Hx   . Ovarian cancer Maternal Aunt     dx 37s; deceased 37s  . Multiple sclerosis Brother    Allergies  Allergen Reactions  . Bactrim [Sulfamethoxazole-Trimethoprim] Rash  . Oxycodone-Acetaminophen Anxiety  . Sulfa Antibiotics Rash   Current Outpatient Prescriptions on File Prior to Visit  Medication Sig Dispense Refill  . alendronate (FOSAMAX) 70 MG tablet TAKE 1 TABLET BY MOUTH 30 MINUTES BEFOREBREAKFAST ONCE A WEEK WITH ATLEAST 8 OZ. OF WATER. 4 tablet 11  . Ascorbic Acid (VITAMIN C) 100 MG tablet Take 100 mg by mouth daily.    Marland Kitchen CALCIUM PO Take 1,000 mg by mouth daily.    . cholecalciferol (VITAMIN D) 400 UNITS TABS Take by mouth.    . co-enzyme Q-10 30 MG capsule Take 30 mg  by mouth 3 (three) times daily.    . fish oil-omega-3 fatty acids 1000 MG capsule Take by mouth 2 (two) times daily.     . FOLBIC 2.5-25-2 MG TABS TAKE 1 TABLET BY MOUTH DAILY 30 each 3  . ibuprofen (ADVIL,MOTRIN) 200 MG tablet Take 200 mg by mouth as needed for pain.    . Lactobacillus (CVS PROBIOTIC ACIDOPHILUS) 10 MG CAPS Take by mouth.    . magnesium 30 MG tablet Take 30 mg by mouth daily.    . Multiple Vitamin (MULTIVITAMIN) tablet Take 1 tablet by mouth daily.    . Hydrocodone-Acetaminophen (VICODIN) 5-300 MG TABS Take 1 tablet by mouth 4 (four) times daily as needed (PAIN). (Patient not taking: Reported on 09/18/2014) 20 each 0  . hydrocortisone-pramoxine (PROCTOFOAM-HC) rectal foam Place 1 applicator rectally 2 (two) times daily as needed for hemorrhoids. (Patient not taking: Reported on 09/18/2014) 20 g 1  . loratadine (CLARITIN) 10 MG tablet Take 10 mg by mouth daily as needed for allergies.     No current facility-administered medications on file prior to visit.       Review of Systems Review of Systems  Constitutional: Negative for fever, appetite change, fatigue and unexpected weight change.  Eyes: Negative for pain and visual disturbance.  Respiratory: Negative for cough and shortness of breath.   Cardiovascular: Negative for cp or palpitations    Gastrointestinal: Negative for nausea, diarrhea and constipation.  Genitourinary: Negative for urgency and frequency.  Skin: Negative for pallor or rash   Neurological: Negative for weakness, light-headedness, numbness and headaches.  Hematological: Negative for adenopathy. Does not bruise/bleed easily.  Psychiatric/Behavioral: Negative for dysphoric  mood. The patient is not nervous/anxious.         Objective:   Physical Exam  Constitutional: She appears well-developed and well-nourished. No distress.  HENT:  Head: Normocephalic and atraumatic.  Right Ear: External ear normal.  Left Ear: External ear normal.  Mouth/Throat:  Oropharynx is clear and moist.  Eyes: Conjunctivae and EOM are normal. Pupils are equal, round, and reactive to light. No scleral icterus.  Neck: Normal range of motion. Neck supple. No JVD present. Carotid bruit is not present. No thyromegaly present.  Cardiovascular: Normal rate, regular rhythm, normal heart sounds and intact distal pulses.  Exam reveals no gallop.   Pulmonary/Chest: Effort normal and breath sounds normal. No respiratory distress. She has no wheezes. She exhibits no tenderness.  Abdominal: Soft. Bowel sounds are normal. She exhibits no distension, no abdominal bruit and no mass. There is no tenderness.  Genitourinary: No breast swelling, tenderness, discharge or bleeding.  Breast exam: No mass, nodules, thickening, tenderness, bulging, retraction, inflamation, nipple discharge or skin changes noted.  No axillary or clavicular LA.      Musculoskeletal: Normal range of motion. She exhibits no edema or tenderness.  Lymphadenopathy:    She has no cervical adenopathy.  Neurological: She is alert. She has normal reflexes. No cranial nerve deficit. She exhibits normal muscle tone. Coordination normal.  Skin: Skin is warm and dry. No rash noted. No erythema. No pallor.  Solar lentigos diffusely   Psychiatric: She has a normal mood and affect.          Assessment & Plan:   Problem List Items Addressed This Visit      Musculoskeletal and Integument   Osteoporosis - Primary    dexa 10/14 OP  On fosamax- tolerating  Disc need for calcium/ vitamin D/ wt bearing exercise and bone density test every 2 y to monitor Disc safety/ fracture risk in detail   On ca and D and exercising  D level is therapeutic           Other   Encounter for Medicare annual wellness exam    Reviewed health habits including diet and exercise and skin cancer prevention Reviewed appropriate screening tests for age  Also reviewed health mt list, fam hx and immunization status , as well as social  and family history   prevnar vaccine today  See HPI Labs reviewed          Other Visit Diagnoses    Need for vaccination with 13-polyvalent pneumococcal conjugate vaccine        Relevant Orders    Pneumococcal conjugate vaccine 13-valent IM (Completed)

## 2014-09-28 NOTE — Progress Notes (Signed)
This encounter was created in error - please disregard.

## 2014-09-29 ENCOUNTER — Ambulatory Visit (INDEPENDENT_AMBULATORY_CARE_PROVIDER_SITE_OTHER): Payer: Medicare Other | Admitting: Family Medicine

## 2014-09-29 ENCOUNTER — Encounter: Payer: Self-pay | Admitting: Family Medicine

## 2014-09-29 ENCOUNTER — Ambulatory Visit (INDEPENDENT_AMBULATORY_CARE_PROVIDER_SITE_OTHER)
Admission: RE | Admit: 2014-09-29 | Discharge: 2014-09-29 | Disposition: A | Discharge status: Home / Self Care | Payer: Medicare Other | Source: Ambulatory Visit | Attending: Family Medicine | Admitting: Family Medicine

## 2014-09-29 VITALS — BP 110/70 | HR 86 | Temp 98.7°F | Ht 63.0 in | Wt 105.0 lb

## 2014-09-29 DIAGNOSIS — M25512 Pain in left shoulder: Secondary | ICD-10-CM

## 2014-09-29 MED ORDER — MELOXICAM 15 MG PO TABS: 15.0000 mg | ORAL_TABLET | Freq: Every day | ORAL | Status: DC

## 2014-09-29 NOTE — Patient Instructions (Signed)
Xray of shoulder today  Start mobic 15 mg with food daily for 2 weeks (stop if GI upset)  Use cold compress as often as you can  Do the finger crawl wall exercise when you can   In 2 weeks if not better - call or email - we will likely have you see Dr Lorelei Pont

## 2014-09-29 NOTE — Progress Notes (Signed)
Pre visit review using our clinic review tool, if applicable. No additional management support is needed unless otherwise documented below in the visit note. 

## 2014-09-29 NOTE — Progress Notes (Signed)
Subjective:    Patient ID: Veronica Ward, female    DOB: 1945-09-27, 69 y.o.   MRN: [id number]  HPI Here for L arm symptoms   Hurts to abduct it and move it over her head  Also feels like it may be a bit weak  The ache comes and goes  Points to upper arm -deltoid area   She has taken advil as needed-it does help a bit  No tingling   Patient Active Problem List   Diagnosis Date Noted  . Left shoulder pain 09/29/2014  . Ashkenazi Jewish ancestry   . Endometrial cancer 11/03/2013  . CIS (carcinoma in situ of cervix) 09/23/2013  . Encounter for Medicare annual wellness exam 03/12/2013  . Colon cancer screening 03/12/2013  . Routine general medical examination at a health care facility 03/03/2013  . Encounter for medication monitoring 11/23/2010  . SEBORRHEIC KERATOSIS 08/25/2009  . STRESS REACTION, ACUTE, WITH EMOTIONAL DISTURBANCE 12/09/2008  . SLEEP DISORDER 12/09/2008  . HEMORRHOIDS 10/12/2008  . Osteoporosis 10/08/2007  . BASAL CELL CARCINOMA, FACE 06/21/2007  . FIBROCYSTIC BREAST DISEASE 06/21/2007  . PSORIASIS 06/21/2007  . URTICARIA 06/21/2007  . BASAL CELL CARCINOMA, FACE 06/21/2007   Past Medical History  Diagnosis Date  . Hx of basal cell carcinoma     face  . Diffuse cystic mastopathy   . Headache(784.0)   . Unspecified hemorrhoids without mention of complication   . Other malaise and fatigue   . Disorder of bone and cartilage, unspecified   . Other psoriasis   . Other seborrheic keratosis   . Sleep disturbance, unspecified   . Predominant disturbance of emotions   . Pain in limb   . Urticaria, unspecified   . History of shingles   . Anxiety   . Anemia   . Dysautonomia     mild  . Osteoporosis   . Endometrial cancer   . Ashkenazi Jewish ancestry   . Radiation 01/06/14, 12/23/13, 12/16/13, 12/09/13    HDR brachytherapy   Past Surgical History  Procedure Laterality Date  . Cervical conization w/bx    . Cervical cancer  1973  . Breast biopsy Left  2001    benign  . Tubal ligation    . Dexa  03/2002    oseopenia  . Colonoscopy  12/04    int. hemorrhoids  . Dexa  07/2004    decreased BMD, osteopenia  . Dexa  02/2007    Osteopenia  . Breast lumpectomy      left underarm  . Birthmark removal  childhood  . Dexa  9/10    Osteopenia slightly worse  . Felon i&d  12/2010    Dr. Fredna Dow, I&D in OR (L index finger)  . Finger surgery    . Knee surgery      right  . Robotic assisted total hysterectomy with bilateral salpingo oopherectomy  10/03/2013    Robotic-assisted total laparoscopic hysterectomy, bilateral salpingo-oophorectomy, bilateral pelvic and para-aortic lymphadenectomy with sentinel lymph node dissection  . Tonsilectomy, adenoidectomy, bilateral myringotomy and tubes    . Axillary lymph node dissection      left   History  Substance Use Topics  . Smoking status: Former Smoker    Quit date: 07/25/1963  . Smokeless tobacco: Never Used  . Alcohol Use: 0.6 oz/week    0 Standard drinks or equivalent, 1 Glasses of wine per week     Comment: 1/2 glass of wine a day   Family History  Problem Relation Age  of Onset  . Multiple myeloma Father   . Coronary artery disease Father   . Transient ischemic attack Mother   . Lupus Maternal Aunt   . Breast cancer Maternal Aunt     dx 15s; deceased  . Cancer Maternal Grandfather     GI cancer or pancreatic; deceased 23  . Colon cancer Neg Hx   . Ovarian cancer Maternal Aunt     dx 41s; deceased 79s  . Multiple sclerosis Brother    Allergies  Allergen Reactions  . Bactrim [Sulfamethoxazole-Trimethoprim] Rash  . Oxycodone-Acetaminophen Anxiety  . Sulfa Antibiotics Rash   Current Outpatient Prescriptions on File Prior to Visit  Medication Sig Dispense Refill  . alendronate (FOSAMAX) 70 MG tablet TAKE 1 TABLET BY MOUTH 30 MINUTES BEFOREBREAKFAST ONCE A WEEK WITH ATLEAST 8 OZ. OF WATER. 4 tablet 11  . Ascorbic Acid (VITAMIN C) 100 MG tablet Take 100 mg by mouth daily.    Marland Kitchen CALCIUM  PO Take 1,000 mg by mouth daily.    . cholecalciferol (VITAMIN D) 400 UNITS TABS Take by mouth.    . co-enzyme Q-10 30 MG capsule Take 30 mg by mouth 3 (three) times daily.    . fish oil-omega-3 fatty acids 1000 MG capsule Take by mouth 2 (two) times daily.     . FOLBIC 2.5-25-2 MG TABS TAKE 1 TABLET BY MOUTH DAILY 30 each 3  . ibuprofen (ADVIL,MOTRIN) 200 MG tablet Take 200 mg by mouth as needed for pain.    . Lactobacillus (CVS PROBIOTIC ACIDOPHILUS) 10 MG CAPS Take by mouth.    . loratadine (CLARITIN) 10 MG tablet Take 10 mg by mouth daily as needed for allergies.    . magnesium 30 MG tablet Take 30 mg by mouth daily.    . Multiple Vitamin (MULTIVITAMIN) tablet Take 1 tablet by mouth daily.     No current facility-administered medications on file prior to visit.     Review of Systems Review of Systems  Constitutional: Negative for fever, appetite change, fatigue and unexpected weight change.  Eyes: Negative for pain and visual disturbance.  Respiratory: Negative for cough and shortness of breath.   Cardiovascular: Negative for cp or palpitations    Gastrointestinal: Negative for nausea, diarrhea and constipation.  Genitourinary: Negative for urgency and frequency.  Skin: Negative for pallor or rash   MSK pos for L upper arm pain w/o swelling  Neurological: Negative for weakness, light-headedness, numbness and headaches.  Hematological: Negative for adenopathy. Does not bruise/bleed easily.  Psychiatric/Behavioral: Negative for dysphoric mood. The patient is not nervous/anxious.         Objective:   Physical Exam  Constitutional: She appears well-developed and well-nourished. No distress.  Slim and well app  HENT:  Head: Normocephalic and atraumatic.  Eyes: Conjunctivae and EOM are normal. Pupils are equal, round, and reactive to light.  Neck: Normal range of motion. Neck supple.  Cardiovascular: Normal rate and regular rhythm.   Pulmonary/Chest: Effort normal and breath  sounds normal.  Musculoskeletal: She exhibits tenderness. She exhibits no edema.       Left shoulder: She exhibits decreased range of motion, tenderness and bony tenderness. She exhibits no swelling, no effusion and no crepitus.  Tender over acromion and biceps tendon  Pos hawking and neer Some pain with int/ext rot of shoulder and full active abduction (less with passive abd) No loss of strength or neuro changes  No skin changes     Lymphadenopathy:    She has no  cervical adenopathy.  Neurological: She is alert. She has normal strength and normal reflexes. She displays no atrophy. No sensory deficit. She exhibits normal muscle tone.  Skin: Skin is warm and dry. No rash noted. No erythema.  Psychiatric: She has a normal mood and affect.          Assessment & Plan:   Problem List Items Addressed This Visit      Other   Left shoulder pain - Primary    Suspect tendonitis/bursitis xr today-pend report  Disc use of ice and relative rest with passive rom exericses Change nsaid to mobic 15 mg daily with food  If no imp in 2 week-consider sport med referral       Relevant Orders   DG Shoulder Left

## 2014-09-30 ENCOUNTER — Telehealth: Payer: Self-pay | Admitting: Family Medicine

## 2014-09-30 ENCOUNTER — Telehealth: Payer: Self-pay

## 2014-09-30 NOTE — Telephone Encounter (Signed)
Patient returned Shannon's call and said she did receive her results on my chart.  Patient said you don't need to call her back.

## 2014-09-30 NOTE — Telephone Encounter (Signed)
-----   Message from Abner Greenspan, MD sent at 09/30/2014  8:08 AM EST ----- Please let pt know that her result is on my chart She was having trouble accessing it earlier- I just want to make sure she can

## 2014-09-30 NOTE — Assessment & Plan Note (Signed)
Suspect tendonitis/bursitis xr today-pend report  Disc use of ice and relative rest with passive rom exericses Change nsaid to mobic 15 mg daily with food  If no imp in 2 week-consider sport med referral

## 2014-09-30 NOTE — Telephone Encounter (Signed)
Left message to inform patient of her lab results.

## 2014-10-28 ENCOUNTER — Telehealth: Payer: Self-pay | Admitting: Oncology

## 2014-10-28 NOTE — Telephone Encounter (Signed)
pt cld to state tjat she thought she had an appt set upw [facility]-per louise pt does not have appt-last pof stated ll prn-adv pt next appt was with [facility]-pt understood/adv she would be referred back to [facility] per Barbaraann Share if needed

## 2014-11-12 ENCOUNTER — Telehealth: Payer: Self-pay

## 2014-11-12 ENCOUNTER — Other Ambulatory Visit: Payer: Self-pay | Admitting: Oncology

## 2014-11-12 NOTE — Telephone Encounter (Signed)
Tendonitis would not be related to previous chemo. Please let her know

## 2014-11-12 NOTE — Telephone Encounter (Signed)
Pt called stating her last chemo, carbo/taxo was Aug 4th and she was doing well. Now she has some tendonitis in L arm and is wondering if this could be side effect from her chemo, An x-ray was neg, a pain rx from another MD was too strong, and the tendonitis is worsening.

## 2014-11-12 NOTE — Telephone Encounter (Signed)
Called pt let her know tendonitis is not related to previous chemo per [facility]. Pt said thank you.

## 2014-12-03 ENCOUNTER — Other Ambulatory Visit (HOSPITAL_COMMUNITY)
Admission: RE | Admit: 2014-12-03 | Discharge: 2014-12-03 | Disposition: A | Discharge status: Home / Self Care | Payer: Medicare Other | Source: Ambulatory Visit | Attending: Radiation Oncology | Admitting: Radiation Oncology

## 2014-12-03 ENCOUNTER — Ambulatory Visit
Admission: RE | Admit: 2014-12-03 | Discharge: 2014-12-03 | Disposition: A | Discharge status: Home / Self Care | Payer: Medicare Other | Source: Ambulatory Visit | Attending: Radiation Oncology | Admitting: Radiation Oncology

## 2014-12-03 ENCOUNTER — Encounter: Payer: Self-pay | Admitting: Family Medicine

## 2014-12-03 ENCOUNTER — Encounter: Payer: Self-pay | Admitting: Radiation Oncology

## 2014-12-03 VITALS — BP 134/56 | HR 71 | Temp 98.6°F | Resp 16 | Ht 63.0 in | Wt 107.7 lb

## 2014-12-03 DIAGNOSIS — C541 Malignant neoplasm of endometrium: Secondary | ICD-10-CM

## 2014-12-03 DIAGNOSIS — Z01411 Encounter for gynecological examination (general) (routine) with abnormal findings: Secondary | ICD-10-CM | POA: Diagnosis present

## 2014-12-03 NOTE — Progress Notes (Signed)
Radiation Oncology         [phone] ________________________________  Name: Veronica Ward MRN: [id number]  Date: 12/03/2014  DOB: 10/07/1945  Follow-Up Visit Note  CC: Veronica Pardon, MD  Veronica Ward,*   Diagnosis:  Endometrial  cancer, serous, stage IA  Interval Since Last Radiation:  11 months  Narrative:  The patient returns today for routine follow-up.  Veronica Ward here for follow up. She reports pain, since she had a flu shot in February, in her left upper arm radiating to her fingers. She says it sometimes is deep and throbbing and sometimes sharp. She said it is keeping her awake a night. She had an xray 3/9 which was normal. Her primary care doctor said it was tendonitis. She denies bladder and bowel issues, nausea and vaginal bleeding or discharge. She is using her vaginal dilator once a week. She denies any vaginal bleeding or pelvic pain. She denies any bleeding after using her dilator. The patient denies any postcoital bleeding or rectal bleeding                            ALLERGIES:  is allergic to bactrim; oxycodone-acetaminophen; and sulfa antibiotics.  Meds: Current Outpatient Prescriptions  Medication Sig Dispense Refill  . alendronate (FOSAMAX) 70 MG tablet TAKE 1 TABLET BY MOUTH 30 MINUTES BEFOREBREAKFAST ONCE A WEEK WITH ATLEAST 8 OZ. OF WATER. 4 tablet 11  . Ascorbic Acid (VITAMIN C) 100 MG tablet Take 100 mg by mouth daily.    Marland Kitchen CALCIUM PO Take 1,000 mg by mouth daily.    . cholecalciferol (VITAMIN D) 400 UNITS TABS Take by mouth.    . co-enzyme Q-10 30 MG capsule Take 30 mg by mouth 3 (three) times daily.    . fish oil-omega-3 fatty acids 1000 MG capsule Take by mouth 2 (two) times daily.     . FOLBIC 2.5-25-2 MG TABS TAKE 1 TABLET BY MOUTH DAILY (Patient not taking: Reported on 12/03/2014) 30 each 3  . folic acid (FOLVITE) 1 MG tablet Take 1 mg by mouth daily.    Marland Kitchen ibuprofen (ADVIL,MOTRIN) 200 MG tablet Take 200 mg by mouth as needed for  pain.    . Lactobacillus (CVS PROBIOTIC ACIDOPHILUS) 10 MG CAPS Take by mouth.    . loratadine (CLARITIN) 10 MG tablet Take 10 mg by mouth daily as needed for allergies.    . magnesium 30 MG tablet Take 30 mg by mouth daily.    . meloxicam (MOBIC) 15 MG tablet Take 1 tablet (15 mg total) by mouth daily. Take with food (Patient not taking: Reported on 12/03/2014) 30 tablet 0  . Multiple Vitamin (MULTIVITAMIN) tablet Take 1 tablet by mouth daily.    Marland Kitchen pyridOXINE (VITAMIN B-6) 100 MG tablet Take 100 mg by mouth daily.    . vitamin B-12 (CYANOCOBALAMIN) 100 MCG tablet Take 100 mcg by mouth daily.     No current facility-administered medications for this encounter.    Physical Findings: The patient is in no acute distress. Patient is alert and oriented.  height is 5\' 3"  (1.6 m) and weight is 107 lb 11.2 oz (48.852 kg). Her oral temperature is 98.6 F (37 C). Her blood pressure is 134/56 and her pulse is 71. Her respiration is 16. . The lungs are clear to auscultation. The heart has regular rhythm and rate. The abdomen is soft nontender with normal bowel sounds. No inguinal adenopathy appreciated. On pelvic examination the  external genitalia are unremarkable. A speculum exam is performed. There are no mucosal lesions noted in the vaginal vault. A Pap smear was obtained of the proximal vagina. On bimanual and rectovaginal examination there no pelvic masses appreciated.  Lab Findings: Lab Results  Component Value Date   WBC 3.9* 09/10/2014   HGB 12.2 09/10/2014   HCT 35.9* 09/10/2014   MCV 95.9 09/10/2014   PLT 196.0 09/10/2014    Radiographic Findings: No results found.  Impression:  No evidence recurrence on clinical exam today, Pap smear pending  Plan:  Routine follow-up in 6 months. In the interim the patient to be seen by gynecologic oncology. I have recommended she follow-up with her primary care physician in light of her left shoulder pain since this seems to be persistent.  This  document serves as a record of services personally performed by Gery Pray, MD. It was created on his behalf by Jeralene Peters, a trained medical scribe. The creation of this record is based on the scribe's personal observations and the provider's statements to them. This document has been checked and approved by the attending provider.      ____________________________________ -----------------------------------  Blair Promise, PhD, MD

## 2014-12-03 NOTE — Progress Notes (Addendum)
Veronica Ward here for follow up.  She reports pain, since she had a flu shot in February, in her left upper arm radiating to her fingers.  She says it sometimes is deep and throbbing and sometimes sharp.  She said it is keeping her awake a night.  She had an xray 3/9 which was normal.  Her primary care doctor said it was tendonitis.  She denies bladder and bowel issues, nausea and vaginal bleeding or discharge.  She is using her vaginal dilator once a week.  BP 134/56 mmHg  Pulse 71  Temp(Src) 98.6 F (37 C) (Oral)  Resp 16  Ht 5\' 3"  (1.6 m)  Wt 107 lb 11.2 oz (48.852 kg)  BMI 19.08 kg/m2  LMP 09/17/2000

## 2014-12-07 ENCOUNTER — Ambulatory Visit (INDEPENDENT_AMBULATORY_CARE_PROVIDER_SITE_OTHER): Payer: Medicare Other | Admitting: Family Medicine

## 2014-12-07 ENCOUNTER — Encounter: Payer: Self-pay | Admitting: Family Medicine

## 2014-12-07 VITALS — BP 110/60 | HR 71 | Temp 98.4°F | Ht 63.0 in | Wt 105.0 lb

## 2014-12-07 DIAGNOSIS — M7552 Bursitis of left shoulder: Secondary | ICD-10-CM | POA: Diagnosis not present

## 2014-12-07 DIAGNOSIS — T8090XS Unspecified complication following infusion and therapeutic injection, sequela: Secondary | ICD-10-CM | POA: Diagnosis not present

## 2014-12-07 DIAGNOSIS — M7502 Adhesive capsulitis of left shoulder: Secondary | ICD-10-CM | POA: Diagnosis not present

## 2014-12-07 DIAGNOSIS — M7582 Other shoulder lesions, left shoulder: Secondary | ICD-10-CM | POA: Diagnosis not present

## 2014-12-07 LAB — CYTOLOGY - PAP

## 2014-12-07 NOTE — Progress Notes (Signed)
Dr. Frederico Hamman T. Paisly Fingerhut, MD, [facility] and [facility] [place], [address] Phone: [phone] Fax: [phone]  12/07/2014  Patient: Veronica Ward, MRN: [id number], DOB: Jan 23, 1946, 69 y.o.  Primary Physician:  [name], MD  Chief Complaint: Arm Pain  Subjective:   Veronica Ward is a 69 y.o. very pleasant female patient who presents with the following:  Consulting Physician: Dr. Glori Bickers  My partner asked me to evaluate the patient for left-sided shoulder pain.  The patient reports a history where she obtained a Prevnar 13 vaccine on September 18, 2014.  She states that she received this vaccine in the location around the inferior aspect of her deltoid or just below this per her report. She tells me that she had a topical reaction after the injection, and she developed pain in this region after her injection at that time.  She denies having had any sort of problem or pain in that region prior to the date of this injection.  09/18/2014 Prevnar vaccine.  Subsequently, after this she did develop some pain around this region locally, and she also felt as if she had some difference in her strength.  She does relate an event where she was at [employer], and she picked up a prepackaged supply of watermelon with 3 or 4 slices of watermelon, and she was unable to hold this and subsequently dropped it. She states that she had other instances as well, and has felt like her strength has not improved, however recently, she has not had such a problem.  Her symptoms have changed over time. She saw Dr. Glori Bickers on 09/29/2014, and she seems to indicate that the patient had subacromial bursitis and RTC tendinopathy at that time. She was placed on RICE and mobic, had continued symptoms and followed up with me today.  t this point her pain has been throughout the left upper extremity and concentrated in the shoulder.  She has a throbbing type pain per her  description that is all over the arm and seems to be in a T-shirt distribution.  She has pain at nighttime, and she also has pain in abduction.  She has no significant history of shoulder trauma, fracture, dislocation, or operative intervention.  RTC and secondary frozen shoulder.   Past Medical History, Surgical History, Social History, Family History, Problem List, Medications, and Allergies have been reviewed and updated if relevant.  GEN: No fevers, chills. Nontoxic. Primarily MSK c/o today. MSK: Detailed in the HPI GI: tolerating PO intake without difficulty Neuro: as above Otherwise the pertinent positives of the ROS are noted above.   Objective:   BP 110/60 mmHg  Pulse 71  Temp(Src) 98.4 F (36.9 C) (Oral)  Ht 5\' 3"  (1.6 m)  Wt 105 lb (47.628 kg)  BMI 18.60 kg/m2  LMP 09/17/2000   GEN: WDWN, NAD, Non-toxic, Alert & Oriented x 3 HEENT: Atraumatic, Normocephalic.  Ears and Nose: No external deformity. EXTR: No clubbing/cyanosis/edema NEURO: Normal gait.  PSYCH: Normally interactive. Conversant. Not depressed or anxious appearing.  Calm demeanor.    Cervical spine: Full range of motion.  No inducible pain with motion at cervical spine.  Posterior paracervical musculature is normal and not tender to palpation.  Minimal tenderness in the upper trapezius musculature.  Spurling's examination is negative.  Left shoulder: Nontender along the clavicle.  Nontender at the acromioclavicular joint.  There is notable tenderness in the bicipital groove and in the subacromial bursa.  Abduction, strength is 5/5.  Terminal motion lacks 15 compared to the contralateral side. Flexion, strength is 5/5.  Terminal motion lacks 10 compared to the contralateral side. Obtained in 90 of abduction. Internal range of motion, strength is 5/5.  Terminal motion lacks approximately 45 compared to the contralateral side. External range of motion, strength is 5/5.  Terminal motion lacks approximately  20 compared to the contralateral side.  Strength testing at the elbow with flexion and extension is 5/5. Grip testing is 5/5. Flexion and extension at the wrist is 5/5.  Both pinprick and gross sensation are normal throughout the upper extremities. Deep tendon reflexes are 2+ in the upper extremities.  Crossover testing is markedly positive. Neer testing is markedly positive. Michel Bickers test is markedly positive.  C5-T1 appear fully intact on exam today  Radiology: EXAM: LEFT SHOULDER - 2+ VIEW  COMPARISON: None.  FINDINGS: There is no evidence of fracture or dislocation. There is no evidence of arthropathy or other focal bone abnormality. Soft tissues are unremarkable.  IMPRESSION: Negative.   Electronically Signed  By: Rolm Baptise M.D.  On: 09/30/2014 08:02  Assessment and Plan:   Rotator cuff tendonitis, left - Plan: Ambulatory referral to Physical Therapy  Subacromial bursitis, left - Plan: Ambulatory referral to Physical Therapy  Injection site reaction, sequela - Plan: Ambulatory referral to Physical Therapy  Adhesive capsulitis, left  It would seem as if this patient developed some secondary shoulder issues after the injection described above.  Today, she does certainly have rotator cuff tendinopathy, subacromial bursitis, and she also has what is likely an adhesive capsulitis in its early stages.  These would seem to be secondary, more likely due to pain, altered mechanics, and changes that are described above.  Her description would seem to indicate that there was a nerve injury or transient neurapraxia that occurred after her Prevnar 13 injection.  Today, her neurological exam appears normal.  If she has future issues, that I would recommend obtaining nerve conduction studies and an EMG to conclusively evaluate.  I suspect that she will do well, but these issues typically take some time to resolve.  I would prefer to begin conservatively with  this case, work on her range of motion and rotator cuff strengthening and scapular stabilization.  If she fails progress, then we can consider other interventions.  The patient and I both felt like holding off on it and any kind of injection at this point would be logical given the history above.  Follow-up: 2 mo  New Prescriptions   No medications on file   Orders Placed This Encounter  Procedures  . Ambulatory referral to Physical Therapy    Signed,  Frederico Hamman T. Aamira Bischoff, MD   Patient's Medications  New Prescriptions   No medications on file  Previous Medications   ALENDRONATE (FOSAMAX) 70 MG TABLET    TAKE 1 TABLET BY MOUTH 30 MINUTES BEFOREBREAKFAST ONCE A WEEK WITH ATLEAST 8 OZ. OF WATER.   ASCORBIC ACID (VITAMIN C) 100 MG TABLET    Take 100 mg by mouth daily.   CALCIUM PO    Take 1,000 mg by mouth daily.   CHOLECALCIFEROL (VITAMIN D) 400 UNITS TABS    Take by mouth.   CO-ENZYME Q-10 30 MG CAPSULE    Take 30 mg by mouth 3 (three) times daily.   FISH OIL-OMEGA-3 FATTY ACIDS 1000 MG CAPSULE    Take by mouth 2 (two) times daily.    FOLIC ACID (FOLVITE) 1 MG TABLET  Take 1 mg by mouth daily.   IBUPROFEN (ADVIL,MOTRIN) 200 MG TABLET    Take 200 mg by mouth as needed for pain.   LACTOBACILLUS (CVS PROBIOTIC ACIDOPHILUS) 10 MG CAPS    Take by mouth.   LORATADINE (CLARITIN) 10 MG TABLET    Take 10 mg by mouth daily as needed for allergies.   MAGNESIUM 30 MG TABLET    Take 30 mg by mouth daily.   MULTIPLE VITAMIN (MULTIVITAMIN) TABLET    Take 1 tablet by mouth daily.   PYRIDOXINE (VITAMIN B-6) 100 MG TABLET    Take 100 mg by mouth daily.   VITAMIN B-12 (CYANOCOBALAMIN) 100 MCG TABLET    Take 100 mcg by mouth daily.  Modified Medications   No medications on file  Discontinued Medications   FOLBIC 2.5-25-2 MG TABS    TAKE 1 TABLET BY MOUTH DAILY   MELOXICAM (MOBIC) 15 MG TABLET    Take 1 tablet (15 mg total) by mouth daily. Take with food

## 2014-12-07 NOTE — Progress Notes (Signed)
Pre visit review using our clinic review tool, if applicable. No additional management support is needed unless otherwise documented below in the visit note. 

## 2014-12-07 NOTE — Patient Instructions (Signed)

## 2014-12-09 ENCOUNTER — Telehealth: Payer: Self-pay | Admitting: Oncology

## 2014-12-09 NOTE — Telephone Encounter (Signed)
Left a message for Sharyn Lull regarding her pap smear results.  Requested a return call.

## 2014-12-11 ENCOUNTER — Telehealth: Payer: Self-pay | Admitting: Oncology

## 2014-12-11 NOTE — Telephone Encounter (Signed)
Called [place] and informed her of the good results on her pap smear.  Veronica Ward verbalized agreement and understanding.

## 2014-12-14 ENCOUNTER — Encounter: Payer: Self-pay | Admitting: General Practice

## 2014-12-14 NOTE — Progress Notes (Signed)
Spiritual Care Note  Introduced by Polo Riley, LCSW because pt was exploring existential questions as part of reorientation/finding her new normal after cancer.  Met with Veronica Ward for an hour, providing reflective listening, normalization of feelings, and witness to her story.  Veronica Ward has professional and personal interest in literature, valuing narrative approaches to support and meaning-making.  Assisted with her reflection by providing spiritual direction questions, particularly using image and metaphor (familiar literary devices).  She is struggling with survivor's (thriver's) guilt, as well as finding her sense of purpose and motivation after her singleness of focus during treatment.  Also provided emotional support as she processed questions about supporting her mother through her own existential questions and possibly fears (her mom lives in an assisted-living facility in [place] and has hx mini-strokes/dementia with periods of lucidity and deep connection).  We plan to meet again on a Monday morning; pt plans to call to choose date. Plan to share referral resources for Jewish community support ([place] here in [place] and possibly in [place]) at that time.  We may also explore possible titles for a future book discussion group that may appeal to other people in a reorientation/FYNN-type question place, seeking to invite community and to speak to emotional needs beyond treatment.    Please also page as needs arise.  Thank you.  [place], [place]

## 2015-01-07 ENCOUNTER — Ambulatory Visit: Payer: Medicare Other | Admitting: Psychology

## 2015-01-21 ENCOUNTER — Ambulatory Visit (INDEPENDENT_AMBULATORY_CARE_PROVIDER_SITE_OTHER): Payer: 59 | Admitting: Psychology

## 2015-01-21 DIAGNOSIS — F4323 Adjustment disorder with mixed anxiety and depressed mood: Secondary | ICD-10-CM | POA: Diagnosis not present

## 2015-02-04 ENCOUNTER — Ambulatory Visit (INDEPENDENT_AMBULATORY_CARE_PROVIDER_SITE_OTHER): Payer: 59 | Admitting: Psychology

## 2015-02-04 DIAGNOSIS — F4323 Adjustment disorder with mixed anxiety and depressed mood: Secondary | ICD-10-CM | POA: Diagnosis not present

## 2015-02-08 ENCOUNTER — Ambulatory Visit (INDEPENDENT_AMBULATORY_CARE_PROVIDER_SITE_OTHER): Payer: Medicare Other | Admitting: Family Medicine

## 2015-02-08 ENCOUNTER — Encounter: Payer: Self-pay | Admitting: Family Medicine

## 2015-02-08 VITALS — BP 118/70 | HR 78 | Temp 98.6°F | Ht 63.0 in | Wt 104.0 lb

## 2015-02-08 DIAGNOSIS — M7582 Other shoulder lesions, left shoulder: Secondary | ICD-10-CM | POA: Diagnosis not present

## 2015-02-08 DIAGNOSIS — M7502 Adhesive capsulitis of left shoulder: Secondary | ICD-10-CM | POA: Diagnosis not present

## 2015-02-08 DIAGNOSIS — G5622 Lesion of ulnar nerve, left upper limb: Secondary | ICD-10-CM

## 2015-02-08 MED ORDER — METHYLPREDNISOLONE ACETATE 40 MG/ML IJ SUSP
80.0000 mg | Freq: Once | INTRAMUSCULAR | Status: AC
  Administered 2015-02-08: 80 mg via INTRA_ARTICULAR

## 2015-02-08 NOTE — Progress Notes (Signed)
Dr. Frederico Hamman T. Wells Gerdeman, MD, [facility] [place], [address] Phone: [phone] Fax: [phone]  02/08/2015  Patient: Veronica Ward, MRN: [id number], DOB: 12-Aug-1945, 69 y.o.  Primary Physician:  [name], MD  Chief Complaint: Follow-up  Subjective:   Veronica Ward is a 69 y.o. 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  D/w trey at PT and there are some times when it will throb.  Other times when suddenly will hurt "or something inside arm is broken" Generally, her motion is improved, but she is still having times of she is having a deep dull ache in a T-shirt distribution.  Overall, her pain is better, but she is still is having some symptoms, particularly with terminal pain.  Notably, her internal range of motion is bothering her some.  Now she is having times where her forearm is throbbing and nauseous.  Sometimes hurt in the arm and in the forearm.  She will occasionally have some problems distally in her forearm. She on my questioning tells me that she does have some issues with sleeping with her forearm and arm in flexion.  L intraartic shoulder inj  12/07/2014 Last OV with Owens Loffler, MD  Consulting Physician: Dr. Glori Bickers  My partner asked me to evaluate the patient for left-sided shoulder pain.  The patient reports a history where she obtained a Prevnar 13 vaccine on September 18, 2014.  She states that she received this vaccine in the location around the inferior aspect of her deltoid or just below this per her report. She tells me that she had a topical reaction after the injection, and she developed pain in this region after her injection at that time.  She denies having had any sort of problem or pain in that region prior to the date of this injection.  09/18/2014 Prevnar  vaccine.  Subsequently, after this she did develop some pain around this region locally, and she also felt as if she had some difference in her strength.  She does relate an event where she was at [employer], and she picked up a prepackaged supply of watermelon with 3 or 4 slices of watermelon, and she was unable to hold this and subsequently dropped it. She states that she had other instances as well, and has felt like her strength has not improved, however recently, she has not had such a problem.  Her symptoms have changed over time. She saw Dr. Glori Bickers on 09/29/2014, and she seems to indicate that the patient had subacromial bursitis and RTC tendinopathy at that time. She was placed on RICE and mobic, had continued symptoms and followed up with me today.  t this point her pain has been throughout the left upper extremity and concentrated in the shoulder.  She has a throbbing type pain per her description that is all over the arm and seems to be in a T-shirt distribution.  She has pain at nighttime, and she also has pain in abduction.  She has no significant history of shoulder trauma, fracture, dislocation, or operative intervention.  RTC and secondary frozen shoulder.   Past Medical History, Surgical History, Social History, Family History, Problem List, Medications, and Allergies have been reviewed and updated if relevant.  GEN: No fevers, chills. Nontoxic. Primarily MSK c/o today. MSK: Detailed in  the HPI GI: tolerating PO intake without difficulty Neuro: as above Otherwise the pertinent positives of the ROS are noted above.   Objective:   BP 118/70 mmHg  Pulse 78  Temp(Src) 98.6 F (37 C) (Oral)  Ht 5\' 3"  (1.6 m)  Wt 104 lb (47.174 kg)  BMI 18.43 kg/m2  LMP 09/17/2000   GEN: WDWN, NAD, Non-toxic, Alert & Oriented x 3 HEENT: Atraumatic, Normocephalic.  Ears and Nose: No external deformity. EXTR: No clubbing/cyanosis/edema NEURO: Normal gait.  PSYCH: Normally  interactive. Conversant. Not depressed or anxious appearing.  Calm demeanor.    Cervical spine: Full range of motion.  No inducible pain with motion at cervical spine.  Posterior paracervical musculature is normal and not tender to palpation.  Minimal tenderness in the upper trapezius musculature.  Spurling's examination is negative.  Left shoulder: Nontender along the clavicle.  Nontender at the acromioclavicular joint.  There is minimal to no tenderness in the bicipital groove and in the subacromial bursa.  Abduction, strength is 5/5.  Terminal motion lacks 5-7 compared to the contralateral side. Flexion, strength is 5/5.  Terminal motion full Obtained in 90 of abduction. Internal range of motion, strength is 5/5.  Terminal motion lacks approximately 40 compared to the contralateral side. External range of motion, strength is 5/5.  Terminal motion lacks approximately 10 compared to the contralateral side.  Strength testing at the elbow with flexion and extension is 5/5. Grip testing is 5/5. Flexion and extension at the wrist is 5/5.  Crossover testing is minimally positive. Neer testing is minimally positive. Michel Bickers test is minimally positive.  C5-T1 appear fully intact on exam today  Full flexion and extension at the elbow.  Full pronation and supination. Tinel's at the elbow is positive.  This is on the left. When I palpate the ulnar nerve more proximally, this is where the patient notes that she is having some pain in her arm, and he can elicit some symptoms more distally as well.  Radiology: EXAM: LEFT SHOULDER - 2+ VIEW  COMPARISON: None.  FINDINGS: There is no evidence of fracture or dislocation. There is no evidence of arthropathy or other focal bone abnormality. Soft tissues are unremarkable.  IMPRESSION: Negative.   Electronically Signed  By: Rolm Baptise M.D.  On: 09/30/2014 08:02  Assessment and Plan:   Adhesive capsulitis, left - Plan:  methylPREDNISolone acetate (DEPO-MEDROL) injection 80 mg  Rotator cuff tendonitis, left - Plan: methylPREDNISolone acetate (DEPO-MEDROL) injection 80 mg  Cubital tunnel syndrome, left  Impingement symptoms are much improved compared to prior examination.  Range of motion is much improved.  She still does not have full range of motion, and I think that most of her pain is coming from her adhesive capsulitis.  We are going to do an intra-articular shoulder injection today, and I gave her some additional stretches to do from [facility] protocol.  Intrarticular Shoulder Injection, LEFT Verbal consent was obtained from the patient. Risks including infection explained and contrasted with benefits and alternatives. Patient prepped with Chloraprep and Ethyl Chloride used for anesthesia. An intraarticular shoulder injection was performed using the posterior approach. The patient tolerated the procedure well and had decreased pain post injection. No complications. Injection: 8 cc of Lidocaine 1% and Depo-Medrol 80 mg. Needle: 22 gauge   Cubital tunnel syndrome or ulnar neuropathy is an entrapment neuropathy of the ulnar nerve at the elbow. This often occurs from repetitive trauma to this area from resting the elbow during occupational situations. Sometimes sleeping in  flexion at the elbow can induce this as well. I reviewed the relevant anatomy and gave the patient a handout on this condition, which reviews recommendations, basic nerve gliding and conservative care.   I also recommended either getting a ulnar neuropathy brace for nighttime or to wrap the elbow with towels and then to use tape to keep it in relative extension.   Follow-up: Return in about 2 months (around 04/11/2015). if not making progress  New Prescriptions   No medications on file   No orders of the defined types were placed in this encounter.    Signed,  Maud Deed. Island Dohmen, MD   Patient's Medications  New Prescriptions   No  medications on file  Previous Medications   ALENDRONATE (FOSAMAX) 70 MG TABLET    TAKE 1 TABLET BY MOUTH 30 MINUTES BEFOREBREAKFAST ONCE A WEEK WITH ATLEAST 8 OZ. OF WATER.   ASCORBIC ACID (VITAMIN C) 100 MG TABLET    Take 100 mg by mouth daily.   CALCIUM PO    Take 1,000 mg by mouth daily.   CHOLECALCIFEROL (VITAMIN D) 400 UNITS TABS    Take by mouth.   CO-ENZYME Q-10 30 MG CAPSULE    Take 30 mg by mouth 3 (three) times daily.   FISH OIL-OMEGA-3 FATTY ACIDS 1000 MG CAPSULE    Take by mouth 2 (two) times daily.    FOLIC ACID (FOLVITE) 1 MG TABLET    Take 1 mg by mouth daily.   IBUPROFEN (ADVIL,MOTRIN) 200 MG TABLET    Take 200 mg by mouth as needed for pain.   LACTOBACILLUS (CVS PROBIOTIC ACIDOPHILUS) 10 MG CAPS    Take by mouth.   LORATADINE (CLARITIN) 10 MG TABLET    Take 10 mg by mouth daily as needed for allergies.   MAGNESIUM 30 MG TABLET    Take 30 mg by mouth daily.   MULTIPLE VITAMIN (MULTIVITAMIN) TABLET    Take 1 tablet by mouth daily.   PYRIDOXINE (VITAMIN B-6) 100 MG TABLET    Take 100 mg by mouth daily.   VITAMIN B-12 (CYANOCOBALAMIN) 100 MCG TABLET    Take 100 mcg by mouth daily.  Modified Medications   No medications on file  Discontinued Medications   No medications on file

## 2015-02-08 NOTE — Progress Notes (Signed)
Pre visit review using our clinic review tool, if applicable. No additional management support is needed unless otherwise documented below in the visit note. 

## 2015-02-16 ENCOUNTER — Ambulatory Visit (INDEPENDENT_AMBULATORY_CARE_PROVIDER_SITE_OTHER): Payer: 59 | Admitting: Psychology

## 2015-02-16 DIAGNOSIS — F4323 Adjustment disorder with mixed anxiety and depressed mood: Secondary | ICD-10-CM

## 2015-03-05 ENCOUNTER — Ambulatory Visit: Payer: Medicare Other | Attending: Gynecology | Admitting: Gynecology

## 2015-03-05 ENCOUNTER — Encounter: Payer: Self-pay | Admitting: Gynecology

## 2015-03-05 ENCOUNTER — Other Ambulatory Visit (HOSPITAL_COMMUNITY)
Admission: RE | Admit: 2015-03-05 | Discharge: 2015-03-05 | Disposition: A | Discharge status: Home / Self Care | Payer: Medicare Other | Source: Ambulatory Visit | Attending: Gynecology | Admitting: Gynecology

## 2015-03-05 VITALS — BP 112/63 | HR 76 | Temp 98.9°F | Resp 18 | Ht 63.0 in | Wt 103.5 lb

## 2015-03-05 DIAGNOSIS — C541 Malignant neoplasm of endometrium: Secondary | ICD-10-CM | POA: Diagnosis not present

## 2015-03-05 DIAGNOSIS — Z01411 Encounter for gynecological examination (general) (routine) with abnormal findings: Secondary | ICD-10-CM | POA: Insufficient documentation

## 2015-03-05 NOTE — Patient Instructions (Signed)
We will notify you of the results of your pap smear from today.  Plan to follow up in six months or sooner if needed.  Please call for any questions or concerns.

## 2015-03-05 NOTE — Progress Notes (Signed)
Consult Note: Gyn-Onc   LAPARIS DURRETT 69 y.o. female  Chief Complaint  Patient presents with  . endometrial cancer    follow-up    Assessment : Endometrial carcinoma (papillary serous) stage I a (March 2015). Status post 6 cycles of carboplatin Taxol chemotherapy and vaginal vault brachytherapy. Patient's clinically free of disease.  Plan:  She will see Dr, Sondra Come in 3 months. I plan on seeing the patient back in 6 months. Pap smears are obtained today she will continue to have annual mammograms.  Interval history: The patient returns today for continuing follow-up of endometrial carcinoma. Since her last visit with gynecologic oncology she's been seen by Dr. Sondra Come. Today we she reports she's having no problems and her health is been good. She specifically denies any GI or GU symptoms no pelvic pain pressure vaginal bleeding or discharge.  HPI: 69 year old white female seen in consultation request of Dr. Mora Bellman regarding management of a newly diagnosed endometrial cancer. Approximately 3 weeks ago the patient developed some vaginal spotting. She was seen by Dr. Elly Modena on February 25. An endometrial biopsy was obtained showing a grade 2 endometrial carcinoma. The patient is also had an ultrasound of the pelvis revealing a uterus measuring 7 x 3 x 3.7 cm. The endometrial stripe was 7 mm.  [name] She has no other gynecologic history.   Patient underwent robotic hysterectomy bilateral salpingo-oophorectomy and pelvic lymphadenectomy at [facility] on 10/03/2013. 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 Adjuvant therapy with carboplatin and Taxol and vaginal vault brachytherapy was advised. The patient had a uncomplicated postoperative course.    Review of Systems:10 point review of systems  is negative except as noted in interval history.   Vitals: Blood pressure 112/63, pulse 76, temperature 98.9 F (37.2 C), temperature source Oral, resp. rate 18, height '5\' 3"'  (1.6 m), weight 103 lb 8 oz (46.947 kg), last menstrual period 09/17/2000, SpO2 99 %.  Physical Exam: General : The patient is a healthy woman in no acute distress.  HEENT: normocephalic, extraoccular movements normal; neck is supple without thyromegally  Lynphnodes: Supraclavicular and inguinal nodes not enlarged  Abdomen: Soft, non-tender, no ascites, no organomegally, no masses, no hernias  Pelvic:  EGBUS: Normal female  Vagina: Normal, no lesions, atrophic , the cuff closure is intact and is healing appropriately. Urethra and Bladder: Normal, non-tender  Cervix: Surgically absent Uterus: Surgically absent Bi-manual examination: Non-tender; no adenxal masses or nodularity  Rectal: normal sphincter tone, no masses, no blood  Lower extremities: No edema or varicosities. Normal range of motion      Allergies  Allergen Reactions  . Bactrim [Sulfamethoxazole-Trimethoprim] Rash  . Oxycodone-Acetaminophen Anxiety  . Sulfa Antibiotics Rash    Past Medical History  Diagnosis Date  . Hx of basal cell carcinoma     face  . Diffuse cystic mastopathy   . Headache(784.0)   . Unspecified hemorrhoids without mention of complication   . Other malaise and fatigue   . Disorder of bone and cartilage, unspecified   . Other psoriasis   . Other seborrheic keratosis   . Sleep disturbance, unspecified   . Predominant disturbance of emotions   . Pain in limb   . Urticaria, unspecified   . History of shingles   . Anxiety   . Anemia   .  Dysautonomia     mild  . Osteoporosis   . Endometrial cancer   . Ashkenazi Jewish ancestry   . Radiation 01/06/14, 12/23/13, 12/16/13, 12/09/13    HDR brachytherapy    Past Surgical History  Procedure Laterality Date  . Cervical conization w/bx    . Cervical cancer  1973  . Breast  biopsy Left 2001    benign  . Tubal ligation    . Dexa  03/2002    oseopenia  . Colonoscopy  12/04    int. hemorrhoids  . Dexa  07/2004    decreased BMD, osteopenia  . Dexa  02/2007    Osteopenia  . Breast lumpectomy      left underarm  . Birthmark removal  childhood  . Dexa  9/10    Osteopenia slightly worse  . Felon i&d  12/2010    Dr. Fredna Dow, I&D in OR (L index finger)  . Finger surgery    . Knee surgery      right  . Robotic assisted total hysterectomy with bilateral salpingo oopherectomy  10/03/2013    Robotic-assisted total laparoscopic hysterectomy, bilateral salpingo-oophorectomy, bilateral pelvic and para-aortic lymphadenectomy with sentinel lymph node dissection  . Tonsilectomy, adenoidectomy, bilateral myringotomy and tubes    . Axillary lymph node dissection      left    Current Outpatient Prescriptions  Medication Sig Dispense Refill  . alendronate (FOSAMAX) 70 MG tablet TAKE 1 TABLET BY MOUTH 30 MINUTES BEFOREBREAKFAST ONCE A WEEK WITH ATLEAST 8 OZ. OF WATER. 4 tablet 11  . Ascorbic Acid (VITAMIN C) 100 MG tablet Take 100 mg by mouth daily.    Marland Kitchen CALCIUM PO Take 1,000 mg by mouth daily.    . cholecalciferol (VITAMIN D) 400 UNITS TABS Take 400 Units by mouth daily.     Marland Kitchen co-enzyme Q-10 30 MG capsule Take 30 mg by mouth 3 (three) times daily.    . fish oil-omega-3 fatty acids 1000 MG capsule Take by mouth 2 (two) times daily.     . folic acid (FOLVITE) 1 MG tablet Take 1 mg by mouth daily.    Marland Kitchen ibuprofen (ADVIL,MOTRIN) 200 MG tablet Take 200 mg by mouth as needed for pain.    . Lactobacillus (CVS PROBIOTIC ACIDOPHILUS) 10 MG CAPS Take 1 capsule by mouth daily.     Marland Kitchen loratadine (CLARITIN) 10 MG tablet Take 10 mg by mouth daily as needed for allergies.    . magnesium 30 MG tablet Take 30 mg by mouth daily.    . Multiple Vitamin (MULTIVITAMIN) tablet Take 1 tablet by mouth daily.    Marland Kitchen pyridOXINE (VITAMIN B-6) 100 MG tablet Take 100 mg by mouth daily.    . vitamin B-12  (CYANOCOBALAMIN) 100 MCG tablet Take 100 mcg by mouth daily.     No current facility-administered medications for this visit.    Social History   Social History  . Marital Status: Married    Spouse Name: N/A  . Number of Children: 3  . Years of Education: N/A   Occupational History  . Professor    Social History Main Topics  . Smoking status: Former Smoker    Quit date: 07/25/1963  . Smokeless tobacco: Never Used  . Alcohol Use: 0.6 oz/week    0 Standard drinks or equivalent, 1 Glasses of wine per week     Comment: 1/2 glass of wine a day  . Drug Use: No  . Sexual Activity: Yes    Birth Control/ Protection: Post-menopausal  Other Topics Concern  . Not on file   Social History Narrative   History professor      Likes to dance      Is a walker and does floor exercises      Caffeine: 2 cups daily    Family History  Problem Relation Age of Onset  . Multiple myeloma Father   . Coronary artery disease Father   . Transient ischemic attack Mother   . Lupus Maternal Aunt   . Breast cancer Maternal Aunt     dx 27s; deceased  . Cancer Maternal Grandfather     GI cancer or pancreatic; deceased 38  . Colon cancer Neg Hx   . Ovarian cancer Maternal Aunt     dx 9s; deceased 48s  . Multiple sclerosis Brother       Alvino Chapel, MD 03/05/2015, 11:57 AM         Consult Note: Gyn-Onc   Buena Irish 69 y.o. female  Chief Complaint  Patient presents with  . endometrial cancer    follow-up    Assessment : Endometrial carcinoma (papillary serous) stage I a (March 2015). Status post 6 cycles of carboplatin Taxol chemotherapy and vaginal vault brachytherapy. Patient's clinically free of disease.  Plan:  She will see Dr. Marko Plume and Sondra Come as scheduled. I plan on seeing the patient back in August 2016. Pap smears are obtained today  Interval history: The patient returns today for continuing follow-up of endometrial carcinoma. Since her last visit  with gynecologic oncology she's been seen by Dr. Marko Plume and Sondra Come. Today we she reports she's having no problems and her health is been good except for a GI virus 2 weeks ago which cleared spontaneously. She specifically denies any GI or GU symptoms no pelvic pain pressure vaginal bleeding or discharge.  HPI: 69 year old white female seen in consultation request of Dr. Mora Bellman regarding management of a newly diagnosed endometrial cancer. Approximately 3 weeks ago the patient developed some vaginal spotting. She was seen by Dr. Elly Modena on February 25. An endometrial biopsy was obtained showing a grade 2 endometrial carcinoma. The patient is also had an ultrasound of the pelvis revealing a uterus measuring 7 x 3 x 3.7 cm. The endometrial stripe was 7 mm.  [name] She has no other gynecologic history.   Patient underwent robotic hysterectomy bilateral salpingo-oophorectomy and pelvic lymphadenectomy at [facility] on 10/03/2013. 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 Adjuvant therapy with carboplatin and Taxol and vaginal vault brachytherapy was advised. The patient had a uncomplicated postoperative course.    Review of Systems:10 point review of systems is negative except as noted in interval history.   Vitals: Blood pressure 112/63, pulse 76, temperature 98.9 F (37.2 C), temperature source Oral, resp. rate 18, height '5\' 3"'  (1.6 m), weight 103 lb 8 oz (46.947 kg), last menstrual period 09/17/2000, SpO2 99 %.  Physical Exam: General : The patient is a healthy woman in no acute distress.  HEENT: normocephalic, extraoccular movements normal; neck is supple without thyromegally  Lynphnodes: Supraclavicular and inguinal nodes not enlarged  Abdomen: Soft, non-tender, no ascites, no organomegally, no  masses, no hernias  Pelvic:  EGBUS: Normal female  Vagina: Normal, no lesions, atrophic , the cuff closure is intact and is healing appropriately. Urethra and Bladder:  Normal, non-tender  Cervix: Surgically absent Uterus: Surgically absent Bi-manual examination: Non-tender; no adenxal masses or nodularity  Rectal: normal sphincter tone, no masses, no blood  Lower extremities: No edema or varicosities. Normal range of motion      Allergies  Allergen Reactions  . Bactrim [Sulfamethoxazole-Trimethoprim] Rash  . Oxycodone-Acetaminophen Anxiety  . Sulfa Antibiotics Rash    Past Medical History  Diagnosis Date  . Hx of basal cell carcinoma     face  . Diffuse cystic mastopathy   . Headache(784.0)   . Unspecified hemorrhoids without mention of complication   . Other malaise and fatigue   . Disorder of bone and cartilage, unspecified   . Other psoriasis   . Other seborrheic keratosis   . Sleep disturbance, unspecified   . Predominant disturbance of emotions   . Pain in limb   . Urticaria, unspecified   . History of shingles   . Anxiety   . Anemia   . Dysautonomia     mild  . Osteoporosis   . Endometrial cancer   . Ashkenazi Jewish ancestry   . Radiation 01/06/14, 12/23/13, 12/16/13, 12/09/13    HDR brachytherapy    Past Surgical History  Procedure Laterality Date  . Cervical conization w/bx    . Cervical cancer  1973  . Breast biopsy Left 2001    benign  . Tubal ligation    . Dexa  03/2002    oseopenia  . Colonoscopy  12/04    int. hemorrhoids  . Dexa  07/2004    decreased BMD, osteopenia  . Dexa  02/2007    Osteopenia  . Breast lumpectomy      left underarm  . Birthmark removal  childhood  . Dexa  9/10    Osteopenia slightly worse  . Felon i&d  12/2010    Dr. Fredna Dow, I&D in OR (L index finger)  . Finger surgery    . Knee surgery      right  . Robotic assisted total hysterectomy with bilateral salpingo oopherectomy  10/03/2013    Robotic-assisted total  laparoscopic hysterectomy, bilateral salpingo-oophorectomy, bilateral pelvic and para-aortic lymphadenectomy with sentinel lymph node dissection  . Tonsilectomy, adenoidectomy, bilateral myringotomy and tubes    . Axillary lymph node dissection      left    Current Outpatient Prescriptions  Medication Sig Dispense Refill  . alendronate (FOSAMAX) 70 MG tablet TAKE 1 TABLET BY MOUTH 30 MINUTES BEFOREBREAKFAST ONCE A WEEK WITH ATLEAST 8 OZ. OF WATER. 4 tablet 11  . Ascorbic Acid (VITAMIN C) 100 MG tablet Take 100 mg by mouth daily.    Marland Kitchen CALCIUM PO Take 1,000 mg by mouth daily.    . cholecalciferol (VITAMIN D) 400 UNITS TABS Take 400 Units by mouth daily.     Marland Kitchen co-enzyme Q-10 30 MG capsule Take 30 mg by mouth 3 (three) times daily.    . fish oil-omega-3 fatty acids 1000 MG capsule Take by mouth 2 (two) times daily.     . folic acid (FOLVITE) 1 MG tablet Take 1 mg by mouth daily.    Marland Kitchen ibuprofen (ADVIL,MOTRIN) 200 MG tablet Take 200 mg by mouth as needed for pain.    . Lactobacillus (CVS PROBIOTIC ACIDOPHILUS) 10 MG CAPS Take 1 capsule by mouth daily.     Marland Kitchen loratadine (CLARITIN) 10 MG tablet Take 10 mg by mouth daily as needed for allergies.    . magnesium 30 MG tablet Take 30 mg by mouth daily.    Marland Kitchen  Multiple Vitamin (MULTIVITAMIN) tablet Take 1 tablet by mouth daily.    Marland Kitchen pyridOXINE (VITAMIN B-6) 100 MG tablet Take 100 mg by mouth daily.    . vitamin B-12 (CYANOCOBALAMIN) 100 MCG tablet Take 100 mcg by mouth daily.     No current facility-administered medications for this visit.    Social History   Social History  . Marital Status: Married    Spouse Name: N/A  . Number of Children: 3  . Years of Education: N/A   Occupational History  . Professor    Social History Main Topics  . Smoking status: Former Smoker    Quit date: 07/25/1963  . Smokeless tobacco: Never Used  . Alcohol Use: 0.6 oz/week    0 Standard drinks or equivalent, 1 Glasses of wine per week     Comment: 1/2 glass of  wine a day  . Drug Use: No  . Sexual Activity: Yes    Birth Control/ Protection: Post-menopausal   Other Topics Concern  . Not on file   Social History Narrative   History professor      Likes to dance      Is a walker and does floor exercises      Caffeine: 2 cups daily    Family History  Problem Relation Age of Onset  . Multiple myeloma Father   . Coronary artery disease Father   . Transient ischemic attack Mother   . Lupus Maternal Aunt   . Breast cancer Maternal Aunt     dx 10s; deceased  . Cancer Maternal Grandfather     GI cancer or pancreatic; deceased 71  . Colon cancer Neg Hx   . Ovarian cancer Maternal Aunt     dx 8s; deceased 62s  . Multiple sclerosis Brother       Alvino Chapel, MD 03/05/2015, 12:07 PM

## 2015-03-05 NOTE — Addendum Note (Signed)
Addended by: Joylene John D on: 03/05/2015 02:52 PM   Modules accepted: Orders

## 2015-03-09 LAB — CYTOLOGY - PAP

## 2015-03-10 ENCOUNTER — Telehealth: Payer: Self-pay | Admitting: *Deleted

## 2015-03-10 ENCOUNTER — Ambulatory Visit (INDEPENDENT_AMBULATORY_CARE_PROVIDER_SITE_OTHER): Payer: 59 | Admitting: Psychology

## 2015-03-10 DIAGNOSIS — F4323 Adjustment disorder with mixed anxiety and depressed mood: Secondary | ICD-10-CM | POA: Diagnosis not present

## 2015-03-10 NOTE — Telephone Encounter (Signed)
Called to notify patient of results as noted below by Joylene John, NP. Patient not at home currently - gave results to her husband, [name] (signed [facility] 10/2013). He is agreeable to pass information along to patient and instructed him to have patient call our office with any questions or concerns.

## 2015-03-10 NOTE — Telephone Encounter (Signed)
-----   Message from Dorothyann Gibbs, NP sent at 03/10/2015 11:07 AM EDT ----- Negative pap smear ----- Message -----    From: [facility] Interface    Sent: 03/09/2015   5:25 PM      To: Dorothyann Gibbs, NP

## 2015-03-31 ENCOUNTER — Ambulatory Visit (INDEPENDENT_AMBULATORY_CARE_PROVIDER_SITE_OTHER): Payer: 59 | Admitting: Psychology

## 2015-03-31 DIAGNOSIS — F4323 Adjustment disorder with mixed anxiety and depressed mood: Secondary | ICD-10-CM

## 2015-04-09 ENCOUNTER — Ambulatory Visit (INDEPENDENT_AMBULATORY_CARE_PROVIDER_SITE_OTHER): Payer: Medicare Other

## 2015-04-09 DIAGNOSIS — Z23 Encounter for immunization: Secondary | ICD-10-CM | POA: Diagnosis not present

## 2015-04-14 ENCOUNTER — Ambulatory Visit (INDEPENDENT_AMBULATORY_CARE_PROVIDER_SITE_OTHER): Payer: 59 | Admitting: Psychology

## 2015-04-14 DIAGNOSIS — F4323 Adjustment disorder with mixed anxiety and depressed mood: Secondary | ICD-10-CM | POA: Diagnosis not present

## 2015-04-26 ENCOUNTER — Telehealth: Payer: Self-pay
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
## 2015-05-12 ENCOUNTER — Ambulatory Visit (INDEPENDENT_AMBULATORY_CARE_PROVIDER_SITE_OTHER): Payer: 59 | Admitting: Psychology

## 2015-05-12 DIAGNOSIS — F4323 Adjustment disorder with mixed anxiety and depressed mood: Secondary | ICD-10-CM

## 2015-06-07 ENCOUNTER — Other Ambulatory Visit: Payer: Self-pay

## 2015-06-07 DIAGNOSIS — Z1231 Encounter for screening mammogram for malignant neoplasm of breast: Secondary | ICD-10-CM

## 2015-06-10 ENCOUNTER — Ambulatory Visit
Admission: RE | Admit: 2015-06-10 | Discharge: 2015-06-10 | Disposition: A | Discharge status: Home / Self Care | Payer: Medicare Other | Source: Ambulatory Visit | Attending: Radiation Oncology | Admitting: Radiation Oncology

## 2015-06-10 VITALS — BP 104/61 | HR 70 | Temp 98.3°F | Resp 18 | Ht 63.0 in | Wt 107.6 lb

## 2015-06-10 DIAGNOSIS — C541 Malignant neoplasm of endometrium: Secondary | ICD-10-CM

## 2015-06-10 NOTE — Progress Notes (Signed)
Veronica Ward here for follow up.  She denies pain, bladder issues, bowel issues and vaginal/rectal bleeding or discharge.  She denies fatigue and has a good appetite. She reports using her vaginal dilator occasionally and is sexually active.  BP 104/61 mmHg  Pulse 70  Temp(Src) 98.3 F (36.8 C) (Oral)  Resp 18  Ht 5\' 3"  (1.6 m)  Wt 107 lb 9.6 oz (48.807 kg)  BMI 19.07 kg/m2  SpO2 100%  LMP 09/17/2000   Wt Readings from Last 3 Encounters:  06/10/15 107 lb 9.6 oz (48.807 kg)  03/05/15 103 lb 8 oz (46.947 kg)  02/08/15 104 lb (47.174 kg)

## 2015-06-10 NOTE — Progress Notes (Signed)
Radiation Oncology         [phone] ________________________________  Name: Veronica Ward MRN: [id number]  Date: 06/10/2015  DOB: March 27, 1946  Follow-Up Visit Note  CC: Veronica Pardon, MD  Veronica Ward,*   Diagnosis:  Endometrial  cancer, serous, stage IA  Interval Since Last Radiation: May 19, May 26th, June 2, June 9, January 06, 2014  Site/dose:   Proximal vagina, 30 gray in 5 fractions, 4.0 cm treatment length, vaginal brachytherapy  Narrative:  The patient returns today for routine follow-up. She denies pain, bladder issues, bowel issues, or vaginal/rectal bleeding or discharge. She denies fatigue and has a good appetite. She reports using her vaginal dilator occasionally and is sexually active.  ALLERGIES:  is allergic to bactrim; benadryl; oxycodone-acetaminophen; and sulfa antibiotics.  Meds: Current Outpatient Prescriptions  Medication Sig Dispense Refill  . alendronate (FOSAMAX) 70 MG tablet TAKE 1 TABLET BY MOUTH 30 MINUTES BEFOREBREAKFAST ONCE A WEEK WITH ATLEAST 8 OZ. OF WATER. 4 tablet 11  . CALCIUM PO Take 1,000 mg by mouth daily.    . cholecalciferol (VITAMIN D) 400 UNITS TABS Take 400 Units by mouth daily.     Marland Kitchen co-enzyme Q-10 30 MG capsule Take 30 mg by mouth 3 (three) times daily.    . fish oil-omega-3 fatty acids 1000 MG capsule Take by mouth 2 (two) times daily.     . folic acid (FOLVITE) 1 MG tablet Take 1 mg by mouth daily.    Marland Kitchen ibuprofen (ADVIL,MOTRIN) 200 MG tablet Take 200 mg by mouth as needed for pain.    . Lactobacillus (CVS PROBIOTIC ACIDOPHILUS) 10 MG CAPS Take 1 capsule by mouth daily.     . magnesium 30 MG tablet Take 30 mg by mouth daily.    . Multiple Vitamin (MULTIVITAMIN) tablet Take 1 tablet by mouth daily.    Marland Kitchen pyridOXINE (VITAMIN B-6) 100 MG tablet Take 100 mg by mouth daily.    . vitamin B-12 (CYANOCOBALAMIN) 100 MCG tablet Take 100 mcg by mouth daily.    . Ascorbic Acid (VITAMIN C) 100 MG tablet Take 100 mg by mouth daily.      Marland Kitchen loratadine (CLARITIN) 10 MG tablet Take 10 mg by mouth daily as needed for allergies.     No current facility-administered medications for this encounter.    Physical Findings: The patient is in no acute distress. Patient is alert and oriented.  height is 5\' 3"  (1.6 m) and weight is 107 lb 9.6 oz (48.807 kg). Her oral temperature is 98.3 F (36.8 C). Her blood pressure is 104/61 and her pulse is 70. Her respiration is 18 and oxygen saturation is 100%. . The lungs are clear to auscultation. The heart has regular rhythm and rate. The abdomen is soft nontender with normal bowel sounds. No inguinal adenopathy appreciated. On pelvic examination the external genitalia are unremarkable. A speculum exam is performed. There are no mucosal lesions noted in the vaginal vault. On bimanual and rectovaginal examination there no pelvic masses appreciated.  Lab Findings: Lab Results  Component Value Date   WBC 3.9* 09/10/2014   HGB 12.2 09/10/2014   HCT 35.9* 09/10/2014   MCV 95.9 09/10/2014   PLT 196.0 09/10/2014    Radiographic Findings: No results found.  Impression:  No evidence recurrence on clinical exam today.  Plan: She will see Dr. Fermin Schwab in 3 months and she should f/u with rad/onc in 6 months. ____________________________________  Veronica Promise, PhD, MD  This document serves as a  record of services personally performed by Gery Pray, MD. It was created on his behalf by Darcus Austin, a trained medical scribe. The creation of this record is based on the scribe's personal observations and the provider's statements to them. This document has been checked and approved by the attending provider.

## 2015-06-21 ENCOUNTER — Other Ambulatory Visit: Payer: Self-pay | Admitting: Family Medicine

## 2015-06-25 ENCOUNTER — Telehealth: Payer: Self-pay | Admitting: Family Medicine

## 2015-06-25 ENCOUNTER — Encounter: Payer: Self-pay | Admitting: Family Medicine

## 2015-06-25 ENCOUNTER — Ambulatory Visit (INDEPENDENT_AMBULATORY_CARE_PROVIDER_SITE_OTHER): Payer: Medicare Other | Admitting: Family Medicine

## 2015-06-25 VITALS — BP 118/68 | HR 78 | Temp 99.0°F | Ht 63.0 in | Wt 103.5 lb

## 2015-06-25 DIAGNOSIS — J209 Acute bronchitis, unspecified: Secondary | ICD-10-CM | POA: Insufficient documentation

## 2015-06-25 MED ORDER — AZITHROMYCIN 250 MG PO TABS: ORAL_TABLET | ORAL | Status: DC

## 2015-06-25 NOTE — Telephone Encounter (Signed)
Pt has appt 06/25/15 at 11:45 with Dr Glori Bickers.

## 2015-06-25 NOTE — Progress Notes (Signed)
Pre visit review using our clinic review tool, if applicable. No additional management support is needed unless otherwise documented below in the visit note. 

## 2015-06-25 NOTE — Progress Notes (Signed)
Subjective:    Patient ID: Veronica Ward, female    DOB: 1945/07/29, 69 y.o.   MRN: [id number]  HPI Here for uri symptoms   Started on Monday -tired and cough and fever and sore throat (99.0- no higher)  Symptoms continued through the week  Wants to lie down and rest all the time  Wakes up with headaches = takes tylenol   Temp is 99 today   Coughed all morning and her cough became productive of green phlegm  Hoarse on and off  Throat is not as sore as it was but not scratchy    Tried some expectorant cough syrup    Drinking lots of tea and water   Exp to a lot of family with illness last week -whole family is sick   Patient Active Problem List   Diagnosis Date Noted  . Left shoulder pain 09/29/2014  . Ashkenazi Jewish ancestry   . Endometrial cancer ([place]) 11/03/2013  . CIS (carcinoma in situ of cervix) 09/23/2013  . Encounter for Medicare annual wellness exam 03/12/2013  . Colon cancer screening 03/12/2013  . Routine general medical examination at a health care facility 03/03/2013  . Encounter for medication monitoring 11/23/2010  . SEBORRHEIC KERATOSIS 08/25/2009  . STRESS REACTION, ACUTE, WITH EMOTIONAL DISTURBANCE 12/09/2008  . SLEEP DISORDER 12/09/2008  . HEMORRHOIDS 10/12/2008  . Osteoporosis 10/08/2007  . BASAL CELL CARCINOMA, FACE 06/21/2007  . FIBROCYSTIC BREAST DISEASE 06/21/2007  . PSORIASIS 06/21/2007  . URTICARIA 06/21/2007  . BASAL CELL CARCINOMA, FACE 06/21/2007   Past Medical History  Diagnosis Date  . Hx of basal cell carcinoma     face  . Diffuse cystic mastopathy   . Headache(784.0)   . Unspecified hemorrhoids without mention of complication   . Other malaise and fatigue   . Disorder of bone and cartilage, unspecified   . Other psoriasis   . Other seborrheic keratosis   . Sleep disturbance, unspecified   . Predominant disturbance of emotions   . Pain in limb   . Urticaria, unspecified   . History of shingles   . Anxiety   . Anemia     . Dysautonomia     mild  . Osteoporosis   . Endometrial cancer ([place])   . Ashkenazi Jewish ancestry   . Radiation 01/06/14, 12/23/13, 12/16/13, 12/09/13    HDR brachytherapy   Past Surgical History  Procedure Laterality Date  . Cervical conization w/bx    . Cervical cancer  1973  . Breast biopsy Left 2001    benign  . Tubal ligation    . Dexa  03/2002    oseopenia  . Colonoscopy  12/04    int. hemorrhoids  . Dexa  07/2004    decreased BMD, osteopenia  . Dexa  02/2007    Osteopenia  . Breast lumpectomy      left underarm  . Birthmark removal  childhood  . Dexa  9/10    Osteopenia slightly worse  . Felon i&d  12/2010    Dr. Fredna Dow, I&D in OR (L index finger)  . Finger surgery    . Knee surgery      right  . Robotic assisted total hysterectomy with bilateral salpingo oopherectomy  10/03/2013    Robotic-assisted total laparoscopic hysterectomy, bilateral salpingo-oophorectomy, bilateral pelvic and para-aortic lymphadenectomy with sentinel lymph node dissection  . Tonsilectomy, adenoidectomy, bilateral myringotomy and tubes    . Axillary lymph node dissection      left   Social  History  Substance Use Topics  . Smoking status: Former Smoker    Quit date: 07/25/1963  . Smokeless tobacco: Never Used  . Alcohol Use: 0.6 oz/week    0 Standard drinks or equivalent, 1 Glasses of wine per week     Comment: 1/2 glass of wine a day   Family History  Problem Relation Age of Onset  . Multiple myeloma Father   . Coronary artery disease Father   . Transient ischemic attack Mother   . Lupus Maternal Aunt   . Breast cancer Maternal Aunt     dx 75s; deceased  . Cancer Maternal Grandfather     GI cancer or pancreatic; deceased 79  . Colon cancer Neg Hx   . Ovarian cancer Maternal Aunt     dx 76s; deceased 83s  . Multiple sclerosis Brother    Allergies  Allergen Reactions  . Bactrim [Sulfamethoxazole-Trimethoprim] Rash  . Benadryl [Diphenhydramine Hcl (Sleep)] Rash and Anxiety     Fever, body ache  . Oxycodone-Acetaminophen Anxiety  . Sulfa Antibiotics Rash   Current Outpatient Prescriptions on File Prior to Visit  Medication Sig Dispense Refill  . alendronate (FOSAMAX) 70 MG tablet TAKE 1 TABLET BY MOUTH 30 MINUTES BEFOREBREAKFAST ONCE A WEEK WITH ATLEAST 8 OZ. OF WATER. 4 tablet 0  . Ascorbic Acid (VITAMIN C) 100 MG tablet Take 100 mg by mouth daily.    Marland Kitchen CALCIUM PO Take 1,000 mg by mouth daily.    . cholecalciferol (VITAMIN D) 400 UNITS TABS Take 400 Units by mouth daily.     Marland Kitchen co-enzyme Q-10 30 MG capsule Take 30 mg by mouth 3 (three) times daily.    . fish oil-omega-3 fatty acids 1000 MG capsule Take by mouth 2 (two) times daily.     . folic acid (FOLVITE) 1 MG tablet Take 1 mg by mouth daily.    Marland Kitchen ibuprofen (ADVIL,MOTRIN) 200 MG tablet Take 200 mg by mouth as needed for pain.    . Lactobacillus (CVS PROBIOTIC ACIDOPHILUS) 10 MG CAPS Take 1 capsule by mouth daily.     . magnesium 30 MG tablet Take 30 mg by mouth daily.    . Multiple Vitamin (MULTIVITAMIN) tablet Take 1 tablet by mouth daily.    Marland Kitchen pyridOXINE (VITAMIN B-6) 100 MG tablet Take 100 mg by mouth daily.    . vitamin B-12 (CYANOCOBALAMIN) 100 MCG tablet Take 100 mcg by mouth daily.     No current facility-administered medications on file prior to visit.      Review of Systems Review of Systems  Constitutional: Negative for , appetite change, and unexpected weight change.  ENT pos for cong /rhinorrhea and neg for sinus pain  Eyes: Negative for pain and visual disturbance.  Respiratory: Negative for wheeze  and shortness of breath.   Cardiovascular: Negative for cp or palpitations    Gastrointestinal: Negative for nausea, diarrhea and constipation.  Genitourinary: Negative for urgency and frequency.  Skin: Negative for pallor or rash   Neurological: Negative for weakness, light-headedness, numbness and headaches.  Hematological: Negative for adenopathy. Does not bruise/bleed easily.    Psychiatric/Behavioral: Negative for dysphoric mood. The patient is not nervous/anxious.         Objective:   Physical Exam  Constitutional: She appears well-developed and well-nourished. No distress.  Well appearing but fatigued   HENT:  Head: Normocephalic and atraumatic.  Right Ear: External ear normal.  Left Ear: External ear normal.  Mouth/Throat: Oropharynx is clear and moist.  Nares  are injected and congested  No sinus tenderness Clear rhinorrhea and post nasal drip   Eyes: Conjunctivae and EOM are normal. Pupils are equal, round, and reactive to light. Right eye exhibits no discharge. Left eye exhibits no discharge.  Neck: Normal range of motion. Neck supple.  Cardiovascular: Normal rate and normal heart sounds.   Pulmonary/Chest: Effort normal and breath sounds normal. No respiratory distress. She has no wheezes. She has no rales. She exhibits no tenderness.  Harsh bs   Lymphadenopathy:    She has no cervical adenopathy.  Neurological: She is alert.  Skin: Skin is warm and dry. No rash noted.  Psychiatric: She has a normal mood and affect.  Nursing note and vitals reviewed.         Assessment & Plan:   Problem List Items Addressed This Visit      Respiratory   Acute bronchitis - Primary    Suspect viral Disc symptomatic care - see instructions on AVS  Fluids and rest  If worse over wkend - fill zpak and alert [place] next week   Update if not starting to improve in a week or if worsening

## 2015-06-25 NOTE — Telephone Encounter (Signed)
Pt was seen

## 2015-06-25 NOTE — Patient Instructions (Signed)
I think you have a viral respiratory infection/bronchitis Drink lots of fluids and rest  Tylenol for fever or pain  Expectorant cough medicine is fine  Hold px for zithromax and fill it if you feel worse over the weekend and keep [place] posted   Keep resting   Update if not starting to improve in a week or if worsening

## 2015-06-25 NOTE — Telephone Encounter (Signed)
Patient Name: Veronica Ward DOB: 15-Mar-1946 Initial Comment Caller states she is tired, no energy, losing her voice. Dry cough. Green phelm. Sore throat. Nurse Assessment Nurse: Ronnald Ramp, RN, Miranda Date/Time (Eastern Time): 06/25/2015 9:15:34 AM Confirm and document reason for call. If symptomatic, describe symptoms. ---Caller states she has had a dry cough since Monday. Today has coughed up green mucus. Has some minor nasal drainage. Also with sore throat that is improving, but has hoarseness. Has the patient traveled out of the country within the last 30 days? ---No Does the patient have any new or worsening symptoms? ---Yes Will a triage be completed? ---Yes Related visit to physician within the last 2 weeks? ---No Does the PT have any chronic conditions? (i.e. diabetes, asthma, etc.) ---No Is this a behavioral health or substance abuse call? ---No Guidelines Guideline Title Affirmed Question Affirmed Notes Cough - Acute Non-Productive SEVERE coughing spells (e.g., whooping sound after coughing, vomiting after coughing) Final Disposition User See Physician within 24 Hours Jones, RN, [employer] states she has an appt today at 11:45 am with [facility] Referrals REFERRED TO PCP OFFICE Disagree/Comply: Leta Baptist

## 2015-06-27 NOTE — Assessment & Plan Note (Signed)
Suspect viral Disc symptomatic care - see instructions on AVS  Fluids and rest  If worse over wkend - fill zpak and alert [place] next week   Update if not starting to improve in a week or if worsening

## 2015-07-02 ENCOUNTER — Telehealth: Payer: Self-pay | Admitting: Oncology

## 2015-07-02 NOTE — Telephone Encounter (Signed)
Veronica Ward asked if the results were back for the pap smear done at her last visit.  Advised her that a pap smear had not been collected as the guidelines for testing had changed.  [name] verbalized agreement and understanding.

## 2015-07-15 ENCOUNTER — Ambulatory Visit
Admission: RE | Admit: 2015-07-15 | Discharge: 2015-07-15 | Disposition: A | Discharge status: Home / Self Care | Payer: Medicare Other | Source: Ambulatory Visit

## 2015-07-15 DIAGNOSIS — Z1231 Encounter for screening mammogram for malignant neoplasm of breast: Secondary | ICD-10-CM

## 2015-07-21 ENCOUNTER — Telehealth: Payer: Self-pay | Admitting: Family Medicine

## 2015-07-21 ENCOUNTER — Other Ambulatory Visit: Payer: Self-pay | Admitting: Family Medicine

## 2015-07-21 DIAGNOSIS — E2839 Other primary ovarian failure: Secondary | ICD-10-CM | POA: Insufficient documentation

## 2015-07-21 NOTE — Telephone Encounter (Signed)
Pt is due for her mcr wellness after 02/26 and Dr Glori Bickers is backed up on cpe until September Can I put her in a different 30 min slot? Thank you

## 2015-07-21 NOTE — Telephone Encounter (Signed)
cpe scheduled for 02/27 labs for 02/22 Pt aware

## 2015-07-21 NOTE — Telephone Encounter (Signed)
Veronica Ward is fine I will do the order

## 2015-07-21 NOTE — Telephone Encounter (Signed)
Also, pt is due for a bone density, can you put in order so that it may be scheduled?

## 2015-08-26 ENCOUNTER — Ambulatory Visit (INDEPENDENT_AMBULATORY_CARE_PROVIDER_SITE_OTHER): Payer: Medicare Other | Admitting: Family Medicine

## 2015-08-26 ENCOUNTER — Encounter: Payer: Self-pay | Admitting: Family Medicine

## 2015-08-26 VITALS — BP 110/60 | HR 81 | Temp 98.2°F | Ht 63.0 in | Wt 108.5 lb

## 2015-08-26 DIAGNOSIS — M7502 Adhesive capsulitis of left shoulder: Secondary | ICD-10-CM | POA: Diagnosis not present

## 2015-08-26 NOTE — Progress Notes (Signed)
Pre visit review using our clinic review tool, if applicable. No additional management support is needed unless otherwise documented below in the visit note. 

## 2015-08-26 NOTE — Progress Notes (Signed)
Dr. Frederico Hamman T. Havier Deeb, MD, [facility] [place], [address] Phone: [phone] Fax: [phone]  08/26/2015  Patient: Veronica Ward, MRN: [id number], DOB: 16-Nov-1945, 70 y.o.  Primary Physician:  [name], MD  Chief Complaint: Arm Pain and Neck Pain  Subjective:   [name] is a 70 y.o. very pleasant female patient who presents with the following:  Prior injury and frozen shoulder history are detailed below.  Overall, she is much improved compared to either my prior examinations.  She still has a dull ache in her shoulder, particularly at nighttime, and she has some loss of motion of the internal range of motion compared to the contralateral side.  She was at one point being very diligent with doing home rehabilitation and physical therapy, but this is decreased some more recently.  Her range of motion improved quite a bit after I did an intra-articular shoulder injection last year also.  For a while, ROM improved and pain improved. Can pick things up and lift grandchildren.   Sometimes will hurt now and hard to say now or when and will get a deep anterior ache, sometimes all the way around shoulder and sometimes radiating. Still will hurt some at night. Will get a pulling in her shoulder.   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  D/w trey at PT and there are some times when it will throb.  Other times when suddenly will hurt "or something inside arm is broken" Generally, her motion is improved, but she is still having times of she is having a deep dull ache in a T-shirt distribution.  Overall, her pain is better, but she is still is having some symptoms, particularly with terminal pain.  Notably, her internal range of motion is bothering her some.  Now she is having times where her forearm is throbbing and  nauseous.  Sometimes hurt in the arm and in the forearm.  She will occasionally have some problems distally in her forearm. She on my questioning tells me that she does have some issues with sleeping with her forearm and arm in flexion.  L intraartic shoulder inj  12/07/2014 Last OV with Owens Loffler, MD  Consulting Physician: Dr. Glori Bickers  My partner asked me to evaluate the patient for left-sided shoulder pain.  The patient reports a history where she obtained a Prevnar 13 vaccine on September 18, 2014.  She states that she received this vaccine in the location around the inferior aspect of her deltoid or just below this per her report. She tells me that she had a topical reaction after the injection, and she developed pain in this region after her injection at that time.  She denies having had any sort of problem or pain in that region prior to the date of this injection.  09/18/2014 Prevnar vaccine.  Subsequently, after this she did develop some pain around this region locally, and she also felt as if she had some difference in her strength.  She does relate an event where she was at [employer], and she picked up a prepackaged supply of watermelon with 3 or 4 slices of watermelon, and she was unable to hold this and subsequently dropped it. She states that she had other instances as well, and has felt like her strength has not improved,  however recently, she has not had such a problem.  Her symptoms have changed over time. She saw Dr. Glori Bickers on 09/29/2014, and she seems to indicate that the patient had subacromial bursitis and RTC tendinopathy at that time. She was placed on RICE and mobic, had continued symptoms and followed up with me today.  t this point her pain has been throughout the left upper extremity and concentrated in the shoulder.  She has a throbbing type pain per her description that is all over the arm and seems to be in a T-shirt distribution.  She has pain at nighttime,  and she also has pain in abduction.  She has no significant history of shoulder trauma, fracture, dislocation, or operative intervention.  RTC and secondary frozen shoulder.   Past Medical History, Surgical History, Social History, Family History, Problem List, Medications, and Allergies have been reviewed and updated if relevant.  GEN: No fevers, chills. Nontoxic. Primarily MSK c/o today. MSK: Detailed in the HPI GI: tolerating PO intake without difficulty Neuro: as above Otherwise the pertinent positives of the ROS are noted above.   Objective:   BP 110/60 mmHg  Pulse 81  Temp(Src) 98.2 F (36.8 C) (Oral)  Ht 5\' 3"  (1.6 m)  Wt 108 lb 8 oz (49.215 kg)  BMI 19.22 kg/m2  LMP 09/17/2000   GEN: WDWN, NAD, Non-toxic, Alert & Oriented x 3 HEENT: Atraumatic, Normocephalic.  Ears and Nose: No external deformity. EXTR: No clubbing/cyanosis/edema NEURO: Normal gait.  PSYCH: Normally interactive. Conversant. Not depressed or anxious appearing.  Calm demeanor.    Cervical spine: Full range of motion.  No inducible pain with motion at cervical spine.  Posterior paracervical musculature is normal and not tender to palpation.  No tenderness in the upper trapezius musculature.  Spurling's examination is negative.  Left shoulder: Nontender along the clavicle.  Mildly tender at the acromioclavicular joint. Mildly tender at upper pec. There is minimal to no tenderness in the bicipital groove.  Abduction, strength is 5/5.  Full. Flexion, strength is 5/5.  Terminal motion full Obtained in 90 of abduction. Internal range of motion, strength is 5/5.  Terminal motion lacks approximately 45 compared to the contralateral side. External range of motion, strength is 5/5.  Full motion Crossover testing is neg Neer testing is neg Michel Bickers test is neg  C5-T1 appear fully intact on exam today  Full flexion and extension at the elbow.  Full pronation and  supination.  Radiology: EXAM: LEFT SHOULDER - 2+ VIEW  COMPARISON: None.  FINDINGS: There is no evidence of fracture or dislocation. There is no evidence of arthropathy or other focal bone abnormality. Soft tissues are unremarkable.  IMPRESSION: Negative.   Electronically Signed  By: Rolm Baptise M.D.  On: 09/30/2014 08:02  Assessment and Plan:   Adhesive capsulitis, left  Clinical history is still consistent with resolving adhesive capsulitis.  Internal range of motion is always the last to recover.  Reviewed this again.  I gave her a posterior capsular protocol from [place].  I would anticipate that this will continue to improve with time.  Follow-up: prn  Signed,  Jyair Kiraly T. Amar Sippel, MD   Patient's Medications  New Prescriptions   No medications on file  Previous Medications   ALENDRONATE (FOSAMAX) 70 MG TABLET    TAKE 1 TABLET BY MOUTH 30 MINUTES BEFOREBREAKFAST ONCE A WEEK WITH ATLEAST 8 OZ. OF WATER.   ASCORBIC ACID (VITAMIN C) 100 MG TABLET    Take 100 mg by mouth daily.  CALCIUM PO    Take 1,000 mg by mouth daily.   CHOLECALCIFEROL (VITAMIN D) 400 UNITS TABS    Take 400 Units by mouth daily.    CO-ENZYME Q-10 30 MG CAPSULE    Take 30 mg by mouth 3 (three) times daily.   FISH OIL-OMEGA-3 FATTY ACIDS 1000 MG CAPSULE    Take by mouth 2 (two) times daily.    FOLIC ACID (FOLVITE) 1 MG TABLET    Take 1 mg by mouth daily.   IBUPROFEN (ADVIL,MOTRIN) 200 MG TABLET    Take 200 mg by mouth as needed for pain.   LACTOBACILLUS (CVS PROBIOTIC ACIDOPHILUS) 10 MG CAPS    Take 1 capsule by mouth daily.    MAGNESIUM 30 MG TABLET    Take 30 mg by mouth daily.   MULTIPLE VITAMIN (MULTIVITAMIN) TABLET    Take 1 tablet by mouth daily.   PYRIDOXINE (VITAMIN B-6) 100 MG TABLET    Take 100 mg by mouth daily.   VITAMIN B-12 (CYANOCOBALAMIN) 100 MCG TABLET    Take 100 mcg by mouth daily.  Modified Medications   No medications on file  Discontinued Medications    AZITHROMYCIN (ZITHROMAX Z-PAK) 250 MG TABLET    Take 2 pills by mouth today and then 1 pill daily for 4 days

## 2015-08-30 ENCOUNTER — Other Ambulatory Visit: Payer: Self-pay | Admitting: Family Medicine

## 2015-09-02 ENCOUNTER — Ambulatory Visit (INDEPENDENT_AMBULATORY_CARE_PROVIDER_SITE_OTHER)
Admission: RE | Admit: 2015-09-02 | Discharge: 2015-09-02 | Disposition: A | Discharge status: Home / Self Care | Payer: Medicare Other | Source: Ambulatory Visit | Attending: Family Medicine | Admitting: Family Medicine

## 2015-09-02 DIAGNOSIS — E2839 Other primary ovarian failure: Secondary | ICD-10-CM

## 2015-09-03 ENCOUNTER — Ambulatory Visit: Payer: Medicare Other | Attending: Gynecology | Admitting: Gynecology

## 2015-09-03 ENCOUNTER — Encounter: Payer: Self-pay | Admitting: Gynecology

## 2015-09-03 ENCOUNTER — Other Ambulatory Visit (HOSPITAL_COMMUNITY)
Admission: RE | Admit: 2015-09-03 | Discharge: 2015-09-03 | Disposition: A | Discharge status: Home / Self Care | Payer: Medicare Other | Source: Ambulatory Visit | Attending: Gynecologic Oncology | Admitting: Gynecologic Oncology

## 2015-09-03 VITALS — BP 123/59 | HR 66 | Temp 97.7°F | Resp 18 | Ht 63.0 in | Wt 107.1 lb

## 2015-09-03 DIAGNOSIS — L509 Urticaria, unspecified: Secondary | ICD-10-CM | POA: Insufficient documentation

## 2015-09-03 DIAGNOSIS — Z90710 Acquired absence of both cervix and uterus: Secondary | ICD-10-CM | POA: Diagnosis not present

## 2015-09-03 DIAGNOSIS — Z800 Family history of malignant neoplasm of digestive organs: Secondary | ICD-10-CM | POA: Insufficient documentation

## 2015-09-03 DIAGNOSIS — M810 Age-related osteoporosis without current pathological fracture: Secondary | ICD-10-CM | POA: Diagnosis not present

## 2015-09-03 DIAGNOSIS — Z8542 Personal history of malignant neoplasm of other parts of uterus: Secondary | ICD-10-CM

## 2015-09-03 DIAGNOSIS — G479 Sleep disorder, unspecified: Secondary | ICD-10-CM | POA: Diagnosis not present

## 2015-09-03 DIAGNOSIS — F419 Anxiety disorder, unspecified: Secondary | ICD-10-CM | POA: Insufficient documentation

## 2015-09-03 DIAGNOSIS — Z85828 Personal history of other malignant neoplasm of skin: Secondary | ICD-10-CM | POA: Diagnosis not present

## 2015-09-03 DIAGNOSIS — G901 Familial dysautonomia [Riley-Day]: Secondary | ICD-10-CM | POA: Diagnosis not present

## 2015-09-03 DIAGNOSIS — Z87891 Personal history of nicotine dependence: Secondary | ICD-10-CM | POA: Diagnosis not present

## 2015-09-03 DIAGNOSIS — C541 Malignant neoplasm of endometrium: Secondary | ICD-10-CM

## 2015-09-03 DIAGNOSIS — Z8249 Family history of ischemic heart disease and other diseases of the circulatory system: Secondary | ICD-10-CM | POA: Diagnosis not present

## 2015-09-03 DIAGNOSIS — Z923 Personal history of irradiation: Secondary | ICD-10-CM | POA: Diagnosis not present

## 2015-09-03 DIAGNOSIS — Z01411 Encounter for gynecological examination (general) (routine) with abnormal findings: Secondary | ICD-10-CM | POA: Insufficient documentation

## 2015-09-03 DIAGNOSIS — D649 Anemia, unspecified: Secondary | ICD-10-CM | POA: Insufficient documentation

## 2015-09-03 DIAGNOSIS — Z803 Family history of malignant neoplasm of breast: Secondary | ICD-10-CM | POA: Diagnosis not present

## 2015-09-03 DIAGNOSIS — M899 Disorder of bone, unspecified: Secondary | ICD-10-CM | POA: Diagnosis not present

## 2015-09-03 DIAGNOSIS — Z8041 Family history of malignant neoplasm of ovary: Secondary | ICD-10-CM | POA: Insufficient documentation

## 2015-09-03 DIAGNOSIS — N6019 Diffuse cystic mastopathy of unspecified breast: Secondary | ICD-10-CM | POA: Insufficient documentation

## 2015-09-03 NOTE — Progress Notes (Signed)
Consult Note: Gyn-Onc   Veronica Ward 70 y.o. female  Chief Complaint  Patient presents with  . endometrial cancer    Follow up    Assessment : Endometrial carcinoma (papillary serous) stage I a (March 2015). Status post 6 cycles of carboplatin Taxol chemotherapy and vaginal vault brachytherapy. Patient is clinically free of disease.  Plan:  She will see Dr, Sondra Come in 3 months. I plan on seeing the patient back in 6 months. Pap smears are obtained today she will continue to have annual mammograms.  Interval history: The patient returns today for continuing follow-up of endometrial carcinoma. Since her last visit with gynecologic oncology she's been seen by Dr. Sondra Come. Today we she reports she's having no problems and her health is been good. She specifically denies any GI or GU symptoms no pelvic pain pressure vaginal bleeding or discharge. She has no neuropathy.  HPI: 70 year old white female seen in consultation request of Dr. Mora Bellman regarding management of a newly diagnosed endometrial cancer. Approximately 3 weeks ago the patient developed some vaginal spotting. She was seen by Dr. Elly Modena on February 25. An endometrial biopsy was obtained showing a grade 2 endometrial carcinoma. The patient is also had an ultrasound of the pelvis revealing a uterus measuring 7 x 3 x 3.7 cm. The endometrial stripe was 7 mm.  [name] She has no other gynecologic history.   Patient underwent robotic hysterectomy bilateral salpingo-oophorectomy and pelvic lymphadenectomy at [facility] on 10/03/2013. 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 Adjuvant therapy with carboplatin and Taxol and vaginal vault brachytherapy was given completed in August 2015.   Review of Systems:10 point review of systems is  negative except as noted in interval history.   Vitals: Blood pressure 123/59, pulse 66, temperature 97.7 F (36.5 C), temperature source Oral, resp. rate 18, height '5\' 3"'  (1.6 m), weight 107 lb 1.6 oz (48.58 kg), last menstrual period 09/17/2000, SpO2 100 %.  Physical Exam: General : The patient is a healthy woman in no acute distress.  HEENT: normocephalic, extraoccular movements normal; neck is supple without thyromegally  Lynphnodes: Supraclavicular and inguinal nodes not enlarged  Abdomen: Soft, non-tender, no ascites, no organomegally, no masses, no hernias  Pelvic:  EGBUS: Normal female  Vagina: Normal, no lesions, atrophic , the cuff closure is intact and is healing appropriately. Urethra and Bladder: Normal, non-tender  Cervix: Surgically absent Uterus: Surgically absent Bi-manual examination: Non-tender; no adenxal masses or nodularity  Rectal: normal sphincter tone, no masses, no blood  Lower extremities: No edema or varicosities. Normal range of motion      Allergies  Allergen Reactions  . Bactrim [Sulfamethoxazole-Trimethoprim] Rash  . Benadryl [Diphenhydramine Hcl (Sleep)] Rash and Anxiety    Fever, body ache  . Oxycodone-Acetaminophen Anxiety  . Sulfa Antibiotics Rash    Past Medical History  Diagnosis Date  . Hx of basal cell carcinoma     face  . Diffuse cystic mastopathy   . Headache(784.0)   . Unspecified hemorrhoids without mention of complication   . Other malaise and fatigue   . Disorder of bone and cartilage, unspecified   . Other psoriasis   . Other seborrheic keratosis   . Sleep disturbance, unspecified   . Predominant disturbance of emotions   . Pain in limb   .  Urticaria, unspecified   . History of shingles   . Anxiety   . Anemia   . Dysautonomia     mild  . Osteoporosis   . Endometrial cancer ([place])   . Ashkenazi Jewish ancestry   . Radiation 01/06/14, 12/23/13, 12/16/13, 12/09/13    HDR brachytherapy    Past Surgical History   Procedure Laterality Date  . Cervical conization w/bx    . Cervical cancer  1973  . Breast biopsy Left 2001    benign  . Tubal ligation    . Dexa  03/2002    oseopenia  . Colonoscopy  12/04    int. hemorrhoids  . Dexa  07/2004    decreased BMD, osteopenia  . Dexa  02/2007    Osteopenia  . Breast lumpectomy      left underarm  . Birthmark removal  childhood  . Dexa  9/10    Osteopenia slightly worse  . Felon i&d  12/2010    Dr. Fredna Dow, I&D in OR (L index finger)  . Finger surgery    . Knee surgery      right  . Robotic assisted total hysterectomy with bilateral salpingo oopherectomy  10/03/2013    Robotic-assisted total laparoscopic hysterectomy, bilateral salpingo-oophorectomy, bilateral pelvic and para-aortic lymphadenectomy with sentinel lymph node dissection  . Tonsilectomy, adenoidectomy, bilateral myringotomy and tubes    . Axillary lymph node dissection      left    Current Outpatient Prescriptions  Medication Sig Dispense Refill  . alendronate (FOSAMAX) 70 MG tablet TAKE 1 TABLET BY MOUTH 30 MINUTES BEFOREBREAKFAST ONCE A WEEK WITH ATLEAST 8 OZ. OF WATER. 4 tablet 0  . CALCIUM PO Take 1,000 mg by mouth daily.    . cholecalciferol (VITAMIN D) 400 UNITS TABS Take 400 Units by mouth daily.     Marland Kitchen co-enzyme Q-10 30 MG capsule Take 30 mg by mouth 3 (three) times daily.    . fish oil-omega-3 fatty acids 1000 MG capsule Take by mouth 2 (two) times daily.     . folic acid (FOLVITE) 1 MG tablet Take 1 mg by mouth daily.    Marland Kitchen ibuprofen (ADVIL,MOTRIN) 200 MG tablet Take 200 mg by mouth as needed for pain.    . Lactobacillus (CVS PROBIOTIC ACIDOPHILUS) 10 MG CAPS Take 1 capsule by mouth daily.     . magnesium 30 MG tablet Take 30 mg by mouth daily.    . Multiple Vitamin (MULTIVITAMIN) tablet Take 1 tablet by mouth daily.    Marland Kitchen pyridOXINE (VITAMIN B-6) 100 MG tablet Take 100 mg by mouth daily.    . vitamin B-12 (CYANOCOBALAMIN) 100 MCG tablet Take 100 mcg by mouth daily.    .  Ascorbic Acid (VITAMIN C) 100 MG tablet Take 100 mg by mouth daily. Reported on 09/03/2015     No current facility-administered medications for this visit.    Social History   Social History  . Marital Status: Married    Spouse Name: N/A  . Number of Children: 3  . Years of Education: N/A   Occupational History  . Professor    Social History Main Topics  . Smoking status: Former Smoker    Quit date: 07/25/1963  . Smokeless tobacco: Never Used  . Alcohol Use: 0.6 oz/week    0 Standard drinks or equivalent, 1 Glasses of wine per week     Comment: 1/2 glass of wine a day  . Drug Use: No  . Sexual Activity: Yes  Birth Control/ Protection: Post-menopausal   Other Topics Concern  . Not on file   Social History Narrative   History professor      Likes to dance      Is a walker and does floor exercises      Caffeine: 2 cups daily    Family History  Problem Relation Age of Onset  . Multiple myeloma Father   . Coronary artery disease Father   . Transient ischemic attack Mother   . Lupus Maternal Aunt   . Breast cancer Maternal Aunt     dx 18s; deceased  . Cancer Maternal Grandfather     GI cancer or pancreatic; deceased 82  . Colon cancer Neg Hx   . Ovarian cancer Maternal Aunt     dx 8s; deceased 29s  . Multiple sclerosis Brother       Alvino Chapel, MD 09/03/2015, 11:29 AM         Consult Note: Gyn-Onc   Veronica Ward 70 y.o. female  Chief Complaint  Patient presents with  . endometrial cancer    Follow up    Assessment : Endometrial carcinoma (papillary serous) stage I a (March 2015). Status post 6 cycles of carboplatin Taxol chemotherapy and vaginal vault brachytherapy. Patient's clinically free of disease.  Plan:  She will see Dr. Marko Plume and Sondra Come as scheduled. I plan on seeing the patient back in August 2016. Pap smears are obtained today  Interval history: The patient returns today for continuing follow-up of endometrial  carcinoma. Since her last visit with gynecologic oncology she's been seen by Dr. Marko Plume and Sondra Come. Today we she reports she's having no problems and her health is been good except for a GI virus 2 weeks ago which cleared spontaneously. She specifically denies any GI or GU symptoms no pelvic pain pressure vaginal bleeding or discharge.  HPI: 70 year old white female seen in consultation request of Dr. Mora Bellman regarding management of a newly diagnosed endometrial cancer. Approximately 3 weeks ago the patient developed some vaginal spotting. She was seen by Dr. Elly Modena on February 25. An endometrial biopsy was obtained showing a grade 2 endometrial carcinoma. The patient is also had an ultrasound of the pelvis revealing a uterus measuring 7 x 3 x 3.7 cm. The endometrial stripe was 7 mm.  [name] She has no other gynecologic history.   Patient underwent robotic hysterectomy bilateral salpingo-oophorectomy and pelvic lymphadenectomy at [facility] on 10/03/2013. 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 Adjuvant therapy with carboplatin and Taxol and vaginal vault brachytherapy was advised. The patient had a uncomplicated postoperative course.    Review of Systems:10 point review of systems is negative except as noted in interval history.   Vitals: Blood pressure 123/59, pulse 66, temperature 97.7 F (36.5 C), temperature source Oral, resp. rate 18, height '5\' 3"'  (1.6 m), weight 107 lb 1.6 oz (48.58 kg), last menstrual period 09/17/2000, SpO2 100 %.  Physical Exam: General : The patient is a healthy woman in no acute distress.  HEENT: normocephalic, extraoccular movements normal; neck is supple without thyromegally  Lynphnodes: Supraclavicular and inguinal nodes not enlarged  Abdomen: Soft,  non-tender, no ascites, no organomegally, no masses, no hernias  Pelvic:  EGBUS: Normal female  Vagina: Normal, no lesions, atrophic , the cuff closure is intact  and is healing appropriately. Urethra and Bladder: Normal, non-tender  Cervix: Surgically absent Uterus: Surgically absent Bi-manual examination: Non-tender; no adenxal masses or nodularity  Rectal: normal sphincter tone, no masses, no blood  Lower extremities: No edema or varicosities. Normal range of motion      Allergies  Allergen Reactions  . Bactrim [Sulfamethoxazole-Trimethoprim] Rash  . Benadryl [Diphenhydramine Hcl (Sleep)] Rash and Anxiety    Fever, body ache  . Oxycodone-Acetaminophen Anxiety  . Sulfa Antibiotics Rash    Past Medical History  Diagnosis Date  . Hx of basal cell carcinoma     face  . Diffuse cystic mastopathy   . Headache(784.0)   . Unspecified hemorrhoids without mention of complication   . Other malaise and fatigue   . Disorder of bone and cartilage, unspecified   . Other psoriasis   . Other seborrheic keratosis   . Sleep disturbance, unspecified   . Predominant disturbance of emotions   . Pain in limb   . Urticaria, unspecified   . History of shingles   . Anxiety   . Anemia   . Dysautonomia     mild  . Osteoporosis   . Endometrial cancer ([place])   . Ashkenazi Jewish ancestry   . Radiation 01/06/14, 12/23/13, 12/16/13, 12/09/13    HDR brachytherapy    Past Surgical History  Procedure Laterality Date  . Cervical conization w/bx    . Cervical cancer  1973  . Breast biopsy Left 2001    benign  . Tubal ligation    . Dexa  03/2002    oseopenia  . Colonoscopy  12/04    int. hemorrhoids  . Dexa  07/2004    decreased BMD, osteopenia  . Dexa  02/2007    Osteopenia  . Breast lumpectomy      left underarm  . Birthmark removal  childhood  . Dexa  9/10    Osteopenia slightly worse  . Felon i&d  12/2010    Dr. Fredna Dow, I&D in OR (L index finger)  . Finger surgery    . Knee surgery       right  . Robotic assisted total hysterectomy with bilateral salpingo oopherectomy  10/03/2013    Robotic-assisted total laparoscopic hysterectomy, bilateral salpingo-oophorectomy, bilateral pelvic and para-aortic lymphadenectomy with sentinel lymph node dissection  . Tonsilectomy, adenoidectomy, bilateral myringotomy and tubes    . Axillary lymph node dissection      left    Current Outpatient Prescriptions  Medication Sig Dispense Refill  . alendronate (FOSAMAX) 70 MG tablet TAKE 1 TABLET BY MOUTH 30 MINUTES BEFOREBREAKFAST ONCE A WEEK WITH ATLEAST 8 OZ. OF WATER. 4 tablet 0  . CALCIUM PO Take 1,000 mg by mouth daily.    . cholecalciferol (VITAMIN D) 400 UNITS TABS Take 400 Units by mouth daily.     Marland Kitchen co-enzyme Q-10 30 MG capsule Take 30 mg by mouth 3 (three) times daily.    . fish oil-omega-3 fatty acids 1000 MG capsule Take by mouth 2 (two) times daily.     . folic acid (FOLVITE) 1 MG tablet Take 1 mg by mouth daily.    Marland Kitchen ibuprofen (ADVIL,MOTRIN) 200 MG tablet Take 200 mg by mouth as needed for pain.    . Lactobacillus (CVS PROBIOTIC ACIDOPHILUS) 10 MG CAPS Take 1 capsule by mouth daily.     . magnesium 30 MG tablet Take 30 mg by mouth daily.    . Multiple Vitamin (MULTIVITAMIN) tablet Take 1 tablet by mouth daily.    Marland Kitchen  pyridOXINE (VITAMIN B-6) 100 MG tablet Take 100 mg by mouth daily.    . vitamin B-12 (CYANOCOBALAMIN) 100 MCG tablet Take 100 mcg by mouth daily.    . Ascorbic Acid (VITAMIN C) 100 MG tablet Take 100 mg by mouth daily. Reported on 09/03/2015     No current facility-administered medications for this visit.    Social History   Social History  . Marital Status: Married    Spouse Name: N/A  . Number of Children: 3  . Years of Education: N/A   Occupational History  . Professor    Social History Main Topics  . Smoking status: Former Smoker    Quit date: 07/25/1963  . Smokeless tobacco: Never Used  . Alcohol Use: 0.6 oz/week    0 Standard drinks or equivalent, 1  Glasses of wine per week     Comment: 1/2 glass of wine a day  . Drug Use: No  . Sexual Activity: Yes    Birth Control/ Protection: Post-menopausal   Other Topics Concern  . Not on file   Social History Narrative   History professor      Likes to dance      Is a walker and does floor exercises      Caffeine: 2 cups daily    Family History  Problem Relation Age of Onset  . Multiple myeloma Father   . Coronary artery disease Father   . Transient ischemic attack Mother   . Lupus Maternal Aunt   . Breast cancer Maternal Aunt     dx 50s; deceased  . Cancer Maternal Grandfather     GI cancer or pancreatic; deceased 25  . Colon cancer Neg Hx   . Ovarian cancer Maternal Aunt     dx 68s; deceased 7s  . Multiple sclerosis Brother       Alvino Chapel, MD 09/03/2015, 11:29 AM

## 2015-09-03 NOTE — Patient Instructions (Signed)
We will call you with the results of your pap smear. Your appointment with Dr. Sondra Come is in May 2017 so plan to follow up with Dr. Fermin Schwab three months after (August 2017).  Please call [phone] after you see Dr. Sondra Come to schedule your appointment with Dr. Fermin Schwab.

## 2015-09-08 LAB — CYTOLOGY - PAP

## 2015-09-09 ENCOUNTER — Telehealth: Payer: Self-pay

## 2015-09-09 NOTE — Telephone Encounter (Signed)
Orders received to from [facility] to contact the patient tp update with PAP results are "normal" from MD visit on 09/03/2015 . Patient contacted and updated , patient states understanding , denies further questions at this time , patient will call with any changes .

## 2015-09-12 ENCOUNTER — Telehealth: Payer: Self-pay | Admitting: Family Medicine

## 2015-09-12 DIAGNOSIS — Z0000 Encounter for general adult medical examination without abnormal findings: Secondary | ICD-10-CM

## 2015-09-12 NOTE — Telephone Encounter (Signed)
-----   Message from Marchia Bond sent at 09/09/2015 10:55 AM EST ----- Regarding: Cpx labs Tues 2/21, need orders. Thanks! :) Please order  future cpx labs for pt's upcoming lab appt. Thanks Aniceto Boss

## 2015-09-14 ENCOUNTER — Other Ambulatory Visit (INDEPENDENT_AMBULATORY_CARE_PROVIDER_SITE_OTHER): Payer: Medicare Other

## 2015-09-14 DIAGNOSIS — Z0000 Encounter for general adult medical examination without abnormal findings: Secondary | ICD-10-CM

## 2015-09-14 LAB — COMPREHENSIVE METABOLIC PANEL
ALT: 19 U/L (ref 0–35)
AST: 25 U/L (ref 0–37)
Albumin: 4.4 g/dL (ref 3.5–5.2)
Alkaline Phosphatase: 41 U/L (ref 39–117)
BUN: 16 mg/dL (ref 6–23)
CO2: 30 mEq/L (ref 19–32)
Calcium: 9.4 mg/dL (ref 8.4–10.5)
Chloride: 106 mEq/L (ref 96–112)
Creatinine, Ser: 0.66 mg/dL (ref 0.40–1.20)
GFR: 93.67 mL/min (ref >60.00)
Glucose, Bld: 93 mg/dL (ref 70–99)
Potassium: 3.8 mEq/L (ref 3.5–5.1)
Sodium: 140 mEq/L (ref 135–145)
Total Bilirubin: 0.4 mg/dL (ref 0.2–1.2)
Total Protein: 7.1 g/dL (ref 6.0–8.3)

## 2015-09-14 LAB — CBC WITH DIFFERENTIAL/PLATELET
Basophils Absolute: 0 10*3/uL (ref 0.0–0.1)
Basophils Relative: 0.5 % (ref 0.0–3.0)
Eosinophils Absolute: 0.1 10*3/uL (ref 0.0–0.7)
Eosinophils Relative: 2.4 % (ref 0.0–5.0)
HCT: 37 % (ref 36.0–46.0)
Hemoglobin: 12.6 g/dL (ref 12.0–15.0)
Lymphocytes Relative: 26.3 % (ref 12.0–46.0)
Lymphs Abs: 1.2 10*3/uL (ref 0.7–4.0)
MCHC: 34.1 g/dL (ref 30.0–36.0)
MCV: 96.8 fl (ref 78.0–100.0)
Monocytes Absolute: 0.4 10*3/uL (ref 0.1–1.0)
Monocytes Relative: 8.6 % (ref 3.0–12.0)
Neutro Abs: 2.8 10*3/uL (ref 1.4–7.7)
Neutrophils Relative %: 62.2 % (ref 43.0–77.0)
Platelets: 185 10*3/uL (ref 150.0–400.0)
RBC: 3.82 Mil/uL — ABNORMAL LOW (ref 3.87–5.11)
RDW: 13.6 % (ref 11.5–15.5)
WBC: 4.6 10*3/uL (ref 4.0–10.5)

## 2015-09-14 LAB — LIPID PANEL
Cholesterol: 213 mg/dL — ABNORMAL HIGH (ref 0–200)
HDL: 85 mg/dL (ref >39.00)
LDL Cholesterol: 116 mg/dL — ABNORMAL HIGH (ref 0–99)
NonHDL: 128.36
Total CHOL/HDL Ratio: 3
Triglycerides: 60 mg/dL (ref 0.0–149.0)
VLDL: 12 mg/dL (ref 0.0–40.0)

## 2015-09-14 LAB — TSH: TSH: 2.91 u[IU]/mL (ref 0.35–4.50)

## 2015-09-20 ENCOUNTER — Ambulatory Visit (INDEPENDENT_AMBULATORY_CARE_PROVIDER_SITE_OTHER): Payer: Medicare Other | Admitting: Family Medicine

## 2015-09-20 ENCOUNTER — Encounter: Payer: Self-pay | Admitting: Family Medicine

## 2015-09-20 VITALS — BP 116/64 | HR 64 | Temp 98.1°F | Ht 62.75 in | Wt 105.5 lb

## 2015-09-20 DIAGNOSIS — M810 Age-related osteoporosis without current pathological fracture: Secondary | ICD-10-CM | POA: Diagnosis not present

## 2015-09-20 DIAGNOSIS — Z0000 Encounter for general adult medical examination without abnormal findings: Secondary | ICD-10-CM | POA: Diagnosis not present

## 2015-09-20 DIAGNOSIS — C541 Malignant neoplasm of endometrium: Secondary | ICD-10-CM | POA: Diagnosis not present

## 2015-09-20 DIAGNOSIS — Z1211 Encounter for screening for malignant neoplasm of colon: Secondary | ICD-10-CM

## 2015-09-20 MED ORDER — ALENDRONATE SODIUM 70 MG PO TABS: ORAL_TABLET | ORAL | Status: DC

## 2015-09-20 NOTE — Progress Notes (Signed)
Subjective:    Patient ID: Veronica Ward, female    DOB: 07-06-1946, 70 y.o.   MRN: [id number]  HPI Here for annual medicare wellness visit as well as chronic/acute medical problems as well as annual preventative examination  I have personally reviewed the Medicare Annual Wellness questionnaire and have noted 1. The patient's medical and social history 2. Their use of alcohol, tobacco or illicit drugs 3. Their current medications and supplements 4. The patient's functional ability including ADL's, fall risks, home safety risks and hearing or visual             impairment. 5. Diet and physical activities 6. Evidence for depression or mood disorders  The patients weight, height, BMI have been recorded in the chart and visual acuity is per eye clinic.  I have made referrals, counseling and provided education to the patient based review of the above and I have provided the pt with a written personalized care plan for preventive services. Reviewed and updated provider list, see scanned forms.  See scanned forms.  Routine anticipatory guidance given to patient.  See health maintenance. Colon cancer screening 12/14 - normal - no recall but this may depend on oncologist opinion  Hep C screening -not high risk /declines  Breast cancer screening 12/16 nl - had a breast exam and it was fine / from gyn Self breast exam-no lumps  Pap nl 2/17 -normal  Flu vaccine 9/16  Tetanus vaccine 6/11  Pneumovax - complete on both  Zoster vaccine dexa recent 2017- mixed trend On fosamax No falls or fx  On can and D   Advance directive - has a living will or power of attorney  Cognitive function addressed- see scanned forms- and if abnormal then additional documentation follows. Memory is very good / very busy/ works/ socializes   Still exercising and dancing  Walking with her husband also -very active Started wearing a fit bit for fun   Wt is down 2 lb with bmi of 18  May need to eat a bit more-  hard to snack more if she is not hungry- but in general eats well and likes to eat    PMH and SH reviewed  Meds, vitals, and allergies reviewed.   ROS: See HPI.  Otherwise negative.    She still works because she wants to   Cholesterol Lab Results  Component Value Date   CHOL 213* 09/14/2015   CHOL 179 09/10/2014   CHOL 202* 03/04/2013   Lab Results  Component Value Date   HDL 85.00 09/14/2015   HDL 64.60 09/10/2014   HDL 67.50 03/04/2013   Lab Results  Component Value Date   LDLCALC 116* 09/14/2015   LDLCALC 104* 09/10/2014   LDLCALC 117* 03/29/2011   Lab Results  Component Value Date   TRIG 60.0 09/14/2015   TRIG 52.0 09/10/2014   TRIG 75.0 03/04/2013   Lab Results  Component Value Date   CHOLHDL 3 09/14/2015   CHOLHDL 3 09/10/2014   CHOLHDL 3 03/04/2013   Lab Results  Component Value Date   LDLDIRECT 119.6 03/04/2013   very good cholesterol profile  She eats a lot of yogurt - some flatuance    Results for orders placed or performed in visit on 09/14/15  CBC with Differential/Platelet  Result Value Ref Range   WBC 4.6 4.0 - 10.5 K/uL   RBC 3.82 (L) 3.87 - 5.11 Mil/uL   Hemoglobin 12.6 12.0 - 15.0 g/dL   HCT 37.0 36.0 -  46.0 %   MCV 96.8 78.0 - 100.0 fl   MCHC 34.1 30.0 - 36.0 g/dL   RDW 13.6 11.5 - 15.5 %   Platelets 185.0 150.0 - 400.0 K/uL   Neutrophils Relative % 62.2 43.0 - 77.0 %   Lymphocytes Relative 26.3 12.0 - 46.0 %   Monocytes Relative 8.6 3.0 - 12.0 %   Eosinophils Relative 2.4 0.0 - 5.0 %   Basophils Relative 0.5 0.0 - 3.0 %   Neutro Abs 2.8 1.4 - 7.7 K/uL   Lymphs Abs 1.2 0.7 - 4.0 K/uL   Monocytes Absolute 0.4 0.1 - 1.0 K/uL   Eosinophils Absolute 0.1 0.0 - 0.7 K/uL   Basophils Absolute 0.0 0.0 - 0.1 K/uL  Comprehensive metabolic panel  Result Value Ref Range   Sodium 140 135 - 145 mEq/L   Potassium 3.8 3.5 - 5.1 mEq/L   Chloride 106 96 - 112 mEq/L   CO2 30 19 - 32 mEq/L   Glucose, Bld 93 70 - 99 mg/dL   BUN 16 6 - 23 mg/dL    Creatinine, Ser 0.66 0.40 - 1.20 mg/dL   Total Bilirubin 0.4 0.2 - 1.2 mg/dL   Alkaline Phosphatase 41 39 - 117 U/L   AST 25 0 - 37 U/L   ALT 19 0 - 35 U/L   Total Protein 7.1 6.0 - 8.3 g/dL   Albumin 4.4 3.5 - 5.2 g/dL   Calcium 9.4 8.4 - 10.5 mg/dL   GFR 93.67 >60.00 mL/min  Lipid panel  Result Value Ref Range   Cholesterol 213 (H) 0 - 200 mg/dL   Triglycerides 60.0 0.0 - 149.0 mg/dL   HDL 85.00 >39.00 mg/dL   VLDL 12.0 0.0 - 40.0 mg/dL   LDL Cholesterol 116 (H) 0 - 99 mg/dL   Total CHOL/HDL Ratio 3    NonHDL 128.36   TSH  Result Value Ref Range   TSH 2.91 0.35 - 4.50 uIU/mL     Patient Active Problem List   Diagnosis Date Noted  . Estrogen deficiency 07/21/2015  . Acute bronchitis 06/25/2015  . Left shoulder pain 09/29/2014  . Ashkenazi Jewish ancestry   . Endometrial cancer ([place]) 11/03/2013  . CIS (carcinoma in situ of cervix) 09/23/2013  . Encounter for Medicare annual wellness exam 03/12/2013  . Colon cancer screening 03/12/2013  . Routine general medical examination at a health care facility 03/03/2013  . Encounter for medication monitoring 11/23/2010  . SEBORRHEIC KERATOSIS 08/25/2009  . STRESS REACTION, ACUTE, WITH EMOTIONAL DISTURBANCE 12/09/2008  . SLEEP DISORDER 12/09/2008  . HEMORRHOIDS 10/12/2008  . Osteoporosis 10/08/2007  . BASAL CELL CARCINOMA, FACE 06/21/2007  . FIBROCYSTIC BREAST DISEASE 06/21/2007  . PSORIASIS 06/21/2007  . URTICARIA 06/21/2007  . BASAL CELL CARCINOMA, FACE 06/21/2007   Past Medical History  Diagnosis Date  . Hx of basal cell carcinoma     face  . Diffuse cystic mastopathy   . Headache(784.0)   . Unspecified hemorrhoids without mention of complication   . Other malaise and fatigue   . Disorder of bone and cartilage, unspecified   . Other psoriasis   . Other seborrheic keratosis   . Sleep disturbance, unspecified   . Predominant disturbance of emotions   . Pain in limb   . Urticaria, unspecified   . History of  shingles   . Anxiety   . Anemia   . Dysautonomia     mild  . Osteoporosis   . Endometrial cancer ([place])   . Ashkenazi Jewish  ancestry   . Radiation 01/06/14, 12/23/13, 12/16/13, 12/09/13    HDR brachytherapy   Past Surgical History  Procedure Laterality Date  . Cervical conization w/bx    . Cervical cancer  1973  . Breast biopsy Left 2001    benign  . Tubal ligation    . Dexa  03/2002    oseopenia  . Colonoscopy  12/04    int. hemorrhoids  . Dexa  07/2004    decreased BMD, osteopenia  . Dexa  02/2007    Osteopenia  . Breast lumpectomy      left underarm  . Birthmark removal  childhood  . Dexa  9/10    Osteopenia slightly worse  . Felon i&d  12/2010    Dr. Fredna Dow, I&D in OR (L index finger)  . Finger surgery    . Knee surgery      right  . Robotic assisted total hysterectomy with bilateral salpingo oopherectomy  10/03/2013    Robotic-assisted total laparoscopic hysterectomy, bilateral salpingo-oophorectomy, bilateral pelvic and para-aortic lymphadenectomy with sentinel lymph node dissection  . Tonsilectomy, adenoidectomy, bilateral myringotomy and tubes    . Axillary lymph node dissection      left   Social History  Substance Use Topics  . Smoking status: Former Smoker    Quit date: 07/25/1963  . Smokeless tobacco: Never Used  . Alcohol Use: 0.6 oz/week    0 Standard drinks or equivalent, 1 Glasses of wine per week     Comment: 1/2 glass of wine a day   Family History  Problem Relation Age of Onset  . Multiple myeloma Father   . Coronary artery disease Father   . Transient ischemic attack Mother   . Lupus Maternal Aunt   . Breast cancer Maternal Aunt     dx 21s; deceased  . Cancer Maternal Grandfather     GI cancer or pancreatic; deceased 107  . Colon cancer Neg Hx   . Ovarian cancer Maternal Aunt     dx 37s; deceased 70s  . Multiple sclerosis Brother    Allergies  Allergen Reactions  . Bactrim [Sulfamethoxazole-Trimethoprim] Rash  . Benadryl [Diphenhydramine  Hcl (Sleep)] Rash and Anxiety    Fever, body ache  . Oxycodone-Acetaminophen Anxiety  . Sulfa Antibiotics Rash   Current Outpatient Prescriptions on File Prior to Visit  Medication Sig Dispense Refill  . Ascorbic Acid (VITAMIN C) 100 MG tablet Take 100 mg by mouth daily. Reported on 09/03/2015    . CALCIUM PO Take 1,000 mg by mouth daily.    . cholecalciferol (VITAMIN D) 400 UNITS TABS Take 400 Units by mouth daily.     Marland Kitchen co-enzyme Q-10 30 MG capsule Take 30 mg by mouth 3 (three) times daily.    . fish oil-omega-3 fatty acids 1000 MG capsule Take by mouth 2 (two) times daily.     . folic acid (FOLVITE) 1 MG tablet Take 1 mg by mouth daily.    Marland Kitchen ibuprofen (ADVIL,MOTRIN) 200 MG tablet Take 200 mg by mouth as needed for pain.    . Lactobacillus (CVS PROBIOTIC ACIDOPHILUS) 10 MG CAPS Take 1 capsule by mouth daily.     . magnesium 30 MG tablet Take 30 mg by mouth daily.    . Multiple Vitamin (MULTIVITAMIN) tablet Take 1 tablet by mouth daily.    Marland Kitchen pyridOXINE (VITAMIN B-6) 100 MG tablet Take 100 mg by mouth daily.    . vitamin B-12 (CYANOCOBALAMIN) 100 MCG tablet Take 100 mcg by mouth daily.  No current facility-administered medications on file prior to visit.     Review of Systems Review of Systems  Constitutional: Negative for fever, appetite change, fatigue and unexpected weight change. pos for poor sleep  Eyes: Negative for pain and visual disturbance.  Respiratory: Negative for cough and shortness of breath.   Cardiovascular: Negative for cp or palpitations    Gastrointestinal: Negative for nausea, diarrhea and constipation.  Genitourinary: Negative for urgency and frequency.  Skin: Negative for pallor or rash   Neurological: Negative for weakness, light-headedness, numbness and headaches.  Hematological: Negative for adenopathy. Does not bruise/bleed easily.  Psychiatric/Behavioral: Negative for dysphoric mood. The patient is not nervous/anxious.         Objective:   Physical  Exam  Constitutional: She appears well-developed and well-nourished. No distress.  Slim and well appearing   HENT:  Head: Normocephalic and atraumatic.  Right Ear: External ear normal.  Left Ear: External ear normal.  Mouth/Throat: Oropharynx is clear and moist.  Eyes: Conjunctivae and EOM are normal. Pupils are equal, round, and reactive to light. No scleral icterus.  Neck: Normal range of motion. Neck supple. No JVD present. Carotid bruit is not present. No thyromegaly present.  Cardiovascular: Normal rate, regular rhythm, normal heart sounds and intact distal pulses.  Exam reveals no gallop.   Pulmonary/Chest: Effort normal and breath sounds normal. No respiratory distress. She has no wheezes. She exhibits no tenderness.  Abdominal: Soft. Bowel sounds are normal. She exhibits no distension, no abdominal bruit and no mass. There is no tenderness.  Genitourinary: No breast swelling, tenderness, discharge or bleeding.  Breast exam: No mass, nodules, thickening, tenderness, bulging, retraction, inflamation, nipple discharge or skin changes noted.  No axillary or clavicular LA.      Musculoskeletal: Normal range of motion. She exhibits no edema or tenderness.  Lymphadenopathy:    She has no cervical adenopathy.  Neurological: She is alert. She has normal reflexes. No cranial nerve deficit. She exhibits normal muscle tone. Coordination normal.  Skin: Skin is warm and dry. No rash noted. No erythema. No pallor.  Few SK  Psychiatric: She has a normal mood and affect.          Assessment & Plan:   Problem List Items Addressed This Visit      Musculoskeletal and Integument   Osteoporosis    dexa recently in 2017 Mixed trend Pt with hx of endometrial ca and tx  On fosamax and tolerating it well No falls or fx  Disc need for calcium/ vitamin D/ wt bearing exercise and bone density test every 2 y to monitor Disc safety/ fracture risk in detail        Relevant Medications    alendronate (FOSAMAX) 70 MG tablet     Genitourinary   Endometrial cancer ([place])    Doing well s/p surgery and treatment  Sees gyn regularly          Other   Colon cancer screening    utd colonoscopy with no recall unless her oncologist (hx of endometrial ca) rec it  ifob kit today No c/o      Relevant Orders   Fecal occult blood, imunochemical   Encounter for Medicare annual wellness exam - Primary    Reviewed health habits including diet and exercise and skin cancer prevention Reviewed appropriate screening tests for age  Also reviewed health mt list, fam hx and immunization status , as well as social and family history   See HPI Labs reviewed Please  do ifob stool kit for colon screening  Labs are stable  Good cholesterol   Take care of yourself  Stay physically and mentally active Try to get in some more calories with healthy snacks - you are still underweight       Routine general medical examination at a health care facility    Reviewed health habits including diet and exercise and skin cancer prevention Reviewed appropriate screening tests for age  Also reviewed health mt list, fam hx and immunization status , as well as social and family history   See HPI Labs reviewed Please do ifob stool kit for colon screening  Labs are stable  Good cholesterol   Take care of yourself  Stay physically and mentally active Try to get in some more calories with healthy snacks - you are still underweight

## 2015-09-20 NOTE — Progress Notes (Signed)
Pre visit review using our clinic review tool, if applicable. No additional management support is needed unless otherwise documented below in the visit note. 

## 2015-09-20 NOTE — Assessment & Plan Note (Signed)
Reviewed health habits including diet and exercise and skin cancer prevention Reviewed appropriate screening tests for age  Also reviewed health mt list, fam hx and immunization status , as well as social and family history   See HPI Labs reviewed Please do ifob stool kit for colon screening  Labs are stable  Good cholesterol   Take care of yourself  Stay physically and mentally active Try to get in some more calories with healthy snacks - you are still underweight 

## 2015-09-20 NOTE — Assessment & Plan Note (Signed)
Reviewed health habits including diet and exercise and skin cancer prevention Reviewed appropriate screening tests for age  Also reviewed health mt list, fam hx and immunization status , as well as social and family history   See HPI Labs reviewed Please do ifob stool kit for colon screening  Labs are stable  Good cholesterol   Take care of yourself  Stay physically and mentally active Try to get in some more calories with healthy snacks - you are still underweight

## 2015-09-20 NOTE — Assessment & Plan Note (Signed)
utd colonoscopy with no recall unless her oncologist (hx of endometrial ca) rec it  ifob kit today No c/o

## 2015-09-20 NOTE — Patient Instructions (Addendum)
Please do ifob stool kit for colon screening  Labs are stable  Good cholesterol   Take care of yourself  Stay physically and mentally active Try to get in some more calories with healthy snacks

## 2015-09-20 NOTE — Assessment & Plan Note (Signed)
Doing well s/p surgery and treatment  Sees gyn regularly

## 2015-09-20 NOTE — Assessment & Plan Note (Signed)
dexa recently in 2017 Mixed trend Pt with hx of endometrial ca and tx  On fosamax and tolerating it well No falls or fx  Disc need for calcium/ vitamin D/ wt bearing exercise and bone density test every 2 y to monitor Disc safety/ fracture risk in detail

## 2015-10-10 IMAGING — MG STANDARD SCREENING - COMBO
8 series · 8 of 24 positions shown · non-contrast
Comparison: Previous exam(s).

CLINICAL DATA: Screening.

EXAM:
DIGITAL SCREENING BILATERAL MAMMOGRAM WITH CAD
DIGITAL BREAST TOMOSYNTHESIS
Digital breast tomosynthesis images are acquired in two projections.
These images are reviewed in combination with the digital mammogram,
confirming the findings below.

[R MLO]
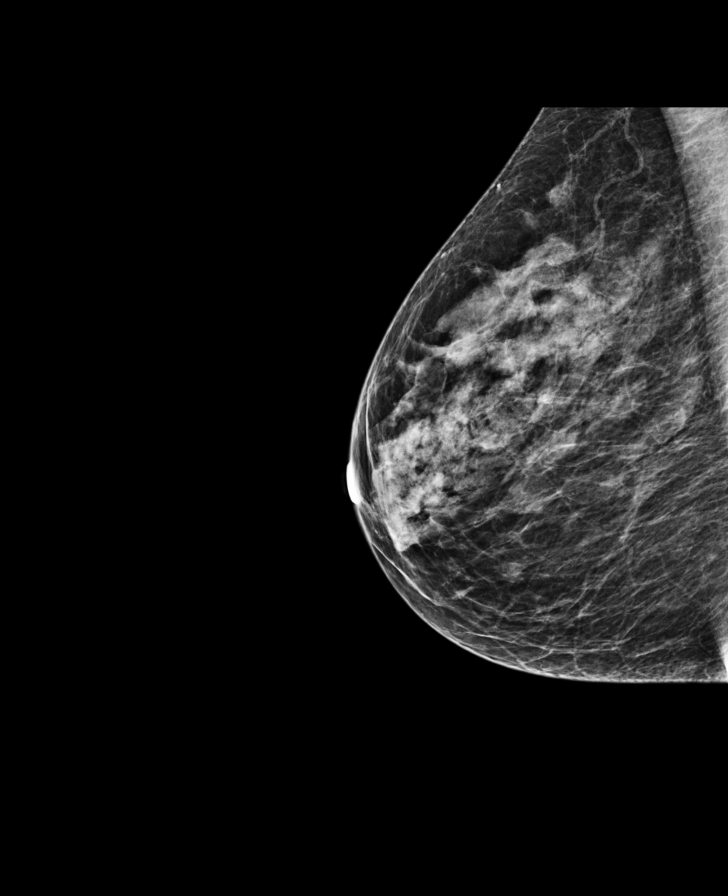

[L MLO]
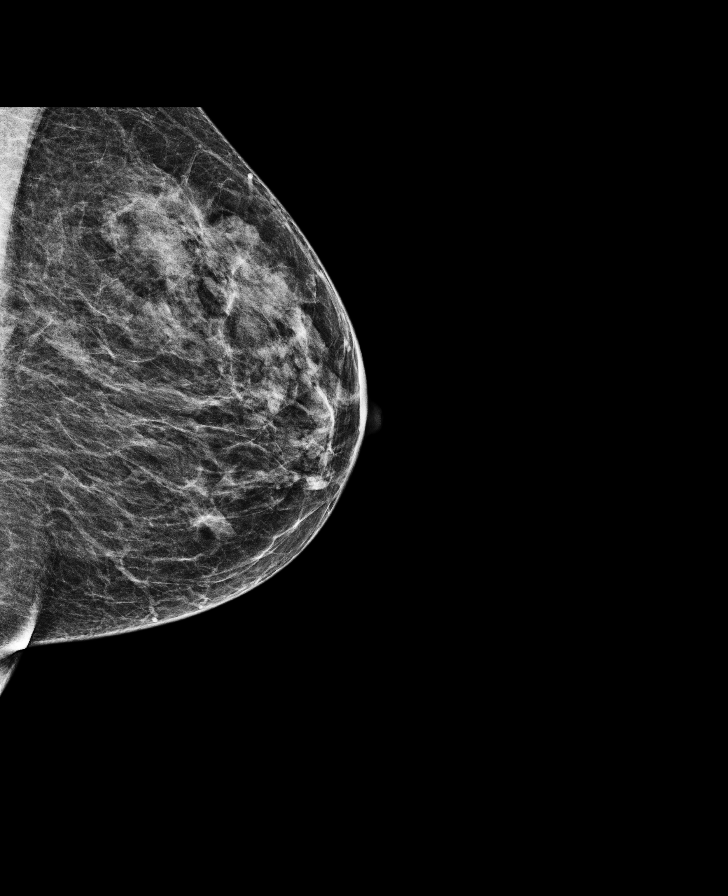

[R CC]
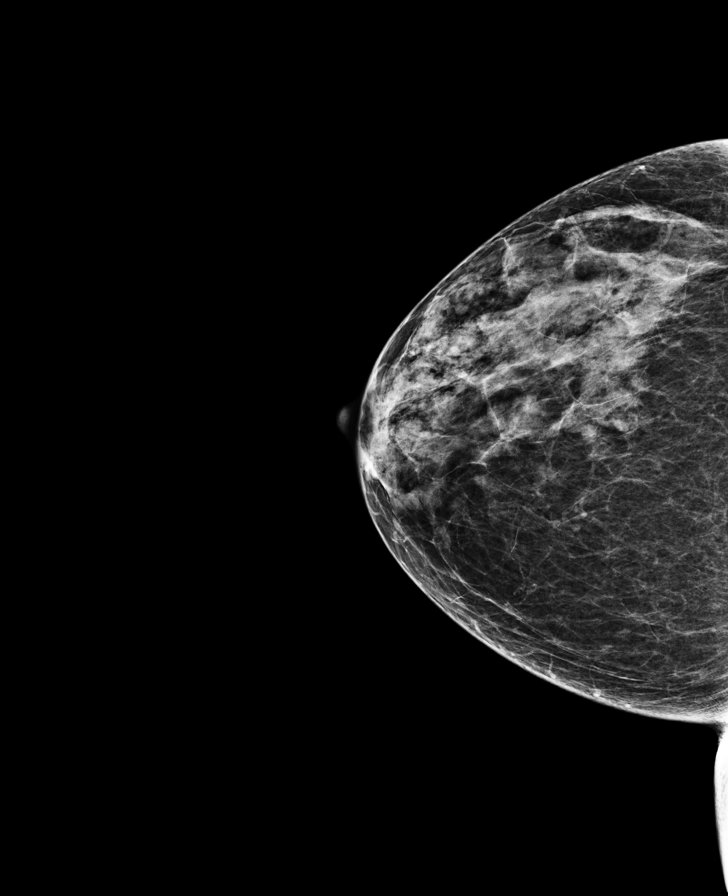

[L CC]
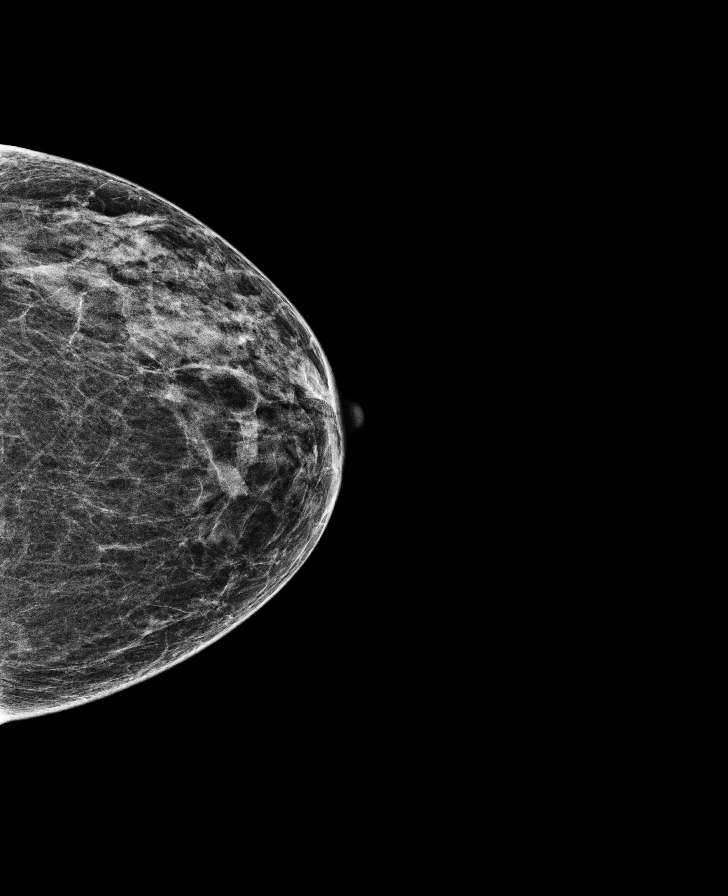

[L MLO tomo · tomo slice 21/41.0]
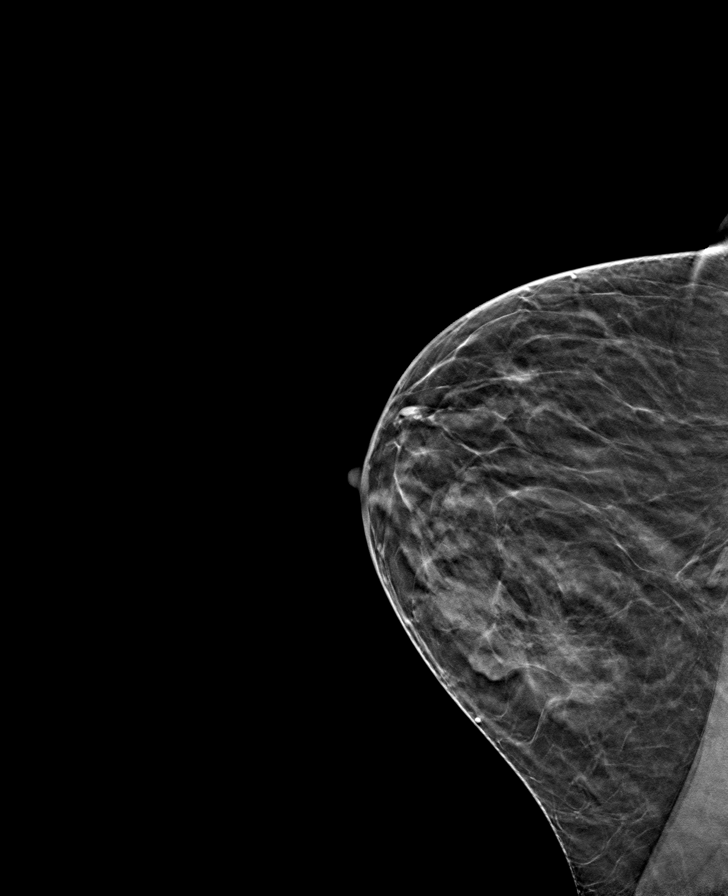

[R MLO tomo · tomo slice 21/41.0]
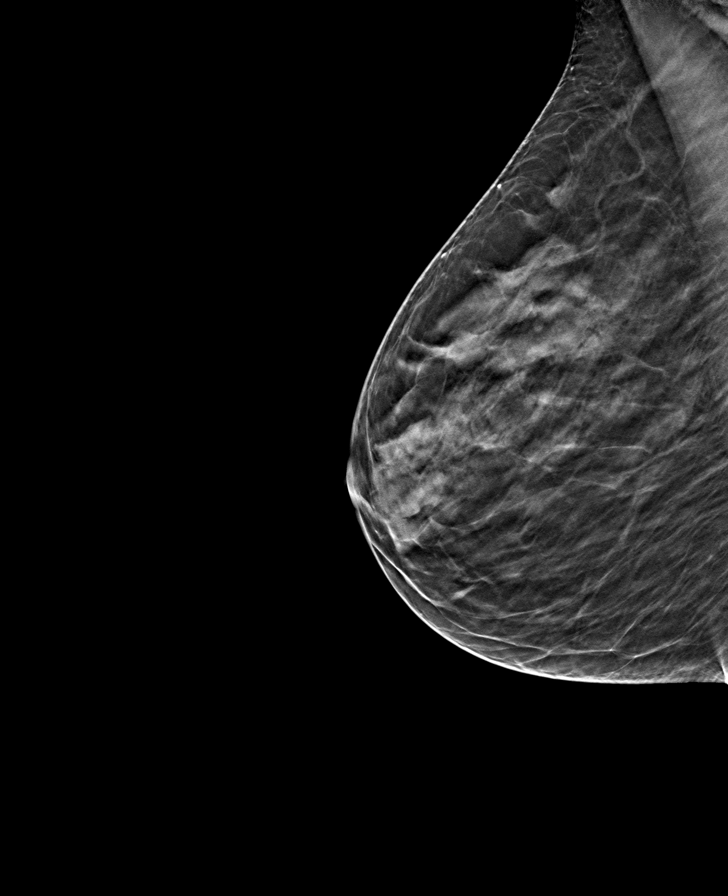

[R CC tomo · tomo slice 22/43.0]
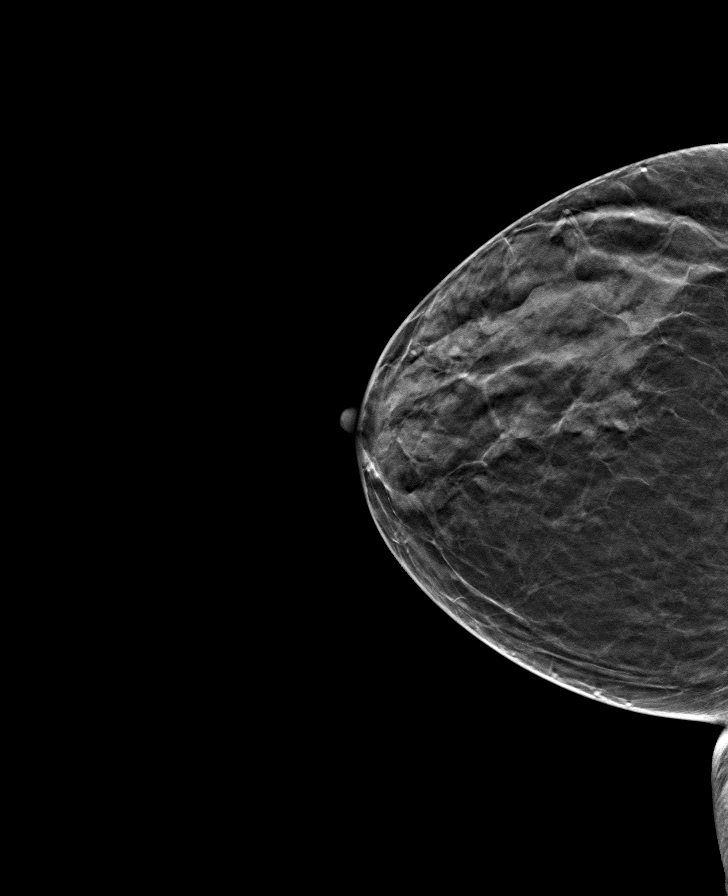

[L CC tomo · tomo slice 23/44.0]
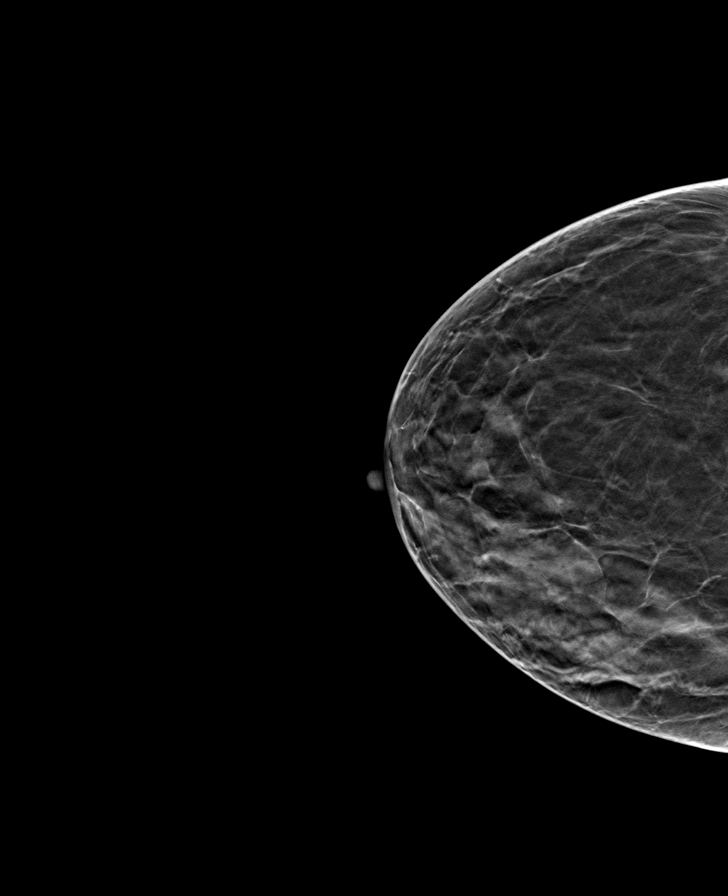

[8 of 24 positions shown; findings below may reference images not displayed]

ACR Breast Density Category c: The breasts are heterogeneously
dense, which may obscure small masses.
FINDINGS: There are no findings suspicious for malignancy. Images were
processed with CAD.
IMPRESSION: No mammographic evidence of malignancy. A result letter of this
screening mammogram will be mailed directly to the patient.

RECOMMENDATION:
Screening mammogram in one year. (Code:R9-A-IFR)

BI-RADS CATEGORY  1: Negative

## 2015-11-23 IMAGING — CR DG WRIST COMPLETE 3+V*L*
4 series · 4 of 4 positions shown · non-contrast
Comparison: None.

CLINICAL DATA: Pain

EXAM:
LEFT WRIST - COMPLETE 3+ VIEW

[view not recorded (1 of 4)]
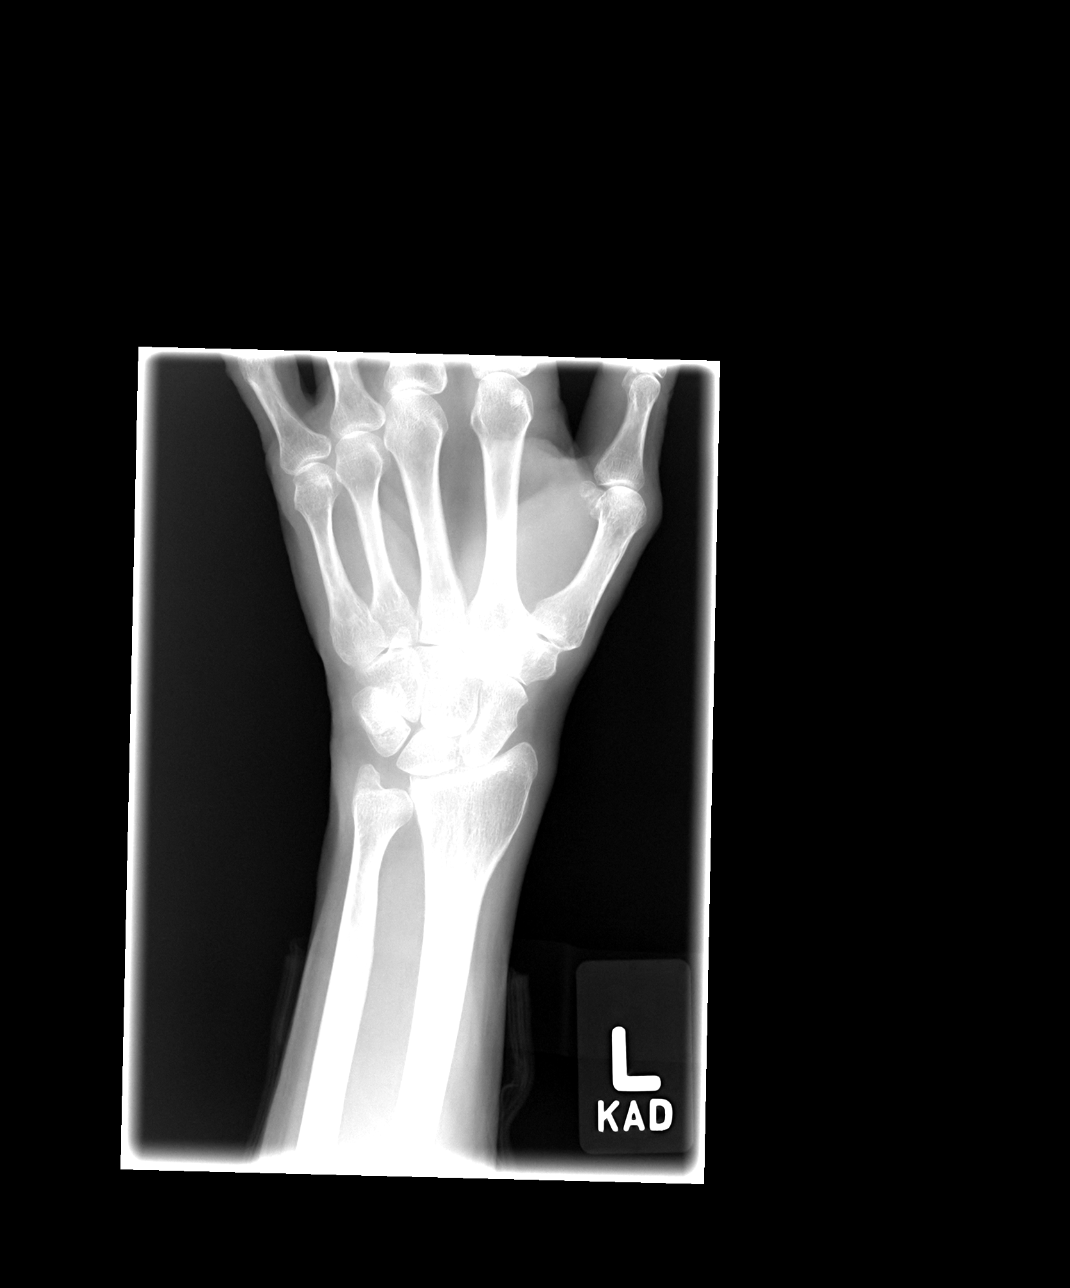

[view not recorded (2 of 4)]
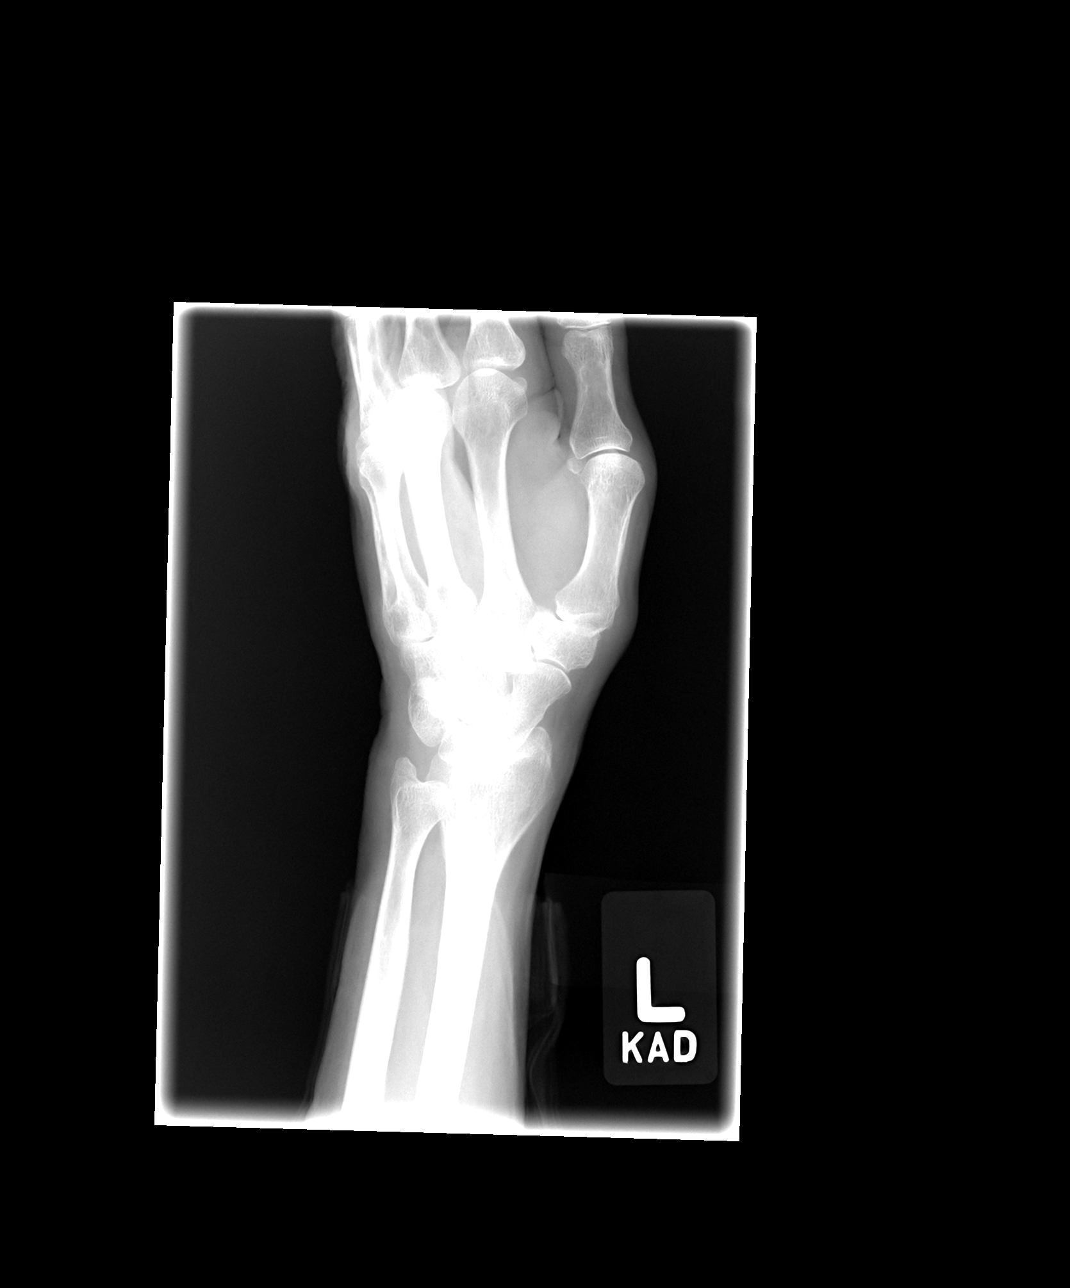

[view not recorded (3 of 4)]
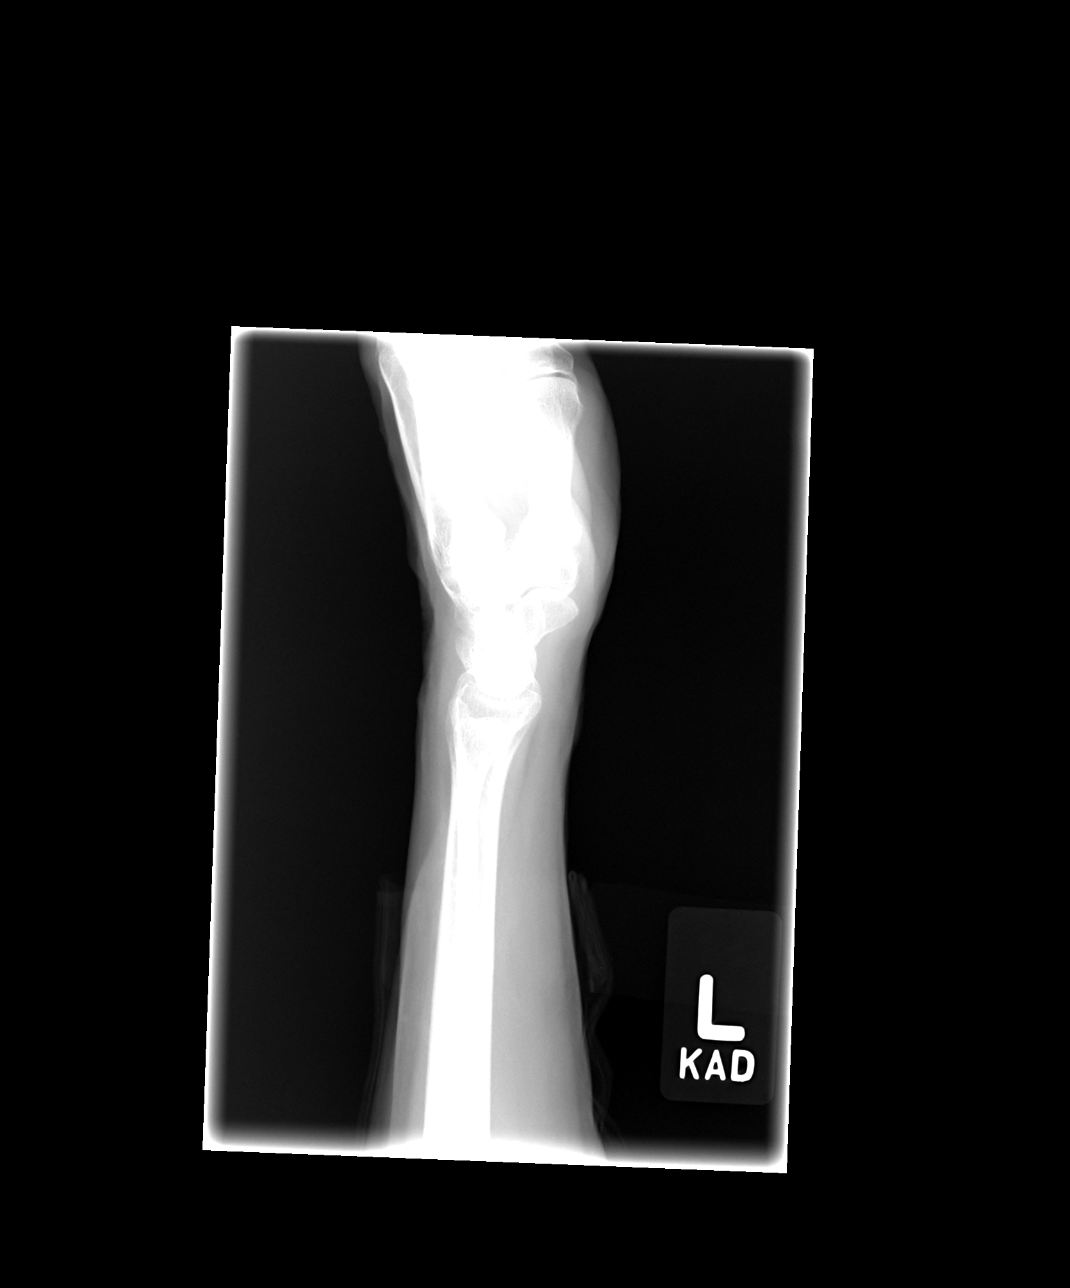

[view not recorded (4 of 4)]
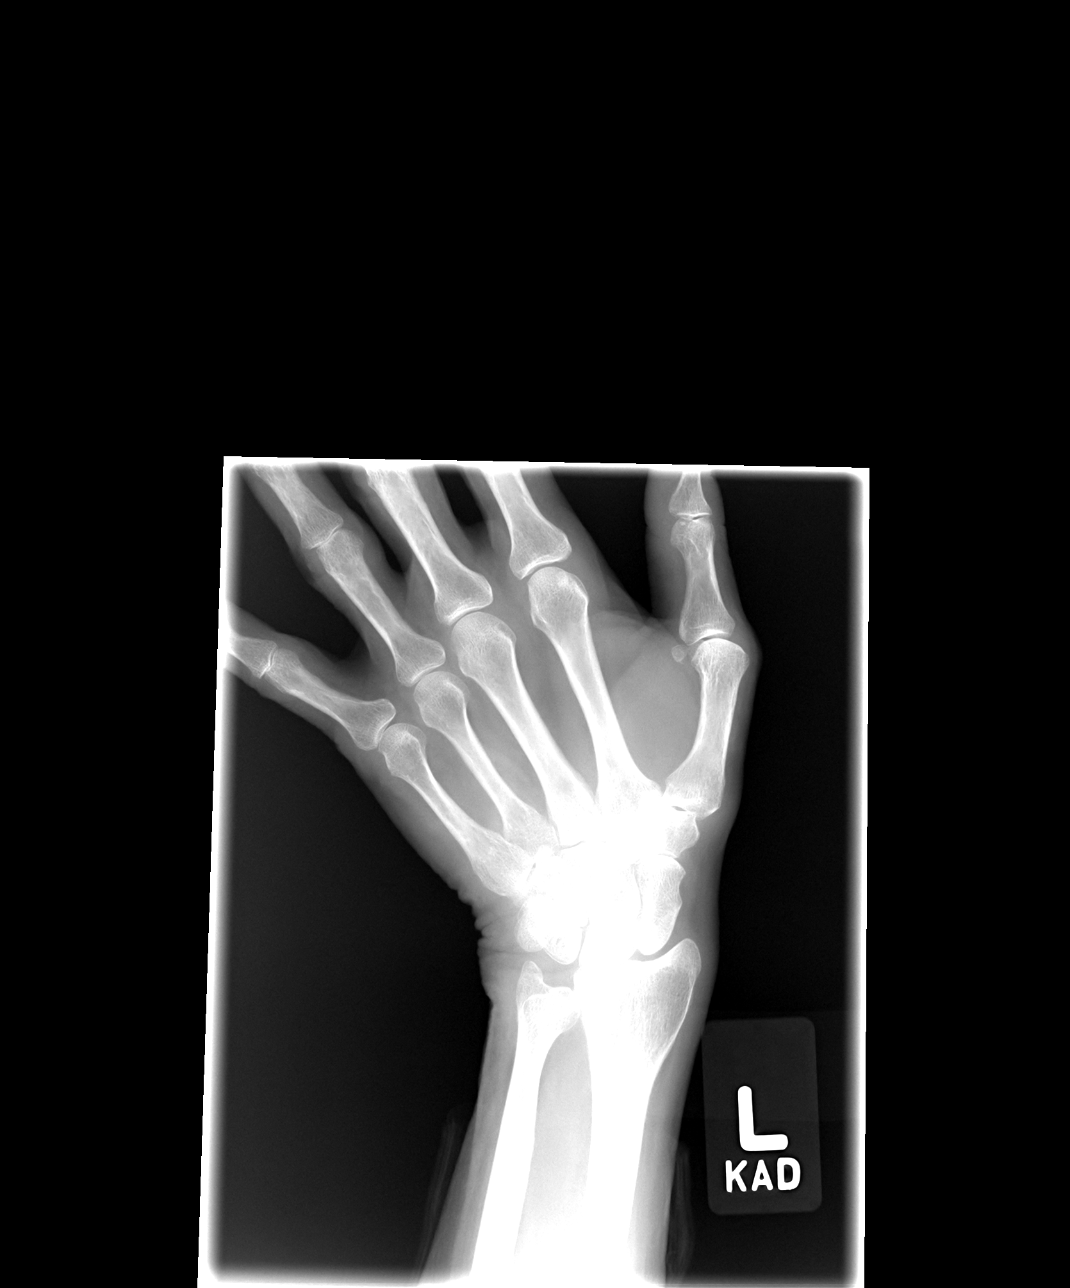

[4 of 4 positions shown; findings below may reference images not displayed]

FINDINGS: There is no evidence of fracture or dislocation. There is no
evidence of arthropathy or other focal bone abnormality. Soft
tissues are unremarkable.
IMPRESSION: Negative if there is persistent clinical concern further evaluation
with non contrasted MRI is recommended.

## 2015-12-09 ENCOUNTER — Ambulatory Visit: Payer: Medicare Other | Admitting: Radiation Oncology

## 2015-12-15 ENCOUNTER — Encounter: Payer: Self-pay | Admitting: Radiation Oncology

## 2015-12-15 ENCOUNTER — Ambulatory Visit
Admission: RE | Admit: 2015-12-15 | Discharge: 2015-12-15 | Disposition: A | Discharge status: Home / Self Care | Payer: Medicare Other | Source: Ambulatory Visit | Attending: Radiation Oncology | Admitting: Radiation Oncology

## 2015-12-15 VITALS — BP 110/67 | HR 71 | Temp 98.2°F | Ht 62.75 in | Wt 101.5 lb

## 2015-12-15 DIAGNOSIS — C541 Malignant neoplasm of endometrium: Secondary | ICD-10-CM | POA: Diagnosis present

## 2015-12-15 DIAGNOSIS — Z923 Personal history of irradiation: Secondary | ICD-10-CM | POA: Diagnosis not present

## 2015-12-15 NOTE — Progress Notes (Signed)
[name] here for follow up.  She reports having twitching 2-3 times a day in her right upper outer thigh.  She said it started a few months ago and said it is annoying, especially if it happens at night.  She denies having any bladder issues, bowels issues, rectal/vaginal bleeding or discharge.  She reports having a little fatigue but she has not been sleeping well. She has not been using her vaginal dilator but is sexually active.    BP 110/67 mmHg  Pulse 71  Temp(Src) 98.2 F (36.8 C) (Oral)  Ht 5' 2.75" (1.594 m)  Wt 101 lb 8 oz (46.04 kg)  BMI 18.12 kg/m2  SpO2 100%  LMP 09/17/2000   Wt Readings from Last 3 Encounters:  12/15/15 101 lb 8 oz (46.04 kg)  09/20/15 105 lb 8 oz (47.854 kg)  09/03/15 107 lb 1.6 oz (48.58 kg)

## 2015-12-15 NOTE — Progress Notes (Signed)
Radiation Oncology         [phone] ________________________________  Name: Veronica Ward MRN: [id number]  Date: 12/15/2015  DOB: 01-Nov-1945  Follow-Up Visit Note  CC: [name], MD  Marti Sleigh,*   Diagnosis:  Endometrial cancer, serous carcinoma , stage IA  Interval Since Last Radiation: 1 year and 11 months  May 19, May 26th, June 2, June 9, January 06, 2014: Proximal vagina, 30 gray in 5 fractions, 4.0 cm treatment length, vaginal brachytherapy  Narrative:  The patient returns today for routine follow-up. The patient saw Dr. Fermin Schwab on 09/03/15 and Pap smear collected at that time that was negative for malignancy.  She reports having twitching 2-3 times a day in her right upper outer thigh. She said it started a few months ago and said it is annoying, especially if it happens at night. She denies having any bladder/bowel issues or rectal/vaginal bleeding or discharge. She denies pelvic or lower back pain. She reports having a little fatigue, but she has not been sleeping well. She has not been using her vaginal dilator, but is sexually active.  ALLERGIES:  is allergic to bactrim; benadryl; oxycodone-acetaminophen; and sulfa antibiotics.  Meds: Current Outpatient Prescriptions  Medication Sig Dispense Refill  . alendronate (FOSAMAX) 70 MG tablet TAKE 1 TABLET BY MOUTH 30 MINUTES BEFOREBREAKFAST ONCE A WEEK WITH ATLEAST 8 OZ. OF WATER. 12 tablet 3  . CALCIUM PO Take 1,000 mg by mouth daily.    . cholecalciferol (VITAMIN D) 400 UNITS TABS Take 400 Units by mouth daily.     Marland Kitchen co-enzyme Q-10 30 MG capsule Take 30 mg by mouth 3 (three) times daily.    . fish oil-omega-3 fatty acids 1000 MG capsule Take by mouth 2 (two) times daily.     . folic acid (FOLVITE) 1 MG tablet Take 1 mg by mouth daily.    . Lactobacillus (CVS PROBIOTIC ACIDOPHILUS) 10 MG CAPS Take 1 capsule by mouth daily.     . Multiple Vitamin (MULTIVITAMIN) tablet Take 1 tablet by mouth daily.    Marland Kitchen  pyridOXINE (VITAMIN B-6) 100 MG tablet Take 100 mg by mouth daily.    . vitamin B-12 (CYANOCOBALAMIN) 100 MCG tablet Take 100 mcg by mouth daily.    . Ascorbic Acid (VITAMIN C) 100 MG tablet Take 100 mg by mouth daily. Reported on 12/15/2015    . ibuprofen (ADVIL,MOTRIN) 200 MG tablet Take 200 mg by mouth as needed for pain. Reported on 12/15/2015    . magnesium 30 MG tablet Take 30 mg by mouth daily. Reported on 12/15/2015     No current facility-administered medications for this encounter.    Physical Findings: The patient is in no acute distress. Patient is alert and oriented.  height is 5' 2.75" (1.594 m) and weight is 101 lb 8 oz (46.04 kg). Her oral temperature is 98.2 F (36.8 C). Her blood pressure is 110/67 and her pulse is 71. Her oxygen saturation is 100%.   The lungs are clear to auscultation. The heart has regular rhythm and rate. The abdomen is soft nontender with normal bowel sounds. No inguinal adenopathy appreciated. On pelvic examination the external genitalia are unremarkable. A speculum exam is performed. There are no mucosal lesions noted in the vaginal vault. On bimanual and rectovaginal examination no pelvic masses were appreciated.   Lab Findings: Lab Results  Component Value Date   WBC 4.6 09/14/2015   HGB 12.6 09/14/2015   HCT 37.0 09/14/2015   MCV 96.8 09/14/2015  PLT 185.0 09/14/2015    Radiographic Findings: No results found.  Impression:  No evidence recurrence on clinical exam today.   Plan: She will see Dr. Fermin Schwab in 3 months and she should f/u with rad/onc in 6 months. ____________________________________  Blair Promise, PhD, MD  This document serves as a record of services personally performed by Gery Pray, MD. It was created on his behalf by Darcus Austin, a trained medical scribe. The creation of this record is based on the scribe's personal observations and the provider's statements to them. This document has been checked and approved  by the attending provider.

## 2015-12-17 NOTE — Addendum Note (Signed)
Encounter addended by: Jacqulyn Liner, RN on: 12/17/2015  7:36 AM<BR>     Documentation filed: Charges VN

## 2016-01-06 ENCOUNTER — Encounter: Payer: Self-pay | Admitting: Family Medicine

## 2016-01-14 ENCOUNTER — Telehealth: Payer: Self-pay

## 2016-01-14 NOTE — Telephone Encounter (Signed)
Pt left v/m; pt having twitch or spasm in upper leg on and off for months; no pain. Happening more frequently; Pt does not want to schedule appt until sends note to Dr Glori Bickers for her comment. Pt request cb or email.

## 2016-01-14 NOTE — Telephone Encounter (Signed)
If it continues or worsens come in for an appointment.   Usually muscle twitches go away with time however.  Make sure you are eating a balanced diet with enough calcium and vitamin D. Also do some stretching.  Alert me if any weakness or numbness

## 2016-01-14 NOTE — Telephone Encounter (Signed)
Pt notified of Dr. Tower's comments and verbalized understanding  

## 2016-01-26 ENCOUNTER — Ambulatory Visit (INDEPENDENT_AMBULATORY_CARE_PROVIDER_SITE_OTHER): Payer: Medicare Other | Admitting: Family Medicine

## 2016-01-26 ENCOUNTER — Encounter: Payer: Self-pay | Admitting: Family Medicine

## 2016-01-26 VITALS — BP 102/68 | HR 73 | Temp 98.9°F | Wt 104.8 lb

## 2016-01-26 DIAGNOSIS — R4589 Other symptoms and signs involving emotional state: Secondary | ICD-10-CM | POA: Diagnosis not present

## 2016-01-26 DIAGNOSIS — R5383 Other fatigue: Secondary | ICD-10-CM | POA: Insufficient documentation

## 2016-01-26 DIAGNOSIS — R5382 Chronic fatigue, unspecified: Secondary | ICD-10-CM | POA: Diagnosis not present

## 2016-01-26 NOTE — Progress Notes (Signed)
Subjective:    Patient ID: Veronica Ward, female    DOB: 1946-04-21, 70 y.o.   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  Also frustrated with herself  Also the world and politics   Not dancing- that bothers her - her groups are currently not going   Last counseling was a year ago - with Pervis Hocking    For example- took a trip to [place] - walked up to 6 mi per day One of those days- she stayed in the room due to fatigue   occ stomach is a bit queasy or bloated   Does not feel refreshed after sleep - even after a good amount of sleep  Does not snore that she knows of Is thin   No uti symptoms  No fever Not aware of any tick bites   Does admit she takes on more than most people her age    Patient Active Problem List   Diagnosis Date Noted  . Fatigue 01/26/2016  . Estrogen deficiency 07/21/2015  . Acute bronchitis 06/25/2015  . Left shoulder pain 09/29/2014  . Ashkenazi Jewish ancestry   . Endometrial cancer ([place]) 11/03/2013  . CIS (carcinoma in situ of cervix) 09/23/2013  . Encounter for Medicare annual wellness exam 03/12/2013  . Colon cancer screening 03/12/2013  . Routine general medical examination at a health care facility 03/03/2013  . Encounter for medication monitoring 11/23/2010  . SEBORRHEIC KERATOSIS 08/25/2009  . STRESS REACTION, ACUTE, WITH EMOTIONAL DISTURBANCE  12/09/2008  . SLEEP DISORDER 12/09/2008  . HEMORRHOIDS 10/12/2008  . Osteoporosis 10/08/2007  . BASAL CELL CARCINOMA, FACE 06/21/2007  . FIBROCYSTIC BREAST DISEASE 06/21/2007  . PSORIASIS 06/21/2007  . URTICARIA 06/21/2007  . BASAL CELL CARCINOMA, FACE 06/21/2007   Past Medical History  Diagnosis Date  . Hx of basal cell carcinoma     face  . Diffuse cystic mastopathy   . Headache(784.0)   . Unspecified hemorrhoids without mention of complication   . Other malaise and fatigue   . Disorder of bone and cartilage, unspecified   . Other psoriasis   . Other seborrheic keratosis   . Sleep disturbance, unspecified   . Predominant disturbance of emotions   . Pain in limb   . Urticaria, unspecified   . History of shingles   . Anxiety   . Anemia   . Dysautonomia     mild  . Osteoporosis   . Endometrial cancer ([place])   . Ashkenazi Jewish ancestry   . Radiation 01/06/14, 12/23/13, 12/16/13, 12/09/13    HDR brachytherapy   Past Surgical History  Procedure Laterality Date  . Cervical conization w/bx    . Cervical cancer  1973  . Breast biopsy Left 2001    benign  . Tubal ligation    . Dexa  03/2002    oseopenia  .  Colonoscopy  12/04    int. hemorrhoids  . Dexa  07/2004    decreased BMD, osteopenia  . Dexa  02/2007    Osteopenia  . Breast lumpectomy      left underarm  . Birthmark removal  childhood  . Dexa  9/10    Osteopenia slightly worse  . Felon i&d  12/2010    Dr. Fredna Dow, I&D in OR (L index finger)  . Finger surgery    . Knee surgery      right  . Robotic assisted total hysterectomy with bilateral salpingo oopherectomy  10/03/2013    Robotic-assisted total laparoscopic hysterectomy, bilateral salpingo-oophorectomy, bilateral pelvic and para-aortic lymphadenectomy with sentinel lymph node dissection  . Tonsilectomy, adenoidectomy, bilateral myringotomy and tubes    . Axillary lymph node dissection      left   Social History  Substance Use Topics  . Smoking status:  Former Smoker    Quit date: 07/25/1963  . Smokeless tobacco: Never Used  . Alcohol Use: 0.6 oz/week    0 Standard drinks or equivalent, 1 Glasses of wine per week     Comment: 1/2 glass of wine a day   Family History  Problem Relation Age of Onset  . Multiple myeloma Father   . Coronary artery disease Father   . Transient ischemic attack Mother   . Lupus Maternal Aunt   . Breast cancer Maternal Aunt     dx 37s; deceased  . Cancer Maternal Grandfather     GI cancer or pancreatic; deceased 36  . Colon cancer Neg Hx   . Ovarian cancer Maternal Aunt     dx 40s; deceased 13s  . Multiple sclerosis Brother    Allergies  Allergen Reactions  . Bactrim [Sulfamethoxazole-Trimethoprim] Rash  . Benadryl [Diphenhydramine Hcl (Sleep)] Rash and Anxiety    Fever, body ache  . Oxycodone-Acetaminophen Anxiety  . Sulfa Antibiotics Rash   Current Outpatient Prescriptions on File Prior to Visit  Medication Sig Dispense Refill  . alendronate (FOSAMAX) 70 MG tablet TAKE 1 TABLET BY MOUTH 30 MINUTES BEFOREBREAKFAST ONCE A WEEK WITH ATLEAST 8 OZ. OF WATER. 12 tablet 3  . Ascorbic Acid (VITAMIN C) 100 MG tablet Take 100 mg by mouth daily. Reported on 12/15/2015    . CALCIUM PO Take 1,000 mg by mouth daily.    . cholecalciferol (VITAMIN D) 400 UNITS TABS Take 400 Units by mouth daily.     Marland Kitchen co-enzyme Q-10 30 MG capsule Take 30 mg by mouth 3 (three) times daily.    . fish oil-omega-3 fatty acids 1000 MG capsule Take by mouth 2 (two) times daily.     . folic acid (FOLVITE) 1 MG tablet Take 1 mg by mouth daily.    Marland Kitchen ibuprofen (ADVIL,MOTRIN) 200 MG tablet Take 200 mg by mouth as needed for pain. Reported on 12/15/2015    . Lactobacillus ([employer] PROBIOTIC ACIDOPHILUS) 10 MG CAPS Take 1 capsule by mouth daily.     . magnesium 30 MG tablet Take 30 mg by mouth daily. Reported on 12/15/2015    . Multiple Vitamin (MULTIVITAMIN) tablet Take 1 tablet by mouth daily.    Marland Kitchen pyridOXINE (VITAMIN B-6) 100 MG tablet Take 100  mg by mouth daily.    . vitamin B-12 (CYANOCOBALAMIN) 100 MCG tablet Take 100 mcg by mouth daily.     No current facility-administered medications on file prior to visit.    Review of Systems Review of Systems  Constitutional: Negative for fever, appetite change,  and unexpected weight change. pos for fatigue/malaise  Eyes: Negative for pain and visual disturbance.  Respiratory: Negative for cough and shortness of breath.   Cardiovascular: Negative for cp or palpitations    Gastrointestinal: Negative for nausea, diarrhea and constipation.  Genitourinary: Negative for urgency and frequency.  Skin: Negative for pallor or rash   Neurological: Negative for weakness, light-headedness, numbness and headaches.  Hematological: Negative for adenopathy. Does not bruise/bleed easily.  Psychiatric/Behavioral: pos for dysphoric mood and lack of motivation and slt anxiety, neg for SI       Objective:   Physical Exam  Constitutional: She appears well-developed and well-nourished. No distress.  Slim and well appearing   HENT:  Head: Normocephalic and atraumatic.  Nose: Nose normal.  Mouth/Throat: Oropharynx is clear and moist.  Eyes: Conjunctivae and EOM are normal. Pupils are equal, round, and reactive to light. Right eye exhibits no discharge. Left eye exhibits no discharge. No scleral icterus.  Neck: Normal range of motion. Neck supple. No JVD present. Carotid bruit is not present. No thyromegaly present.  Cardiovascular: Normal rate, regular rhythm, normal heart sounds and intact distal pulses.  Exam reveals no gallop.   Pulmonary/Chest: Effort normal and breath sounds normal. No respiratory distress. She has no wheezes. She has no rales.  Abdominal: Soft. Bowel sounds are normal. She exhibits no distension and no mass. There is no tenderness.  Musculoskeletal: She exhibits no edema or tenderness.  Lymphadenopathy:    She has no cervical adenopathy.  Neurological: She is alert. She has normal  reflexes. No cranial nerve deficit. She exhibits normal muscle tone. Coordination normal.  Skin: Skin is warm and dry. No rash noted. No erythema. No pallor.  Psychiatric: Her speech is normal and behavior is normal. Judgment normal. Her mood appears not anxious. Her affect is not labile and not inappropriate. She is not agitated, not slowed and not withdrawn. Thought content is not paranoid. She expresses no homicidal and no suicidal ideation.  Pleasant and talkative -not overtly depressed but seemingly fatigued and stressed  Overall fairly good insight           Assessment & Plan:   Problem List Items Addressed This Visit      Other   STRESS REACTION, ACUTE, WITH EMOTIONAL DISTURBANCE    Long disc re: this and relationship to physical fatigue and lack of motivation  Reviewed stressors/ coping techniques/symptoms/ support sources/ tx options and side effects in detail today  Stressors incl work/family/worries about her own cancer  Suggest meditation/exercise and self care Ref for counseling as well (has seen Pervis Hocking in the past)  Update if no improvement >25 minutes spent in face to face time with patient, >50% spent in counselling or coordination of care       Relevant Orders   Ambulatory referral to Psychology   Fatigue - Primary    Pt c/o generalized fatigue and lack of motivation in a cancer survivor Has stressors and a loaded schedule -finds it hard to find time for self care  Suspect this may be related to mild depression or dysthymia  Will get labs today as well         Relevant Orders   CBC with Differential/Platelet   TSH   Comprehensive metabolic panel   Vitamin Y04

## 2016-01-26 NOTE — Patient Instructions (Signed)
Give yourself permission to take time for yourself  Keep walking Try meditating  Stop at check out for referral to [facility] today

## 2016-01-27 ENCOUNTER — Encounter: Payer: Self-pay | Admitting: Family Medicine

## 2016-01-27 LAB — CBC WITH DIFFERENTIAL/PLATELET
Basophils Absolute: 0.1 10*3/uL (ref 0.0–0.1)
Basophils Relative: 1.3 % (ref 0.0–3.0)
Eosinophils Absolute: 0.1 10*3/uL (ref 0.0–0.7)
Eosinophils Relative: 1.9 % (ref 0.0–5.0)
HCT: 35.9 % — ABNORMAL LOW (ref 36.0–46.0)
Hemoglobin: 12.2 g/dL (ref 12.0–15.0)
Lymphocytes Relative: 20.5 % (ref 12.0–46.0)
Lymphs Abs: 0.9 10*3/uL (ref 0.7–4.0)
MCHC: 34.1 g/dL (ref 30.0–36.0)
MCV: 96.4 fl (ref 78.0–100.0)
Monocytes Absolute: 0.6 10*3/uL (ref 0.1–1.0)
Monocytes Relative: 13.9 % — ABNORMAL HIGH (ref 3.0–12.0)
Neutro Abs: 2.7 10*3/uL (ref 1.4–7.7)
Neutrophils Relative %: 62.4 % (ref 43.0–77.0)
Platelets: 156 10*3/uL (ref 150.0–400.0)
RBC: 3.72 Mil/uL — ABNORMAL LOW (ref 3.87–5.11)
RDW: 13.2 % (ref 11.5–15.5)
WBC: 4.3 10*3/uL (ref 4.0–10.5)

## 2016-01-27 LAB — COMPREHENSIVE METABOLIC PANEL
ALT: 13 U/L (ref 0–35)
AST: 20 U/L (ref 0–37)
Albumin: 4.2 g/dL (ref 3.5–5.2)
Alkaline Phosphatase: 52 U/L (ref 39–117)
BUN: 14 mg/dL (ref 6–23)
CO2: 30 mEq/L (ref 19–32)
Calcium: 9.9 mg/dL (ref 8.4–10.5)
Chloride: 102 mEq/L (ref 96–112)
Creatinine, Ser: 0.65 mg/dL (ref 0.40–1.20)
GFR: 95.23 mL/min (ref >60.00)
Glucose, Bld: 103 mg/dL — ABNORMAL HIGH (ref 70–99)
Potassium: 3.9 mEq/L (ref 3.5–5.1)
Sodium: 137 mEq/L (ref 135–145)
Total Bilirubin: 0.3 mg/dL (ref 0.2–1.2)
Total Protein: 7 g/dL (ref 6.0–8.3)

## 2016-01-27 LAB — VITAMIN B12: Vitamin B-12: >1500 pg/mL — ABNORMAL HIGH (ref 211–911)

## 2016-01-27 LAB — TSH: TSH: 2.63 u[IU]/mL (ref 0.35–4.50)

## 2016-01-27 NOTE — Assessment & Plan Note (Signed)
Pt c/o generalized fatigue and lack of motivation in a cancer survivor Has stressors and a loaded schedule -finds it hard to find time for self care  Suspect this may be related to mild depression or dysthymia  Will get labs today as well

## 2016-01-27 NOTE — Assessment & Plan Note (Signed)
Long disc re: this and relationship to physical fatigue and lack of motivation  Reviewed stressors/ coping techniques/symptoms/ support sources/ tx options and side effects in detail today  Stressors incl work/family/worries about her own cancer  Suggest meditation/exercise and self care Ref for counseling as well (has seen Pervis Hocking in the past)  Update if no improvement >25 minutes spent in face to face time with patient, >50% spent in counselling or coordination of care

## 2016-02-11 ENCOUNTER — Ambulatory Visit (INDEPENDENT_AMBULATORY_CARE_PROVIDER_SITE_OTHER): Payer: 59 | Admitting: Psychology

## 2016-02-11 DIAGNOSIS — F4323 Adjustment disorder with mixed anxiety and depressed mood: Secondary | ICD-10-CM | POA: Diagnosis not present

## 2016-02-25 ENCOUNTER — Ambulatory Visit (INDEPENDENT_AMBULATORY_CARE_PROVIDER_SITE_OTHER): Payer: 59 | Admitting: Psychology

## 2016-02-25 DIAGNOSIS — F4323 Adjustment disorder with mixed anxiety and depressed mood: Secondary | ICD-10-CM

## 2016-03-03 ENCOUNTER — Ambulatory Visit (INDEPENDENT_AMBULATORY_CARE_PROVIDER_SITE_OTHER): Payer: 59 | Admitting: Psychology

## 2016-03-03 DIAGNOSIS — F4323 Adjustment disorder with mixed anxiety and depressed mood: Secondary | ICD-10-CM | POA: Diagnosis not present

## 2016-03-06 ENCOUNTER — Encounter: Payer: Self-pay | Admitting: Gynecology

## 2016-03-06 ENCOUNTER — Ambulatory Visit: Payer: Medicare Other | Attending: Gynecology | Admitting: Gynecology

## 2016-03-06 ENCOUNTER — Other Ambulatory Visit (HOSPITAL_COMMUNITY): Admission: RE | Admit: 2016-03-06 | Payer: Medicare Other | Source: Ambulatory Visit | Admitting: Gynecology

## 2016-03-06 ENCOUNTER — Other Ambulatory Visit (HOSPITAL_COMMUNITY)
Admission: RE | Admit: 2016-03-06 | Discharge: 2016-03-06 | Disposition: A | Discharge status: Home / Self Care | Payer: Medicare Other | Source: Ambulatory Visit | Attending: Gynecology | Admitting: Gynecology

## 2016-03-06 VITALS — BP 122/68 | HR 78 | Temp 98.1°F | Resp 18 | Ht 62.75 in | Wt 104.8 lb

## 2016-03-06 DIAGNOSIS — Z881 Allergy status to other antibiotic agents status: Secondary | ICD-10-CM | POA: Insufficient documentation

## 2016-03-06 DIAGNOSIS — Z885 Allergy status to narcotic agent status: Secondary | ICD-10-CM | POA: Insufficient documentation

## 2016-03-06 DIAGNOSIS — Z8249 Family history of ischemic heart disease and other diseases of the circulatory system: Secondary | ICD-10-CM | POA: Diagnosis not present

## 2016-03-06 DIAGNOSIS — D649 Anemia, unspecified: Secondary | ICD-10-CM | POA: Diagnosis not present

## 2016-03-06 DIAGNOSIS — Z87891 Personal history of nicotine dependence: Secondary | ICD-10-CM | POA: Insufficient documentation

## 2016-03-06 DIAGNOSIS — Z923 Personal history of irradiation: Secondary | ICD-10-CM | POA: Diagnosis not present

## 2016-03-06 DIAGNOSIS — M899 Disorder of bone, unspecified: Secondary | ICD-10-CM | POA: Insufficient documentation

## 2016-03-06 DIAGNOSIS — Z888 Allergy status to other drugs, medicaments and biological substances status: Secondary | ICD-10-CM | POA: Diagnosis not present

## 2016-03-06 DIAGNOSIS — Z803 Family history of malignant neoplasm of breast: Secondary | ICD-10-CM | POA: Diagnosis not present

## 2016-03-06 DIAGNOSIS — M810 Age-related osteoporosis without current pathological fracture: Secondary | ICD-10-CM | POA: Insufficient documentation

## 2016-03-06 DIAGNOSIS — Z8589 Personal history of malignant neoplasm of other organs and systems: Secondary | ICD-10-CM | POA: Diagnosis not present

## 2016-03-06 DIAGNOSIS — C541 Malignant neoplasm of endometrium: Secondary | ICD-10-CM | POA: Insufficient documentation

## 2016-03-06 DIAGNOSIS — Z90710 Acquired absence of both cervix and uterus: Secondary | ICD-10-CM | POA: Diagnosis not present

## 2016-03-06 DIAGNOSIS — L409 Psoriasis, unspecified: Secondary | ICD-10-CM | POA: Insufficient documentation

## 2016-03-06 DIAGNOSIS — L821 Other seborrheic keratosis: Secondary | ICD-10-CM | POA: Diagnosis not present

## 2016-03-06 DIAGNOSIS — Z8542 Personal history of malignant neoplasm of other parts of uterus: Secondary | ICD-10-CM | POA: Diagnosis not present

## 2016-03-06 DIAGNOSIS — Z90722 Acquired absence of ovaries, bilateral: Secondary | ICD-10-CM | POA: Diagnosis not present

## 2016-03-06 DIAGNOSIS — G901 Familial dysautonomia [Riley-Day]: Secondary | ICD-10-CM | POA: Diagnosis not present

## 2016-03-06 DIAGNOSIS — Z01411 Encounter for gynecological examination (general) (routine) with abnormal findings: Secondary | ICD-10-CM | POA: Insufficient documentation

## 2016-03-06 DIAGNOSIS — N6019 Diffuse cystic mastopathy of unspecified breast: Secondary | ICD-10-CM | POA: Diagnosis not present

## 2016-03-06 DIAGNOSIS — Z882 Allergy status to sulfonamides status: Secondary | ICD-10-CM | POA: Insufficient documentation

## 2016-03-06 DIAGNOSIS — Z800 Family history of malignant neoplasm of digestive organs: Secondary | ICD-10-CM | POA: Insufficient documentation

## 2016-03-06 DIAGNOSIS — Z9221 Personal history of antineoplastic chemotherapy: Secondary | ICD-10-CM | POA: Insufficient documentation

## 2016-03-06 DIAGNOSIS — Z8041 Family history of malignant neoplasm of ovary: Secondary | ICD-10-CM | POA: Diagnosis not present

## 2016-03-06 DIAGNOSIS — G479 Sleep disorder, unspecified: Secondary | ICD-10-CM | POA: Insufficient documentation

## 2016-03-06 NOTE — Patient Instructions (Signed)
We will contact you with the results of your pap smear from today.  Plan to follow up with Dr. Sondra Come as scheduled and Dr. Fermin Schwab in six months or sooner if needed.  Please call our office at [phone] after you see Dr. Sondra Come to schedule your appointment for Feb 2018.

## 2016-03-06 NOTE — Addendum Note (Signed)
Addended by: Joylene John D on: 03/06/2016 01:46 PM   Modules accepted: Orders

## 2016-03-06 NOTE — Progress Notes (Signed)
Consult Note: Gyn-Onc   Veronica Ward 70 y.o. female  Chief Complaint  Patient presents with  . Endometrial Cancer    Follow up    Assessment : Endometrial carcinoma (papillary serous) stage I a (March 2015). Status post 6 cycles of carboplatin Taxol chemotherapy and vaginal vault brachytherapy. Patient is clinically free of disease.  Plan:  She will see Dr, Veronica Ward in 3 months. I plan on seeing the patient back in 6 months. Pap smears are obtained today she will continue to have annual mammograms.  Interval history: The patient returns today for continuing follow-up of endometrial carcinoma. Since her last visit with gynecologic oncology she's been seen by Dr. Sondra Ward. Today we she reports she's having no problems and her health is been good. She specifically denies any GI or GU symptoms no pelvic pain pressure vaginal bleeding or discharge. She has no neuropathy.She did have 1 day of spotting secondary to a hemorrhoid which resolved spontaneously.  HPI: 70 year old white female seen in consultation request of Dr. Mora Ward regarding management of a newly diagnosed endometrial cancer. Approximately 3 weeks ago the patient developed some vaginal spotting. She was seen by Dr. Elly Ward on February 25. An endometrial biopsy was obtained showing a grade 2 endometrial carcinoma. The patient is also had an ultrasound of the pelvis revealing a uterus measuring 7 x 3 x 3.7 cm. The endometrial stripe was 7 mm.  [name] She has no other gynecologic history.   Patient underwent robotic hysterectomy bilateral salpingo-oophorectomy and pelvic lymphadenectomy at [facility] on 10/03/2013. 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 Adjuvant therapy with carboplatin and Taxol and vaginal vault brachytherapy was  given completed in August 2015.   Review of Systems:10 point review of systems is negative except as noted in interval history.   Vitals: Blood pressure 122/68, pulse 78, temperature 98.1 F (36.7 C), temperature source Oral, resp. rate 18, height 5' 2.75" (1.594 m), weight 104 lb 12.8 oz (47.5 kg), last menstrual period 09/17/2000, SpO2 100 %.  Physical Exam: General : The patient is a healthy woman in no acute distress.  HEENT: normocephalic, extraoccular movements normal; neck is supple without thyromegally  Lynphnodes: Supraclavicular and inguinal nodes not enlarged  Abdomen: Soft, non-tender, no ascites, no organomegally, no masses, no hernias  Pelvic:  EGBUS: Normal female  Vagina: Normal, no lesions, atrophic   Urethra and Bladder: Normal, non-tender  Cervix: Surgically absent Uterus: Surgically absent Bi-manual examination: Non-tender; no adenxal masses or nodularity  Rectal: normal sphincter tone, no masses, no blood  Lower extremities: No edema or varicosities. Normal range of motion      Allergies  Allergen Reactions  . Bactrim [Sulfamethoxazole-Trimethoprim] Rash  . Benadryl [Diphenhydramine Hcl (Sleep)] Rash and Anxiety    Fever, body ache  . Oxycodone-Acetaminophen Anxiety  . Sulfa Antibiotics Rash    Past Medical History:  Diagnosis Date  . Anemia   . Anxiety   . Ashkenazi Jewish ancestry   . Diffuse cystic mastopathy   . Disorder of bone and cartilage, unspecified   . Dysautonomia    mild  . Endometrial cancer ([place])   . Headache(784.0)   . History of shingles   . Hx of basal cell carcinoma    face  . Osteoporosis   . Other malaise and fatigue   .  Other psoriasis   . Other seborrheic keratosis   . Pain in limb   . Predominant disturbance of emotions   . Radiation 01/06/14, 12/23/13, 12/16/13, 12/09/13   HDR brachytherapy  . Sleep disturbance, unspecified   . Unspecified hemorrhoids without mention of complication   . Urticaria, unspecified      Past Surgical History:  Procedure Laterality Date  . AXILLARY LYMPH NODE DISSECTION     left  . birthmark removal  childhood  . BREAST BIOPSY Left 2001   benign  . BREAST LUMPECTOMY     left underarm  . cervical cancer  1973  . CERVICAL CONIZATION W/BX    . COLONOSCOPY  12/04   int. hemorrhoids  . DEXA  03/2002   oseopenia  . DEXA  07/2004   decreased BMD, osteopenia  . DEXA  02/2007   Osteopenia  . DEXA  9/10   Osteopenia slightly worse  . felon I&D  12/2010   Veronica Ward, I&D in OR (L index finger)  . FINGER SURGERY    . KNEE SURGERY     right  . ROBOTIC ASSISTED TOTAL HYSTERECTOMY WITH BILATERAL SALPINGO OOPHERECTOMY  10/03/2013   Robotic-assisted total laparoscopic hysterectomy, bilateral salpingo-oophorectomy, bilateral pelvic and para-aortic lymphadenectomy with sentinel lymph node dissection  . TONSILECTOMY, ADENOIDECTOMY, BILATERAL MYRINGOTOMY AND TUBES    . TUBAL LIGATION      Current Outpatient Prescriptions  Medication Sig Dispense Refill  . alendronate (FOSAMAX) 70 MG tablet TAKE 1 TABLET BY MOUTH 30 MINUTES BEFOREBREAKFAST ONCE A WEEK WITH ATLEAST 8 OZ. OF WATER. 12 tablet 3  . Ascorbic Acid (VITAMIN C) 100 MG tablet Take 100 mg by mouth daily. Reported on 12/15/2015    . CALCIUM PO Take 1,000 mg by mouth daily.    . cholecalciferol (VITAMIN D) 400 UNITS TABS Take 400 Units by mouth daily.     Marland Kitchen co-enzyme Q-10 30 MG capsule Take 30 mg by mouth 3 (three) times daily.    . fish oil-omega-3 fatty acids 1000 MG capsule Take by mouth 2 (two) times daily.     . folic acid (FOLVITE) 1 MG tablet Take 1 mg by mouth daily.    Marland Kitchen ibuprofen (ADVIL,MOTRIN) 200 MG tablet Take 200 mg by mouth as needed for pain. Reported on 12/15/2015    . Lactobacillus (CVS PROBIOTIC ACIDOPHILUS) 10 MG CAPS Take 1 capsule by mouth daily.     . magnesium 30 MG tablet Take 30 mg by mouth daily. Reported on 12/15/2015    . Multiple Vitamin (MULTIVITAMIN) tablet Take 1 tablet by mouth daily.    Marland Kitchen  pyridOXINE (VITAMIN B-6) 100 MG tablet Take 100 mg by mouth daily.    . vitamin B-12 (CYANOCOBALAMIN) 100 MCG tablet Take 100 mcg by mouth daily.     No current facility-administered medications for this visit.     Social History   Social History  . Marital status: Married    Spouse name: N/A  . Number of children: 3  . Years of education: N/A   Occupational History  . Professor    Social History Main Topics  . Smoking status: Former Smoker    Quit date: 07/25/1963  . Smokeless tobacco: Never Used  . Alcohol use 0.6 oz/week    1 Glasses of wine per week     Comment: 1/2 glass of wine a day  . Drug use: No  . Sexual activity: Yes    Birth control/ protection: Post-menopausal   Other Topics Concern  .  Not on file   Social History Narrative   History professor      Likes to dance      Is a walker and does floor exercises      Caffeine: 2 cups daily    Family History  Problem Relation Age of Onset  . Multiple myeloma Father   . Coronary artery disease Father   . Transient ischemic attack Mother   . Multiple sclerosis Brother   . Lupus Maternal Aunt   . Breast cancer Maternal Aunt     dx 84s; deceased  . Cancer Maternal Grandfather     GI cancer or pancreatic; deceased 51  . Ovarian cancer Maternal Aunt     dx 13s; deceased 39s  . Colon cancer Neg Hx       Marti Sleigh, MD 03/06/2016, 11:25 AM         Consult Note: Gyn-Onc   Veronica Ward 70 y.o. female  Chief Complaint  Patient presents with  . Endometrial Cancer    Follow up    Assessment : Endometrial carcinoma (papillary serous) stage I a (March 2015). Status post 6 cycles of carboplatin Taxol chemotherapy and vaginal vault brachytherapy. Patient's clinically free of disease.  Plan:  She will see Dr. Marko Plume and Veronica Ward as scheduled. I plan on seeing the patient back in August 2016. Pap smears are obtained today  Interval history: The patient returns today for continuing  follow-up of endometrial carcinoma. Since her last visit with gynecologic oncology she's been seen by Dr. Marko Plume and Veronica Ward. Today we she reports she's having no problems and her health is been good except for a GI virus 2 weeks ago which cleared spontaneously. She specifically denies any GI or GU symptoms no pelvic pain pressure vaginal bleeding or discharge.  HPI: 70 year old white female seen in consultation request of Dr. Mora Ward regarding management of a newly diagnosed endometrial cancer. Approximately 3 weeks ago the patient developed some vaginal spotting. She was seen by Dr. Elly Ward on February 25. An endometrial biopsy was obtained showing a grade 2 endometrial carcinoma. The patient is also had an ultrasound of the pelvis revealing a uterus measuring 7 x 3 x 3.7 cm. The endometrial stripe was 7 mm.  [name] She has no other gynecologic history.   Patient underwent robotic hysterectomy bilateral salpingo-oophorectomy and pelvic lymphadenectomy at [facility] on 10/03/2013. 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 Adjuvant therapy with carboplatin and Taxol and vaginal vault brachytherapy was advised. The patient had a uncomplicated postoperative course.    Review of Systems:10 point review of systems is negative except as noted in interval history.   Vitals: Blood pressure 122/68, pulse 78, temperature 98.1 F (36.7 C), temperature source Oral, resp. rate 18, height 5' 2.75" (1.594 m), weight 104 lb 12.8 oz (47.5 kg), last menstrual period 09/17/2000, SpO2 100 %.  Physical Exam: General : The patient is a healthy woman in no acute distress.  HEENT: normocephalic, extraoccular movements normal; neck is supple without thyromegally  Lynphnodes: Supraclavicular and inguinal nodes not  enlarged  Abdomen: Soft, non-tender, no ascites, no organomegally, no masses, no hernias  Pelvic:  EGBUS: Normal female  Vagina: Normal, no lesions, atrophic , the cuff closure is intact and is healing appropriately. Urethra and Bladder: Normal, non-tender  Cervix:  Surgically absent Uterus: Surgically absent Bi-manual examination: Non-tender; no adenxal masses or nodularity  Rectal: normal sphincter tone, no masses, no blood  Lower extremities: No edema or varicosities. Normal range of motion      Allergies  Allergen Reactions  . Bactrim [Sulfamethoxazole-Trimethoprim] Rash  . Benadryl [Diphenhydramine Hcl (Sleep)] Rash and Anxiety    Fever, body ache  . Oxycodone-Acetaminophen Anxiety  . Sulfa Antibiotics Rash    Past Medical History:  Diagnosis Date  . Anemia   . Anxiety   . Ashkenazi Jewish ancestry   . Diffuse cystic mastopathy   . Disorder of bone and cartilage, unspecified   . Dysautonomia    mild  . Endometrial cancer ([place])   . Headache(784.0)   . History of shingles   . Hx of basal cell carcinoma    face  . Osteoporosis   . Other malaise and fatigue   . Other psoriasis   . Other seborrheic keratosis   . Pain in limb   . Predominant disturbance of emotions   . Radiation 01/06/14, 12/23/13, 12/16/13, 12/09/13   HDR brachytherapy  . Sleep disturbance, unspecified   . Unspecified hemorrhoids without mention of complication   . Urticaria, unspecified     Past Surgical History:  Procedure Laterality Date  . AXILLARY LYMPH NODE DISSECTION     left  . birthmark removal  childhood  . BREAST BIOPSY Left 2001   benign  . BREAST LUMPECTOMY     left underarm  . cervical cancer  1973  . CERVICAL CONIZATION W/BX    . COLONOSCOPY  12/04   int. hemorrhoids  . DEXA  03/2002   oseopenia  . DEXA  07/2004   decreased BMD, osteopenia  . DEXA  02/2007   Osteopenia  . DEXA  9/10   Osteopenia slightly worse  . felon I&D  12/2010   Veronica Ward, I&D in OR (L index finger)   . FINGER SURGERY    . KNEE SURGERY     right  . ROBOTIC ASSISTED TOTAL HYSTERECTOMY WITH BILATERAL SALPINGO OOPHERECTOMY  10/03/2013   Robotic-assisted total laparoscopic hysterectomy, bilateral salpingo-oophorectomy, bilateral pelvic and para-aortic lymphadenectomy with sentinel lymph node dissection  . TONSILECTOMY, ADENOIDECTOMY, BILATERAL MYRINGOTOMY AND TUBES    . TUBAL LIGATION      Current Outpatient Prescriptions  Medication Sig Dispense Refill  . alendronate (FOSAMAX) 70 MG tablet TAKE 1 TABLET BY MOUTH 30 MINUTES BEFOREBREAKFAST ONCE A WEEK WITH ATLEAST 8 OZ. OF WATER. 12 tablet 3  . Ascorbic Acid (VITAMIN C) 100 MG tablet Take 100 mg by mouth daily. Reported on 12/15/2015    . CALCIUM PO Take 1,000 mg by mouth daily.    . cholecalciferol (VITAMIN D) 400 UNITS TABS Take 400 Units by mouth daily.     Marland Kitchen co-enzyme Q-10 30 MG capsule Take 30 mg by mouth 3 (three) times daily.    . fish oil-omega-3 fatty acids 1000 MG capsule Take by mouth 2 (two) times daily.     . folic acid (FOLVITE) 1 MG tablet Take 1 mg by mouth daily.    Marland Kitchen ibuprofen (ADVIL,MOTRIN) 200 MG tablet Take 200 mg by mouth as needed for pain. Reported on 12/15/2015    . Lactobacillus (CVS PROBIOTIC ACIDOPHILUS) 10 MG CAPS Take 1 capsule by mouth daily.     . magnesium 30 MG tablet Take 30 mg by mouth daily. Reported on 12/15/2015    . Multiple Vitamin (MULTIVITAMIN) tablet Take 1 tablet by mouth daily.    Marland Kitchen  pyridOXINE (VITAMIN B-6) 100 MG tablet Take 100 mg by mouth daily.    . vitamin B-12 (CYANOCOBALAMIN) 100 MCG tablet Take 100 mcg by mouth daily.     No current facility-administered medications for this visit.     Social History   Social History  . Marital status: Married    Spouse name: N/A  . Number of children: 3  . Years of education: N/A   Occupational History  . Professor    Social History Main Topics  . Smoking status: Former Smoker    Quit date: 07/25/1963  . Smokeless tobacco: Never Used  .  Alcohol use 0.6 oz/week    1 Glasses of wine per week     Comment: 1/2 glass of wine a day  . Drug use: No  . Sexual activity: Yes    Birth control/ protection: Post-menopausal   Other Topics Concern  . Not on file   Social History Narrative   History professor      Likes to dance      Is a walker and does floor exercises      Caffeine: 2 cups daily    Family History  Problem Relation Age of Onset  . Multiple myeloma Father   . Coronary artery disease Father   . Transient ischemic attack Mother   . Multiple sclerosis Brother   . Lupus Maternal Aunt   . Breast cancer Maternal Aunt     dx 63s; deceased  . Cancer Maternal Grandfather     GI cancer or pancreatic; deceased 33  . Ovarian cancer Maternal Aunt     dx 16s; deceased 41s  . Colon cancer Neg Hx       Marti Sleigh, MD 03/06/2016, 11:25 AM

## 2016-03-08 LAB — CYTOLOGY - PAP

## 2016-03-10 ENCOUNTER — Telehealth: Payer: Self-pay

## 2016-03-10 NOTE — Telephone Encounter (Signed)
Orders received from [place] to contact the patient to update with PAP results being "normal" collected on 03/06/2016 during her visit with Dr Marti Sleigh. Attempted to contact the patient on home and cell phone to update . No answer, left a detailed message on both phones , call back information provided if additional questions arise.

## 2016-04-19 ENCOUNTER — Ambulatory Visit (INDEPENDENT_AMBULATORY_CARE_PROVIDER_SITE_OTHER): Payer: 59 | Admitting: Psychology

## 2016-04-19 DIAGNOSIS — F4323 Adjustment disorder with mixed anxiety and depressed mood: Secondary | ICD-10-CM

## 2016-04-28 ENCOUNTER — Telehealth: Payer: Self-pay | Admitting: Family Medicine

## 2016-04-28 NOTE — Telephone Encounter (Signed)
I prefer the regular flu shots (the high dose one tends to cause more side effect in my patients-that is what I have noticed)   If she has had a high dose shot before and did fine with it -that is ok -otherwise I recommend the regular flu shot.

## 2016-04-28 NOTE — Telephone Encounter (Signed)
Spoke with patient and informed of her Dr. Marliss Coots comments regarding flu vaccine.  She is okay with receiving the "regular" quadrivalent vaccine and does not request the high dose for her injection on Monday.

## 2016-04-28 NOTE — Telephone Encounter (Signed)
°  Patient Name: Veronica Ward  DOB: 09/10/1945    Initial Comment RECORD 2 of 2: Caller wants to know if he and wife need regular flu shots or super flu shots. Have appt Monday.   Nurse Assessment  Nurse: Julien Girt, RN, Almyra Free Date/Time Eilene Ghazi Time): 04/28/2016 12:39:28 PM  Confirm and document reason for call. If symptomatic, describe symptoms. You must click the next button to save text entered. ---Caller states that she and her husband are going in to to get flu shots on Monday(not in office), and they need to know whether to ask for super flu shots or regular.  Has the patient traveled out of the country within the last 30 days? ---Not Applicable  Does the patient have any new or worsening symptoms? ---No  Please document clinical information provided and list any resource used. ---Advised [name] that I would sent this note back to the nurse and request to have a call back. Informed that it is up to the PCP to determine the need per office. She verbalized understanding and will call back as needed.     Guidelines    Guideline Title Affirmed Question Affirmed Notes       Final Disposition User   Clinical Call Julien Girt, RN, Almyra Free

## 2016-05-01 ENCOUNTER — Ambulatory Visit (INDEPENDENT_AMBULATORY_CARE_PROVIDER_SITE_OTHER): Payer: Medicare Other

## 2016-05-01 DIAGNOSIS — Z23 Encounter for immunization: Secondary | ICD-10-CM

## 2016-05-02 ENCOUNTER — Ambulatory Visit: Payer: Self-pay

## 2016-05-03 ENCOUNTER — Ambulatory Visit (INDEPENDENT_AMBULATORY_CARE_PROVIDER_SITE_OTHER): Payer: 59 | Admitting: Psychology

## 2016-05-03 DIAGNOSIS — F4323 Adjustment disorder with mixed anxiety and depressed mood: Secondary | ICD-10-CM

## 2016-05-04 ENCOUNTER — Encounter: Payer: Self-pay | Admitting: Family Medicine

## 2016-05-04 ENCOUNTER — Ambulatory Visit (INDEPENDENT_AMBULATORY_CARE_PROVIDER_SITE_OTHER): Payer: Medicare Other | Admitting: Family Medicine

## 2016-05-04 VITALS — BP 100/62 | HR 73 | Wt 106.0 lb

## 2016-05-04 DIAGNOSIS — L03011 Cellulitis of right finger: Secondary | ICD-10-CM | POA: Diagnosis not present

## 2016-05-04 MED ORDER — AMOXICILLIN-POT CLAVULANATE 875-125 MG PO TABS: 1.0000 | ORAL_TABLET | Freq: Two times a day (BID) | ORAL | 0 refills | Status: DC

## 2016-05-04 NOTE — Patient Instructions (Signed)
Paronychia °Paronychia is an infection of the skin that surrounds a nail. It usually affects the skin around a fingernail, but it may also occur near a toenail. It often causes pain and swelling around the nail. This condition may come on suddenly or develop over a longer period. In some cases, a collection of pus (abscess) can form near or under the nail. Usually, paronychia is not serious and it clears up with treatment. °CAUSES °This condition may be caused by bacteria or fungi. It is commonly caused by either Streptococcus or Staphylococcus bacteria. The bacteria or fungi often cause the infection by getting into the affected area through an opening in the skin, such as a cut or a hangnail. °RISK FACTORS °This condition is more likely to develop in: °· People who get their hands wet often, such as those who work as dishwashers, bartenders, or nurses. °· People who bite their fingernails or suck their thumbs. °· People who trim their nails too short. °· People who have hangnails or injured fingertips. °· People who get manicures. °· People who have diabetes. °SYMPTOMS °Symptoms of this condition include: °· Redness and swelling of the skin near the nail. °· Tenderness around the nail when you touch the area. °· Pus-filled bumps under the cuticle. The cuticle is the skin at the base or sides of the nail. °· Fluid or pus under the nail. °· Throbbing pain in the area. °DIAGNOSIS °This condition is usually diagnosed with a physical exam. In some cases, a sample of pus may be taken from an abscess to be tested in a lab. This can help to determine what type of bacteria or fungi is causing the condition. °TREATMENT °Treatment for this condition depends on the cause and severity of the condition. If the condition is mild, it may clear up on its own in a few days. Your health care provider may recommend soaking the affected area in warm water a few times a day. When treatment is needed, the options may  include: °· Antibiotic medicine, if the condition is caused by a bacterial infection. °· Antifungal medicine, if the condition is caused by a fungal infection. °· Incision and drainage, if an abscess is present. In this procedure, the health care provider will cut open the abscess so the pus can drain out. °HOME CARE INSTRUCTIONS °· Soak the affected area in warm water if directed to do so by your health care provider. You may be told to do this for 20 minutes, 2-3 times a day. Keep the area dry in between soakings. °· Take medicines only as directed by your health care provider. °· If you were prescribed an antibiotic medicine, finish all of it even if you start to feel better. °· Keep the affected area clean. °· Do not try to drain a fluid-filled bump yourself. °· If you will be washing dishes or performing other tasks that require your hands to get wet, wear rubber gloves. You should also wear gloves if your hands might come in contact with irritating substances, such as cleaners or chemicals. °· Follow your health care provider's instructions about: °¨ Wound care. °¨ Bandage (dressing) changes and removal. °SEEK MEDICAL CARE IF: °· Your symptoms get worse or do not improve with treatment. °· You have a fever or chills. °· You have redness spreading from the affected area. °· You have continued or increased fluid, blood, or pus coming from the affected area. °· Your finger or knuckle becomes swollen or is difficult to move. °  °  This information is not intended to replace advice given to you by your health care provider. Make sure you discuss any questions you have with your health care provider. °  °Document Released: 01/03/2001 Document Revised: 11/24/2014 Document Reviewed: 06/17/2014 °Elsevier Interactive Patient Education ©2016 [employer] ° °

## 2016-05-04 NOTE — Progress Notes (Signed)
Subjective:    Patient ID: Veronica Ward, female    DOB: [birth year], 70 y.o.   MRN: [id number]  HPI This is a 70 yo female who presents today with right thumb nail swelling and pain that started yesterday. She cleaned with H2O2, soaked in warm water and took ibuprofen with some relief. She admits to biting cuticles and has had infections in the past. Became concerned with amount of swelling today. No fever, no streaking.   Past Medical History:  Diagnosis Date  . Anemia   . Anxiety   . Ashkenazi Jewish ancestry   . Diffuse cystic mastopathy   . Disorder of bone and cartilage, unspecified   . Dysautonomia    mild  . Endometrial cancer ([place])   . Headache(784.0)   . History of shingles   . Hx of basal cell carcinoma    face  . Osteoporosis   . Other malaise and fatigue   . Other psoriasis   . Other seborrheic keratosis   . Pain in limb   . Predominant disturbance of emotions   . Radiation 01/06/14, 12/23/13, 12/16/13, 12/09/13   HDR brachytherapy  . Sleep disturbance, unspecified   . Unspecified hemorrhoids without mention of complication   . Urticaria, unspecified    Past Surgical History:  Procedure Laterality Date  . AXILLARY LYMPH NODE DISSECTION     left  . birthmark removal  childhood  . BREAST BIOPSY Left 2001   benign  . BREAST LUMPECTOMY     left underarm  . cervical cancer  1973  . CERVICAL CONIZATION W/BX    . COLONOSCOPY  12/04   int. hemorrhoids  . DEXA  03/2002   oseopenia  . DEXA  07/2004   decreased BMD, osteopenia  . DEXA  02/2007   Osteopenia  . DEXA  9/10   Osteopenia slightly worse  . felon I&D  12/2010   Dr. Fredna Dow, I&D in OR (L index finger)  . FINGER SURGERY    . KNEE SURGERY     right  . ROBOTIC ASSISTED TOTAL HYSTERECTOMY WITH BILATERAL SALPINGO OOPHERECTOMY  10/03/2013   Robotic-assisted total laparoscopic hysterectomy, bilateral salpingo-oophorectomy, bilateral pelvic and para-aortic lymphadenectomy with sentinel lymph node dissection  .  TONSILECTOMY, ADENOIDECTOMY, BILATERAL MYRINGOTOMY AND TUBES    . TUBAL LIGATION     Family History  Problem Relation Age of Onset  . Multiple myeloma Father   . Coronary artery disease Father   . Transient ischemic attack Mother   . Multiple sclerosis Brother   . Lupus Maternal Aunt   . Breast cancer Maternal Aunt     dx 60s; deceased  . Cancer Maternal Grandfather     GI cancer or pancreatic; deceased 25  . Ovarian cancer Maternal Aunt     dx 33s; deceased 50s  . Colon cancer Neg Hx    Social History  Substance Use Topics  . Smoking status: Former Smoker    Quit date: 07/25/1963  . Smokeless tobacco: Never Used  . Alcohol use 0.6 oz/week    1 Glasses of wine per week     Comment: 1/2 glass of wine a day      Review of Systems Per HPI    Objective:   Physical Exam  Constitutional: She is oriented to person, place, and time. She appears well-developed and well-nourished. No distress.  thin  Eyes: Conjunctivae are normal.  Cardiovascular: Normal rate.   Pulmonary/Chest: Effort normal.  Musculoskeletal:  Area surrounding right nail with  moderate swelling, erythema and tenderness. Medial aspect with small amount visible purulence under skin. Very firm. Slightly warm. ROM intact. Normal nail bed.   Neurological: She is alert and oriented to person, place, and time.  Skin: Skin is warm and dry. She is not diaphoretic.  Psychiatric: She has a normal mood and affect. Her behavior is normal. Judgment and thought content normal.  Vitals reviewed.     BP 100/62   Pulse 73   Wt 106 lb (48.1 kg)   LMP 09/17/2000   SpO2 98%   BMI 18.93 kg/m  Wt Readings from Last 3 Encounters:  05/04/16 106 lb (48.1 kg)  03/06/16 104 lb 12.8 oz (47.5 kg)  01/26/16 104 lb 12.8 oz (47.5 kg)       Assessment & Plan:  1. Paronychia of right thumb - Area very firm and not amenable to draining today - Provided written and verbal information regarding diagnosis and treatment. - RTC  precautions reviewed - continue warm soaks 3-4 x/ day, ibuprofen prn, keep clean and dry - amoxicillin-clavulanate (AUGMENTIN) 875-125 MG tablet; Take 1 tablet by mouth 2 (two) times daily.  Dispense: 14 tablet; Refill: 0   Clarene Reamer, FNP-BC  [facility]  05/04/2016 9:59 AM

## 2016-05-05 ENCOUNTER — Telehealth: Payer: Self-pay

## 2016-05-05 ENCOUNTER — Ambulatory Visit: Payer: Self-pay | Admitting: Family Medicine

## 2016-05-05 NOTE — Telephone Encounter (Signed)
PLEASE NOTE: All timestamps contained within this report are represented as [place] Standard Time. CONFIDENTIALTY NOTICE: This fax transmission is intended only for the addressee. It contains information that is legally privileged, confidential or otherwise protected from use or disclosure. If you are not the intended recipient, you are strictly prohibited from reviewing, disclosing, copying using or disseminating any of this information or taking any action in reliance on or regarding this information. If you have received this fax in error, please notify us immediately by telephone so that we can arrange for its return to [place]. Phone: [phone], Toll-Free: [phone], Fax: [phone] Page: 1 of 2 Call Id: [id number] [place] Patient Name: Veronica Ward Gender: Female DOB: 03-16-1946 Age: 70 Y 3 M 1 D Return Phone Number: [phone] (Primary), [phone] (Secondary) Address: [place] [address] Client [facility] Night - Client Client Site [place] Physician AA - PHYSICIAN, NOT LISTED- MD Contact Type Call Who Is Calling Patient / Member / Family / Caregiver Call Type Triage / Clinical Relationship To Patient Self Return Phone Number [phone] (Primary) Chief Complaint Finger Pain Reason for Call Symptomatic / Request for [place] has an antibiotic for her finger, is unsure of the directions and her finger is still red and swollen, but has been soaking it PreDisposition Home Care Translation No Nurse Assessment Nurse: Donney Rankins, RN, Melissa Date/Time (Eastern Time): 05/04/2016 7:23:07 PM Confirm and document reason for call. If symptomatic, describe symptoms. You must click the next button to save text entered. ---caller states that she had a question about the frequency  of antibiotic but now has that figured out. Redness on her finger was not lanced at office today, it seems to be redder, no drainage more swollen. bites her skin around finger no trauma Has the patient traveled out of the country within the last 30 days? ---No Does the patient have any new or worsening symptoms? ---Yes Will a triage be completed? ---Yes Related visit to physician within the last 2 weeks? ---Yes Does the PT have any chronic conditions? (i.e. diabetes, asthma, etc.) ---Yes List chronic conditions. ---osteoporosis Is this a behavioral health or substance abuse call? ---No Guidelines Guideline Title Affirmed Question Affirmed Notes Nurse Date/Time (Eastern Time) Wound Infection [1] Wound > 48 hours old AND [2] it becomes more tender Donney Rankins, RN, Melissa 05/04/2016 7:27:08 PM Disp. Time Eilene Ghazi Time) Disposition Final User 05/04/2016 7:10:39 PM Attempt made - message left [place] 05/04/2016 7:37:46 PM See Physician within 24 Hours Yes Donney Rankins, RN, Melissa PLEASE NOTE: All timestamps contained within this report are represented as [place] Standard Time. CONFIDENTIALTY NOTICE: This fax transmission is intended only for the addressee. It contains information that is legally privileged, confidential or otherwise protected from use or disclosure. If you are not the intended recipient, you are strictly prohibited from reviewing, disclosing, copying using or disseminating any of this information or taking any action in reliance on or regarding this information. If you have received this fax in error, please notify us immediately by telephone so that we can arrange for its return to [place]. Phone: [phone], Toll-Free: [phone], Fax: [phone] Page: 2 of 2 Call Id: [id number] Caller Understands: Yes Disagree/Comply: Comply Care Advice Given Per Guideline SEE PHYSICIAN WITHIN 24 HOURS: * IF OFFICE WILL BE OPEN: You need to be seen within the next 24 hours.  Call your doctor when  the office opens, and make an appointment. WARM SOAKS OR LOCAL HEAT: * If the wound is CLOSED, apply a heating pad or warm, wet washcloth to the reddened area for 20 minutes three times a day. Do not soak the wound in water. ANTIBIOTIC OINTMENT: Apply an OTC antibiotic ointment (e.g., Bacitracin) 3 times a day. If the area could become dirty, cover with a Band-Aid or a gauze dressing. PAIN MEDICINES: * For pain relief, take acetaminophen, ibuprofen, or naproxen. * Use the lowest amount that makes your pain feel better. CAUTION - NSAIDS (E.G., IBUPROFEN, NAPROXEN): * Do not take nonsteroidal anti-inflammatory drugs (NSAIDs) if you have stomach problems, kidney disease, heart failure, or other contraindications to using this type of medication. CALL BACK IF: * Fever occurs * You become worse. CARE ADVICE per Wound Infection (Adult) guideline. Referrals REFERRED TO PCP OFFICE REFERRED TO PCP OFFICE

## 2016-05-05 NOTE — Telephone Encounter (Signed)
I spoke with pt and she said she took medication and this morning thinks problem has started to resolve; draining pus and is less swollen; feels better. Pt does not think she needs to be seen and pt will cb if needed. FYI to Glenda Chroman FNP.

## 2016-05-09 ENCOUNTER — Encounter: Payer: Self-pay | Admitting: Family Medicine

## 2016-05-09 ENCOUNTER — Ambulatory Visit (INDEPENDENT_AMBULATORY_CARE_PROVIDER_SITE_OTHER): Payer: Medicare Other | Admitting: Family Medicine

## 2016-05-09 VITALS — BP 122/68 | HR 69 | Temp 98.8°F | Ht 62.75 in | Wt 108.0 lb

## 2016-05-09 DIAGNOSIS — R7983 Abnormal findings of blood amino-acid level: Secondary | ICD-10-CM | POA: Insufficient documentation

## 2016-05-09 DIAGNOSIS — E7211 Homocystinuria: Secondary | ICD-10-CM | POA: Diagnosis not present

## 2016-05-09 DIAGNOSIS — E7219 Other disorders of sulfur-bearing amino-acid metabolism: Principal | ICD-10-CM

## 2016-05-09 MED ORDER — L-METHYLFOLATE 7.5 MG PO TABS
7.5000 mg | ORAL_TABLET | Freq: Every day | ORAL | 11 refills | Status: DC
Start: 2016-05-09 — End: 2017-11-02

## 2016-05-09 MED ORDER — L-METHYLFOLATE 7.5 MG PO TABS: 7.5000 mg | ORAL_TABLET | Freq: Every day | ORAL | 11 refills | Status: DC

## 2016-05-09 NOTE — Patient Instructions (Signed)
Let's check your homocysteine level-if it is normal -you probably do not need the methylated folate supplement  If it is high - you probably do  Eat a healthy balanced diet  Exercise!  Keep social and keep your brain active

## 2016-05-09 NOTE — Progress Notes (Signed)
Subjective:    Patient ID: Veronica Ward, female    DOB: 10-18-1945, 70 y.o.   MRN: [id number]  HPI Here to discuss testing for MTFHR (methalynetetrahydrafolate reductase) abnormality  She has been taking folic acid -px and then over the counter  Her mother's doctor told her to take it   Her sister had the test and was positive   ? If her homocysteine level is high  ? If the folate is helping   Her sister told her that the methylated version of folate would be more effective  L methyl folate 800 mg or mcg  Px version of this is very $ and too high a dose?     Was her for a finger infection  On augmentin- is really bloated with that  Taking probiotics   Patient Active Problem List   Diagnosis Date Noted  . Homocysteinemia ([place]) 05/09/2016  . Fatigue 01/26/2016  . Estrogen deficiency 07/21/2015  . Acute bronchitis 06/25/2015  . Left shoulder pain 09/29/2014  . Ashkenazi Jewish ancestry   . Endometrial cancer ([place]) 11/03/2013  . Uterine cancer ([place]) 10/28/2013  . CIS (carcinoma in situ of cervix) 09/23/2013  . Encounter for Medicare annual wellness exam 03/12/2013  . Colon cancer screening 03/12/2013  . Routine general medical examination at a health care facility 03/03/2013  . Encounter for medication monitoring 11/23/2010  . SEBORRHEIC KERATOSIS 08/25/2009  . STRESS REACTION, ACUTE, WITH EMOTIONAL DISTURBANCE 12/09/2008  . SLEEP DISORDER 12/09/2008  . HEMORRHOIDS 10/12/2008  . Osteoporosis 10/08/2007  . BASAL CELL CARCINOMA, FACE 06/21/2007  . FIBROCYSTIC BREAST DISEASE 06/21/2007  . PSORIASIS 06/21/2007  . URTICARIA 06/21/2007  . BASAL CELL CARCINOMA, FACE 06/21/2007   Past Medical History:  Diagnosis Date  . Anemia   . Anxiety   . Ashkenazi Jewish ancestry   . Diffuse cystic mastopathy   . Disorder of bone and cartilage, unspecified   . Dysautonomia    mild  . Endometrial cancer ([place])   . Headache(784.0)   . History of shingles   . Hx of basal cell  carcinoma    face  . Osteoporosis   . Other malaise and fatigue   . Other psoriasis   . Other seborrheic keratosis   . Pain in limb   . Predominant disturbance of emotions   . Radiation 01/06/14, 12/23/13, 12/16/13, 12/09/13   HDR brachytherapy  . Sleep disturbance, unspecified   . Unspecified hemorrhoids without mention of complication   . Urticaria, unspecified    Past Surgical History:  Procedure Laterality Date  . AXILLARY LYMPH NODE DISSECTION     left  . birthmark removal  childhood  . BREAST BIOPSY Left 2001   benign  . BREAST LUMPECTOMY     left underarm  . cervical cancer  1973  . CERVICAL CONIZATION W/BX    . COLONOSCOPY  12/04   int. hemorrhoids  . DEXA  03/2002   oseopenia  . DEXA  07/2004   decreased BMD, osteopenia  . DEXA  02/2007   Osteopenia  . DEXA  9/10   Osteopenia slightly worse  . felon I&D  12/2010   Dr. Fredna Dow, I&D in OR (L index finger)  . FINGER SURGERY    . KNEE SURGERY     right  . ROBOTIC ASSISTED TOTAL HYSTERECTOMY WITH BILATERAL SALPINGO OOPHERECTOMY  10/03/2013   Robotic-assisted total laparoscopic hysterectomy, bilateral salpingo-oophorectomy, bilateral pelvic and para-aortic lymphadenectomy with sentinel lymph node dissection  . TONSILECTOMY, ADENOIDECTOMY, BILATERAL MYRINGOTOMY AND  TUBES    . TUBAL LIGATION     Social History  Substance Use Topics  . Smoking status: Former Smoker    Quit date: 07/25/1963  . Smokeless tobacco: Never Used  . Alcohol use 0.6 oz/week    1 Glasses of wine per week     Comment: 1/2 glass of wine a day   Family History  Problem Relation Age of Onset  . Multiple myeloma Father   . Coronary artery disease Father   . Transient ischemic attack Mother   . Multiple sclerosis Brother   . Lupus Maternal Aunt   . Breast cancer Maternal Aunt     dx 36s; deceased  . Cancer Maternal Grandfather     GI cancer or pancreatic; deceased 103  . Ovarian cancer Maternal Aunt     dx 24s; deceased 10s  . Colon cancer Neg Hx     Allergies  Allergen Reactions  . Bactrim [Sulfamethoxazole-Trimethoprim] Rash  . Benadryl [Diphenhydramine Hcl (Sleep)] Rash and Anxiety    Fever, body ache  . Oxycodone-Acetaminophen Anxiety  . Sulfa Antibiotics Rash   Current Outpatient Prescriptions on File Prior to Visit  Medication Sig Dispense Refill  . alendronate (FOSAMAX) 70 MG tablet TAKE 1 TABLET BY MOUTH 30 MINUTES BEFOREBREAKFAST ONCE A WEEK WITH ATLEAST 8 OZ. OF WATER. 12 tablet 3  . amoxicillin-clavulanate (AUGMENTIN) 875-125 MG tablet Take 1 tablet by mouth 2 (two) times daily. 14 tablet 0  . Ascorbic Acid (VITAMIN C) 100 MG tablet Take 100 mg by mouth daily. Reported on 12/15/2015    . CALCIUM PO Take 1,000 mg by mouth daily.    . cholecalciferol (VITAMIN D) 400 UNITS TABS Take 400 Units by mouth daily.     Marland Kitchen co-enzyme Q-10 30 MG capsule Take 30 mg by mouth 3 (three) times daily.    . fish oil-omega-3 fatty acids 1000 MG capsule Take by mouth 2 (two) times daily.     . folic acid (FOLVITE) 1 MG tablet Take 1 mg by mouth daily.    Marland Kitchen ibuprofen (ADVIL,MOTRIN) 200 MG tablet Take 200 mg by mouth as needed for pain. Reported on 12/15/2015    . Lactobacillus (CVS PROBIOTIC ACIDOPHILUS) 10 MG CAPS Take 1 capsule by mouth daily.     . magnesium 30 MG tablet Take 30 mg by mouth daily. Reported on 12/15/2015    . Multiple Vitamin (MULTIVITAMIN) tablet Take 1 tablet by mouth daily.    Marland Kitchen pyridOXINE (VITAMIN B-6) 100 MG tablet Take 100 mg by mouth daily.    . vitamin B-12 (CYANOCOBALAMIN) 100 MCG tablet Take 100 mcg by mouth daily.     No current facility-administered medications on file prior to visit.      Review of Systems Review of Systems  Constitutional: Negative for fever, appetite change, fatigue and unexpected weight change.  Eyes: Negative for pain and visual disturbance.  Respiratory: Negative for cough and shortness of breath.   Cardiovascular: Negative for cp or palpitations    Gastrointestinal: Negative for nausea,  diarrhea and constipation.  Genitourinary: Negative for urgency and frequency.  Skin: Negative for pallor or rash   Neurological: Negative for weakness, light-headedness, numbness and headaches.  Hematological: Negative for adenopathy. Does not bruise/bleed easily.  Psychiatric/Behavioral: Negative for dysphoric mood. The patient is not nervous/anxious.         Objective:   Physical Exam  Constitutional: She appears well-developed and well-nourished. No distress.  Slim and well appearing  HENT:  Head: Normocephalic and  atraumatic.  Eyes: Conjunctivae and EOM are normal. Pupils are equal, round, and reactive to light.  Neck: Normal range of motion. Neck supple. No thyromegaly present.  Cardiovascular: Normal rate, regular rhythm and normal heart sounds.   Pulmonary/Chest: Effort normal and breath sounds normal.  Lymphadenopathy:    She has no cervical adenopathy.  Skin: Skin is warm and dry. No rash noted. No erythema. No pallor.  Psychiatric: She has a normal mood and affect.          Assessment & Plan:   Problem List Items Addressed This Visit      Other   Homocysteinemia ([place])    Pt is concerned about possible MTFHR mutation -runs in her family  Mother has athesclerosis / TIAs and OP  Disc pros/cons of testing-would need to change to a methylated folate if so  Will compromise on checking homocysteine level today - if it is high will change to the px folate and see if it helps  [place] health habits re: diet/exercise/ bp and cholesterol control and socialization/ brain activity  Also on alendronate for OP      Relevant Orders   Homocysteine    Other Visit Diagnoses   None.

## 2016-05-09 NOTE — Progress Notes (Signed)
Pre visit review using our clinic review tool, if applicable. No additional management support is needed unless otherwise documented below in the visit note. 

## 2016-05-10 LAB — HOMOCYSTEINE: Homocysteine: 5.6 umol/L (ref <10.4)

## 2016-05-10 NOTE — Assessment & Plan Note (Signed)
Pt is concerned about possible MTFHR mutation -runs in her family  Mother has athesclerosis / TIAs and OP  Disc pros/cons of testing-would need to change to a methylated folate if so  Will compromise on checking homocysteine level today - if it is high will change to the px folate and see if it helps  [place] health habits re: diet/exercise/ bp and cholesterol control and socialization/ brain activity  Also on alendronate for OP

## 2016-05-17 ENCOUNTER — Ambulatory Visit (INDEPENDENT_AMBULATORY_CARE_PROVIDER_SITE_OTHER): Payer: 59 | Admitting: Psychology

## 2016-05-17 DIAGNOSIS — F4323 Adjustment disorder with mixed anxiety and depressed mood: Secondary | ICD-10-CM | POA: Diagnosis not present

## 2016-06-01 ENCOUNTER — Ambulatory Visit
Admission: RE | Admit: 2016-06-01 | Discharge: 2016-06-01 | Disposition: A | Discharge status: Home / Self Care | Payer: Medicare Other | Source: Ambulatory Visit | Attending: Radiation Oncology | Admitting: Radiation Oncology

## 2016-06-01 ENCOUNTER — Encounter: Payer: Self-pay | Admitting: Radiation Oncology

## 2016-06-01 DIAGNOSIS — Z08 Encounter for follow-up examination after completed treatment for malignant neoplasm: Secondary | ICD-10-CM | POA: Diagnosis present

## 2016-06-01 DIAGNOSIS — Z8542 Personal history of malignant neoplasm of other parts of uterus: Secondary | ICD-10-CM | POA: Insufficient documentation

## 2016-06-01 DIAGNOSIS — C541 Malignant neoplasm of endometrium: Secondary | ICD-10-CM

## 2016-06-01 NOTE — Progress Notes (Signed)
Radiation Oncology         [phone] ________________________________  Name: Veronica Ward MRN: [id number]  Date: 06/01/2016  DOB: Jul 13, 1946  Follow-Up Visit Note  CC: Veronica Pardon, MD  Veronica Ward,*   Diagnosis:  Endometrial cancer, serous carcinoma , stage IA  Interval Since Last Radiation: 2 years 5 months  May 19, May 26th, June 2, June 9, January 06, 2014: Proximal vagina, 30 gray in 5 fractions, 4.0 cm treatment length, vaginal brachytherapy  Narrative:  The patient returns today for routine follow-up.  She denies pain, urinary, or bowel issues. She also denies having vaginal/rectal bleeding or discharge. She reports having a good energy level and appetite. She is sexually active and reports not using lubricant now. She will see Dr. Fermin Ward in February. Patient reports negative markers from her genetic exam.  ALLERGIES:  is allergic to bactrim [sulfamethoxazole-trimethoprim]; benadryl [diphenhydramine hcl (sleep)]; oxycodone-acetaminophen; and sulfa antibiotics.  Meds: Current Outpatient Prescriptions  Medication Sig Dispense Refill  . alendronate (FOSAMAX) 70 MG tablet TAKE 1 TABLET BY MOUTH 30 MINUTES BEFOREBREAKFAST ONCE A WEEK WITH ATLEAST 8 OZ. OF WATER. 12 tablet 3  . CALCIUM PO Take 1,000 mg by mouth daily.    . cholecalciferol (VITAMIN D) 400 UNITS TABS Take 400 Units by mouth daily.     Marland Kitchen co-enzyme Q-10 30 MG capsule Take 30 mg by mouth 3 (three) times daily.    . fish oil-omega-3 fatty acids 1000 MG capsule Take by mouth 2 (two) times daily.     Marland Kitchen ibuprofen (ADVIL,MOTRIN) 200 MG tablet Take 200 mg by mouth as needed for pain. Reported on 12/15/2015    . L-Methylfolate 7.5 MG TABS Take 1 tablet (7.5 mg total) by mouth daily. 30 tablet 11  . Lactobacillus (CVS PROBIOTIC ACIDOPHILUS) 10 MG CAPS Take 1 capsule by mouth daily.     . magnesium 30 MG tablet Take 30 mg by mouth daily. Reported on 12/15/2015    . Multiple Vitamin (MULTIVITAMIN) tablet Take  1 tablet by mouth daily.    Marland Kitchen pyridOXINE (VITAMIN B-6) 100 MG tablet Take 100 mg by mouth daily.     No current facility-administered medications for this encounter.     Physical Findings: The patient is in no acute distress. Patient is alert and oriented.  height is 5' 2.75" (1.594 m) and weight is 109 lb 6.4 oz (49.6 kg). Her oral temperature is 98 F (36.7 C). Her blood pressure is 123/68 and her pulse is 68. Her oxygen saturation is 100%.   The lungs are clear to auscultation. The heart has regular rhythm and rate. The abdomen is soft nontender with normal bowel sounds. No inguinal adenopathy appreciated. On pelvic examination the external genitalia are unremarkable. A speculum exam is performed. There are no mucosal lesions noted in the vaginal vault. On bimanual and rectovaginal examination no pelvic masses were appreciated.   Lab Findings: Lab Results  Component Value Date   WBC 4.3 01/26/2016   HGB 12.2 01/26/2016   HCT 35.9 (L) 01/26/2016   MCV 96.4 01/26/2016   PLT 156.0 01/26/2016    Radiographic Findings: No results found.  Impression:  No evidence recurrence on clinical exam today.   Plan: She will see Dr. Fermin Ward in September 10, 2016, and she should follow up with radiation oncology in May 2018. After this appointment, she can move her follow-up appointments out 6 months because she will be 3 years out from treatment. ____________________________________  Veronica Promise, PhD, MD  This document serves as a record of services personally performed by Gery Pray, MD. It was created on his behalf by Bethann Humble, a trained medical scribe. The creation of this record is based on the scribe's personal observations and the provider's statements to them. This document has been checked and approved by the attending provider.

## 2016-06-01 NOTE — Progress Notes (Signed)
Veronica Ward is here for follow up after treatment to her pelvis.  She denies having pain, urination or bowel issues.  She also denies having vaginal/rectal bleeding or discharge.  She reports having a good energy level and appetite.  She is sexually active and mentioned that she does not have to use lubricant now.  She will see Dr. Fermin Schwab in February.  BP 123/68 (BP Location: Left Arm, Patient Position: Sitting)   Pulse 68   Temp 98 F (36.7 C) (Oral)   Ht 5' 2.75" (1.594 m)   Wt 109 lb 6.4 oz (49.6 kg)   LMP 09/17/2000   SpO2 100%   BMI 19.53 kg/m    Wt Readings from Last 3 Encounters:  06/01/16 109 lb 6.4 oz (49.6 kg)  05/09/16 108 lb (49 kg)  05/04/16 106 lb (48.1 kg)

## 2016-06-07 ENCOUNTER — Ambulatory Visit (INDEPENDENT_AMBULATORY_CARE_PROVIDER_SITE_OTHER): Payer: 59 | Admitting: Psychology

## 2016-06-07 DIAGNOSIS — F4323 Adjustment disorder with mixed anxiety and depressed mood: Secondary | ICD-10-CM

## 2016-06-13 ENCOUNTER — Other Ambulatory Visit: Payer: Self-pay | Admitting: Family Medicine

## 2016-06-13 DIAGNOSIS — Z1231 Encounter for screening mammogram for malignant neoplasm of breast: Secondary | ICD-10-CM

## 2016-06-23 ENCOUNTER — Ambulatory Visit: Payer: 59 | Admitting: Psychology

## 2016-07-06 ENCOUNTER — Telehealth: Payer: Self-pay | Admitting: Family Medicine

## 2016-07-06 DIAGNOSIS — S92902A Unspecified fracture of left foot, initial encounter for closed fracture: Secondary | ICD-10-CM

## 2016-07-06 NOTE — Telephone Encounter (Signed)
Pt has appt with Dr Glori Bickers 07/07/16.

## 2016-07-06 NOTE — Telephone Encounter (Signed)
Patient Name: Veronica Ward  DOB: 12/17/1945    Initial Comment fell last wed. and landed on left foot, hurts to stand on it, aches, black and blue mark still on some toes   Nurse Assessment  Nurse: Mallie Mussel, RN, Alveta Heimlich Date/Time Eilene Ghazi Time): 07/06/2016 1:41:28 PM  Confirm and document reason for call. If symptomatic, describe symptoms. ---Caller states that she fell and landed on her left foot a week ago Monday. She has a little bit of bruising still on her left 4th toe. The toe still hurts to stand on it. She rates her pain as 3 on 0-10 scale. It feels swollen, but it not swollen. She denies numbness in her foot and toes. She has pain in her toes when she bends them.  Does the patient have any new or worsening symptoms? ---Yes  Will a triage be completed? ---Yes  Related visit to physician within the last 2 weeks? ---No  Does the PT have any chronic conditions? (i.e. diabetes, asthma, etc.) ---Yes  List chronic conditions. ---Psoriasis and Arthritis  Is this a behavioral health or substance abuse call? ---No     Guidelines    Guideline Title Affirmed Question Affirmed Notes  Foot and Ankle Injury [1] Limp when walking AND [2] due to a direct blow or crushing injury    Final Disposition User   See Physician within [place], RN, Alveta Heimlich    Referrals  REFERRED TO PCP OFFICE  Appointment scheduled for tomorrow at 10:15am with Dr. [name] Disagree/Comply: Comply

## 2016-07-06 NOTE — Telephone Encounter (Signed)
I will see her then  

## 2016-07-07 ENCOUNTER — Encounter: Payer: Self-pay | Admitting: Family Medicine

## 2016-07-07 ENCOUNTER — Ambulatory Visit (INDEPENDENT_AMBULATORY_CARE_PROVIDER_SITE_OTHER): Payer: Medicare Other | Admitting: Family Medicine

## 2016-07-07 ENCOUNTER — Ambulatory Visit (INDEPENDENT_AMBULATORY_CARE_PROVIDER_SITE_OTHER)
Admission: RE | Admit: 2016-07-07 | Discharge: 2016-07-07 | Disposition: A | Discharge status: Home / Self Care | Payer: Medicare Other | Source: Ambulatory Visit | Attending: Family Medicine | Admitting: Family Medicine

## 2016-07-07 VITALS — BP 124/80 | HR 72 | Temp 98.8°F | Ht 62.75 in | Wt 105.5 lb

## 2016-07-07 DIAGNOSIS — M79672 Pain in left foot: Secondary | ICD-10-CM

## 2016-07-07 NOTE — Progress Notes (Signed)
Subjective:    Patient ID: Veronica Ward, female    DOB: [birth year], 70 y.o.   MRN: [id number]  HPI Here for foot injury  L foot  10 d ago   Was doing floor exercises - got up with 5 lb wts and her R foot got caught in her L pajama leg She tried to stop the fall with L foot -and it worked  But then a lot of pain in her foot  No impact just her whole weight on it /poss twisted  From the ball of foot to end of toes severely painful She was able to bear wt Veronica Ward   Was able to go dance once- uncomfortable esp afterwards   First she used ice  Then advil  Then epsom salts soak -that seemed to help  Every night since she had to take advil for aching in her distal foot and toes (bottom of the foot)  Felt tight but no visible swelling  Tus she did get bruising on the top of her foot and proximal toes  Wore roomy shoes  Now feels a pulling sensation/ uncomfortable  Walking is uncomfortable    She took it easy last week  Also tired out  Lost 2 lb - so she ate more   Patient Active Problem List   Diagnosis Date Noted  . Left foot pain 07/07/2016  . Homocysteinemia ([place]) 05/09/2016  . Fatigue 01/26/2016  . Estrogen deficiency 07/21/2015  . Acute bronchitis 06/25/2015  . Left shoulder pain 09/29/2014  . Ashkenazi Jewish ancestry   . Endometrial cancer ([place]) 11/03/2013  . Uterine cancer ([place]) 10/28/2013  . CIS (carcinoma in situ of cervix) 09/23/2013  . Encounter for Medicare annual wellness exam 03/12/2013  . Colon cancer screening 03/12/2013  . Routine general medical examination at a health care facility 03/03/2013  . Encounter for medication monitoring 11/23/2010  . SEBORRHEIC KERATOSIS 08/25/2009  . STRESS REACTION, ACUTE, WITH EMOTIONAL DISTURBANCE 12/09/2008  . SLEEP DISORDER 12/09/2008  . HEMORRHOIDS 10/12/2008  . Osteoporosis 10/08/2007  . BASAL CELL CARCINOMA, FACE 06/21/2007  . FIBROCYSTIC BREAST DISEASE 06/21/2007  . PSORIASIS 06/21/2007  . URTICARIA  06/21/2007  . BASAL CELL CARCINOMA, FACE 06/21/2007   Past Medical History:  Diagnosis Date  . Anemia   . Anxiety   . Ashkenazi Jewish ancestry   . Diffuse cystic mastopathy   . Disorder of bone and cartilage, unspecified   . Dysautonomia    mild  . Endometrial cancer ([place])   . Headache(784.0)   . History of shingles   . Hx of basal cell carcinoma    face  . Osteoporosis   . Other malaise and fatigue   . Other psoriasis   . Other seborrheic keratosis   . Pain in limb   . Predominant disturbance of emotions   . Radiation 01/06/14, 12/23/13, 12/16/13, 12/09/13   HDR brachytherapy  . Sleep disturbance, unspecified   . Unspecified hemorrhoids without mention of complication   . Urticaria, unspecified    Past Surgical History:  Procedure Laterality Date  . AXILLARY LYMPH NODE DISSECTION     left  . birthmark removal  childhood  . BREAST BIOPSY Left 2001   benign  . BREAST LUMPECTOMY     left underarm  . cervical cancer  1973  . CERVICAL CONIZATION W/BX    . COLONOSCOPY  12/04   int. hemorrhoids  . DEXA  03/2002   oseopenia  . DEXA  07/2004   decreased BMD,  osteopenia  . DEXA  02/2007   Osteopenia  . DEXA  9/10   Osteopenia slightly worse  . felon I&D  12/2010   Dr. Fredna Dow, I&D in OR (L index finger)  . FINGER SURGERY    . KNEE SURGERY     right  . ROBOTIC ASSISTED TOTAL HYSTERECTOMY WITH BILATERAL SALPINGO OOPHERECTOMY  10/03/2013   Robotic-assisted total laparoscopic hysterectomy, bilateral salpingo-oophorectomy, bilateral pelvic and para-aortic lymphadenectomy with sentinel lymph node dissection  . TONSILECTOMY, ADENOIDECTOMY, BILATERAL MYRINGOTOMY AND TUBES    . TUBAL LIGATION     Social History  Substance Use Topics  . Smoking status: Former Smoker    Quit date: 07/25/1963  . Smokeless tobacco: Never Used  . Alcohol use 0.6 oz/week    1 Glasses of wine per week     Comment: 1/2 glass of wine a day   Family History  Problem Relation Age of Onset  . Multiple  myeloma Father   . Coronary artery disease Father   . Transient ischemic attack Mother   . Multiple sclerosis Brother   . Lupus Maternal Aunt   . Breast cancer Maternal Aunt     dx 48s; deceased  . Cancer Maternal Grandfather     GI cancer or pancreatic; deceased 81  . Ovarian cancer Maternal Aunt     dx 68s; deceased 61s  . Colon cancer Neg Hx    Allergies  Allergen Reactions  . Bactrim [Sulfamethoxazole-Trimethoprim] Rash  . Benadryl [Diphenhydramine Hcl (Sleep)] Rash and Anxiety    Fever, body ache  . Oxycodone-Acetaminophen Anxiety  . Sulfa Antibiotics Rash   Current Outpatient Prescriptions on File Prior to Visit  Medication Sig Dispense Refill  . alendronate (FOSAMAX) 70 MG tablet TAKE 1 TABLET BY MOUTH 30 MINUTES BEFOREBREAKFAST ONCE A WEEK WITH ATLEAST 8 OZ. OF WATER. 12 tablet 3  . CALCIUM PO Take 1,000 mg by mouth daily.    . cholecalciferol (VITAMIN D) 400 UNITS TABS Take 400 Units by mouth daily.     Marland Kitchen co-enzyme Q-10 30 MG capsule Take 30 mg by mouth 3 (three) times daily.    . fish oil-omega-3 fatty acids 1000 MG capsule Take by mouth 2 (two) times daily.     Marland Kitchen ibuprofen (ADVIL,MOTRIN) 200 MG tablet Take 200 mg by mouth as needed for pain. Reported on 12/15/2015    . L-Methylfolate 7.5 MG TABS Take 1 tablet (7.5 mg total) by mouth daily. 30 tablet 11  . Lactobacillus (CVS PROBIOTIC ACIDOPHILUS) 10 MG CAPS Take 1 capsule by mouth daily.     . magnesium 30 MG tablet Take 30 mg by mouth daily. Reported on 12/15/2015    . Multiple Vitamin (MULTIVITAMIN) tablet Take 1 tablet by mouth daily.    Marland Kitchen pyridOXINE (VITAMIN B-6) 100 MG tablet Take 100 mg by mouth daily.     No current facility-administered medications on file prior to visit.     Review of Systems    Review of Systems  Constitutional: Negative for fever, appetite change, and unexpected weight change. pos for fatigue  Eyes: Negative for pain and visual disturbance.  Respiratory: Negative for cough and shortness  of breath.   Cardiovascular: Negative for cp or palpitations    Gastrointestinal: Negative for nausea, diarrhea and constipation.  Genitourinary: Negative for urgency and frequency.  Skin: Negative for pallor or rash   MSK pos for L foot pain and bruisint  Neurological: Negative for weakness, light-headedness, numbness and headaches.  Hematological: Negative for adenopathy. Does  not bruise/bleed easily.  Psychiatric/Behavioral: Negative for dysphoric mood. The patient is not nervous/anxious.      Objective:   Physical Exam  Constitutional: She appears well-developed and well-nourished. No distress.  Slim and well appearing   Musculoskeletal:       Left foot: There is decreased range of motion, tenderness, bony tenderness and swelling. There is no crepitus and no deformity.  L foot-tender over distal metatarsals and prox phalanges (dorasally)- 90-5th  Some old bruising and scant swelling in this area  Nl rom toes Some inc pain on plantar flexing the foot  Nl sens and perfusion   No neuro changes   Neurological: She displays no atrophy. No sensory deficit. She exhibits normal muscle tone.          Assessment & Plan:   Problem List Items Addressed This Visit      Other   Left foot pain    Pain and bruising over dorsal L foot after a near fall (? Twist)  Xray now  Post op shoe Elevation/ice/heat and rel rest  Pending plan       Relevant Orders   DG Foot Complete Left (Completed)

## 2016-07-07 NOTE — Progress Notes (Signed)
Pre visit review using our clinic review tool, if applicable. No additional management support is needed unless otherwise documented below in the visit note. 

## 2016-07-07 NOTE — Assessment & Plan Note (Signed)
Pain and bruising over dorsal L foot after a near fall (? Twist)  Xray now  Post op shoe Elevation/ice/heat and rel rest  Pending plan

## 2016-07-07 NOTE — Patient Instructions (Signed)
Xray of foot now  We will contact you with a result  Walk with the surgical shoe  Use ice/heat- whichever feels better  Anti inflammatory as needed- ibuprofen (take with food)   Plan to follow

## 2016-07-10 DIAGNOSIS — S92909A Unspecified fracture of unspecified foot, initial encounter for closed fracture: Secondary | ICD-10-CM | POA: Insufficient documentation

## 2016-07-10 NOTE — Telephone Encounter (Signed)
Done and will route to [facility] 

## 2016-07-10 NOTE — Telephone Encounter (Signed)
-----   Message from Tammi Sou, [place] sent at 07/07/2016  3:41 PM EST ----- Pt notified of xray results and Dr. Marliss Coots comments, pt prefers to see Ortho in [place], please put referral in and I advise pt our [facility] will call to schedule appt

## 2016-07-14 ENCOUNTER — Ambulatory Visit (INDEPENDENT_AMBULATORY_CARE_PROVIDER_SITE_OTHER): Payer: 59 | Admitting: Psychology

## 2016-07-14 DIAGNOSIS — F4323 Adjustment disorder with mixed anxiety and depressed mood: Secondary | ICD-10-CM

## 2016-07-24 ENCOUNTER — Other Ambulatory Visit: Payer: Self-pay | Admitting: Nurse Practitioner

## 2016-07-25 ENCOUNTER — Ambulatory Visit
Admission: RE | Admit: 2016-07-25 | Discharge: 2016-07-25 | Disposition: A | Discharge status: Home / Self Care | Payer: Medicare Other | Source: Ambulatory Visit | Attending: Family Medicine | Admitting: Family Medicine

## 2016-07-25 DIAGNOSIS — Z1231 Encounter for screening mammogram for malignant neoplasm of breast: Secondary | ICD-10-CM

## 2016-08-11 ENCOUNTER — Ambulatory Visit: Payer: Self-pay | Admitting: Psychology

## 2016-08-14 ENCOUNTER — Encounter: Payer: Self-pay | Admitting: Internal Medicine

## 2016-08-14 ENCOUNTER — Ambulatory Visit (INDEPENDENT_AMBULATORY_CARE_PROVIDER_SITE_OTHER): Payer: Medicare Other | Admitting: Internal Medicine

## 2016-08-14 ENCOUNTER — Telehealth: Payer: Self-pay

## 2016-08-14 ENCOUNTER — Other Ambulatory Visit: Payer: Self-pay | Admitting: Family Medicine

## 2016-08-14 VITALS — BP 128/80 | HR 100 | Temp 98.3°F | Wt 106.0 lb

## 2016-08-14 DIAGNOSIS — B9789 Other viral agents as the cause of diseases classified elsewhere: Secondary | ICD-10-CM

## 2016-08-14 DIAGNOSIS — J069 Acute upper respiratory infection, unspecified: Secondary | ICD-10-CM

## 2016-08-14 NOTE — Telephone Encounter (Signed)
Pt is aware as instructed 

## 2016-08-14 NOTE — Progress Notes (Signed)
HPI  Pt presents to the clinic today with c/o headache, runny nose, cough and chest congestion. This started 8 days ago. She is blowing clear mucous out of her nose. The cough is productive of blood tinged grey mucous.  She denies fever but has had chills and body aches. She has tried cough syrup and Ibuprofen OTC. She has no history of allergies or breathing problems. She has had sick contacts. She is UTD on her flu and pneumonia vaccines.  Review of Systems        Past Medical History:  Diagnosis Date  . Anemia   . Anxiety   . Ashkenazi Jewish ancestry   . Diffuse cystic mastopathy   . Disorder of bone and cartilage, unspecified   . Dysautonomia    mild  . Endometrial cancer ([place])   . Headache(784.0)   . History of shingles   . Hx of basal cell carcinoma    face  . Osteoporosis   . Other malaise and fatigue   . Other psoriasis   . Other seborrheic keratosis   . Pain in limb   . Predominant disturbance of emotions   . Radiation 01/06/14, 12/23/13, 12/16/13, 12/09/13   HDR brachytherapy  . Sleep disturbance, unspecified   . Unspecified hemorrhoids without mention of complication   . Urticaria, unspecified     Family History  Problem Relation Age of Onset  . Multiple myeloma Father   . Coronary artery disease Father   . Transient ischemic attack Mother   . Multiple sclerosis Brother   . Lupus Maternal Aunt   . Breast cancer Maternal Aunt     dx 47s; deceased  . Cancer Maternal Grandfather     GI cancer or pancreatic; deceased 87  . Ovarian cancer Maternal Aunt     dx 13s; deceased 32s  . Colon cancer Neg Hx     Social History   Social History  . Marital status: Married    Spouse name: N/A  . Number of children: 3  . Years of education: N/A   Occupational History  . Professor    Social History Main Topics  . Smoking status: Former Smoker    Quit date: 07/25/1963  . Smokeless tobacco: Never Used  . Alcohol use 0.6 oz/week    1 Glasses of wine per week   Comment: 1/2 glass of wine a day  . Drug use: No  . Sexual activity: Yes    Birth control/ protection: Post-menopausal   Other Topics Concern  . Not on file   Social History Narrative   History professor      Likes to dance      Is a walker and does floor exercises      Caffeine: 2 cups daily    Allergies  Allergen Reactions  . Bactrim [Sulfamethoxazole-Trimethoprim] Rash  . Benadryl [Diphenhydramine Hcl (Sleep)] Rash and Anxiety    Fever, body ache  . Oxycodone-Acetaminophen Anxiety  . Sulfa Antibiotics Rash     Constitutional: Positive headache. Denies fatigue, fever or abrupt weight changes.  HEENT:  Positive runny nose. Denies eye redness, eye pain, pressure behind the eyes, facial pain, nasal congestion, ear pain, ringing in the ears, wax buildup or sore throat. Respiratory: Positive cough. Denies difficulty breathing or shortness of breath.  Cardiovascular: Denies chest pain, chest tightness, palpitations or swelling in the hands or feet.   No other specific complaints in a complete review of systems (except as listed in HPI above).  Objective:  BP 128/80   Pulse 100   Temp 98.3 F (36.8 C) (Oral)   Wt 106 lb (48.1 kg)   LMP 09/17/2000   SpO2 98%   BMI 18.93 kg/m  Wt Readings from Last 3 Encounters:  08/14/16 106 lb (48.1 kg)  07/07/16 105 lb 8 oz (47.9 kg)  06/01/16 109 lb 6.4 oz (49.6 kg)     General: Appears her stated age, in NAD. HEENT: Head: normal shape and size, no sinus tenderness noted; Eyes: sclera white, no icterus, conjunctiva pink; Nose: mucosa pink and moist, septum midline; Throat/Mouth: + PND. Teeth present, mucosa pink and moist, no exudate noted, no lesions or ulcerations noted.  Neck: No cervical lymphadenopathy.  Cardiovascular: Normal rate and rhythm.  Pulmonary/Chest: Normal effort and positive vesicular breath sounds. No respiratory distress. No wheezes, rales or ronchi noted.       Assessment & Plan:   Viral Upper  Respiratory Infection with Cough:  Get some rest and drink plenty of water Start Mucinex 600 mg every 12 hours Delsym as needed for cough Can use humidifier with peppermint oil. Return precautions discussed  RTC as needed or if symptoms persist.   Webb Silversmith, NP

## 2016-08-14 NOTE — Telephone Encounter (Signed)
Pt left v/m; pt was seen earlier today and forgot to ask if pt is still contagious. Pt request cb.

## 2016-08-14 NOTE — Patient Instructions (Signed)
Upper Respiratory Infection, Adult Most upper respiratory infections (URIs) are caused by a virus. A URI affects the nose, throat, and upper air passages. The most common type of URI is often called "the common cold." Follow these instructions at home:  Take medicines only as told by your doctor.  Gargle warm saltwater or take cough drops to comfort your throat as told by your doctor.  Use a warm mist humidifier or inhale steam from a shower to increase air moisture. This may make it easier to breathe.  Drink enough fluid to keep your pee (urine) clear or pale yellow.  Eat soups and other clear broths.  Have a healthy diet.  Rest as needed.  Go back to work when your fever is gone or your doctor says it is okay.  You may need to stay home longer to avoid giving your URI to others.  You can also wear a face mask and wash your hands often to prevent spread of the virus.  Use your inhaler more if you have asthma.  Do not use any tobacco products, including cigarettes, chewing tobacco, or electronic cigarettes. If you need help quitting, ask your doctor. Contact a doctor if:  You are getting worse, not better.  Your symptoms are not helped by medicine.  You have chills.  You are getting more short of breath.  You have brown or red mucus.  You have yellow or brown discharge from your nose.  You have pain in your face, especially when you bend forward.  You have a fever.  You have puffy (swollen) neck glands.  You have pain while swallowing.  You have white areas in the back of your throat. Get help right away if:  You have very bad or constant:  Headache.  Ear pain.  Pain in your forehead, behind your eyes, and over your cheekbones (sinus pain).  Chest pain.  You have long-lasting (chronic) lung disease and any of the following:  Wheezing.  Long-lasting cough.  Coughing up blood.  A change in your usual mucus.  You have a stiff neck.  You have  changes in your:  Vision.  Hearing.  Thinking.  Mood. This information is not intended to replace advice given to you by your health care provider. Make sure you discuss any questions you have with your health care provider. Document Released: 12/27/2007 Document Revised: 03/12/2016 Document Reviewed: 10/15/2013 Elsevier Interactive Patient Education  2017 Elsevier Inc.  

## 2016-08-14 NOTE — Telephone Encounter (Signed)
Her issues are viral. She should be okay to be around people as long as she is not running a fever. She just needs to make sure to cover her mouth when she coughs and washes her hands frequently.

## 2016-08-18 ENCOUNTER — Telehealth: Payer: Self-pay

## 2016-08-18 NOTE — Telephone Encounter (Signed)
PLEASE NOTE: All timestamps contained within this report are represented as [place] Standard Time. CONFIDENTIALTY NOTICE: This fax transmission is intended only for the addressee. It contains information that is legally privileged, confidential or otherwise protected from use or disclosure. If you are not the intended recipient, you are strictly prohibited from reviewing, disclosing, copying using or disseminating any of this information or taking any action in reliance on or regarding this information. If you have received this fax in error, please notify us immediately by telephone so that we can arrange for its return to [place]. Phone: [phone], Toll-Free: [phone], Fax: [phone] Page: 1 of 2 Call Id: [id number] [place] Patient Name: Veronica Ward Gender: Female DOB: 1946/01/23 Age: 71 Y 61 M 15 D Return Phone Number: [phone] (Primary), [phone] (Secondary) Address: [address] City/State/Zip: [place] [address] Client [place] [facility] - Client Client Site [facility] - Day Physician Webb Silversmith - NP Who Is Calling Patient / Member / Family / Caregiver Call Type Triage / Clinical Relationship To Patient Self Return Phone Number [phone] (Primary) Chief Complaint Cough Reason for Call Symptomatic / Request for [place] states saw dr on Monday, has started shivering again, still coughing, throat sore Appointment Disposition EMR Appointment Attempted - Not Scheduled Info pasted into Epic Yes Nurse Assessment Nurse: Zorita Pang, RN, Neoma Laming Date/Time (Eastern Time): 08/18/2016 2:19:39 PM Confirm and document reason for call. If symptomatic, describe symptoms. ---Caller states that she is having chills and coughing and has a sore throat. She states that she is not running a fever. She states that  the doctor told her to take cough syrup and Mucinex. She states that she is having trouble keeping her eyes open. During this time she did have a temperature of 99 as the highest. She states that this has been going on for about 10 days. Does the PT have any chronic conditions? (i.e. diabetes, asthma, etc.) ---No Guidelines Guideline Title Affirmed Question Sore Throat [1] Sore throat with cough/cold symptoms AND [2] present > 5 days Disp. Time Eilene Ghazi Time) Disposition Final User 08/18/2016 2:35:47 PM See PCP When Office is Open (within 3 days) Yes Womble, RN, Deborah Referrals REFERRED TO PCP OFFICE Care Advice Given Per Guideline SEE PCP WITHIN 3 DAYS: * Sip warm chicken broth or apple juice. * Gargle with warm salt water four times a day. To make salt water, put 1/2 teaspoon of salt in 8 oz (240 ml) of warm water. * Drink plenty of liquids. This is important to prevent dehydation. * A healthy adult should drink 8 cups (240 ml) or more of liquid each day. CALL BACK IF: * You become worse. CARE ADVICE given per Sore Throat (Adult) guideline. PLEASE NOTE: All timestamps contained within this report are represented as [place] Standard Time. CONFIDENTIALTY NOTICE: This fax transmission is intended only for the addressee. It contains information that is legally privileged, confidential or otherwise protected from use or disclosure. If you are not the intended recipient, you are strictly prohibited from reviewing, disclosing, copying using or disseminating any of this information or taking any action in reliance on or regarding this information. If you have received this fax in error, please notify us immediately by telephone so that we can arrange for its return to [place]. Phone: [phone], Toll-Free: [phone], Fax: [phone] Page: 2 of 2 Call Id: [id number] Comments User: Marquis Buggy,  RN Date/Time Eilene Ghazi Time): 08/18/2016 2:42:12 PM Caller states that she has been sick for about  10 days. with sore throat, non productive cough and only had a fever one time. She states that she has only run a fever one day and that it was 99. Today her temperature is 97.8. She has been using a humidifier and using cough drops. She states that she is taking warm baths and that makes her feel better. She states that her eyes feel very irritated and she doesn't want to keep her eyes open. She did see her doctor about a week ago and he told her that her throat was a little red and told her to take cough syrup and Mucinex. She states that she is usually very active and is a little depressed that she hasn't gotten well. She also states that her sister has the same symptoms (but they live in other towns and she has not seen her recently.) The triage results were to see her doctor in 3 days. The caller verbalizes understanding. The home care instructions were reviewed and she was doing the majority of those things already. She was also instructed to call back if she starts running a fever or is she gets worse so that she could be reevaluated and she verbalizes understanding.

## 2016-08-20 NOTE — Telephone Encounter (Signed)
Please check on her and see how she is feeling 

## 2016-08-21 ENCOUNTER — Ambulatory Visit: Payer: Self-pay | Admitting: Internal Medicine

## 2016-08-21 NOTE — Telephone Encounter (Signed)
Pt said she is still very tired and fatigued, her cough is a little better but she has started coughing up yellow phlegm, and her voice is coming back her main sxs is worsening fatigued and new sxs is the congestion she is starting to cough up

## 2016-08-21 NOTE — Telephone Encounter (Signed)
Pt notified of Dr. Marliss Coots comments/instrctions and verbalized understanding

## 2016-08-21 NOTE — Telephone Encounter (Signed)
Continue mucinex to expectorate phlegm from the cough  Rest/ fluids  If fever or other new symptoms-f/u  If no further improvement this week- f/u Thanks

## 2016-08-22 ENCOUNTER — Ambulatory Visit: Payer: Self-pay | Admitting: Psychology

## 2016-09-11 ENCOUNTER — Encounter: Payer: Self-pay | Admitting: Gynecology

## 2016-09-11 ENCOUNTER — Other Ambulatory Visit (HOSPITAL_COMMUNITY)
Admission: RE | Admit: 2016-09-11 | Discharge: 2016-09-11 | Disposition: A | Discharge status: Home / Self Care | Payer: Medicare Other | Source: Ambulatory Visit | Attending: Gynecology | Admitting: Gynecology

## 2016-09-11 ENCOUNTER — Ambulatory Visit: Payer: Medicare Other | Attending: Gynecology | Admitting: Gynecology

## 2016-09-11 VITALS — BP 115/74 | HR 76 | Temp 99.0°F | Resp 18 | Ht 62.75 in | Wt 105.9 lb

## 2016-09-11 DIAGNOSIS — Z01411 Encounter for gynecological examination (general) (routine) with abnormal findings: Secondary | ICD-10-CM | POA: Insufficient documentation

## 2016-09-11 DIAGNOSIS — Z8542 Personal history of malignant neoplasm of other parts of uterus: Secondary | ICD-10-CM

## 2016-09-11 DIAGNOSIS — Z9221 Personal history of antineoplastic chemotherapy: Secondary | ICD-10-CM

## 2016-09-11 DIAGNOSIS — Z90710 Acquired absence of both cervix and uterus: Secondary | ICD-10-CM | POA: Diagnosis not present

## 2016-09-11 DIAGNOSIS — C541 Malignant neoplasm of endometrium: Secondary | ICD-10-CM

## 2016-09-11 NOTE — Progress Notes (Signed)
Consult Note: Gyn-Onc   Veronica Ward 71 y.o. female  Chief Complaint  Patient presents with  . Endometrial Cancer    Assessment : Endometrial carcinoma (papillary serous) stage I a (March 2015). Status post 6 cycles of carboplatin Taxol chemotherapy and vaginal vault brachytherapy. Patient is clinically free of disease.  Plan:  She will see Dr, Sondra Come in 3 months. I plan on seeing the patient back in 9 months. Pap smears are obtained today she will continue to have annual mammograms.  Interval history: The patient returns today for continuing follow-up of endometrial carcinoma. Since her last visit with gynecologic oncology she's been seen by Dr. Sondra Come. Today we she reports she's having no problems and her health is been goodExcept for a recent fractured toe.. She specifically denies any GI or GU symptoms no pelvic pain pressure vaginal bleeding or discharge. She has no neuropathy.   HPI: 71 year old white female initially seen in consultation request of Dr. Mora Bellman regarding management of a newly diagnosed endometrial cancer. Approximately 3 weeks ago the patient developed some vaginal spotting. She was seen by Dr. Elly Modena on February 25. An endometrial biopsy was obtained showing a grade 2 endometrial carcinoma. The patient is also had an ultrasound of the pelvis revealing a uterus measuring 7 x 3 x 3.7 cm. The endometrial stripe was 7 mm.  [name] She has no other gynecologic history.   Patient underwent robotic hysterectomy bilateral salpingo-oophorectomy and pelvic lymphadenectomy at [facility] on 10/03/2013. 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 Adjuvant therapy with carboplatin and Taxol and vaginal vault brachytherapy was given completed in August 2015.   Review of Systems:10  point review of systems is negative except as noted in interval history.   Vitals: Blood pressure 115/74, pulse 76, temperature 99 F (37.2 C), temperature source Oral, resp. rate 18, height 5' 2.75" (1.594 m), weight 105 lb 14.4 oz (48 kg), last menstrual period 09/17/2000, SpO2 100 %.  Physical Exam: General : The patient is a healthy woman in no acute distress.  HEENT: normocephalic, extraoccular movements normal; neck is supple without thyromegally  Lynphnodes: Supraclavicular and inguinal nodes not enlarged  Abdomen: Soft, non-tender, no ascites, no organomegally, no masses, no hernias  Pelvic:  EGBUS: Normal female  Vagina: Normal, no lesions, atrophic   Urethra and Bladder: Normal, non-tender  Cervix: Surgically absent Uterus: Surgically absent Bi-manual examination: Non-tender; no adenxal masses or nodularity  Rectal: normal sphincter tone, no masses, no blood  Lower extremities: No edema or varicosities. Normal range of motion      Allergies  Allergen Reactions  . Bactrim [Sulfamethoxazole-Trimethoprim] Rash  . Benadryl [Diphenhydramine Hcl (Sleep)] Rash and Anxiety    Fever, body ache  . Oxycodone-Acetaminophen Anxiety  . Sulfa Antibiotics Rash    Past Medical History:  Diagnosis Date  . Anemia   . Anxiety   . Ashkenazi Jewish ancestry   . Diffuse cystic mastopathy   . Disorder of bone and cartilage, unspecified   . Dysautonomia    mild  . Endometrial cancer ([place])   . Headache(784.0)   . History of shingles   . Hx of basal cell carcinoma    face  . Osteoporosis   . Other malaise and fatigue   . Other psoriasis   . Other seborrheic keratosis   .  Pain in limb   . Predominant disturbance of emotions   . Radiation 01/06/14, 12/23/13, 12/16/13, 12/09/13   HDR brachytherapy  . Sleep disturbance, unspecified   . Unspecified hemorrhoids without mention of complication   . Urticaria, unspecified     Past Surgical History:  Procedure Laterality Date  . AXILLARY  LYMPH NODE DISSECTION     left  . birthmark removal  childhood  . BREAST BIOPSY Left 2001   benign  . BREAST LUMPECTOMY     left underarm  . cervical cancer  1973  . CERVICAL CONIZATION W/BX    . COLONOSCOPY  12/04   int. hemorrhoids  . DEXA  03/2002   oseopenia  . DEXA  07/2004   decreased BMD, osteopenia  . DEXA  02/2007   Osteopenia  . DEXA  9/10   Osteopenia slightly worse  . felon I&D  12/2010   Dr. Fredna Dow, I&D in OR (L index finger)  . FINGER SURGERY    . KNEE SURGERY     right  . ROBOTIC ASSISTED TOTAL HYSTERECTOMY WITH BILATERAL SALPINGO OOPHERECTOMY  10/03/2013   Robotic-assisted total laparoscopic hysterectomy, bilateral salpingo-oophorectomy, bilateral pelvic and para-aortic lymphadenectomy with sentinel lymph node dissection  . TONSILECTOMY, ADENOIDECTOMY, BILATERAL MYRINGOTOMY AND TUBES    . TUBAL LIGATION      Current Outpatient Prescriptions  Medication Sig Dispense Refill  . alendronate (FOSAMAX) 70 MG tablet TAKE 1 TABLET BY MOUTH 30 MINUTES BEFOREBREAKFAST ONCE A WEEK WITH ATLEAST 8 OZ. OF WATER. 12 tablet 0  . CALCIUM PO Take 1,000 mg by mouth daily.    . cholecalciferol (VITAMIN D) 400 UNITS TABS Take 400 Units by mouth daily.     Marland Kitchen co-enzyme Q-10 30 MG capsule Take 30 mg by mouth 3 (three) times daily.    . fish oil-omega-3 fatty acids 1000 MG capsule Take by mouth 2 (two) times daily.     Marland Kitchen ibuprofen (ADVIL,MOTRIN) 200 MG tablet Take 200 mg by mouth as needed for pain. Reported on 12/15/2015    . L-Methylfolate 7.5 MG TABS Take 1 tablet (7.5 mg total) by mouth daily. 30 tablet 11  . Lactobacillus (CVS PROBIOTIC ACIDOPHILUS) 10 MG CAPS Take 1 capsule by mouth daily.     . magnesium 30 MG tablet Take 30 mg by mouth daily. Reported on 12/15/2015    . Multiple Vitamin (MULTIVITAMIN) tablet Take 1 tablet by mouth daily.    Marland Kitchen pyridOXINE (VITAMIN B-6) 100 MG tablet Take 100 mg by mouth daily.     No current facility-administered medications for this visit.      Social History   Social History  . Marital status: Married    Spouse name: N/A  . Number of children: 3  . Years of education: N/A   Occupational History  . Professor    Social History Main Topics  . Smoking status: Former Smoker    Quit date: 07/25/1963  . Smokeless tobacco: Never Used  . Alcohol use 0.6 oz/week    1 Glasses of wine per week     Comment: 1/2 glass of wine a day  . Drug use: No  . Sexual activity: Yes    Birth control/ protection: Post-menopausal   Other Topics Concern  . Not on file   Social History Narrative   History professor      Likes to dance      Is a walker and does floor exercises      Caffeine: 2 cups daily  Family History  Problem Relation Age of Onset  . Multiple myeloma Father   . Coronary artery disease Father   . Transient ischemic attack Mother   . Multiple sclerosis Brother   . Lupus Maternal Aunt   . Breast cancer Maternal Aunt     dx 44s; deceased  . Cancer Maternal Grandfather     GI cancer or pancreatic; deceased 11  . Ovarian cancer Maternal Aunt     dx 34s; deceased 88s  . Colon cancer Neg Hx       Marti Sleigh, MD 09/11/2016, 3:34 PM         Consult Note: Gyn-Onc   Veronica Ward 71 y.o. female  Chief Complaint  Patient presents with  . Endometrial Cancer    Assessment : Endometrial carcinoma (papillary serous) stage I a (March 2015). Status post 6 cycles of carboplatin Taxol chemotherapy and vaginal vault brachytherapy. Patient's clinically free of disease.  Plan:  She will see Dr. Marko Plume and Sondra Come as scheduled. I plan on seeing the patient back in August 2016. Pap smears are obtained today  Interval history: The patient returns today for continuing follow-up of endometrial carcinoma. Since her last visit with gynecologic oncology she's been seen by Dr. Marko Plume and Sondra Come. Today we she reports she's having no problems and her health is been good except for a GI virus 2 weeks ago  which cleared spontaneously. She specifically denies any GI or GU symptoms no pelvic pain pressure vaginal bleeding or discharge.  HPI: 71 year old white female seen in consultation request of Dr. Mora Bellman regarding management of a newly diagnosed endometrial cancer. Approximately 3 weeks ago the patient developed some vaginal spotting. She was seen by Dr. Elly Modena on February 25. An endometrial biopsy was obtained showing a grade 2 endometrial carcinoma. The patient is also had an ultrasound of the pelvis revealing a uterus measuring 7 x 3 x 3.7 cm. The endometrial stripe was 7 mm.  [name] She has no other gynecologic history.   Patient underwent robotic hysterectomy bilateral salpingo-oophorectomy and pelvic lymphadenectomy at [facility] on 10/03/2013. 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 Adjuvant therapy with carboplatin and Taxol and vaginal vault brachytherapy was advised. The patient had a uncomplicated postoperative course.    Review of Systems:10 point review of systems is negative except as noted in interval history.   Vitals: Blood pressure 115/74, pulse 76, temperature 99 F (37.2 C), temperature source Oral, resp. rate 18, height 5' 2.75" (1.594 m), weight 105 lb 14.4 oz (48 kg), last menstrual period 09/17/2000, SpO2 100 %.  Physical Exam: General : The patient is a healthy woman in no acute distress.  HEENT: normocephalic, extraoccular movements normal; neck is supple without thyromegally  Lynphnodes: Supraclavicular and inguinal nodes not enlarged  Abdomen: Soft, non-tender, no ascites, no organomegally, no masses, no hernias  Pelvic:  EGBUS: Normal female  Vagina: Normal, no lesions, atrophic , the cuff closure is intact and is healing appropriately. Urethra and Bladder:  Normal, non-tender  Cervix: Surgically absent Uterus: Surgically absent Bi-manual examination: Non-tender; no adenxal masses or nodularity  Rectal: normal sphincter tone, no masses, no blood  Lower extremities: No edema or varicosities. Normal range of motion      Allergies  Allergen Reactions  . Bactrim [Sulfamethoxazole-Trimethoprim] Rash  .  Benadryl [Diphenhydramine Hcl (Sleep)] Rash and Anxiety    Fever, body ache  . Oxycodone-Acetaminophen Anxiety  . Sulfa Antibiotics Rash    Past Medical History:  Diagnosis Date  . Anemia   . Anxiety   . Ashkenazi Jewish ancestry   . Diffuse cystic mastopathy   . Disorder of bone and cartilage, unspecified   . Dysautonomia    mild  . Endometrial cancer ([place])   . Headache(784.0)   . History of shingles   . Hx of basal cell carcinoma    face  . Osteoporosis   . Other malaise and fatigue   . Other psoriasis   . Other seborrheic keratosis   . Pain in limb   . Predominant disturbance of emotions   . Radiation 01/06/14, 12/23/13, 12/16/13, 12/09/13   HDR brachytherapy  . Sleep disturbance, unspecified   . Unspecified hemorrhoids without mention of complication   . Urticaria, unspecified     Past Surgical History:  Procedure Laterality Date  . AXILLARY LYMPH NODE DISSECTION     left  . birthmark removal  childhood  . BREAST BIOPSY Left 2001   benign  . BREAST LUMPECTOMY     left underarm  . cervical cancer  1973  . CERVICAL CONIZATION W/BX    . COLONOSCOPY  12/04   int. hemorrhoids  . DEXA  03/2002   oseopenia  . DEXA  07/2004   decreased BMD, osteopenia  . DEXA  02/2007   Osteopenia  . DEXA  9/10   Osteopenia slightly worse  . felon I&D  12/2010   Dr. Fredna Dow, I&D in OR (L index finger)  . FINGER SURGERY    . KNEE SURGERY     right  . ROBOTIC ASSISTED TOTAL HYSTERECTOMY WITH BILATERAL SALPINGO OOPHERECTOMY  10/03/2013   Robotic-assisted total laparoscopic hysterectomy, bilateral salpingo-oophorectomy, bilateral pelvic and  para-aortic lymphadenectomy with sentinel lymph node dissection  . TONSILECTOMY, ADENOIDECTOMY, BILATERAL MYRINGOTOMY AND TUBES    . TUBAL LIGATION      Current Outpatient Prescriptions  Medication Sig Dispense Refill  . alendronate (FOSAMAX) 70 MG tablet TAKE 1 TABLET BY MOUTH 30 MINUTES BEFOREBREAKFAST ONCE A WEEK WITH ATLEAST 8 OZ. OF WATER. 12 tablet 0  . CALCIUM PO Take 1,000 mg by mouth daily.    . cholecalciferol (VITAMIN D) 400 UNITS TABS Take 400 Units by mouth daily.     Marland Kitchen co-enzyme Q-10 30 MG capsule Take 30 mg by mouth 3 (three) times daily.    . fish oil-omega-3 fatty acids 1000 MG capsule Take by mouth 2 (two) times daily.     Marland Kitchen ibuprofen (ADVIL,MOTRIN) 200 MG tablet Take 200 mg by mouth as needed for pain. Reported on 12/15/2015    . L-Methylfolate 7.5 MG TABS Take 1 tablet (7.5 mg total) by mouth daily. 30 tablet 11  . Lactobacillus (CVS PROBIOTIC ACIDOPHILUS) 10 MG CAPS Take 1 capsule by mouth daily.     . magnesium 30 MG tablet Take 30 mg by mouth daily. Reported on 12/15/2015    . Multiple Vitamin (MULTIVITAMIN) tablet Take 1 tablet by mouth daily.    Marland Kitchen pyridOXINE (VITAMIN B-6) 100 MG tablet Take 100 mg by mouth daily.     No current facility-administered medications for this visit.     Social History   Social History  . Marital status: Married    Spouse name: N/A  . Number of children: 3  . Years of education: N/A   Occupational History  . Professor  Social History Main Topics  . Smoking status: Former Smoker    Quit date: 07/25/1963  . Smokeless tobacco: Never Used  . Alcohol use 0.6 oz/week    1 Glasses of wine per week     Comment: 1/2 glass of wine a day  . Drug use: No  . Sexual activity: Yes    Birth control/ protection: Post-menopausal   Other Topics Concern  . Not on file   Social History Narrative   History professor      Likes to dance      Is a walker and does floor exercises      Caffeine: 2 cups daily    Family History  Problem  Relation Age of Onset  . Multiple myeloma Father   . Coronary artery disease Father   . Transient ischemic attack Mother   . Multiple sclerosis Brother   . Lupus Maternal Aunt   . Breast cancer Maternal Aunt     dx 70s; deceased  . Cancer Maternal Grandfather     GI cancer or pancreatic; deceased 59  . Ovarian cancer Maternal Aunt     dx 68s; deceased 52s  . Colon cancer Neg Hx       Marti Sleigh, MD 09/11/2016, 3:34 PM

## 2016-09-11 NOTE — Patient Instructions (Signed)
Please call our office in June to schedule appointment for November 2018.  Our office will contact you with the pap results.

## 2016-09-12 ENCOUNTER — Ambulatory Visit (INDEPENDENT_AMBULATORY_CARE_PROVIDER_SITE_OTHER): Payer: 59 | Admitting: Psychology

## 2016-09-12 DIAGNOSIS — F4323 Adjustment disorder with mixed anxiety and depressed mood: Secondary | ICD-10-CM

## 2016-09-15 LAB — CYTOLOGY - PAP

## 2016-09-22 ENCOUNTER — Telehealth: Payer: Self-pay

## 2016-09-22 NOTE — Telephone Encounter (Signed)
LM in VM  Notifying Ms Shrock that her pap smear was negative. If she has any questions or concerns, she can call the [place] clinic at [phone].

## 2016-10-03 ENCOUNTER — Ambulatory Visit (INDEPENDENT_AMBULATORY_CARE_PROVIDER_SITE_OTHER): Payer: 59 | Admitting: Psychology

## 2016-10-03 DIAGNOSIS — F4323 Adjustment disorder with mixed anxiety and depressed mood: Secondary | ICD-10-CM

## 2016-10-19 ENCOUNTER — Ambulatory Visit: Payer: Self-pay

## 2016-10-22 ENCOUNTER — Telehealth: Payer: Self-pay | Admitting: Family Medicine

## 2016-10-22 DIAGNOSIS — Z0000 Encounter for general adult medical examination without abnormal findings: Secondary | ICD-10-CM

## 2016-10-22 NOTE — Telephone Encounter (Signed)
Labs

## 2016-10-25 ENCOUNTER — Ambulatory Visit (INDEPENDENT_AMBULATORY_CARE_PROVIDER_SITE_OTHER): Payer: Medicare Other

## 2016-10-25 VITALS — BP 110/74 | HR 73 | Temp 98.8°F | Ht 62.5 in | Wt 104.0 lb

## 2016-10-25 DIAGNOSIS — Z0000 Encounter for general adult medical examination without abnormal findings: Secondary | ICD-10-CM

## 2016-10-25 DIAGNOSIS — Z1159 Encounter for screening for other viral diseases: Secondary | ICD-10-CM

## 2016-10-25 LAB — LIPID PANEL
Cholesterol: 199 mg/dL (ref 0–200)
HDL: 78.7 mg/dL (ref >39.00)
LDL Cholesterol: 102 mg/dL — ABNORMAL HIGH (ref 0–99)
NonHDL: 120.68
Total CHOL/HDL Ratio: 3
Triglycerides: 93 mg/dL (ref 0.0–149.0)
VLDL: 18.6 mg/dL (ref 0.0–40.0)

## 2016-10-25 LAB — TSH: TSH: 2.57 u[IU]/mL (ref 0.35–4.50)

## 2016-10-25 LAB — COMPREHENSIVE METABOLIC PANEL
ALT: 18 U/L (ref 0–35)
AST: 23 U/L (ref 0–37)
Albumin: 4.4 g/dL (ref 3.5–5.2)
Alkaline Phosphatase: 49 U/L (ref 39–117)
BUN: 13 mg/dL (ref 6–23)
CO2: 31 mEq/L (ref 19–32)
Calcium: 9.9 mg/dL (ref 8.4–10.5)
Chloride: 101 mEq/L (ref 96–112)
Creatinine, Ser: 0.65 mg/dL (ref 0.40–1.20)
GFR: 95.04 mL/min (ref >60.00)
Glucose, Bld: 94 mg/dL (ref 70–99)
Potassium: 3.7 mEq/L (ref 3.5–5.1)
Sodium: 137 mEq/L (ref 135–145)
Total Bilirubin: 0.3 mg/dL (ref 0.2–1.2)
Total Protein: 7.2 g/dL (ref 6.0–8.3)

## 2016-10-25 LAB — CBC WITH DIFFERENTIAL/PLATELET
Basophils Absolute: 0 10*3/uL (ref 0.0–0.1)
Basophils Relative: 0.8 % (ref 0.0–3.0)
Eosinophils Absolute: 0.1 10*3/uL (ref 0.0–0.7)
Eosinophils Relative: 2.4 % (ref 0.0–5.0)
HCT: 37 % (ref 36.0–46.0)
Hemoglobin: 12.4 g/dL (ref 12.0–15.0)
Lymphocytes Relative: 28.3 % (ref 12.0–46.0)
Lymphs Abs: 1.3 10*3/uL (ref 0.7–4.0)
MCHC: 33.5 g/dL (ref 30.0–36.0)
MCV: 98.6 fl (ref 78.0–100.0)
Monocytes Absolute: 0.5 10*3/uL (ref 0.1–1.0)
Monocytes Relative: 10.4 % (ref 3.0–12.0)
Neutro Abs: 2.7 10*3/uL (ref 1.4–7.7)
Neutrophils Relative %: 58.1 % (ref 43.0–77.0)
Platelets: 193 10*3/uL (ref 150.0–400.0)
RBC: 3.75 Mil/uL — ABNORMAL LOW (ref 3.87–5.11)
RDW: 13.4 % (ref 11.5–15.5)
WBC: 4.7 10*3/uL (ref 4.0–10.5)

## 2016-10-25 NOTE — Patient Instructions (Addendum)
Ms. Mortell , Thank you for taking time to come for your Medicare Wellness Visit. I appreciate your ongoing commitment to your health goals. Please review the following plan we discussed and let me know if I can assist you in the future.   These are the goals we discussed: Goals    . Increase physical activity          Starting 10/25/2016, I will continue to exercise for at least 30-60 min 3-5 days per week.        This is a list of the screening recommended for you and due dates:  Health Maintenance  Topic Date Due  . Flu Shot  02/21/2017  . Mammogram  07/25/2017  . Tetanus Vaccine  12/28/2019  . Colon Cancer Screening  07/04/2023  . DEXA scan (bone density measurement)  Completed  .  Hepatitis C: One time screening is recommended by Center for Disease Control  (CDC) for  adults born from [birth year] through 1967.   Completed  . Pneumonia vaccines  Completed     Preventive Care for Adults  A healthy lifestyle and preventive care can promote health and wellness. Preventive health guidelines for adults include the following key practices.  . A routine yearly physical is a good way to check with your health care provider about your health and preventive screening. It is a chance to share any concerns and updates on your health and to receive a thorough exam.  . Visit your dentist for a routine exam and preventive care every 6 months. Brush your teeth twice a day and floss once a day. Good oral hygiene prevents tooth decay and gum disease.  . The frequency of eye exams is based on your age, health, family medical history, use  of contact lenses, and other factors. Follow your health care provider's ecommendations for frequency of eye exams.  . Eat a healthy diet. Foods like vegetables, fruits, whole grains, low-fat dairy products, and lean protein foods contain the nutrients you need without too many calories. Decrease your intake of foods high in solid fats, added sugars, and salt. Eat the  right amount of calories for you. Get information about a proper diet from your health care provider, if necessary.  . Regular physical exercise is one of the most important things you can do for your health. Most adults should get at least 150 minutes of moderate-intensity exercise (any activity that increases your heart rate and causes you to sweat) each week. In addition, most adults need muscle-strengthening exercises on 2 or more days a week.  Silver Sneakers may be a benefit available to you. To determine eligibility, you may visit the website: www.silversneakers.com or contact program at 650-531-1488 Mon-Fri between 8AM-8PM.   . Maintain a healthy weight. The body mass index (BMI) is a screening tool to identify possible weight problems. It provides an estimate of body fat based on height and weight. Your health care provider can find your BMI and can help you achieve or maintain a healthy weight.   For adults 20 years and older: ? A BMI below 18.5 is considered underweight. ? A BMI of 18.5 to 24.9 is normal. ? A BMI of 25 to 29.9 is considered overweight. ? A BMI of 30 and above is considered obese.   . Maintain normal blood lipids and cholesterol levels by exercising and minimizing your intake of saturated fat. Eat a balanced diet with plenty of fruit and vegetables. Blood tests for lipids and cholesterol should begin  at age 86 and be repeated every 5 years. If your lipid or cholesterol levels are high, you are over 50, or you are at high risk for heart disease, you may need your cholesterol levels checked more frequently. Ongoing high lipid and cholesterol levels should be treated with medicines if diet and exercise are not working.  . If you smoke, find out from your health care provider how to quit. If you do not use tobacco, please do not start.  . If you choose to drink alcohol, please do not consume more than 2 drinks per day. One drink is considered to be 12 ounces (355 mL) of  beer, 5 ounces (148 mL) of wine, or 1.5 ounces (44 mL) of liquor.  . If you are 60-88 years old, ask your health care provider if you should take aspirin to prevent strokes.  . Use sunscreen. Apply sunscreen liberally and repeatedly throughout the day. You should seek shade when your shadow is shorter than you. Protect yourself by wearing long sleeves, pants, a wide-brimmed hat, and sunglasses year round, whenever you are outdoors.  . Once a month, do a whole body skin exam, using a mirror to look at the skin on your back. Tell your health care provider of new moles, moles that have irregular borders, moles that are larger than a pencil eraser, or moles that have changed in shape or color.

## 2016-10-25 NOTE — Progress Notes (Signed)
PCP notes:   Health maintenance:  Hep C screening - completed  Abnormal screenings:   None  Patient concerns:   Pt has ingrown toenail on left big toe.   Nurse concerns:  None  Next PCP appt:   11/01/16 @ 1430

## 2016-10-25 NOTE — Progress Notes (Signed)
Subjective:   Veronica Ward is a 71 y.o. female who presents for Medicare Annual (Subsequent) preventive examination.  Review of Systems:  N/A Cardiac Risk Factors include: advanced age (>69mn, >>6women)     Objective:     Vitals: BP 110/74 (BP Location: Right Arm, Patient Position: Sitting, Cuff Size: Normal)   Pulse 73   Temp 98.8 F (37.1 C) (Oral)   Ht 5' 2.5" (1.588 m) Comment: no shoes  Wt 104 lb (47.2 kg)   LMP 09/17/2000   SpO2 99%   BMI 18.72 kg/m   Body mass index is 18.72 kg/m.   Tobacco History  Smoking Status  . Former Smoker  . Quit date: 07/25/1963  Smokeless Tobacco  . Never Used     Counseling given: No   Past Medical History:  Diagnosis Date  . Anemia   . Anxiety   . Ashkenazi Jewish ancestry   . Diffuse cystic mastopathy   . Disorder of bone and cartilage, unspecified   . Dysautonomia    mild  . [place]omet[place]   . Headache(784.0)   . History of shingles   . Hx of basal cell carcinoma    face  . Osteoporosis   . Other malaise and fatigue   . Other psoriasis   . Other seborrheic keratosis   . Pain in limb   . Predominant disturbance of emotions   . Radiation 01/06/14, 12/23/13, 12/16/13, 12/09/13   HDR brachytherapy  . Sleep disturbance, unspecified   . Unspecified hemorrhoids without mention of complication   . Urticaria, unspecified    Past Surgical History:  Procedure Laterality Date  . AXILLARY LYMPH NODE DISSECTION     left  . birthmark removal  childhood  . BREAST BIOPSY Left 2001   benign  . BREAST LUMPECTOMY     left underarm  . cervical cancer  1973  . CERVICAL CONIZATION W/BX    . COLONOSCOPY  12/04   int. hemorrhoids  . DEXA  03/2002   oseopenia  . DEXA  07/2004   decreased BMD, osteopenia  . DEXA  02/2007   Osteopenia  . DEXA  9/10   Osteopenia slightly worse  . felon I&D  12/2010   Dr. KFredna Dow I&D in OR (L index finger)  . FINGER SURGERY    . KNEE SURGERY     right  . ROBOTIC ASSISTED TOTAL  HYSTERECTOMY WITH BILATERAL SALPINGO OOPHERECTOMY  10/03/2013   Robotic-assisted total laparoscopic hysterectomy, bilateral salpingo-oophorectomy, bilateral pelvic and para-aortic lymphadenectomy with sentinel lymph node dissection  . TONSILECTOMY, ADENOIDECTOMY, BILATERAL MYRINGOTOMY AND TUBES    . TUBAL LIGATION     Family History  Problem Relation Age of Onset  . Multiple myeloma Father   . Coronary artery disease Father   . Transient ischemic attack Mother   . Multiple sclerosis Brother   . Lupus Maternal Aunt   . Breast cancer Maternal Aunt     dx 627s deceased  . Cancer Maternal Grandfather     GI cancer or pancreatic; deceased 425 . Ovarian cancer Maternal Aunt     dx 464s deceased 559s . Colon cancer Neg Hx    History  Sexual Activity  . Sexual activity: Yes  . Birth control/ protection: Post-menopausal    Outpatient Encounter Prescriptions as of 10/25/2016  Medication Sig  . alendronate (FOSAMAX) 70 MG tablet TAKE 1 TABLET BY MOUTH 30 MINUTES BEFOREBREAKFAST ONCE A WEEK WITH ATLEAST 8 OZ. OF WATER.  .Marland Kitchen  CALCIUM PO Take 1,000 mg by mouth daily.  . cholecalciferol (VITAMIN D) 400 UNITS TABS Take 400 Units by mouth daily.   Marland Kitchen co-enzyme Q-10 30 MG capsule Take 30 mg by mouth daily.   . fish oil-omega-3 fatty acids 1000 MG capsule Take by mouth daily.   Marland Kitchen ibuprofen (ADVIL,MOTRIN) 200 MG tablet Take 200 mg by mouth as needed for pain. Reported on 12/15/2015  . L-Methylfolate 7.5 MG TABS Take 1 tablet (7.5 mg total) by mouth daily.  . Lactobacillus (CVS PROBIOTIC ACIDOPHILUS) 10 MG CAPS Take 1 capsule by mouth daily.   . magnesium 30 MG tablet Take 30 mg by mouth daily. Reported on 12/15/2015  . Multiple Vitamin (MULTIVITAMIN) tablet Take 1 tablet by mouth daily.  Marland Kitchen pyridOXINE (VITAMIN B-6) 100 MG tablet Take 100 mg by mouth daily.   No facility-administered encounter medications on file as of 10/25/2016.     Activities of Daily Living In your present state of health, do you have  any difficulty performing the following activities: 10/25/2016  Hearing? Y  Vision? N  Difficulty concentrating or making decisions? N  Walking or climbing stairs? N  Dressing or bathing? N  Doing errands, shopping? N  Preparing Food and eating ? N  Using the Toilet? N  In the past six months, have you accidently leaked urine? N  Do you have problems with loss of bowel control? N  Managing your Medications? N  Managing your Finances? N  Housekeeping or managing your Housekeeping? N  Some recent data might be hidden    Patient Care Team: Abner Greenspan, MD as PCP - General    Assessment:    PLEASE NOTE: A Mini-Cog screen was completed. Maximum score is 20. A value of 0 denotes this part of Folstein MMSE was not completed or the patient failed this part of the Mini-Cog screening.   Mini-Cog Screening Orientation to Time - Max 5 pts Orientation to Place - Max 5 pts Registration - Max 3 pts Recall - Max 3 pts Language Repeat - Max 1 pts Language Follow 3 Step Command - Max 3 pts  Exercise Activities and Dietary recommendations Current Exercise Habits: Home exercise routine;Structured exercise class, Type of exercise: walking;Other - Veronica comments (dance), Time (Minutes): 30, Frequency (Times/Week): 3, Weekly Exercise (Minutes/Week): 90, Intensity: Moderate, Exercise limited by: None identified  Goals    . Increase physical activity          Starting 10/25/2016, I will continue to exercise for at least 30-60 min 3-5 days per week.       Fall Risk Fall Risk  10/25/2016 06/01/2016 09/20/2015 09/18/2014 04/20/2014  Falls in the past year? No Yes No No No  Number falls in past yr: - 1 - - -  Injury with Fall? - No - - -   Depression Screen PHQ 2/9 Scores 10/25/2016 06/01/2016 09/20/2015 09/18/2014  PHQ - 2 Score 0 0 0 0     Cognitive Function MMSE - Mini Mental State Exam 10/25/2016  Orientation to time 5  Orientation to Place 5  Registration 3  Attention/ Calculation 0  Recall 3    Language- name 2 objects 0  Language- repeat 1  Language- follow 3 step command 3  Language- read & follow direction 0  Write a sentence 0  Copy design 0  Total score 20     PLEASE NOTE: A Mini-Cog screen was completed. Maximum score is 20. A value of 0 denotes this part of Folstein  MMSE was not completed or the patient failed this part of the Mini-Cog screening.   Mini-Cog Screening Orientation to Time - Max 5 pts Orientation to Place - Max 5 pts Registration - Max 3 pts Recall - Max 3 pts Language Repeat - Max 1 pts Language Follow 3 Step Command - Max 3 pts     Immunization History  Administered Date(s) Administered  . Hepatitis A, Adult 04/23/2013  . Influenza Split 04/05/2011, 05/06/2012  . Influenza Whole 05/03/2007, 05/26/2009, 04/28/2010  . Influenza,inj,Quad PF,36+ Mos 04/23/2013, 04/21/2014, 04/09/2015, 05/01/2016  . Pneumococcal Conjugate-13 09/18/2014  . Pneumococcal Polysaccharide-23 04/05/2011  . Td 03/04/2002  . Zoster 03/20/2013   Screening Tests Health Maintenance  Topic Date Due  . INFLUENZA VACCINE  02/21/2017  . MAMMOGRAM  07/25/2017  . TETANUS/TDAP  12/28/2019  . COLONOSCOPY  07/04/2023  . DEXA SCAN  Completed  . Hepatitis C Screening  Completed  . PNA vac Low Risk Adult  Completed      Plan:     I have personally reviewed and addressed the Medicare Annual Wellness questionnaire and have noted the following in the patient's chart:  A. Medical and social history B. Use of alcohol, tobacco or illicit drugs  C. Current medications and supplements D. Functional ability and status E.  Nutritional status F.  Physical activity G. Advance directives H. List of other physicians I.  Hospitalizations, surgeries, and ER visits in previous 12 months J.  [place] to include hearing, vision, cognitive, depression L. Referrals and appointments - none  In addition, I have reviewed and discussed with patient certain preventive protocols,  quality metrics, and best practice recommendations. A written personalized care plan for preventive services as well as general preventive health recommendations were provided to patient.  Veronica attached scanned questionnaire for additional information.   Signed,   Lindell Noe, MHA, BS, LPN Health Coach

## 2016-10-25 NOTE — Progress Notes (Signed)
Pre visit review using our clinic review tool, if applicable. No additional management support is needed unless otherwise documented below in the visit note. 

## 2016-10-26 LAB — HEPATITIS C ANTIBODY: HCV Ab: NEGATIVE

## 2016-10-26 NOTE — Progress Notes (Signed)
I reviewed health advisor's note, was available for consultation, and agree with documentation and plan.  

## 2016-10-31 ENCOUNTER — Ambulatory Visit (INDEPENDENT_AMBULATORY_CARE_PROVIDER_SITE_OTHER): Payer: 59 | Admitting: Psychology

## 2016-10-31 DIAGNOSIS — F4323 Adjustment disorder with mixed anxiety and depressed mood: Secondary | ICD-10-CM | POA: Diagnosis not present

## 2016-11-01 ENCOUNTER — Encounter: Payer: Self-pay | Admitting: Family Medicine

## 2016-11-01 ENCOUNTER — Ambulatory Visit (INDEPENDENT_AMBULATORY_CARE_PROVIDER_SITE_OTHER): Payer: Medicare Other | Admitting: Family Medicine

## 2016-11-01 VITALS — BP 106/60 | HR 77 | Temp 98.4°F | Ht 62.5 in | Wt 106.2 lb

## 2016-11-01 DIAGNOSIS — C541 Malignant neoplasm of endometrium: Secondary | ICD-10-CM | POA: Diagnosis not present

## 2016-11-01 DIAGNOSIS — R7983 Abnormal findings of blood amino-acid level: Secondary | ICD-10-CM

## 2016-11-01 DIAGNOSIS — Z0000 Encounter for general adult medical examination without abnormal findings: Secondary | ICD-10-CM

## 2016-11-01 DIAGNOSIS — E7211 Homocystinuria: Secondary | ICD-10-CM | POA: Diagnosis not present

## 2016-11-01 DIAGNOSIS — E7219 Other disorders of sulfur-bearing amino-acid metabolism: Secondary | ICD-10-CM

## 2016-11-01 DIAGNOSIS — M810 Age-related osteoporosis without current pathological fracture: Secondary | ICD-10-CM | POA: Diagnosis not present

## 2016-11-01 MED ORDER — ALENDRONATE SODIUM 70 MG PO TABS: ORAL_TABLET | ORAL | 3 refills | Status: DC

## 2016-11-01 NOTE — Patient Instructions (Signed)
Take care of yourself  Stay active and keep dancing  Make sure you are getting enough calories   Think about yoga again - there are lots of good videos to do at home  Stretching is very good for you  Get outdoors when you can

## 2016-11-01 NOTE — Assessment & Plan Note (Signed)
Rev dexa 2/17 -now osteopenia with mixed trend  On fosamax -tolerating well  Takes ca and D and exercises  Continue to follow  dexa every 2 y

## 2016-11-01 NOTE — Progress Notes (Signed)
Subjective:    Patient ID: Veronica Ward, female    DOB: [birth year], 71 y.o.   MRN: [id number]  HPI Here for health maintenance exam and to review chronic medical problems   Getting over a virus  Feeling better today  Was more fatigued this winter   Wt Readings from Last 3 Encounters:  11/01/16 106 lb 4 oz (48.2 kg)  10/25/16 104 lb (47.2 kg)  09/11/16 105 lb 14.4 oz (48 kg)  her weight is up and down  Has always been underweight  Eats well also  bmi 19.1  AMW on 4/4  No concerns utd screening and imms  Mammogram 1/18 negative Self breast exam- no breast lumps  Needs a breast exam  Hx of endometrial cancer with hysterectomy and treatment Doing well /nothing new  Doing regular paps every 6 months with her gyn/onc   Colonoscopy 12/14-normal Most likely will not need another one   dexa 2/17- osteopenia with a mixed trend  On fosamax- tolerates it  Ca and D History of foot fracture  Hep c screen neg this mo  Results for orders placed or performed in visit on 10/25/16  CBC with Differential/Platelet  Result Value Ref Range   WBC 4.7 4.0 - 10.5 K/uL   RBC 3.75 (L) 3.87 - 5.11 Mil/uL   Hemoglobin 12.4 12.0 - 15.0 g/dL   HCT 37.0 36.0 - 46.0 %   MCV 98.6 78.0 - 100.0 fl   MCHC 33.5 30.0 - 36.0 g/dL   RDW 13.4 11.5 - 15.5 %   Platelets 193.0 150.0 - 400.0 K/uL   Neutrophils Relative % 58.1 43.0 - 77.0 %   Lymphocytes Relative 28.3 12.0 - 46.0 %   Monocytes Relative 10.4 3.0 - 12.0 %   Eosinophils Relative 2.4 0.0 - 5.0 %   Basophils Relative 0.8 0.0 - 3.0 %   Neutro Abs 2.7 1.4 - 7.7 K/uL   Lymphs Abs 1.3 0.7 - 4.0 K/uL   Monocytes Absolute 0.5 0.1 - 1.0 K/uL   Eosinophils Absolute 0.1 0.0 - 0.7 K/uL   Basophils Absolute 0.0 0.0 - 0.1 K/uL  Comprehensive metabolic panel  Result Value Ref Range   Sodium 137 135 - 145 mEq/L   Potassium 3.7 3.5 - 5.1 mEq/L   Chloride 101 96 - 112 mEq/L   CO2 31 19 - 32 mEq/L   Glucose, Bld 94 70 - 99 mg/dL   BUN 13 6 - 23  mg/dL   Creatinine, Ser 0.65 0.40 - 1.20 mg/dL   Total Bilirubin 0.3 0.2 - 1.2 mg/dL   Alkaline Phosphatase 49 39 - 117 U/L   AST 23 0 - 37 U/L   ALT 18 0 - 35 U/L   Total Protein 7.2 6.0 - 8.3 g/dL   Albumin 4.4 3.5 - 5.2 g/dL   Calcium 9.9 8.4 - 10.5 mg/dL   GFR 95.04 >60.00 mL/min  Lipid panel  Result Value Ref Range   Cholesterol 199 0 - 200 mg/dL   Triglycerides 93.0 0.0 - 149.0 mg/dL   HDL 78.70 >39.00 mg/dL   VLDL 18.6 0.0 - 40.0 mg/dL   LDL Cholesterol 102 (H) 0 - 99 mg/dL   Total CHOL/HDL Ratio 3    NonHDL 120.68   TSH  Result Value Ref Range   TSH 2.57 0.35 - 4.50 uIU/mL  Hepatitis C antibody  Result Value Ref Range   HCV Ab NEGATIVE NEGATIVE    Cholesterol is very good !    Still  dancing frequently for exercise   Patient Active Problem List   Diagnosis Date Noted  . Foot fracture 07/10/2016  . Left foot pain 07/07/2016  . Homocysteinemia ([place]) 05/09/2016  . Fatigue 01/26/2016  . Estrogen deficiency 07/21/2015  . Left shoulder pain 09/29/2014  . Ashkenazi Jewish ancestry   . Endometrial cancer ([place]) 11/03/2013  . History of uterine cancer 10/28/2013  . CIS (carcinoma in situ of cervix) 09/23/2013  . Encounter for Medicare annual wellness exam 03/12/2013  . Colon cancer screening 03/12/2013  . Routine general medical examination at a health care facility 03/03/2013  . Encounter for medication monitoring 11/23/2010  . SEBORRHEIC KERATOSIS 08/25/2009  . STRESS REACTION, ACUTE, WITH EMOTIONAL DISTURBANCE 12/09/2008  . SLEEP DISORDER 12/09/2008  . HEMORRHOIDS 10/12/2008  . Osteoporosis 10/08/2007  . BASAL CELL CARCINOMA, FACE 06/21/2007  . FIBROCYSTIC BREAST DISEASE 06/21/2007  . PSORIASIS 06/21/2007  . URTICARIA 06/21/2007  . BASAL CELL CARCINOMA, FACE 06/21/2007   Past Medical History:  Diagnosis Date  . Anemia   . Anxiety   . Ashkenazi Jewish ancestry   . Diffuse cystic mastopathy   . Disorder of bone and cartilage, unspecified   . Dysautonomia     mild  . Endometrial cancer ([place])   . Headache(784.0)   . History of shingles   . Hx of basal cell carcinoma    face  . Osteoporosis   . Other malaise and fatigue   . Other psoriasis   . Other seborrheic keratosis   . Pain in limb   . Predominant disturbance of emotions   . Radiation 01/06/14, 12/23/13, 12/16/13, 12/09/13   HDR brachytherapy  . Sleep disturbance, unspecified   . Unspecified hemorrhoids without mention of complication   . Urticaria, unspecified    Past Surgical History:  Procedure Laterality Date  . AXILLARY LYMPH NODE DISSECTION     left  . birthmark removal  childhood  . BREAST BIOPSY Left 2001   benign  . BREAST LUMPECTOMY     left underarm  . cervical cancer  1973  . CERVICAL CONIZATION W/BX    . COLONOSCOPY  12/04   int. hemorrhoids  . DEXA  03/2002   oseopenia  . DEXA  07/2004   decreased BMD, osteopenia  . DEXA  02/2007   Osteopenia  . DEXA  9/10   Osteopenia slightly worse  . felon I&D  12/2010   Dr. Fredna Dow, I&D in OR (L index finger)  . FINGER SURGERY    . KNEE SURGERY     right  . ROBOTIC ASSISTED TOTAL HYSTERECTOMY WITH BILATERAL SALPINGO OOPHERECTOMY  10/03/2013   Robotic-assisted total laparoscopic hysterectomy, bilateral salpingo-oophorectomy, bilateral pelvic and para-aortic lymphadenectomy with sentinel lymph node dissection  . TONSILECTOMY, ADENOIDECTOMY, BILATERAL MYRINGOTOMY AND TUBES    . TUBAL LIGATION     Social History  Substance Use Topics  . Smoking status: Former Smoker    Quit date: 07/25/1963  . Smokeless tobacco: Never Used  . Alcohol use 0.6 oz/week    1 Glasses of wine per week     Comment: 1/2 glass of wine a day   Family History  Problem Relation Age of Onset  . Multiple myeloma Father   . Coronary artery disease Father   . Transient ischemic attack Mother   . Multiple sclerosis Brother   . Lupus Maternal Aunt   . Breast cancer Maternal Aunt     dx 28s; deceased  . Cancer Maternal Grandfather     GI cancer or  pancreatic; deceased 50  . Ovarian cancer Maternal Aunt     dx 88s; deceased 91s  . Colon cancer Neg Hx    Allergies  Allergen Reactions  . Bactrim [Sulfamethoxazole-Trimethoprim] Rash  . Benadryl [Diphenhydramine Hcl (Sleep)] Rash and Anxiety    Fever, body ache  . Oxycodone-Acetaminophen Anxiety  . Sulfa Antibiotics Rash   Current Outpatient Prescriptions on File Prior to Visit  Medication Sig Dispense Refill  . alendronate (FOSAMAX) 70 MG tablet TAKE 1 TABLET BY MOUTH 30 MINUTES BEFOREBREAKFAST ONCE A WEEK WITH ATLEAST 8 OZ. OF WATER. 12 tablet 0  . CALCIUM PO Take 1,000 mg by mouth daily.    . cholecalciferol (VITAMIN D) 400 UNITS TABS Take 400 Units by mouth daily.     Marland Kitchen co-enzyme Q-10 30 MG capsule Take 30 mg by mouth daily.     . fish oil-omega-3 fatty acids 1000 MG capsule Take by mouth daily.     Marland Kitchen ibuprofen (ADVIL,MOTRIN) 200 MG tablet Take 200 mg by mouth as needed for pain. Reported on 12/15/2015    . L-Methylfolate 7.5 MG TABS Take 1 tablet (7.5 mg total) by mouth daily. 30 tablet 11  . Lactobacillus (CVS PROBIOTIC ACIDOPHILUS) 10 MG CAPS Take 1 capsule by mouth daily.     . magnesium 30 MG tablet Take 30 mg by mouth daily. Reported on 12/15/2015    . Multiple Vitamin (MULTIVITAMIN) tablet Take 1 tablet by mouth daily.    Marland Kitchen pyridOXINE (VITAMIN B-6) 100 MG tablet Take 100 mg by mouth daily.     No current facility-administered medications on file prior to visit.      Review of Systems Review of Systems  Constitutional: Negative for fever, appetite change, fatigue and unexpected weight change.  Eyes: Negative for pain and visual disturbance.  Respiratory: Negative for cough and shortness of breath.   Cardiovascular: Negative for cp or palpitations    Gastrointestinal: Negative for nausea, diarrhea and constipation.  Genitourinary: Negative for urgency and frequency.  Skin: Negative for pallor or rash   MSK pos for foot pain from prior injury that is improved    Neurological: Negative for weakness, light-headedness, numbness and headaches.  Hematological: Negative for adenopathy. Does not bruise/bleed easily.  Psychiatric/Behavioral: Negative for dysphoric mood. The patient is not nervous/anxious.         Objective:   Physical Exam  Constitutional: She appears well-developed and well-nourished. No distress.  Well appearing  slim  HENT:  Head: Normocephalic and atraumatic.  Right Ear: External ear normal.  Left Ear: External ear normal.  Mouth/Throat: Oropharynx is clear and moist.  Eyes: Conjunctivae and EOM are normal. Pupils are equal, round, and reactive to light. No scleral icterus.  Neck: Normal range of motion. Neck supple. No JVD present. Carotid bruit is not present. No thyromegaly present.  Cardiovascular: Normal rate, regular rhythm, normal heart sounds and intact distal pulses.  Exam reveals no gallop.   Pulmonary/Chest: Effort normal and breath sounds normal. No respiratory distress. She has no wheezes. She exhibits no tenderness.  Abdominal: Soft. Bowel sounds are normal. She exhibits no distension, no abdominal bruit and no mass. There is no tenderness.  Genitourinary: No breast swelling, tenderness, discharge or bleeding.  Genitourinary Comments: Breast exam: No mass, nodules, thickening, tenderness, bulging, retraction, inflamation, nipple discharge or skin changes noted.  No axillary or clavicular LA.      Musculoskeletal: Normal range of motion. She exhibits no edema or tenderness.  No kyphosis   Lymphadenopathy:  She has no cervical adenopathy.  Neurological: She is alert. She has normal reflexes. No cranial nerve deficit. She exhibits normal muscle tone. Coordination normal.  Skin: Skin is warm and dry. No rash noted. No erythema. No pallor.  Lentigines noted    Psychiatric: She has a normal mood and affect.          Assessment & Plan:   Problem List Items Addressed This Visit      Musculoskeletal and  Integument   Osteoporosis - Primary    Rev dexa 2/17 -now osteopenia with mixed trend  On fosamax -tolerating well  Takes ca and D and exercises  Continue to follow  dexa every 2 y       Relevant Medications   alendronate (FOSAMAX) 70 MG tablet     Genitourinary   Endometrial cancer ([place])     Other   Homocysteinemia ([place])    Reviewed last labs  Continues meythlyfolate      Routine general medical examination at a health care facility    Reviewed health habits including diet and exercise and skin cancer prevention Reviewed appropriate screening tests for age  Also reviewed health mt list, fam hx and immunization status , as well as social and family history   Rev AMW  Rev wellness labs  Screenings negative  dexa utd

## 2016-11-01 NOTE — Progress Notes (Signed)
Pre visit review using our clinic review tool, if applicable. No additional management support is needed unless otherwise documented below in the visit note. 

## 2016-11-02 NOTE — Assessment & Plan Note (Signed)
Reviewed health habits including diet and exercise and skin cancer prevention Reviewed appropriate screening tests for age  Also reviewed health mt list, fam hx and immunization status , as well as social and family history   Rev AMW  Rev wellness labs  Screenings negative  dexa utd

## 2016-11-02 NOTE — Assessment & Plan Note (Signed)
Reviewed last labs  Continues meythlyfolate

## 2016-11-24 ENCOUNTER — Ambulatory Visit (INDEPENDENT_AMBULATORY_CARE_PROVIDER_SITE_OTHER): Payer: 59 | Admitting: Psychology

## 2016-11-24 DIAGNOSIS — F4323 Adjustment disorder with mixed anxiety and depressed mood: Secondary | ICD-10-CM

## 2016-11-30 ENCOUNTER — Ambulatory Visit: Payer: Medicare Other | Admitting: Radiation Oncology

## 2016-12-11 ENCOUNTER — Ambulatory Visit
Admission: RE | Admit: 2016-12-11 | Discharge: 2016-12-11 | Disposition: A | Discharge status: Home / Self Care | Payer: Medicare Other | Source: Ambulatory Visit | Attending: Radiation Oncology | Admitting: Radiation Oncology

## 2016-12-11 ENCOUNTER — Encounter: Payer: Self-pay | Admitting: Radiation Oncology

## 2016-12-11 DIAGNOSIS — Z923 Personal history of irradiation: Secondary | ICD-10-CM | POA: Diagnosis not present

## 2016-12-11 DIAGNOSIS — Z882 Allergy status to sulfonamides status: Secondary | ICD-10-CM | POA: Insufficient documentation

## 2016-12-11 DIAGNOSIS — F329 Major depressive disorder, single episode, unspecified: Secondary | ICD-10-CM | POA: Insufficient documentation

## 2016-12-11 DIAGNOSIS — R350 Frequency of micturition: Secondary | ICD-10-CM | POA: Insufficient documentation

## 2016-12-11 DIAGNOSIS — Z79899 Other long term (current) drug therapy: Secondary | ICD-10-CM | POA: Insufficient documentation

## 2016-12-11 DIAGNOSIS — C541 Malignant neoplasm of endometrium: Secondary | ICD-10-CM | POA: Insufficient documentation

## 2016-12-11 NOTE — Progress Notes (Signed)
Veronica Ward is here for follow up.  She denies having pain.  She continues to report having urinary frequency which she thinks is due to anxiety.  She denies having any bowel issues.  She reports having a small amount of vaginal bleeding after intercourse 2 days ago.  She reports feeling occasional depression.  She reports her energy level comes and goes.   BP 134/69 (BP Location: Right Arm, Patient Position: Sitting)   Pulse 72   Temp 99.1 F (37.3 C) (Oral)   Ht 5' 2.5" (1.588 m)   Wt 105 lb (47.6 kg)   LMP 09/17/2000   SpO2 100%   BMI 18.90 kg/m

## 2016-12-11 NOTE — Progress Notes (Signed)
Radiation Oncology         [phone] ________________________________  Name: Veronica Ward MRN: [id number]  Date: 12/11/2016  DOB: [birth year]  Follow-Up Visit Note  CC: Tower, Wynelle Fanny, MD  Marti Sleigh,*   Diagnosis:  Endometrial cancer, serous carcinoma , stage IA  Interval Since Last Radiation: 2 years 11 months  May 19, May 26th, June 2, June 9, January 06, 2014: Proximal vagina, 30 gray in 5 fractions, 4.0 cm treatment length, vaginal brachytherapy  Narrative:  The patient returns today for routine follow-up.  She reports urinary frequency which she thinks is due to anxiety. She denies pain, or bowl issues. She reports a scant amount of vaginal bleeding after intercourse 2 days ago. She reports occasional depression and a varying energy level.  ALLERGIES:  is allergic to bactrim [sulfamethoxazole-trimethoprim]; benadryl [diphenhydramine hcl (sleep)]; oxycodone-acetaminophen; and sulfa antibiotics.  Meds: Current Outpatient Prescriptions  Medication Sig Dispense Refill  . alendronate (FOSAMAX) 70 MG tablet TAKE 1 TABLET BY MOUTH 30 MINUTES BEFOREBREAKFAST ONCE A WEEK WITH ATLEAST 8 OZ. OF WATER. 12 tablet 3  . Ascorbic Acid (VITAMIN C PO) Take 1 tablet by mouth daily.    Marland Kitchen CALCIUM PO Take 1,000 mg by mouth daily.    . cholecalciferol (VITAMIN D) 400 UNITS TABS Take 400 Units by mouth daily.     Marland Kitchen co-enzyme Q-10 30 MG capsule Take 30 mg by mouth daily.     . fish oil-omega-3 fatty acids 1000 MG capsule Take by mouth daily.     Marland Kitchen ibuprofen (ADVIL,MOTRIN) 200 MG tablet Take 200 mg by mouth as needed for pain. Reported on 12/15/2015    . L-Methylfolate 7.5 MG TABS Take 1 tablet (7.5 mg total) by mouth daily. 30 tablet 11  . Lactobacillus (CVS PROBIOTIC ACIDOPHILUS) 10 MG CAPS Take 1 capsule by mouth daily.     . magnesium 30 MG tablet Take 30 mg by mouth daily. Reported on 12/15/2015    . Multiple Vitamin (MULTIVITAMIN) tablet Take 1 tablet by mouth daily.    Marland Kitchen  pyridOXINE (VITAMIN B-6) 100 MG tablet Take 100 mg by mouth daily.     No current facility-administered medications for this encounter.     Physical Findings: The patient is in no acute distress. Patient is alert and oriented.  height is 5' 2.5" (1.588 m) and weight is 105 lb (47.6 kg). Her oral temperature is 99.1 F (37.3 C). Her blood pressure is 134/69 and her pulse is 72. Her oxygen saturation is 100%.   The lungs are clear to auscultation. The heart has regular rhythm and rate. The abdomen is soft nontender with normal bowel sounds. No inguinal adenopathy appreciated. On pelvic examination the external genitalia are unremarkable. A speculum exam is performed. There are no mucosal lesions noted in the vaginal vault. On bimanual and rectovaginal examination no pelvic masses were appreciated.   Lab Findings: Lab Results  Component Value Date   WBC 4.7 10/25/2016   HGB 12.4 10/25/2016   HCT 37.0 10/25/2016   MCV 98.6 10/25/2016   PLT 193.0 10/25/2016    Radiographic Findings: No results found.  Impression:  No evidence recurrence on clinical exam today.   Plan: Patient will follow-up with Dr. Fermin Schwab in November. She will follow-up with radiation oncology in one year. ____________________________________  Blair Promise, PhD, MD  This document serves as a record of services personally performed by Gery Pray, MD. It was created on his behalf by Bethann Humble, a trained medical  scribe. The creation of this record is based on the scribe's personal observations and the provider's statements to them. This document has been checked and approved by the attending provider.   

## 2016-12-14 ENCOUNTER — Ambulatory Visit (INDEPENDENT_AMBULATORY_CARE_PROVIDER_SITE_OTHER): Payer: 59 | Admitting: Psychology

## 2016-12-14 DIAGNOSIS — F4323 Adjustment disorder with mixed anxiety and depressed mood: Secondary | ICD-10-CM | POA: Diagnosis not present

## 2017-01-12 ENCOUNTER — Ambulatory Visit: Payer: 59 | Admitting: Psychology

## 2017-01-15 ENCOUNTER — Ambulatory Visit (INDEPENDENT_AMBULATORY_CARE_PROVIDER_SITE_OTHER): Payer: 59 | Admitting: Psychology

## 2017-01-15 DIAGNOSIS — F4323 Adjustment disorder with mixed anxiety and depressed mood: Secondary | ICD-10-CM

## 2017-03-06 ENCOUNTER — Encounter: Payer: Self-pay | Admitting: *Deleted

## 2017-03-06 ENCOUNTER — Telehealth: Payer: Self-pay

## 2017-03-06 NOTE — Telephone Encounter (Signed)
Veronica Ward is experiencing some anxiety not being seen by practitioner  for 6 months now as she has graduated from q 3 moth intervals.  She is not sure if some of the symptoms she is feeing is normal or possible reoccurrence. 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  She is not sleeping well. Offered for her to see Veronica John, NP this Thursday for exam.  Pt will come in at 1100 on 03-08-17 to see Veronica Ward.  Pt appreciative of appointment.

## 2017-03-06 NOTE — Progress Notes (Signed)
[place] Work  Clinical Social Work received phone call from patient expressing symptoms of anxiety.  Clinical Social Worker explored patient's symptoms of anxiety and possible stressors to offer support and assess for needs.  CSW normalized patient's feelings of anxiety surrounding fear of recurrence as well as balancing life stressors as a cancer survivor.  CSW suggested several coping skills/techniques to try at home.  Patient also being seen by a counselor regularly for anxiety.  CSW notified Joylene John, GYN NP, regarding patient's anxiety surrounding follow up appointments and possible symptoms of recurrence.  Maryjean Morn, MSW, LCSW, OSW-C Clinical Social Worker [facility] [phone]

## 2017-03-08 ENCOUNTER — Ambulatory Visit: Payer: Medicare Other | Attending: Gynecologic Oncology | Admitting: Gynecologic Oncology

## 2017-03-08 ENCOUNTER — Encounter: Payer: Self-pay | Admitting: Gynecologic Oncology

## 2017-03-08 VITALS — BP 117/62 | HR 73 | Temp 98.2°F | Resp 20 | Wt 105.1 lb

## 2017-03-08 DIAGNOSIS — Z87891 Personal history of nicotine dependence: Secondary | ICD-10-CM | POA: Diagnosis not present

## 2017-03-08 DIAGNOSIS — Z85828 Personal history of other malignant neoplasm of skin: Secondary | ICD-10-CM | POA: Diagnosis not present

## 2017-03-08 DIAGNOSIS — Z9221 Personal history of antineoplastic chemotherapy: Secondary | ICD-10-CM | POA: Diagnosis not present

## 2017-03-08 DIAGNOSIS — Z882 Allergy status to sulfonamides status: Secondary | ICD-10-CM | POA: Insufficient documentation

## 2017-03-08 DIAGNOSIS — Z79899 Other long term (current) drug therapy: Secondary | ICD-10-CM | POA: Diagnosis not present

## 2017-03-08 DIAGNOSIS — C541 Malignant neoplasm of endometrium: Secondary | ICD-10-CM | POA: Insufficient documentation

## 2017-03-08 DIAGNOSIS — Z8542 Personal history of malignant neoplasm of other parts of uterus: Secondary | ICD-10-CM

## 2017-03-08 DIAGNOSIS — F419 Anxiety disorder, unspecified: Secondary | ICD-10-CM | POA: Insufficient documentation

## 2017-03-08 NOTE — Patient Instructions (Signed)
No evidence of recurrence.  Plan to follow up in Nov or sooner if needed.

## 2017-03-09 ENCOUNTER — Encounter: Payer: Self-pay | Admitting: Gynecologic Oncology

## 2017-03-09 NOTE — Progress Notes (Signed)
Follow Up Note: Gyn-Onc  Buena Irish 71 y.o. female  CC:  Chief Complaint  Patient presents with  . Endometrial Cancer    Follow up    HPI:  Veronica Ward is a 71 year old female initially seen in consultation at the request of Dr. Mora Bellman regarding management of a newly diagnosed endometrial cancer. She was seen by Dr. Elly Modena on September 17, 2013 for evaluation of vaginal bleeding for 2-3 weeks. An endometrial biopsy was obtained showing a grade 2 endometrial carcinoma. An ultrasound of the pelvis revealed a uterus measuring 7 x 3 x 3.7 cm with an endometrial stripe of 7 mm.  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 She reports all subsequent pap smears have been normal over 40 years with no other gynecologic history reported.   She underwent a robotic hysterectomy, bilateral salpingo-oophorectomy, pelvic lymphadenectomy at [facility] on 10/03/2013. 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 Adjuvant therapy with carboplatin and Taxol and vaginal vault brachytherapy was given and completed in August 2015.  She was last seen by Dr. Fermin Schwab on 09/11/16 with no evidence of recurrence at that time.   Interval History:  Veronica Ward is seen today alone for follow up.  She had been advised at her last visit that she could be seen every six months but she has been very anxious and wanted to be seen around the 3 month mark.  She states she has had so many things going on including her daughter going through a divorce.  She has not been sleeping.  She states she has trouble falling asleep and staying asleep especially since her husband has to get up during the night several times.  She tried melatonin once but stopped due to the fear of dependence.  She has started only using her bedroom for sleep and reports some improvement with the change in routine.  She has been working on getting her book published  along with conceptualizing her next book.  She has not been participating in things that provide pleasure/ stress relief including playing the piano or daily walks with her husband.  She states she cannot play the piano because it needs to be tuned and she has not walked because she has been so tired.  She tries to take dancing lessons once a week but states it is a far drive and she worries about the weather.  She reports heightened arousal stating "I feel like a teenage girl."  She also reports increased vaginal moisture.  No vaginal bleeding reported.  She has been concerned about her physical health and the cancer coming back.  She has been to support group but states it is overwhelming listening to the symptoms and concerns of patients going through issues with ovarian cancer.  She does not want to take medication for her anxiety and reports seeing a therapist in the past, last 2 months ago, but states they only talk about her book.  She reports feeling bloated intermittently but reports eating a diet rich in vegetables.  Denies changes in her appetite and states she eats small amounts.  She states she feels most of her symptoms are probably just in her mind.        Review of Systems  Constitutional: Feels anxious, stressed.  Reports fullness after evening meal but states this is not a change for her.  Stating she was 89 when she moved to [facility]  and now she is 89. Cardiovascular: No chest pain, shortness of breath, or edema.  Pulmonary: No cough or wheeze.  Gastrointestinal: No nausea, vomiting, or diarrhea. No bright red blood per rectum or change in bowel movement.  Genitourinary: No frequency, urgency, or dysuria. No vaginal bleeding or discharge.  States vagina is very moist at times.  Musculoskeletal: No myalgia or joint pain. Neurologic: No weakness, numbness, or change in gait.  Psychology: Anxious and positive for insomnia.  Only sleeping 3-4 hours a night.  Difficulty falling asleep,  staying asleep.  Health Maintenance: Mammogram: Up to date Colonoscopy: Up to date per pt and stating she was advised she would not need any further colonoscopies in the future due to age  Current Meds:  Outpatient Encounter Prescriptions as of 03/08/2017  Medication Sig  . alendronate (FOSAMAX) 70 MG tablet TAKE 1 TABLET BY MOUTH 30 MINUTES BEFOREBREAKFAST ONCE A WEEK WITH ATLEAST 8 OZ. OF WATER.  . Ascorbic Acid (VITAMIN C PO) Take 1 tablet by mouth daily.  Marland Kitchen CALCIUM PO Take 1,000 mg by mouth daily.  . cholecalciferol (VITAMIN D) 400 UNITS TABS Take 400 Units by mouth daily.   Marland Kitchen co-enzyme Q-10 30 MG capsule Take 30 mg by mouth daily.   . fish oil-omega-3 fatty acids 1000 MG capsule Take by mouth daily.   Marland Kitchen ibuprofen (ADVIL,MOTRIN) 200 MG tablet Take 200 mg by mouth as needed for pain. Reported on 12/15/2015  . L-Methylfolate 7.5 MG TABS Take 1 tablet (7.5 mg total) by mouth daily.  . Lactobacillus (CVS PROBIOTIC ACIDOPHILUS) 10 MG CAPS Take 1 capsule by mouth daily.   . magnesium 30 MG tablet Take 30 mg by mouth daily. Reported on 12/15/2015  . Multiple Vitamin (MULTIVITAMIN) tablet Take 1 tablet by mouth daily.  Marland Kitchen pyridOXINE (VITAMIN B-6) 100 MG tablet Take 100 mg by mouth daily.   No facility-administered encounter medications on file as of 03/08/2017.     Allergy:  Allergies  Allergen Reactions  . Bactrim [Sulfamethoxazole-Trimethoprim] Rash  . Benadryl [Diphenhydramine Hcl (Sleep)] Rash and Anxiety    Fever, body ache  . Oxycodone-Acetaminophen Anxiety  . Sulfa Antibiotics Rash    Social Hx:   Social History   Social History  . Marital status: Married    Spouse name: N/A  . Number of children: 3  . Years of education: N/A   Occupational History  . Professor    Social History Main Topics  . Smoking status: Former Smoker    Quit date: 07/25/1963  . Smokeless tobacco: Never Used  . Alcohol use 0.6 oz/week    1 Glasses of wine per week     Comment: 1/2 glass of wine a  day  . Drug use: No  . Sexual activity: Yes    Birth control/ protection: Post-menopausal   Other Topics Concern  . Not on file   Social History Narrative   History professor      Likes to dance      Is a walker and does floor exercises      Caffeine: 2 cups daily    Past Surgical Hx:  Past Surgical History:  Procedure Laterality Date  . AXILLARY LYMPH NODE DISSECTION     left  . birthmark removal  childhood  . BREAST BIOPSY Left 2001   benign  . BREAST LUMPECTOMY     left underarm  . cervical cancer  1973  . CERVICAL CONIZATION W/BX    . COLONOSCOPY  12/04   int.  hemorrhoids  . DEXA  03/2002   oseopenia  . DEXA  07/2004   decreased BMD, osteopenia  . DEXA  02/2007   Osteopenia  . DEXA  9/10   Osteopenia slightly worse  . felon I&D  12/2010   Dr. Fredna Dow, I&D in OR (L index finger)  . FINGER SURGERY    . KNEE SURGERY     right  . ROBOTIC ASSISTED TOTAL HYSTERECTOMY WITH BILATERAL SALPINGO OOPHERECTOMY  10/03/2013   Robotic-assisted total laparoscopic hysterectomy, bilateral salpingo-oophorectomy, bilateral pelvic and para-aortic lymphadenectomy with sentinel lymph node dissection  . TONSILECTOMY, ADENOIDECTOMY, BILATERAL MYRINGOTOMY AND TUBES    . TUBAL LIGATION      Past Medical Hx:  Past Medical History:  Diagnosis Date  . Anemia   . Anxiety   . Ashkenazi Jewish ancestry   . Diffuse cystic mastopathy   . Disorder of bone and cartilage, unspecified   . Dysautonomia    mild  . Endometrial cancer ([place])   . Headache(784.0)   . History of shingles   . Hx of basal cell carcinoma    face  . Osteoporosis   . Other malaise and fatigue   . Other psoriasis   . Other seborrheic keratosis   . Pain in limb   . Predominant disturbance of emotions   . Radiation 01/06/14, 12/23/13, 12/16/13, 12/09/13   HDR brachytherapy  . Sleep disturbance, unspecified   . Unspecified hemorrhoids without mention of complication   . Urticaria, unspecified     Family Hx:  Family  History  Problem Relation Age of Onset  . Multiple myeloma Father   . Coronary artery disease Father   . Transient ischemic attack Mother   . Multiple sclerosis Brother   . Lupus Maternal Aunt   . Breast cancer Maternal Aunt        dx 5s; deceased  . Cancer Maternal Grandfather        GI cancer or pancreatic; deceased 86  . Ovarian cancer Maternal Aunt        dx 3s; deceased 27s  . Colon cancer Neg Hx     Vitals:  Blood pressure 117/62, pulse 73, temperature 98.2 F (36.8 C), temperature source Oral, resp. rate 20, weight 105 lb 1.6 oz (47.7 kg), last menstrual period 09/17/2000, SpO2 99 %.  Physical Exam:  General: Well developed, well nourished female in no acute distress. Alert and oriented x 3.  Head/Neck: Supple without any enlargements.  Lymph node survey: No cervical, supraclavicular, or inguinal adenopathy.  Cardiovascular: Regular rate and rhythm. S1 and S2 normal.  Lungs: Clear to auscultation bilaterally. No wheezes/crackles/rhonchi noted.  Skin: No rashes or lesions present. Back: No CVA tenderness.  Abdomen: Abdomen soft, non-tender and non-obese. Active bowel sounds in all quadrants. No evidence of a fluid wave or abdominal masses.  Genitourinary:    Vulva/vagina: Normal external female genitalia. No lesions.    Urethra: No lesions or masses    Vagina: Mildly atrophic without any lesions. No palpable masses. No vaginal bleeding or drainage noted.  Rectal: Good tone, no masses, no cul de sac nodularity.  Small hemorrhoid noted on anterior aspect on the anus.  Extremities: No bilateral cyanosis, edema, or clubbing.   Assessment/Plan:  71 year old with Stage 1A papillary serous carcinoma of the endometrium with completion of adjuvant therapy with carboplatin and Taxol and vaginal vault brachytherapy in August 2015.  No evidence of recurrence today on examination.  Advised patient to re-introduce pleasurable activities into her routine  including walking and piano  playing.  Over 30 minutes spend discussing ways to de-stress and manage daily tasks.  She is advised to limit caffeine use.  She is advised to follow up with her therapist and change to another provider if necessary if she feels her needs are not met.  Reportable signs and symptoms reviewed.  She is advised to follow up with Dr. Fermin Schwab in November 2018 or sooner if needed.  She has also been in contact with the social worker who offered support and services provided through the [place].     CROSS, MELISSA DEAL, NP 03/09/2017, 2:25 PM

## 2017-03-23 ENCOUNTER — Ambulatory Visit (INDEPENDENT_AMBULATORY_CARE_PROVIDER_SITE_OTHER): Payer: 59 | Admitting: Psychology

## 2017-03-23 DIAGNOSIS — F4323 Adjustment disorder with mixed anxiety and depressed mood: Secondary | ICD-10-CM | POA: Diagnosis not present

## 2017-04-16 ENCOUNTER — Ambulatory Visit (INDEPENDENT_AMBULATORY_CARE_PROVIDER_SITE_OTHER): Payer: 59 | Admitting: Psychology

## 2017-04-16 DIAGNOSIS — F4323 Adjustment disorder with mixed anxiety and depressed mood: Secondary | ICD-10-CM | POA: Diagnosis not present

## 2017-04-24 ENCOUNTER — Ambulatory Visit: Payer: Self-pay

## 2017-05-01 ENCOUNTER — Ambulatory Visit (INDEPENDENT_AMBULATORY_CARE_PROVIDER_SITE_OTHER): Payer: Medicare Other

## 2017-05-01 DIAGNOSIS — Z23 Encounter for immunization: Secondary | ICD-10-CM

## 2017-05-03 ENCOUNTER — Ambulatory Visit: Payer: Self-pay | Admitting: Psychology

## 2017-05-04 ENCOUNTER — Ambulatory Visit: Payer: 59 | Admitting: Psychology

## 2017-05-04 ENCOUNTER — Ambulatory Visit (INDEPENDENT_AMBULATORY_CARE_PROVIDER_SITE_OTHER): Payer: Medicare Other | Admitting: Psychology

## 2017-05-04 DIAGNOSIS — F4323 Adjustment disorder with mixed anxiety and depressed mood: Secondary | ICD-10-CM | POA: Diagnosis not present

## 2017-05-25 ENCOUNTER — Encounter: Payer: Self-pay | Admitting: Gynecology

## 2017-05-25 ENCOUNTER — Ambulatory Visit: Payer: Medicare Other | Attending: Gynecology | Admitting: Gynecology

## 2017-05-25 ENCOUNTER — Other Ambulatory Visit (HOSPITAL_COMMUNITY)
Admission: RE | Admit: 2017-05-25 | Discharge: 2017-05-25 | Disposition: A | Discharge status: Home / Self Care | Payer: Medicare Other | Source: Ambulatory Visit | Attending: Gynecology | Admitting: Gynecology

## 2017-05-25 VITALS — BP 111/59 | HR 72 | Temp 98.4°F | Resp 18 | Ht 62.5 in | Wt 104.8 lb

## 2017-05-25 DIAGNOSIS — C541 Malignant neoplasm of endometrium: Secondary | ICD-10-CM | POA: Diagnosis present

## 2017-05-25 DIAGNOSIS — Z08 Encounter for follow-up examination after completed treatment for malignant neoplasm: Secondary | ICD-10-CM | POA: Insufficient documentation

## 2017-05-25 DIAGNOSIS — Z8542 Personal history of malignant neoplasm of other parts of uterus: Secondary | ICD-10-CM

## 2017-05-25 DIAGNOSIS — Z9221 Personal history of antineoplastic chemotherapy: Secondary | ICD-10-CM | POA: Diagnosis not present

## 2017-05-25 DIAGNOSIS — Z90710 Acquired absence of both cervix and uterus: Secondary | ICD-10-CM

## 2017-05-25 NOTE — Progress Notes (Signed)
Dictation #1 [id number]  [id number] Consult Note: Gyn-Onc   Veronica Ward 71 y.o. female  Chief Complaint  Patient presents with  . Endometrial cancer [facility])    Assessment : Endometrial carcinoma (papillary serous) stage I a (March 2015). Status post 6 cycles of carboplatin Taxol chemotherapy and vaginal vault brachytherapy. Patient is clinically free of disease.  Plan:  She will see Dr, Sondra Come in 9month. I plan on seeing the patient back in 12 months. Pap smears are obtained today she will continue to have annual mammograms.  The patient's reassured that her minimally invasive surgery for endometrial cancer is not the same as minimally invasive surgery for cervical cancer (radical hysterectomy). Further, randomized trials of showing similar survival outcomes for patients with endometrial cancer undergoing minimally invasive surgery.  Interval history: The patient returns today for continuing follow-up of endometrial carcinoma. Since her last visit with gynecologic oncology she's been seen by Dr. KSondra Come Today we she reports she's having no problems and her health is been good. She is up-to-date with mammograms.. She specifically denies any GI or GU symptoms no pelvic pain pressure vaginal bleeding or discharge. She has no neuropathy. She has a number questions about a recent [place] Times article reporting that minimally invasive surgery (radical hysterectomy) for cervical cancer was associated with worse outcomes.  HPI: 71year old white female initially seen in consultation request of Dr. PMora Bellmanregarding management of a newly diagnosed endometrial cancer. Approximately 3 weeks ago the patient developed some vaginal spotting. She was seen by Dr. cElly Modenaon February 25. An endometrial biopsy was obtained showing a grade 2 endometrial carcinoma. The patient is also had an ultrasound of the pelvis revealing a uterus measuring 7 x 3 x 3.7 cm. The endometrial stripe was 7  mm.  [name] She has no other gynecologic history.   Patient [facility] 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 Adjuvant therapy with carboplatin and Taxol and vaginal vault brachytherapy was given completed in August 2015.   Review of Systems:10 point review of systems is negative except as noted in interval history.   Vitals: Blood pressure (!) 111/59, pulse 72, temperature 98.4 F (36.9 C), temperature source Oral, resp. rate 18, height 5' 2.5" (1.588 m), weight 104 lb 12.8 oz (47.5 kg), last menstrual period 09/17/2000, SpO2 100 %.  Physical Exam: General : The patient is a healthy woman in no acute distress.  HEENT: normocephalic, extraoccular movements normal; neck is supple without thyromegally  Lynphnodes: Supraclavicular and inguinal nodes not enlarged  Abdomen: Soft, non-tender, no ascites, no organomegally, no masses, no hernias  Pelvic:  EGBUS: Normal female  Vagina: Normal, no lesions, atrophic   Urethra and Bladder: Normal, non-tender  Cervix: Surgically absent Uterus: Surgically absent Bi-manual examination: Non-tender; no adenxal masses or nodularity  Rectal: normal sphincter tone, no masses, no blood  Lower extremities: No edema or varicosities. Normal range of motion      Allergies  Allergen Reactions  . Bactrim [Sulfamethoxazole-Trimethoprim] Rash  . Benadryl [Diphenhydramine Hcl (Sleep)] Rash and Anxiety    Fever, body ache  . Oxycodone-Acetaminophen Anxiety  . Sulfa Antibiotics Rash    Past Medical History:  Diagnosis Date  . Anemia   . Anxiety   . Ashkenazi  Jewish ancestry   . Diffuse cystic mastopathy   . Disorder of bone and cartilage,  unspecified   . Dysautonomia ([facility])    mild  . Endometrial cancer ([place])   . Headache(784.0)   . History of shingles   . Hx of basal cell carcinoma    face  . Osteoporosis   . Other malaise and fatigue   . Other psoriasis   . Other seborrheic keratosis   . Pain in limb   . Predominant disturbance of emotions   . Radiation 01/06/14, 12/23/13, 12/16/13, 12/09/13   HDR brachytherapy  . Sleep disturbance, unspecified   . Unspecified hemorrhoids without mention of complication   . Urticaria, unspecified     Past Surgical History:  Procedure Laterality Date  . AXILLARY LYMPH NODE DISSECTION     left  . birthmark removal  childhood  . BREAST BIOPSY Left 2001   benign  . BREAST LUMPECTOMY     left underarm  . cervical cancer  1973  . CERVICAL CONIZATION W/BX    . COLONOSCOPY  12/04   int. hemorrhoids  . DEXA  03/2002   oseopenia  . DEXA  07/2004   decreased BMD, osteopenia  . DEXA  02/2007   Osteopenia  . DEXA  9/10   Osteopenia slightly worse  . felon I&D  12/2010   Dr. Fredna Dow, I&D in OR (L index finger)  . FINGER SURGERY    . KNEE SURGERY     right  . ROBOTIC ASSISTED TOTAL HYSTERECTOMY WITH BILATERAL SALPINGO OOPHERECTOMY  10/03/2013   Robotic-assisted total laparoscopic hysterectomy, bilateral salpingo-oophorectomy, bilateral pelvic and para-aortic lymphadenectomy with sentinel lymph node dissection  . TONSILECTOMY, ADENOIDECTOMY, BILATERAL MYRINGOTOMY AND TUBES    . TUBAL LIGATION      Current Outpatient Prescriptions  Medication Sig Dispense Refill  . alendronate (FOSAMAX) 70 MG tablet TAKE 1 TABLET BY MOUTH 30 MINUTES BEFOREBREAKFAST ONCE A WEEK WITH ATLEAST 8 OZ. OF WATER. 12 tablet 3  . Ascorbic Acid (VITAMIN C PO) Take 1 tablet by mouth daily.    Marland Kitchen CALCIUM PO Take 1,000 mg by mouth daily.    . cholecalciferol (VITAMIN D) 400 UNITS TABS Take 400 Units by mouth daily.     Marland Kitchen co-enzyme Q-10 30 MG capsule Take 30 mg by mouth daily.     . fish oil-omega-3 fatty acids 1000  MG capsule Take by mouth daily.     Marland Kitchen ibuprofen (ADVIL,MOTRIN) 200 MG tablet Take 200 mg by mouth as needed for pain. Reported on 12/15/2015    . L-Methylfolate 7.5 MG TABS Take 1 tablet (7.5 mg total) by mouth daily. 30 tablet 11  . Lactobacillus (CVS PROBIOTIC ACIDOPHILUS) 10 MG CAPS Take 1 capsule by mouth daily.     . magnesium 30 MG tablet Take 30 mg by mouth daily. Reported on 12/15/2015    . Multiple Vitamin (MULTIVITAMIN) tablet Take 1 tablet by mouth daily.    Marland Kitchen pyridOXINE (VITAMIN B-6) 100 MG tablet Take 100 mg by mouth daily.     No current facility-administered medications for this visit.     Social History   Social History  . Marital status: Married    Spouse name: N/A  . Number of children: 3  . Years of education: N/A   Occupational History  . Professor    Social History Main Topics  . Smoking status: Former Smoker    Quit date: 07/25/1963  . Smokeless tobacco: Never Used  . Alcohol use 0.6 oz/week  1 Glasses of wine per week     Comment: 1/2 glass of wine a day  . Drug use: No  . Sexual activity: Yes    Birth control/ protection: Post-menopausal   Other Topics Concern  . Not on file   Social History Narrative   History professor      Likes to dance      Is a walker and does floor exercises      Caffeine: 2 cups daily    Family History  Problem Relation Age of Onset  . Multiple myeloma Father   . Coronary artery disease Father   . Transient ischemic attack Mother   . Multiple sclerosis Brother   . Lupus Maternal Aunt   . Breast cancer Maternal Aunt        dx 74s; deceased  . Cancer Maternal Grandfather        GI cancer or pancreatic; deceased 82  . Ovarian cancer Maternal Aunt        dx 1s; deceased 36s  . Colon cancer Neg Hx       Marti Sleigh, MD 05/25/2017, 11:28 AM         Consult Note: Gyn-Onc

## 2017-05-25 NOTE — Patient Instructions (Signed)
We will call you with your pap smear results. Call in July of 2019 to schedule an appointment with Dr. Fermin Schwab for November.

## 2017-05-30 LAB — CYTOLOGY - PAP
Diagnosis: NEGATIVE
Diagnosis: REACTIVE

## 2017-05-31 ENCOUNTER — Ambulatory Visit: Payer: Medicare Other | Admitting: Psychology

## 2017-05-31 DIAGNOSIS — F4323 Adjustment disorder with mixed anxiety and depressed mood: Secondary | ICD-10-CM | POA: Diagnosis not present

## 2017-06-18 ENCOUNTER — Ambulatory Visit: Payer: Medicare Other | Admitting: Psychology

## 2017-06-18 DIAGNOSIS — F4323 Adjustment disorder with mixed anxiety and depressed mood: Secondary | ICD-10-CM

## 2017-06-27 ENCOUNTER — Other Ambulatory Visit: Payer: Self-pay | Admitting: Family Medicine

## 2017-06-27 DIAGNOSIS — Z1231 Encounter for screening mammogram for malignant neoplasm of breast: Secondary | ICD-10-CM

## 2017-07-13 ENCOUNTER — Ambulatory Visit (INDEPENDENT_AMBULATORY_CARE_PROVIDER_SITE_OTHER): Payer: Medicare Other | Admitting: Psychology

## 2017-07-13 DIAGNOSIS — F4323 Adjustment disorder with mixed anxiety and depressed mood: Secondary | ICD-10-CM

## 2017-07-31 ENCOUNTER — Ambulatory Visit
Admission: RE | Admit: 2017-07-31 | Discharge: 2017-07-31 | Disposition: A | Discharge status: Home / Self Care | Payer: Medicare Other | Source: Ambulatory Visit | Attending: Family Medicine | Admitting: Family Medicine

## 2017-07-31 DIAGNOSIS — Z1231 Encounter for screening mammogram for malignant neoplasm of breast: Secondary | ICD-10-CM

## 2017-08-14 ENCOUNTER — Ambulatory Visit: Payer: Medicare Other | Admitting: Psychology

## 2017-08-14 DIAGNOSIS — F4323 Adjustment disorder with mixed anxiety and depressed mood: Secondary | ICD-10-CM | POA: Diagnosis not present

## 2017-08-21 ENCOUNTER — Ambulatory Visit (INDEPENDENT_AMBULATORY_CARE_PROVIDER_SITE_OTHER): Payer: Medicare Other | Admitting: Psychology

## 2017-08-21 DIAGNOSIS — F4323 Adjustment disorder with mixed anxiety and depressed mood: Secondary | ICD-10-CM

## 2017-10-09 ENCOUNTER — Ambulatory Visit: Payer: Medicare Other | Admitting: Psychology

## 2017-10-09 DIAGNOSIS — F4323 Adjustment disorder with mixed anxiety and depressed mood: Secondary | ICD-10-CM

## 2017-10-17 ENCOUNTER — Telehealth: Payer: Self-pay | Admitting: Family Medicine

## 2017-10-17 MED ORDER — ALENDRONATE SODIUM 70 MG PO TABS: ORAL_TABLET | ORAL | 1 refills | Status: DC

## 2017-10-17 NOTE — Telephone Encounter (Signed)
I do not see where we have received anything regarding fosamax request.  Will forward to [place], CMA to evaluate further and communicate with patient.  Patient has upcoming AWV appts scheduled around the first couple of weeks in April.   Thanks.

## 2017-10-17 NOTE — Telephone Encounter (Signed)
It is due for a refill but I haven't received a refill request, will fill now but only for a few months because she will hit the 58yr mark in Oct.

## 2017-10-17 NOTE — Telephone Encounter (Signed)
Copied from [place] [id number]. Topic: Quick Communication - Rx Refill/Question >> Oct 17, 2017  4:00 PM Margot Ables wrote: Medication: fosamax - pt called to f/u on why fosamax was denied - pt is due for next dose Sunday 3/31 (I do not see notes in system) Has the patient contacted their pharmacy? Yes - pharmacy told pt refill was denied (Agent: If no, request that the patient contact the pharmacy for the refill.) Preferred Pharmacy (with phone number or street name): [id number] [place], [place] [phone] (Phone) [phone] (Fax)

## 2017-10-26 ENCOUNTER — Ambulatory Visit: Payer: Self-pay

## 2017-10-29 ENCOUNTER — Ambulatory Visit (INDEPENDENT_AMBULATORY_CARE_PROVIDER_SITE_OTHER): Payer: Medicare Other

## 2017-10-29 VITALS — BP 112/80 | HR 73 | Temp 98.5°F | Ht 62.5 in | Wt 103.8 lb

## 2017-10-29 DIAGNOSIS — R7989 Other specified abnormal findings of blood chemistry: Secondary | ICD-10-CM

## 2017-10-29 DIAGNOSIS — Z0000 Encounter for general adult medical examination without abnormal findings: Secondary | ICD-10-CM | POA: Diagnosis not present

## 2017-10-29 DIAGNOSIS — R718 Other abnormality of red blood cells: Secondary | ICD-10-CM

## 2017-10-29 DIAGNOSIS — E7800 Pure hypercholesterolemia, unspecified: Secondary | ICD-10-CM

## 2017-10-29 DIAGNOSIS — M810 Age-related osteoporosis without current pathological fracture: Secondary | ICD-10-CM

## 2017-10-29 LAB — CBC WITH DIFFERENTIAL/PLATELET
Basophils Absolute: 0 10*3/uL (ref 0.0–0.1)
Basophils Relative: 0.6 % (ref 0.0–3.0)
Eosinophils Absolute: 0.1 10*3/uL (ref 0.0–0.7)
Eosinophils Relative: 2.4 % (ref 0.0–5.0)
HCT: 37.8 % (ref 36.0–46.0)
Hemoglobin: 12.8 g/dL (ref 12.0–15.0)
Lymphocytes Relative: 32.2 % (ref 12.0–46.0)
Lymphs Abs: 1.5 10*3/uL (ref 0.7–4.0)
MCHC: 33.8 g/dL (ref 30.0–36.0)
MCV: 98.8 fl (ref 78.0–100.0)
Monocytes Absolute: 0.5 10*3/uL (ref 0.1–1.0)
Monocytes Relative: 10.8 % (ref 3.0–12.0)
Neutro Abs: 2.4 10*3/uL (ref 1.4–7.7)
Neutrophils Relative %: 54 % (ref 43.0–77.0)
Platelets: 167 10*3/uL (ref 150.0–400.0)
RBC: 3.83 Mil/uL — ABNORMAL LOW (ref 3.87–5.11)
RDW: 13.1 % (ref 11.5–15.5)
WBC: 4.5 10*3/uL (ref 4.0–10.5)

## 2017-10-29 LAB — LIPID PANEL
Cholesterol: 188 mg/dL (ref 0–200)
HDL: 77 mg/dL (ref >39.00)
LDL Cholesterol: 93 mg/dL (ref 0–99)
NonHDL: 110.96
Total CHOL/HDL Ratio: 2
Triglycerides: 88 mg/dL (ref 0.0–149.0)
VLDL: 17.6 mg/dL (ref 0.0–40.0)

## 2017-10-29 LAB — COMPREHENSIVE METABOLIC PANEL
ALT: 15 U/L (ref 0–35)
AST: 22 U/L (ref 0–37)
Albumin: 4.1 g/dL (ref 3.5–5.2)
Alkaline Phosphatase: 43 U/L (ref 39–117)
BUN: 13 mg/dL (ref 6–23)
CO2: 32 mEq/L (ref 19–32)
Calcium: 9.8 mg/dL (ref 8.4–10.5)
Chloride: 100 mEq/L (ref 96–112)
Creatinine, Ser: 0.63 mg/dL (ref 0.40–1.20)
GFR: 98.25 mL/min (ref >60.00)
Glucose, Bld: 87 mg/dL (ref 70–99)
Potassium: 3.9 mEq/L (ref 3.5–5.1)
Sodium: 137 mEq/L (ref 135–145)
Total Bilirubin: 0.3 mg/dL (ref 0.2–1.2)
Total Protein: 7.2 g/dL (ref 6.0–8.3)

## 2017-10-29 LAB — VITAMIN B12: Vitamin B-12: 776 pg/mL (ref 211–911)

## 2017-10-29 LAB — TSH: TSH: 2.66 u[IU]/mL (ref 0.35–4.50)

## 2017-10-29 LAB — VITAMIN D 25 HYDROXY (VIT D DEFICIENCY, FRACTURES): VITD: 47.4 ng/mL (ref 30.00–100.00)

## 2017-10-29 NOTE — Patient Instructions (Addendum)
Veronica Ward , Thank you for taking time to come for your Medicare Wellness Visit. I appreciate your ongoing commitment to your health goals. Please review the following plan we discussed and let me know if I can assist you in the future.   These are the goals we discussed: Goals    When weather permits, I will continue to walk for 30 minutes or to dance for 2.5 hours.       This is a list of the screening recommended for you and due dates:  Health Maintenance  Topic Date Due  . Flu Shot  02/21/2018  . Mammogram  07/31/2018  . Tetanus Vaccine  12/28/2019  . Colon Cancer Screening  07/04/2023  . DEXA scan (bone density measurement)  Completed  .  Hepatitis C: One time screening is recommended by Center for Disease Control  (CDC) for  adults born from [birth year] through 1967.   Completed  . Pneumonia vaccines  Completed   Preventive Care for Adults  A healthy lifestyle and preventive care can promote health and wellness. Preventive health guidelines for adults include the following key practices.  . A routine yearly physical is a good way to check with your health care provider about your health and preventive screening. It is a chance to share any concerns and updates on your health and to receive a thorough exam.  . Visit your dentist for a routine exam and preventive care every 6 months. Brush your teeth twice a day and floss once a day. Good oral hygiene prevents tooth decay and gum disease.  . The frequency of eye exams is based on your age, health, family medical history, use  of contact lenses, and other factors. Follow your health care provider's recommendations for frequency of eye exams.  . Eat a healthy diet. Foods like vegetables, fruits, whole grains, low-fat dairy products, and lean protein foods contain the nutrients you need without too many calories. Decrease your intake of foods high in solid fats, added sugars, and salt. Eat the right amount of calories for you. Get information  about a proper diet from your health care provider, if necessary.  . Regular physical exercise is one of the most important things you can do for your health. Most adults should get at least 150 minutes of moderate-intensity exercise (any activity that increases your heart rate and causes you to sweat) each week. In addition, most adults need muscle-strengthening exercises on 2 or more days a week.  Silver Sneakers may be a benefit available to you. To determine eligibility, you may visit the website: www.silversneakers.com or contact program at (949)309-3892 Mon-Fri between 8AM-8PM.   . Maintain a healthy weight. The body mass index (BMI) is a screening tool to identify possible weight problems. It provides an estimate of body fat based on height and weight. Your health care provider can find your BMI and can help you achieve or maintain a healthy weight.   For adults 20 years and older: ? A BMI below 18.5 is considered underweight. ? A BMI of 18.5 to 24.9 is normal. ? A BMI of 25 to 29.9 is considered overweight. ? A BMI of 30 and above is considered obese.   . Maintain normal blood lipids and cholesterol levels by exercising and minimizing your intake of saturated fat. Eat a balanced diet with plenty of fruit and vegetables. Blood tests for lipids and cholesterol should begin at age 59 and be repeated every 5 years. If your lipid or cholesterol levels  are high, you are over 50, or you are at high risk for heart disease, you may need your cholesterol levels checked more frequently. Ongoing high lipid and cholesterol levels should be treated with medicines if diet and exercise are not working.  . If you smoke, find out from your health care provider how to quit. If you do not use tobacco, please do not start.  . If you choose to drink alcohol, please do not consume more than 2 drinks per day. One drink is considered to be 12 ounces (355 mL) of beer, 5 ounces (148 mL) of wine, or 1.5 ounces (44  mL) of liquor.  . If you are 81-68 years old, ask your health care provider if you should take aspirin to prevent strokes.  . Use sunscreen. Apply sunscreen liberally and repeatedly throughout the day. You should seek shade when your shadow is shorter than you. Protect yourself by wearing long sleeves, pants, a wide-brimmed hat, and sunglasses year round, whenever you are outdoors.  . Once a month, do a whole body skin exam, using a mirror to look at the skin on your back. Tell your health care provider of new moles, moles that have irregular borders, moles that are larger than a pencil eraser, or moles that have changed in shape or color.

## 2017-10-29 NOTE — Progress Notes (Signed)
Subjective:   Veronica Ward is a 72 y.o. female who presents for Medicare Annual (Subsequent) preventive examination.  Review of Systems:  N/A Cardiac Risk Factors include: advanced age (>33mn, >>72women)     Objective:     Vitals: BP 112/80 (BP Location: Right Arm, Patient Position: Sitting, Cuff Size: Normal)   Pulse 73   Temp 98.5 F (36.9 C) (Oral)   Ht 5' 2.5" (1.588 m) Comment: no shoes  Wt 103 lb 12 oz (47.1 kg)   LMP 09/17/2000   SpO2 98%   BMI 18.67 kg/m   Body mass index is 18.67 kg/m.  Advanced Directives 10/29/2017 05/25/2017 03/08/2017 12/11/2016 10/25/2016 09/11/2016 06/01/2016  Does Patient Have a Medical Advance Directive? Yes Yes Yes Yes No;Yes Yes Yes  Type of AParamedicof [place]Living will HGu [place]Living will HMorgan's PointLiving will HWinter GardensLiving will [place]sm[place]Living will H[place]Living will -  Copy of HM[place] Chart? No - copy requested No - copy requested No - copy requested No - copy requested No - copy requested No - copy requested Yes  Would patient like information on creating a medical advance directive? - - - - - - -    Tobacco Social History   Tobacco Use  Smoking Status Former Smoker  . Last attempt to quit: 07/25/1963  . Years since quitting: 54.3  Smokeless Tobacco Never Used     Counseling given: No   Clinical Intake:  Pre-visit preparation completed: Yes  Pain : No/denies pain Pain Score: 0-No pain     Nutritional Status: BMI of 19-24  Normal Nutritional Risks: None Diabetes: No  How often do you need to have someone help you when you read instructions, pamphlets, or other written materials from your doctor or pharmacy?: 1 - Never What is the last grade level you completed in school?: Doctorate degree  Interpreter Needed?: No  Comments: pt lives with spouse Information entered by  :: LPinson, LPN  Past Medical History:  Diagnosis Date  . Anemia   . Anxiety   . Ashkenazi Jewish ancestry   . Diffuse cystic mastopathy   . Disorder of bone and cartilage, unspecified   . Dysautonomia (HCC)    mild  . Endometrial cancer ([place]   . Headache(784.0)   . History of shingles   . Hx of basal cell carcinoma    face  . Osteoporosis   . Other malaise and fatigue   . Other psoriasis   . Other seborrheic keratosis   . Pain in limb   . Predominant disturbance of emotions   . Radiation 01/06/14, 12/23/13, 12/16/13, 12/09/13   HDR brachytherapy  . Sleep disturbance, unspecified   . Unspecified hemorrhoids without mention of complication   . Urticaria, unspecified    Past Surgical History:  Procedure Laterality Date  . AXILLARY LYMPH NODE DISSECTION     left  . birthmark removal  childhood  . BREAST BIOPSY Left 2001   benign  . BREAST EXCISIONAL BIOPSY Left    benign  . BREAST LUMPECTOMY     left underarm  . cervical cancer  1973  . CERVICAL CONIZATION W/BX    . COLONOSCOPY  12/04   int. hemorrhoids  . DEXA  03/2002   oseopenia  . DEXA  07/2004   decreased BMD, osteopenia  . DEXA  02/2007   Osteopenia  . DEXA  9/10   Osteopenia  slightly worse  . felon I&D  12/2010   Dr. Fredna Dow, I&D in OR (L index finger)  . FINGER SURGERY    . KNEE SURGERY     right  . ROBOTIC ASSISTED TOTAL HYSTERECTOMY WITH BILATERAL SALPINGO OOPHERECTOMY  10/03/2013   Robotic-assisted total laparoscopic hysterectomy, bilateral salpingo-oophorectomy, bilateral pelvic and para-aortic lymphadenectomy with sentinel lymph node dissection  . TONSILECTOMY, ADENOIDECTOMY, BILATERAL MYRINGOTOMY AND TUBES    . TUBAL LIGATION     Family History  Problem Relation Age of Onset  . Multiple myeloma Father   . Coronary artery disease Father   . Transient ischemic attack Mother   . Multiple sclerosis Brother   . Lupus Maternal Aunt   . Breast cancer Maternal Aunt        dx 8s; deceased  . Cancer  Maternal Grandfather        GI cancer or pancreatic; deceased 32  . Ovarian cancer Maternal Aunt        dx 36s; deceased 52s  . Colon cancer Neg Hx    Social History   Socioeconomic History  . Marital status: Married    Spouse name: Not on file  . Number of children: 3  . Years of education: Not on file  . Highest education level: Not on file  Occupational History  . Occupation: Professor  Social Needs  . Financial resource strain: Not on file  . Food insecurity:    Worry: Not on file    Inability: Not on file  . Transportation needs:    Medical: Not on file    Non-medical: Not on file  Tobacco Use  . Smoking status: Former Smoker    Last attempt to quit: 07/25/1963    Years since quitting: 54.3  . Smokeless tobacco: Never Used  Substance and Sexual Activity  . Alcohol use: Yes    Alcohol/week: 0.6 oz    Types: 1 Glasses of wine per week    Comment: 1/2 glass of wine a day  . Drug use: No  . Sexual activity: Yes    Birth control/protection: Post-menopausal  Lifestyle  . Physical activity:    Days per week: Not on file    Minutes per session: Not on file  . Stress: Not on file  Relationships  . Social connections:    Talks on phone: Not on file    Gets together: Not on file    Attends religious service: Not on file    Active member of club or organization: Not on file    Attends meetings of clubs or organizations: Not on file    Relationship status: Not on file  Other Topics Concern  . Not on file  Social History Narrative   History professor      Likes to dance      Is a walker and does floor exercises      Caffeine: 2 cups daily    Outpatient Encounter Medications as of 10/29/2017  Medication Sig  . alendronate (FOSAMAX) 70 MG tablet TAKE 1 TABLET BY MOUTH 30 MINUTES BEFOREBREAKFAST ONCE A WEEK WITH ATLEAST 8 OZ. OF WATER.  . Ascorbic Acid (VITAMIN C PO) Take 1 tablet by mouth daily.  Marland Kitchen CALCIUM PO Take 1,000 mg by mouth daily.  . cholecalciferol  (VITAMIN D) 400 UNITS TABS Take 400 Units by mouth daily.   Marland Kitchen co-enzyme Q-10 30 MG capsule Take 30 mg by mouth daily.   . fish oil-omega-3 fatty acids 1000 MG capsule Take by mouth  daily.   . ibuprofen (ADVIL,MOTRIN) 200 MG tablet Take 200 mg by mouth as needed for pain. Reported on 12/15/2015  . L-Methylfolate 7.5 MG TABS Take 1 tablet (7.5 mg total) by mouth daily.  . Lactobacillus ([employer]S PROBIOTIC ACIDOPHILUS) 10 MG CAPS Take 1 capsule by mouth daily.   . magnesium 30 MG tablet Take 30 mg by mouth daily. Reported on 12/15/2015  . Multiple Vitamin (MULTIVITAMIN) tablet Take 1 tablet by mouth daily.  . [DISCONTINUED] pyridOXINE (VITAMIN B-6) 100 MG tablet Take 100 mg by mouth daily.   No facility-administered encounter medications on file as of 10/29/2017.     Activities of Daily Living In your present state of health, do you have any difficulty performing the following activities: 10/29/2017  Hearing? N  Vision? N  Difficulty concentrating or making decisions? N  Walking or climbing stairs? N  Dressing or bathing? N  Doing errands, shopping? N  Preparing Food and eating ? N  Using the Toilet? N  In the past six months, have you accidently leaked urine? N  Do you have problems with loss of bowel control? N  Managing your Medications? N  Managing your Finances? N  Housekeeping or managing your Housekeeping? N  Some recent data might be hidden    Patient Care Team: Tower, Wynelle Fanny, MD as PCP - General    Assessment:   This is a routine wellness examination for Veronica Ward.  Exercise Activities and Dietary recommendations Current Exercise Habits: Home exercise routine;Structured exercise class, Type of exercise: Other - see comments;calisthenics;walking(dancing 2.5 hrs weekly; walking 30 min weather permitting; floor exercises 10 min daily), Exercise limited by: None identified  Goals    . Increase physical activity     When weather permits, I will continue to walk for 30 minutes or to  dance for 2.5 hours.        Fall Risk Fall Risk  10/29/2017 12/11/2016 10/25/2016 06/01/2016 09/20/2015  Falls in the past year? No No No Yes No  Number falls in past yr: - - - 1 -  Injury with Fall? - - - No -   Depression Screen PHQ 2/9 Scores 10/29/2017 12/11/2016 10/25/2016 06/01/2016  PHQ - 2 Score 0 1 0 0  PHQ- 9 Score 0 - - -     Cognitive Function MMSE - Mini Mental State Exam 10/29/2017 10/25/2016  Orientation to time 5 5  Orientation to Place 5 5  Registration 3 3  Attention/ Calculation 0 0  Recall 3 3  Language- name 2 objects 0 0  Language- repeat 1 1  Language- follow 3 step command 3 3  Language- read & follow direction 0 0  Write a sentence 0 0  Copy design 0 0  Total score 20 20     PLEASE NOTE: A Mini-Cog screen was completed. Maximum score is 20. A value of 0 denotes this part of [place]stein MMSE was not completed or the patient failed this part of the Mini-Cog screening.   Mini-Cog Screening Orientation to Time - Max 5 pts Orientation to Place - Max 5 pts Registration - Max 3 pts Recall - Max 3 pts Language Repeat - Max 1 pts Language Follow 3 Step Command - Max 3 pts     Immunization History  Administered Date(s) Administered  . Hepatitis A, Adult 04/23/2013  . Influenza Split 04/05/2011, 05/06/2012  . Influenza Whole 05/03/2007, 05/26/2009, 04/28/2010  . Influenza,inj,Quad PF,6+ Mos 04/23/2013, 04/21/2014, 04/09/2015, 05/01/2016, 05/01/2017  . Pneumococcal Conjugate-13 09/18/2014  .  Pneumococcal Polysaccharide-23 04/05/2011  . Td 03/04/2002  . Zoster 03/20/2013    Screening Tests Health Maintenance  Topic Date Due  . INFLUENZA VACCINE  02/21/2018  . MAMMOGRAM  07/31/2018  . TETANUS/TDAP  12/28/2019  . COLONOSCOPY  07/04/2023  . DEXA SCAN  Completed  . Hepatitis C Screening  Completed  . PNA vac Low Risk Adult  Completed       Plan:     I have personally reviewed, addressed, and noted the following in the patient's chart:  A. Medical and social  history B. Use of alcohol, tobacco or illicit drugs  C. Current medications and supplements D. Functional ability and status E.  Nutritional status F.  Physical activity G. Advance directives H. List of other physicians I.  Hospitalizations, surgeries, and ER visits in previous 12 months J.  [place] to include hearing, vision, cognitive, depression L. Referrals and appointments - none  In addition, I have reviewed and discussed with patient certain preventive protocols, quality metrics, and best practice recommendations. A written personalized care plan for preventive services as well as general preventive health recommendations were provided to patient.  See attached scanned questionnaire for additional information.   Signed,   Lindell Noe, MHA, BS, LPN Health Coach

## 2017-10-29 NOTE — Progress Notes (Signed)
PCP notes:   Health maintenance:  No gaps identified.  Abnormal screenings:   None  Patient concerns:   None  Nurse concerns:  None  Next PCP appt:   11/02/17 @ 1130  I reviewed health advisor's note, was available for consultation, and agree with documentation and plan. [name] MD

## 2017-11-01 ENCOUNTER — Ambulatory Visit: Payer: Medicare Other | Admitting: Psychology

## 2017-11-01 DIAGNOSIS — F4323 Adjustment disorder with mixed anxiety and depressed mood: Secondary | ICD-10-CM | POA: Diagnosis not present

## 2017-11-02 ENCOUNTER — Ambulatory Visit (INDEPENDENT_AMBULATORY_CARE_PROVIDER_SITE_OTHER): Payer: Medicare Other | Admitting: Family Medicine

## 2017-11-02 ENCOUNTER — Encounter: Payer: Self-pay | Admitting: Family Medicine

## 2017-11-02 VITALS — BP 122/60 | HR 67 | Temp 98.5°F | Ht 62.5 in | Wt 103.8 lb

## 2017-11-02 DIAGNOSIS — M810 Age-related osteoporosis without current pathological fracture: Secondary | ICD-10-CM

## 2017-11-02 DIAGNOSIS — E7219 Other disorders of sulfur-bearing amino-acid metabolism: Secondary | ICD-10-CM

## 2017-11-02 DIAGNOSIS — C541 Malignant neoplasm of endometrium: Secondary | ICD-10-CM

## 2017-11-02 DIAGNOSIS — Z0000 Encounter for general adult medical examination without abnormal findings: Secondary | ICD-10-CM

## 2017-11-02 DIAGNOSIS — R7983 Abnormal findings of blood amino-acid level: Secondary | ICD-10-CM

## 2017-11-02 MED ORDER — L-METHYLFOLATE 7.5 MG PO TABS: 7.5000 mg | ORAL_TABLET | Freq: Every day | ORAL | 11 refills | Status: AC

## 2017-11-02 MED ORDER — ALENDRONATE SODIUM 70 MG PO TABS: ORAL_TABLET | ORAL | 1 refills | Status: DC

## 2017-11-02 NOTE — Assessment & Plan Note (Signed)
Doing well with follow up

## 2017-11-02 NOTE — Progress Notes (Signed)
Subjective:    Patient ID: Veronica Ward, female    DOB: 1946-03-21, 72 y.o.   MRN: [id number]  HPI  Here for health maintenance exam and to review chronic medical problems    Feeling pretty well overall  Not slowing down  Still dancing   Working too much  Takes a little time off   Her mother is actively dying (105 years old)  She is basically comatose Will go to [place] to check in    [place] Readings from Last 3 Encounters:  11/02/17 103 lb 12 oz (47.1 kg)  10/29/17 103 lb 12 oz (47.1 kg)  05/25/17 104 lb 12.8 oz (47.5 kg)  she is eating her usual amount-healthy food  Does eat some meat and some dairy - and mostly plant based  Lots of nuts and avacados  18.67 kg/m   amw 4/19 No concerns   Mammogram 1/19-normal  Self breast exam -no lumps or changes   Hx of endomet cancer with hysterectomy and tx  Sees gyn for paps/exams  Last visit was November   Colonoscopy 12/14-normal   dexa 2/17-osteopenia mixed trend  Takes fosamax (10/19 will be 5 years) Hx of past foot fracture  No few falls or fractures  Still walks and does floor exercises  And dances a lot  D level is therapeutic   zostavax 8/14  May be interested in shingrix   Labs  Results for orders placed or performed in visit on 10/29/17  TSH  Result Value Ref Range   TSH 2.66 0.35 - 4.50 uIU/mL  Lipid Panel  Result Value Ref Range   Cholesterol 188 0 - 200 mg/dL   Triglycerides 88.0 0.0 - 149.0 mg/dL   HDL 77.00 >39.00 mg/dL   VLDL 17.6 0.0 - 40.0 mg/dL   LDL Cholesterol 93 0 - 99 mg/dL   Total CHOL/HDL Ratio 2    NonHDL 110.96   Comprehensive metabolic panel  Result Value Ref Range   Sodium 137 135 - 145 mEq/L   Potassium 3.9 3.5 - 5.1 mEq/L   Chloride 100 96 - 112 mEq/L   CO2 32 19 - 32 mEq/L   Glucose, Bld 87 70 - 99 mg/dL   BUN 13 6 - 23 mg/dL   Creatinine, Ser 0.63 0.40 - 1.20 mg/dL   Total Bilirubin 0.3 0.2 - 1.2 mg/dL   Alkaline Phosphatase 43 39 - 117 U/L   AST 22 0 - 37 U/L   ALT 15 0  - 35 U/L   Total Protein 7.2 6.0 - 8.3 g/dL   Albumin 4.1 3.5 - 5.2 g/dL   Calcium 9.8 8.4 - 10.5 mg/dL   GFR 98.25 >60.00 mL/min  CBC with Differential/Platelet  Result Value Ref Range   WBC 4.5 4.0 - 10.5 K/uL   RBC 3.83 (L) 3.87 - 5.11 Mil/uL   Hemoglobin 12.8 12.0 - 15.0 g/dL   HCT 37.8 36.0 - 46.0 %   MCV 98.8 78.0 - 100.0 fl   MCHC 33.8 30.0 - 36.0 g/dL   RDW 13.1 11.5 - 15.5 %   Platelets 167.0 150.0 - 400.0 K/uL   Neutrophils Relative % 54.0 43.0 - 77.0 %   Lymphocytes Relative 32.2 12.0 - 46.0 %   Monocytes Relative 10.8 3.0 - 12.0 %   Eosinophils Relative 2.4 0.0 - 5.0 %   Basophils Relative 0.6 0.0 - 3.0 %   Neutro Abs 2.4 1.4 - 7.7 K/uL   Lymphs Abs 1.5 0.7 - 4.0 K/uL  Monocytes Absolute 0.5 0.1 - 1.0 K/uL   Eosinophils Absolute 0.1 0.0 - 0.7 K/uL   Basophils Absolute 0.0 0.0 - 0.1 K/uL  Vitamin B12  Result Value Ref Range   Vitamin B-12 776 211 - 911 pg/mL  Vitamin D, 25-hydroxy  Result Value Ref Range   VITD 47.40 30.00 - 100.00 ng/mL    Very good labs overall  Takes ca and D  Takes her folate for homocysteinemia   Tumeric and ginger -help aches and pains she thinks    Patient Active Problem List   Diagnosis Date Noted  . Foot fracture 07/10/2016  . Homocysteinemia ([place]) 05/09/2016  . Estrogen deficiency 07/21/2015  . Ashkenazi Jewish ancestry   . Endometrial cancer ([place]) 11/03/2013  . History of uterine cancer 10/28/2013  . CIS (carcinoma in situ of cervix) 09/23/2013  . Encounter for Medicare annual wellness exam 03/12/2013  . Colon cancer screening 03/12/2013  . Routine general medical examination at a health care facility 03/03/2013  . Encounter for medication monitoring 11/23/2010  . SEBORRHEIC KERATOSIS 08/25/2009  . SLEEP DISORDER 12/09/2008  . HEMORRHOIDS 10/12/2008  . Osteoporosis 10/08/2007  . BASAL CELL CARCINOMA, FACE 06/21/2007  . FIBROCYSTIC BREAST DISEASE 06/21/2007  . PSORIASIS 06/21/2007  . URTICARIA 06/21/2007  . BASAL CELL  CARCINOMA, FACE 06/21/2007   Past Medical History:  Diagnosis Date  . Anemia   . Anxiety   . Ashkenazi Jewish ancestry   . Diffuse cystic mastopathy   . Disorder of bone and cartilage, unspecified   . Dysautonomia (HCC)    mild  . Endometrial cancer ([place])   . Headache(784.0)   . History of shingles   . Hx of basal cell carcinoma    face  . Osteoporosis   . Other malaise and fatigue   . Other psoriasis   . Other seborrheic keratosis   . Pain in limb   . Predominant disturbance of emotions   . Radiation 01/06/14, 12/23/13, 12/16/13, 12/09/13   HDR brachytherapy  . Sleep disturbance, unspecified   . Unspecified hemorrhoids without mention of complication   . Urticaria, unspecified    Past Surgical History:  Procedure Laterality Date  . AXILLARY LYMPH NODE DISSECTION     left  . birthmark removal  childhood  . BREAST BIOPSY Left 2001   benign  . BREAST EXCISIONAL BIOPSY Left    benign  . BREAST LUMPECTOMY     left underarm  . cervical cancer  1973  . CERVICAL CONIZATION W/BX    . COLONOSCOPY  12/04   int. hemorrhoids  . DEXA  03/2002   oseopenia  . DEXA  07/2004   decreased BMD, osteopenia  . DEXA  02/2007   Osteopenia  . DEXA  9/10   Osteopenia slightly worse  . felon I&D  12/2010   Dr. Fredna Dow, I&D in OR (L index finger)  . FINGER SURGERY    . KNEE SURGERY     right  . ROBOTIC ASSISTED TOTAL HYSTERECTOMY WITH BILATERAL SALPINGO OOPHERECTOMY  10/03/2013   Robotic-assisted total laparoscopic hysterectomy, bilateral salpingo-oophorectomy, bilateral pelvic and para-aortic lymphadenectomy with sentinel lymph node dissection  . TONSILECTOMY, ADENOIDECTOMY, BILATERAL MYRINGOTOMY AND TUBES    . TUBAL LIGATION     Social History   Tobacco Use  . Smoking status: Former Smoker    Last attempt to quit: 07/25/1963    Years since quitting: 54.3  . Smokeless tobacco: Never Used  Substance Use Topics  . Alcohol use: Yes    Alcohol/week:  0.6 oz    Types: 1 Glasses of wine per  week    Comment: 1/2 glass of wine a day  . Drug use: No   Family History  Problem Relation Age of Onset  . Multiple myeloma Father   . Coronary artery disease Father   . Transient ischemic attack Mother   . Multiple sclerosis Brother   . Lupus Maternal Aunt   . Breast cancer Maternal Aunt        dx 54s; deceased  . Cancer Maternal Grandfather        GI cancer or pancreatic; deceased 34  . Ovarian cancer Maternal Aunt        dx 24s; deceased 59s  . Colon cancer Neg Hx    Allergies  Allergen Reactions  . Bactrim [Sulfamethoxazole-Trimethoprim] Rash  . Benadryl [Diphenhydramine Hcl (Sleep)] Rash and Anxiety    Fever, body ache  . Oxycodone-Acetaminophen Anxiety  . Sulfa Antibiotics Rash   Current Outpatient Medications on File Prior to Visit  Medication Sig Dispense Refill  . Ascorbic Acid (VITAMIN C PO) Take 1 tablet by mouth daily.    Marland Kitchen CALCIUM PO Take 1,000 mg by mouth 2 (two) times daily.     . cholecalciferol (VITAMIN D) 400 UNITS TABS Take 400 Units by mouth daily.     Marland Kitchen co-enzyme Q-10 30 MG capsule Take 30 mg by mouth daily.     . fish oil-omega-3 fatty acids 1000 MG capsule Take by mouth daily.     Marland Kitchen ibuprofen (ADVIL,MOTRIN) 200 MG tablet Take 200 mg by mouth as needed for pain. Reported on 12/15/2015    . Lactobacillus (CVS PROBIOTIC ACIDOPHILUS) 10 MG CAPS Take 1 capsule by mouth daily.     . magnesium 30 MG tablet Take 30 mg by mouth daily. Reported on 12/15/2015    . Multiple Vitamin (MULTIVITAMIN) tablet Take 1 tablet by mouth daily.     No current facility-administered medications on file prior to visit.     Review of Systems  Constitutional: Negative for activity change, appetite change, fatigue, fever and unexpected weight change.  HENT: Negative for congestion, ear pain, rhinorrhea, sinus pressure and sore throat.   Eyes: Negative for pain, redness and visual disturbance.  Respiratory: Negative for cough, shortness of breath and wheezing.   Cardiovascular:  Negative for chest pain and palpitations.  Gastrointestinal: Negative for abdominal pain, blood in stool, constipation and diarrhea.  Endocrine: Negative for polydipsia and polyuria.  Genitourinary: Negative for dysuria, frequency and urgency.  Musculoskeletal: Negative for arthralgias, back pain and myalgias.  Skin: Negative for pallor and rash.  Allergic/Immunologic: Negative for environmental allergies.  Neurological: Negative for dizziness, syncope and headaches.  Hematological: Negative for adenopathy. Does not bruise/bleed easily.  Psychiatric/Behavioral: Negative for decreased concentration and dysphoric mood. The patient is not nervous/anxious.        Objective:   Physical Exam  Constitutional: She appears well-developed and well-nourished. No distress.  Underweight and well appearing   HENT:  Head: Normocephalic and atraumatic.  Right Ear: External ear normal.  Left Ear: External ear normal.  Mouth/Throat: Oropharynx is clear and moist.  Hearing aides in  Did not remove them today  Eyes: Pupils are equal, round, and reactive to light. Conjunctivae and EOM are normal. No scleral icterus.  Neck: Normal range of motion. Neck supple. No JVD present. Carotid bruit is not present. No thyromegaly present.  Cardiovascular: Normal rate, regular rhythm, normal heart sounds and intact distal pulses. Exam reveals no gallop.  Pulmonary/Chest: Effort normal and breath sounds normal. No respiratory distress. She has no wheezes. She exhibits no tenderness. No breast swelling, tenderness, discharge or bleeding.  Abdominal: Soft. Bowel sounds are normal. She exhibits no distension, no abdominal bruit and no mass. There is no tenderness.  Genitourinary: No breast swelling, tenderness, discharge or bleeding.  Genitourinary Comments: Breast exam: No mass, nodules, thickening, tenderness, bulging, retraction, inflamation, nipple discharge or skin changes noted.  No axillary or clavicular LA.        Musculoskeletal: Normal range of motion. She exhibits no edema or tenderness.  No kyphosis   Lymphadenopathy:    She has no cervical adenopathy.  Neurological: She is alert. She has normal reflexes. No cranial nerve deficit. She exhibits normal muscle tone. Coordination normal.  Skin: Skin is warm and dry. No rash noted. No erythema. No pallor.  Solar lentigines diffusely     Psychiatric: She has a normal mood and affect.          Assessment & Plan:   Problem List Items Addressed This Visit      Musculoskeletal and Integument   Osteoporosis    Due for 2 y dexa Will be done with 5 y of fosamax in October  Refilled until then  Good D and Ca intake and exercise No falls or fx      Relevant Medications   alendronate (FOSAMAX) 70 MG tablet   Other Relevant Orders   DG Bone Density     Genitourinary   Endometrial cancer ([place])    Doing well with follow up         Other   Homocysteinemia ([place])    Pt continues methylfolate      Routine general medical examination at a health care facility - Primary    Reviewed health habits including diet and exercise and skin cancer prevention Reviewed appropriate screening tests for age  Also reviewed health mt list, fam hx and immunization status , as well as social and family history   See HPI amw rev Labs rev  dexa ordered  Continues gyn f/u Disc Shingrix

## 2017-11-02 NOTE — Assessment & Plan Note (Signed)
Pt continues methylfolate

## 2017-11-02 NOTE — Patient Instructions (Addendum)
You can stop the fosamax in October  (it will be the 5 year mark)  If you are interested in the new shingles vaccine (Shingrix) - call your local pharmacy to check on coverage and availability  If interested call your pharmacy and got on a wait list    We will refer you for a bone density test

## 2017-11-02 NOTE — Assessment & Plan Note (Signed)
Due for 2 y dexa Will be done with 5 y of fosamax in October  Refilled until then  Good D and Ca intake and exercise No falls or fx

## 2017-11-02 NOTE — Assessment & Plan Note (Signed)
Reviewed health habits including diet and exercise and skin cancer prevention Reviewed appropriate screening tests for age  Also reviewed health mt list, fam hx and immunization status , as well as social and family history   See HPI amw rev Labs rev  dexa ordered  Continues gyn f/u Disc Shingrix

## 2017-11-23 ENCOUNTER — Ambulatory Visit: Payer: Medicare Other | Admitting: Psychology

## 2017-11-23 DIAGNOSIS — F4323 Adjustment disorder with mixed anxiety and depressed mood: Secondary | ICD-10-CM | POA: Diagnosis not present

## 2017-11-30 ENCOUNTER — Ambulatory Visit: Payer: Medicare Other | Admitting: Family

## 2017-11-30 ENCOUNTER — Encounter: Payer: Self-pay | Admitting: Family

## 2017-11-30 ENCOUNTER — Ambulatory Visit: Payer: Self-pay | Admitting: *Deleted

## 2017-11-30 VITALS — BP 114/68 | HR 80 | Temp 99.1°F | Ht 62.5 in | Wt 104.1 lb

## 2017-11-30 DIAGNOSIS — R1084 Generalized abdominal pain: Secondary | ICD-10-CM

## 2017-11-30 NOTE — Telephone Encounter (Signed)
Pt reports intermittent headache, 5/10 when occurs, onset 8 days ago. Nausea associated with headache at times; no emesis. Also reports increased fatigue. States Advil "Helps but it always comes back."  Area "top of head, temples."  H/O "stress headaches but these feel different."  Mother recently died. Denies any dizziness, weakness, CP, SOB. States headache with nausea "Worse in the morning "Wears off somewhat during day." States is not as active, not participating in activities she usually enjoys. Appt made for today with Jodi Mourning as [facility] practice full. Care advise given per protocol. Reason for Disposition . [1] MILD-MODERATE headache AND [2] present > 72 hours  Answer Assessment - Initial Assessment Questions 1. LOCATION: "Where does it hurt?"      Top of head, temples at times 2. ONSET: "When did the headache start?" (Minutes, hours or days)     8 days ago 3. PATTERN: "Does the pain come and go, or has it been constant since it started?"     Intermittent 4. SEVERITY: "How bad is the pain?" and "What does it keep you from doing?"  (e.g., Scale 1-10; mild, moderate, or severe)   - MILD (1-3): doesn't interfere with normal activities    - MODERATE (4-7): interferes with normal activities or awakens from sleep    - SEVERE (8-10): excruciating pain, unable to do any normal activities        5/10 5. RECURRENT SYMPTOM: "Have you ever had headaches before?" If so, ask: "When was the last time?" and "What happened that time?"      Yes, years ago "Stress headaches" 6. CAUSE: "What do you think is causing the headache?"    Unsure 7. MIGRAINE: "Have you been diagnosed with migraine headaches?" If so, ask: "Is this headache similar?"     Not similar to stress headaches 8. HEAD INJURY: "Has there been any recent injury to the head?"      no 9. OTHER SYMPTOMS: "Do you have any other symptoms?" (fever, stiff neck, eye pain, sore throat, cold symptoms)     Fatigue, nausea  Protocols used:  HEADACHE-A-AH

## 2017-11-30 NOTE — Progress Notes (Signed)
Veronica Ward is a 71 y.o. female with the following history as recorded in EpicCare:  Patient Active Problem List   Diagnosis Date Noted  . Foot fracture 07/10/2016  . Homocysteinemia (HCC) 05/09/2016  . Estrogen deficiency 07/21/2015  . Ashkenazi Jewish ancestry   . Endometrial cancer ([facility]CC) 11/03/2013  . History of uterine cancer 10/28/2013  . CIS (carcinoma in situ of cervix) 09/23/2013  . Encounter for Medicare annual wellness exam 03/12/2013  . Colon cancer screening 03/12/2013  . Routine general medical examination at a health care facility 03/03/2013  . Encounter for medication monitoring 11/23/2010  . SEBORRHEIC KERATOSIS 08/25/2009  . SLEEP DISORDER 12/09/2008  . HEMORRHOIDS 10/12/2008  . Osteoporosis 10/08/2007  . BASAL CELL CARCINOMA, FACE 06/21/2007  . FIBROCYSTIC BREAST DISEASE 06/21/2007  . PSORIASIS 06/21/2007  . URTICARIA 06/21/2007  . BASAL CELL CARCINOMA, FACE 06/21/2007    Current Outpatient Medications  Medication Sig Dispense Refill  . alendronate (FOSAMAX) 70 MG tablet TAKE 1 TABLET BY MOUTH 30 MINUTES BEFOREBREAKFAST ONCE A WEEK WITH ATLEAST 8 OZ. OF WATER. 12 tablet 1  . Ascorbic Acid (VITAMIN C PO) Take 1 tablet by mouth daily.    . CALCIUM PO Take 1,000 mg by mouth 2 (two) times daily.     . cholecalciferol (VITAMIN D) 400 UNITS TABS Take 400 Units by mouth daily.     . co-enzyme Q-10 30 MG capsule Take 30 mg by mouth daily.     . fish oil-omega-3 fatty acids 1000 MG capsule Take by mouth daily.     . ibuprofen (ADVIL,MOTRIN) 200 MG tablet Take 200 mg by mouth as needed for pain. Reported on 12/15/2015    . L-Methylfolate 7.5 MG TABS Take 1 tablet (7.5 mg total) by mouth daily. 30 tablet 11  . Lactobacillus (CVS PROBIOTIC ACIDOPHILUS) 10 MG CAPS Take 1 capsule by mouth daily.     . magnesium 30 MG tablet Take 30 mg by mouth daily. Reported on 12/15/2015    . Multiple Vitamin (MULTIVITAMIN) tablet Take 1 tablet by mouth daily.     No current  facility-administered medications for this visit.     Allergies: Bactrim [sulfamethoxazole-trimethoprim]; Benadryl [diphenhydramine hcl (sleep)]; Oxycodone-acetaminophen; and Sulfa antibiotics  Past Medical History:  Diagnosis Date  . Anemia   . Anxiety   . Ashkenazi Jewish ancestry   . Diffuse cystic mastopathy   . Disorder of bone and cartilage, unspecified   . Dysautonomia (HCC)    mild  . Endometrial cancer (HCC)   . Headache(784.0)   . History of shingles   . Hx of basal cell carcinoma    face  . Osteoporosis   . Other malaise and fatigue   . Other psoriasis   . Other seborrheic keratosis   . Pain in limb   . Predominant disturbance of emotions   . Radiation 01/06/14, 12/23/13, 12/16/13, 12/09/13   HDR brachytherapy  . Sleep disturbance, unspecified   . Unspecified hemorrhoids without mention of complication   . Urticaria, unspecified     Past Surgical History:  Procedure Laterality Date  . AXILLARY LYMPH NODE DISSECTION     left  . birthmark removal  childhood  . BREAST BIOPSY Left 2001   benign  . BREAST EXCISIONAL BIOPSY Left    benign  . BREAST LUMPECTOMY     left underarm  . cervical cancer  1973  . CERVICAL CONIZATION W/BX    . COLONOSCOPY  12/04   int. hemorrhoids  . DEXA  03/2002     oseopenia  . DEXA  07/2004   decreased BMD, osteopenia  . DEXA  02/2007   Osteopenia  . DEXA  9/10   Osteopenia slightly worse  . felon I&D  12/2010   Dr. Kuzma, I&D in OR (L index finger)  . FINGER SURGERY    . KNEE SURGERY     right  . ROBOTIC ASSISTED TOTAL HYSTERECTOMY WITH BILATERAL SALPINGO OOPHERECTOMY  10/03/2013   Robotic-assisted total laparoscopic hysterectomy, bilateral salpingo-oophorectomy, bilateral pelvic and para-aortic lymphadenectomy with sentinel lymph node dissection  . TONSILECTOMY, ADENOIDECTOMY, BILATERAL MYRINGOTOMY AND TUBES    . TUBAL LIGATION      Family History  Problem Relation Age of Onset  . Multiple myeloma Father   . Coronary artery  disease Father   . Transient ischemic attack Mother   . Multiple sclerosis Brother   . Lupus Maternal Aunt   . Breast cancer Maternal Aunt        dx 60s; deceased  . Cancer Maternal Grandfather        GI cancer or pancreatic; deceased 49  . Ovarian cancer Maternal Aunt        dx 40s; deceased 50s  . Colon cancer Neg Hx     Social History   Tobacco Use  . Smoking status: Former Smoker    Last attempt to quit: 07/25/1963    Years since quitting: 54.3  . Smokeless tobacco: Never Used  Substance Use Topics  . Alcohol use: Yes    Alcohol/week: 0.6 oz    Types: 1 Glasses of wine per week    Comment: 1/2 glass of wine a day    Subjective:  Patient presents with concerns for numerous "vague complaints" related to her stomach and headaches- notes that for the past month, she has been having some increased episodes of vomiting; notes that her abdomen feels "bloated and full." Denies any blood in her stool; up to date on colonoscopy; Of note, her mother died on April 14 and has been having increased anxiety/ poor sleep; has been having increased headache in the past week as well; known history of migraine headaches; last CT done in 2012; having increased fatigue recently; admits she is not wanting to exercise/ dance on a regular basis- "just don't have any get up and go."   Objective:  Vitals:   11/30/17 1306  BP: 114/68  Pulse: 80  Temp: 99.1 F (37.3 C)  TempSrc: Oral  SpO2: 97%  Weight: 104 lb 1.3 oz (47.2 kg)  Height: 5' 2.5" (1.588 m)    General: Well developed, well nourished, in no acute distress  Skin : Warm and dry.  Head: Normocephalic and atraumatic  Lungs: Respirations unlabored; clear to auscultation bilaterally without wheeze, rales, rhonchi  CVS exam: normal rate and regular rhythm.  Abdomen: Soft; nontender; nondistended; normoactive bowel sounds; no masses or hepatosplenomegaly  Musculoskeletal: No deformities; no active joint inflammation  Extremities: No edema,  cyanosis, clubbing  Vessels: Symmetric bilaterally  Neurologic: Alert and oriented; speech intact; face symmetrical; moves all extremities well; CNII-XII intact without focal deficit  Assessment:  1. Generalized abdominal pain     Plan:  Suspect majority of symptoms are anxiety/ depression related to recent loss of her mother; will update CT which appears to relieve both she and her husband; she admits she is always worried due to being a cancer survivor; encouraged to start seeing her therapist more regularly/ exercise/ reach out to friends and family for support; follow-up to be determined; follow-up with   her PCP for continued concerns/ symptoms;  Time spent 30 minutes; greater than 50% spent in counseling;  No follow-ups on file.  Orders Placed This Encounter  Procedures  . CT Abdomen Pelvis W Contrast    Standing Status:   Future    Standing Expiration Date:   03/03/2019    Order Specific Question:   If indicated for the ordered procedure, I authorize the administration of contrast media per Radiology protocol    Answer:   Yes    Order Specific Question:   Preferred imaging location?    Answer:   [address]    Order Specific Question:   Is Oral Contrast requested for this exam?    Answer:   Yes, Per Radiology protocol    Order Specific Question:   Radiology Contrast Protocol - do NOT remove file path    Answer:   _[email or web address]    Requested Prescriptions    No prescriptions requested or ordered in this encounter

## 2017-12-06 ENCOUNTER — Inpatient Hospital Stay: Admission: RE | Admit: 2017-12-06 | Payer: Self-pay | Source: Ambulatory Visit

## 2017-12-06 ENCOUNTER — Encounter: Payer: Self-pay | Admitting: Family Medicine

## 2017-12-06 ENCOUNTER — Ambulatory Visit: Payer: Medicare Other | Admitting: Family Medicine

## 2017-12-06 ENCOUNTER — Ambulatory Visit (INDEPENDENT_AMBULATORY_CARE_PROVIDER_SITE_OTHER)
Admission: RE | Admit: 2017-12-06 | Discharge: 2017-12-06 | Disposition: A | Discharge status: Home / Self Care | Payer: Medicare Other | Source: Ambulatory Visit

## 2017-12-06 ENCOUNTER — Other Ambulatory Visit (HOSPITAL_BASED_OUTPATIENT_CLINIC_OR_DEPARTMENT_OTHER): Payer: Self-pay

## 2017-12-06 VITALS — BP 96/60 | HR 75 | Temp 97.9°F | Ht 62.5 in | Wt 105.2 lb

## 2017-12-06 DIAGNOSIS — R519 Headache, unspecified: Secondary | ICD-10-CM | POA: Insufficient documentation

## 2017-12-06 DIAGNOSIS — M810 Age-related osteoporosis without current pathological fracture: Secondary | ICD-10-CM | POA: Diagnosis not present

## 2017-12-06 DIAGNOSIS — R51 Headache: Secondary | ICD-10-CM

## 2017-12-06 DIAGNOSIS — F4321 Adjustment disorder with depressed mood: Secondary | ICD-10-CM | POA: Diagnosis not present

## 2017-12-06 DIAGNOSIS — R1013 Epigastric pain: Secondary | ICD-10-CM

## 2017-12-06 DIAGNOSIS — G4489 Other headache syndrome: Secondary | ICD-10-CM | POA: Diagnosis not present

## 2017-12-06 MED ORDER — RANITIDINE HCL 150 MG PO CAPS: 150.0000 mg | ORAL_CAPSULE | Freq: Two times a day (BID) | ORAL | 1 refills | Status: DC

## 2017-12-06 MED ORDER — GABAPENTIN 100 MG PO CAPS: 100.0000 mg | ORAL_CAPSULE | Freq: Three times a day (TID) | ORAL | 3 refills | Status: DC

## 2017-12-06 NOTE — Assessment & Plan Note (Signed)
Loss of mother  Complex relationship/unresolved issues  Worsening headache/GI symptoms  F/u with counselor  (if needs new ref due to avail issues will let us know)  Good self care Gabapentin for HA  F/u 3-4 wk

## 2017-12-06 NOTE — Assessment & Plan Note (Signed)
Worsened by stress/grief and migraine   Start ranitidine 150 mg bid  F/u 3-4 wk  Bland diet if needed Self care Counseling

## 2017-12-06 NOTE — Assessment & Plan Note (Signed)
Acute on chronic  Worsened by stress/ grief /recently lost mother   Self care Fluids Counseling Gabapentin 100 mg tid (disc side eff)- worked well in the past F/u 3-4 wk

## 2017-12-06 NOTE — Patient Instructions (Addendum)
Let's start a low dose gabapentin 100 mg three times daily   (if too sedating - 2 times daily and then move up to three)   Also ranitidine twice daily for stomach acid  See Opal Sidles - if you cannot see her more often and you need to see someone else please let me know   Take care of yourself  Write in a journal if helpful   Keep me posted   Follow up with me in about 3-4 weeks

## 2017-12-06 NOTE — Progress Notes (Signed)
Subjective:    Patient ID: Veronica Ward, female    DOB: 03-29-1946, 72 y.o.   MRN: [id number]  HPI Here for headache with nausea and fatigue - 3 weeks   Wt Readings from Last 3 Encounters:  12/06/17 105 lb 4 oz (47.7 kg)  11/30/17 104 lb 1.3 oz (47.2 kg)  11/02/17 103 lb 12 oz (47.1 kg)   18.94 kg/m   Mother died 3 d after last appt - she was 99/expected and she did well initially  She started feeling down 2 wk later  Then stomach symptoms and headache and exhausted  Low motivation   She did talk to her counselor Pervis Hocking) shortly after the loss   A lot of belching (loud) and lots of flatus also - very bothersome  Not related to eating  Unsettled stomach - lower abdomen feels bloated and that moves up  Vomited twice after eating raspberries that may have been off    Hx of headaches- from stress Gabapentin worked in the past  Went to the headache clinic in the past and it got better  Head sensation is -"like a cloud" - light pressure (not pain) - a strange sensation  Then wakes up at 3 am with a bad headache  She bought some excedrin migraine - it made her very sick (GI) -did not tolerate  Took 3 tylenol - it helped much (did not realize this was too much)  Tolerates advil   Saw Jodi Mourning last week -told her she had migraine  Stressed today  Feels headache starting on the L  abd cramping a bit - ? Hunger  Eating the same  No diarrhea or constipation   (one day loose stool)   Too tired/unmotivated to dance/walk or get up    Patient Active Problem List   Diagnosis Date Noted  . Headache 12/06/2017  . Dyspepsia 12/06/2017  . Grief 12/06/2017  . Foot fracture 07/10/2016  . Homocysteinemia ([place]) 05/09/2016  . Estrogen deficiency 07/21/2015  . Ashkenazi Jewish ancestry   . Endometrial cancer ([place]) 11/03/2013  . History of uterine cancer 10/28/2013  . CIS (carcinoma in situ of cervix) 09/23/2013  . Encounter for Medicare annual wellness exam 03/12/2013    . Colon cancer screening 03/12/2013  . Routine general medical examination at a health care facility 03/03/2013  . Encounter for medication monitoring 11/23/2010  . SEBORRHEIC KERATOSIS 08/25/2009  . SLEEP DISORDER 12/09/2008  . HEMORRHOIDS 10/12/2008  . Osteoporosis 10/08/2007  . BASAL CELL CARCINOMA, FACE 06/21/2007  . FIBROCYSTIC BREAST DISEASE 06/21/2007  . PSORIASIS 06/21/2007  . URTICARIA 06/21/2007  . BASAL CELL CARCINOMA, FACE 06/21/2007   Past Medical History:  Diagnosis Date  . Anemia   . Anxiety   . Ashkenazi Jewish ancestry   . Diffuse cystic mastopathy   . Disorder of bone and cartilage, unspecified   . Dysautonomia ([facility])    mild  . Endometrial cancer ([place])   . Headache(784.0)   . History of shingles   . Hx of basal cell carcinoma    face  . Osteoporosis   . Other malaise and fatigue   . Other psoriasis   . Other seborrheic keratosis   . Pain in limb   . Predominant disturbance of emotions   . Radiation 01/06/14, 12/23/13, 12/16/13, 12/09/13   HDR brachytherapy  . Sleep disturbance, unspecified   . Unspecified hemorrhoids without mention of complication   . Urticaria, unspecified    Past Surgical History:  Procedure Laterality  Date  . AXILLARY LYMPH NODE DISSECTION     left  . birthmark removal  childhood  . BREAST BIOPSY Left 2001   benign  . BREAST EXCISIONAL BIOPSY Left    benign  . BREAST LUMPECTOMY     left underarm  . cervical cancer  1973  . CERVICAL CONIZATION W/BX    . COLONOSCOPY  12/04   int. hemorrhoids  . DEXA  03/2002   oseopenia  . DEXA  07/2004   decreased BMD, osteopenia  . DEXA  02/2007   Osteopenia  . DEXA  9/10   Osteopenia slightly worse  . felon I&D  12/2010   Dr. Fredna Dow, I&D in OR (L index finger)  . FINGER SURGERY    . KNEE SURGERY     right  . ROBOTIC ASSISTED TOTAL HYSTERECTOMY WITH BILATERAL SALPINGO OOPHERECTOMY  10/03/2013   Robotic-assisted total laparoscopic hysterectomy, bilateral salpingo-oophorectomy, bilateral  pelvic and para-aortic lymphadenectomy with sentinel lymph node dissection  . TONSILECTOMY, ADENOIDECTOMY, BILATERAL MYRINGOTOMY AND TUBES    . TUBAL LIGATION     Social History   Tobacco Use  . Smoking status: Former Smoker    Last attempt to quit: 07/25/1963    Years since quitting: 54.4  . Smokeless tobacco: Never Used  Substance Use Topics  . Alcohol use: Yes    Alcohol/week: 0.6 oz    Types: 1 Glasses of wine per week    Comment: 1/2 glass of wine a day  . Drug use: No   Family History  Problem Relation Age of Onset  . Multiple myeloma Father   . Coronary artery disease Father   . Transient ischemic attack Mother   . Multiple sclerosis Brother   . Lupus Maternal Aunt   . Breast cancer Maternal Aunt        dx 64s; deceased  . Cancer Maternal Grandfather        GI cancer or pancreatic; deceased 36  . Ovarian cancer Maternal Aunt        dx 60s; deceased 24s  . Colon cancer Neg Hx    Allergies  Allergen Reactions  . Bactrim [Sulfamethoxazole-Trimethoprim] Rash  . Benadryl [Diphenhydramine Hcl (Sleep)] Rash and Anxiety    Fever, body ache  . Oxycodone-Acetaminophen Anxiety  . Sulfa Antibiotics Rash   Current Outpatient Medications on File Prior to Visit  Medication Sig Dispense Refill  . alendronate (FOSAMAX) 70 MG tablet TAKE 1 TABLET BY MOUTH 30 MINUTES BEFOREBREAKFAST ONCE A WEEK WITH ATLEAST 8 OZ. OF WATER. 12 tablet 1  . Multiple Vitamin (MULTIVITAMIN) tablet Take 1 tablet by mouth daily.    . Ascorbic Acid (VITAMIN C PO) Take 1 tablet by mouth daily.    Marland Kitchen CALCIUM PO Take 1,000 mg by mouth 2 (two) times daily.     . cholecalciferol (VITAMIN D) 400 UNITS TABS Take 400 Units by mouth daily.     Marland Kitchen co-enzyme Q-10 30 MG capsule Take 30 mg by mouth daily.     . fish oil-omega-3 fatty acids 1000 MG capsule Take by mouth daily.     Marland Kitchen ibuprofen (ADVIL,MOTRIN) 200 MG tablet Take 200 mg by mouth as needed for pain. Reported on 12/15/2015    . L-Methylfolate 7.5 MG TABS Take  1 tablet (7.5 mg total) by mouth daily. (Patient not taking: Reported on 12/06/2017) 30 tablet 11  . Lactobacillus (CVS PROBIOTIC ACIDOPHILUS) 10 MG CAPS Take 1 capsule by mouth daily.     . magnesium 30 MG tablet Take  30 mg by mouth daily. Reported on 12/15/2015     No current facility-administered medications on file prior to visit.      Review of Systems  Constitutional: Positive for fatigue. Negative for activity change, appetite change, fever and unexpected weight change.  HENT: Negative for congestion, ear pain, rhinorrhea, sinus pressure and sore throat.   Eyes: Negative for pain, redness and visual disturbance.  Respiratory: Negative for cough, shortness of breath and wheezing.   Cardiovascular: Negative for chest pain and palpitations.  Gastrointestinal: Negative for abdominal pain, blood in stool, constipation and diarrhea.  Endocrine: Negative for polydipsia and polyuria.  Genitourinary: Negative for dysuria, frequency and urgency.  Musculoskeletal: Negative for arthralgias, back pain and myalgias.  Skin: Negative for pallor and rash.  Allergic/Immunologic: Negative for environmental allergies.  Neurological: Positive for headaches. Negative for dizziness, tremors, seizures, syncope, facial asymmetry, speech difficulty, weakness, light-headedness and numbness.  Hematological: Negative for adenopathy. Does not bruise/bleed easily.  Psychiatric/Behavioral: Positive for decreased concentration and dysphoric mood. Negative for self-injury, sleep disturbance and suicidal ideas. The patient is nervous/anxious.        Objective:   Physical Exam  Constitutional: She is oriented to person, place, and time. She appears well-developed and well-nourished. No distress.  Slim and well appearing   HENT:  Head: Normocephalic and atraumatic.  Right Ear: External ear normal.  Left Ear: External ear normal.  Nose: Nose normal.  Mouth/Throat: Oropharynx is clear and moist. No oropharyngeal  exudate.  No sinus tenderness No temporal tenderness  No TMJ tenderness  Eyes: Pupils are equal, round, and reactive to light. Conjunctivae and EOM are normal. Right eye exhibits no discharge. Left eye exhibits no discharge. No scleral icterus. Right eye exhibits no nystagmus. Left eye exhibits no nystagmus.  No nystagmus  Hearing aides present   Neck: Normal range of motion and full passive range of motion without pain. Neck supple. No JVD present. Carotid bruit is not present. No tracheal deviation present. No thyromegaly present.  Cardiovascular: Normal rate, regular rhythm and normal heart sounds.  No murmur heard. Pulmonary/Chest: Effort normal and breath sounds normal. No respiratory distress. She has no wheezes. She has no rales.  Abdominal: Soft. Bowel sounds are normal. She exhibits no distension and no mass. There is no tenderness.  Musculoskeletal: She exhibits no edema or tenderness.  Lymphadenopathy:    She has no cervical adenopathy.  Neurological: She is alert and oriented to person, place, and time. She has normal strength and normal reflexes. She displays no atrophy and no tremor. No cranial nerve deficit or sensory deficit. She exhibits normal muscle tone. She displays a negative Romberg sign. Coordination and gait normal.  No focal cerebellar signs   Skin: Skin is warm and dry. No rash noted. No pallor.  Psychiatric: She has a normal mood and affect. Her behavior is normal. Thought content normal. She is not agitated.  Seems generally stressed and fatigued  Not tearful  Candidly discusses grief and family situation           Assessment & Plan:   Problem List Items Addressed This Visit      Other   Dyspepsia    Worsened by stress/grief and migraine   Start ranitidine 150 mg bid  F/u 3-4 wk  Bland diet if needed Self care Counseling       Grief    Loss of mother  Complex relationship/unresolved issues  Worsening headache/GI symptoms  F/u with  counselor  (if needs new ref  due to avail issues will let us know)  Good self care Gabapentin for HA  F/u 3-4 wk       Headache - Primary    Acute on chronic  Worsened by stress/ grief /recently lost mother   Self care Fluids Counseling Gabapentin 100 mg tid (disc side eff)- worked well in the past F/u 3-4 wk        Relevant Medications   gabapentin (NEURONTIN) 100 MG capsule

## 2017-12-07 ENCOUNTER — Ambulatory Visit: Payer: Medicare Other | Admitting: Family Medicine

## 2017-12-10 ENCOUNTER — Other Ambulatory Visit: Payer: Self-pay

## 2017-12-10 ENCOUNTER — Ambulatory Visit
Admission: RE | Admit: 2017-12-10 | Discharge: 2017-12-10 | Disposition: A | Discharge status: Home / Self Care | Payer: Medicare Other | Source: Ambulatory Visit | Attending: Radiation Oncology | Admitting: Radiation Oncology

## 2017-12-10 ENCOUNTER — Encounter: Payer: Self-pay | Admitting: Radiation Oncology

## 2017-12-10 VITALS — BP 129/76 | HR 63 | Temp 98.4°F | Resp 18 | Wt 104.2 lb

## 2017-12-10 DIAGNOSIS — Z8542 Personal history of malignant neoplasm of other parts of uterus: Secondary | ICD-10-CM | POA: Insufficient documentation

## 2017-12-10 DIAGNOSIS — C541 Malignant neoplasm of endometrium: Secondary | ICD-10-CM

## 2017-12-10 DIAGNOSIS — G43909 Migraine, unspecified, not intractable, without status migrainosus: Secondary | ICD-10-CM | POA: Insufficient documentation

## 2017-12-10 DIAGNOSIS — K219 Gastro-esophageal reflux disease without esophagitis: Secondary | ICD-10-CM | POA: Insufficient documentation

## 2017-12-10 DIAGNOSIS — Z79899 Other long term (current) drug therapy: Secondary | ICD-10-CM | POA: Insufficient documentation

## 2017-12-10 NOTE — Progress Notes (Signed)
Veronica Ward is here for her follow-up appointment. Denies any vaginal pain.Denies any vaginal discharge,or hematuria. Denies any dysuria. States that she is having some nausea,due to her headaches. States that she has loose stools ,but not diarrhea. Patient states that she is sexually active so she is not using her dilator. Vitals:   12/10/17 1114  BP: 129/76  Pulse: 63  Resp: 18  Temp: 98.4 F (36.9 C)  TempSrc: Oral  SpO2: 100%  Weight: 104 lb 3.2 oz (47.3 kg)   Wt Readings from Last 3 Encounters:  12/10/17 104 lb 3.2 oz (47.3 kg)  12/06/17 105 lb 4 oz (47.7 kg)  11/30/17 104 lb 1.3 oz (47.2 kg)

## 2017-12-10 NOTE — Progress Notes (Signed)
Radiation Oncology         [phone]) [phone] ________________________________  Name: Veronica Ward MRN: [id number]  Date: 12/10/2017  DOB: 1945/08/04  Follow-Up Visit Note  CC: Tower, Wynelle Fanny, MD  Marti Sleigh,*   Diagnosis:  Endometrial cancer, serous carcinoma , stage IA  Interval Since Last Radiation: 3 years 11 months  May 19, May 26th, June 2, June 9, January 06, 2014: Proximal vagina, 30 gray in 5 fractions, 4.0 cm treatment length, vaginal brachytherapy  Narrative:  The patient returns today for routine follow-up. She is doing well overall. She is not using her vaginal dilator due to being sexually active at this time.   She notes that she was last seen by Dr. Fermin Schwab approximately 6 months ago with a pap smear completed at that time.   She notes that she has had a lot of life stressors with her mother dying recently. She has followed up with her PCP for recurrent headaches that is being managed by her PCP.  She reports a history of migraines.  Since her last visit, she had a pap completed on 05/25/2017 with results showing: Negative for intraepithelial lesions or malignancy. Benign reactive/reparative changes.   On review of systems, she reports abdominal bloating and burping that is alleviated with Rx Zantac. She notes that her GERD is mildly alleviated with Zantac. She denies vaginal bleeding, blood in stool, hematuria, and any other symptoms. Pertinent positives are listed and detailed within the above HPI.   ALLERGIES:  is allergic to bactrim [sulfamethoxazole-trimethoprim]; benadryl [diphenhydramine hcl (sleep)]; oxycodone-acetaminophen; and sulfa antibiotics.  Meds: Current Outpatient Medications  Medication Sig Dispense Refill  . CALCIUM PO Take 1,000 mg by mouth 2 (two) times daily.     . cholecalciferol (VITAMIN D) 400 UNITS TABS Take 400 Units by mouth daily.     Marland Kitchen co-enzyme Q-10 30 MG capsule Take 30 mg by mouth daily.     . fish oil-omega-3 fatty  acids 1000 MG capsule Take by mouth daily.     Marland Kitchen gabapentin (NEURONTIN) 100 MG capsule Take 1 capsule (100 mg total) by mouth 3 (three) times daily. 90 capsule 3  . ibuprofen (ADVIL,MOTRIN) 200 MG tablet Take 200 mg by mouth as needed for pain. Reported on 12/15/2015    . L-Methylfolate 7.5 MG TABS Take 1 tablet (7.5 mg total) by mouth daily. 30 tablet 11  . Lactobacillus (CVS PROBIOTIC ACIDOPHILUS) 10 MG CAPS Take 1 capsule by mouth daily.     . Multiple Vitamin (MULTIVITAMIN) tablet Take 1 tablet by mouth daily.    . ranitidine (ZANTAC) 150 MG capsule Take 1 capsule (150 mg total) by mouth 2 (two) times daily. 60 capsule 1  . alendronate (FOSAMAX) 70 MG tablet TAKE 1 TABLET BY MOUTH 30 MINUTES BEFOREBREAKFAST ONCE A WEEK WITH ATLEAST 8 OZ. OF WATER. (Patient not taking: Reported on 12/10/2017) 12 tablet 1  . Ascorbic Acid (VITAMIN C PO) Take 1 tablet by mouth daily.    . magnesium 30 MG tablet Take 30 mg by mouth daily. Reported on 12/15/2015     No current facility-administered medications for this encounter.     Physical Findings: The patient is in no acute distress. Patient is alert and oriented.  weight is 104 lb 3.2 oz (47.3 kg). Her oral temperature is 98.4 F (36.9 C). Her blood pressure is 129/76 and her pulse is 63. Her respiration is 18 and oxygen saturation is 100%.   The lungs are clear to auscultation. The heart  has regular rhythm and rate. The abdomen is soft nontender with normal bowel sounds.  No inguinal adenopathy appreciated. On pelvic examination the external genitalia are unremarkable. A speculum exam is performed. There are no mucosal lesions noted in the vaginal vault. On bimanual and rectovaginal examination no pelvic masses were appreciated.   Lab Findings: Lab Results  Component Value Date   WBC 4.5 10/29/2017   HGB 12.8 10/29/2017   HCT 37.8 10/29/2017   MCV 98.8 10/29/2017   PLT 167.0 10/29/2017    Radiographic Findings: Dg Bone Density  Result Date:  12/09/2017 Date of study: 12/06/17 Exam: DUAL X-RAY ABSORPTIOMETRY (DXA) FOR BONE MINERAL DENSITY (BMD) Instrument: [employer] Provider: PCP Indication: follow up for low BMD Comparison: none (please note that it is not possible to compare data from different instruments) Clinical data: Pt is a 72 y.o. female without previous history of fracture. On calcium and vitamin D and Fosamax Results:  Lumbar spine L1-L4 Femoral neck (FN) 33% distal radius T-score -2.1 RFN: -2.4 LFN: -2.3  n/a Change in BMD from previous DXA test (%) n/a n/a n/a (*) statistically significant Assessment: the BMD is low according to the [facility] classification for osteoporosis (see below). Fracture risk: moderate FRAX score: not calculated due to treatment with Fosamax Comments: the technical quality of the study is good. Evaluation for secondary causes should be considered if clinically indicated. Recommend optimizing calcium (1200 mg/day) and vitamin D (800 IU/day) intake. Followup: Repeat BMD is appropriate after 2 years or after 1-2 years if starting treatment. WHO criteria for diagnosis of osteoporosis in postmenopausal women and in men 46 y/o or older: - normal: T-score -1.0 to + 1.0 - osteopenia/low bone density: T-score between -2.5 and -1.0 - osteoporosis: T-score below -2.5 - severe osteoporosis: T-score below -2.5 with history of fragility fracture Note: although not part of the WHO classification, the presence of a fragility fracture, regardless of the T-score, should be considered diagnostic of osteoporosis, provided other causes for the fracture have been excluded. Treatment: The National Osteoporosis Foundation recommends that treatment be considered in postmenopausal women and men age 65 or older with: 1. Hip or vertebral (clinical or morphometric) fracture 2. T-score of - 2.5 or lower at the spine or hip 3. 10-year fracture probability by FRAX of at least 20% for a major osteoporotic fracture and 3% for a hip  fracture [name] MD    Impression:  No evidence recurrence on clinical exam today.  Plan: Routine follow up with radiation oncology in one year. Patient will follow up with Dr. Fermin Schwab in 6 months.  ____________________________________  Blair Promise, PhD, MD  This document serves as a record of services personally performed by Gery Pray, MD. It was created on his behalf by [facility], a trained medical scribe. The creation of this record is based on the scribe's personal observations and the provider's statements to them. This document has been checked and approved by the attending provider.

## 2017-12-14 ENCOUNTER — Other Ambulatory Visit: Payer: Self-pay

## 2017-12-14 ENCOUNTER — Ambulatory Visit (INDEPENDENT_AMBULATORY_CARE_PROVIDER_SITE_OTHER): Payer: Medicare Other | Admitting: Psychology

## 2017-12-14 DIAGNOSIS — F4323 Adjustment disorder with mixed anxiety and depressed mood: Secondary | ICD-10-CM

## 2017-12-18 ENCOUNTER — Ambulatory Visit: Payer: Medicare Other | Admitting: Psychology

## 2017-12-31 ENCOUNTER — Ambulatory Visit: Payer: Medicare Other | Admitting: Family Medicine

## 2017-12-31 ENCOUNTER — Encounter: Payer: Self-pay | Admitting: Family Medicine

## 2017-12-31 VITALS — BP 110/66 | HR 69 | Temp 98.4°F | Ht 62.5 in | Wt 105.0 lb

## 2017-12-31 DIAGNOSIS — G4489 Other headache syndrome: Secondary | ICD-10-CM | POA: Diagnosis not present

## 2017-12-31 DIAGNOSIS — M810 Age-related osteoporosis without current pathological fracture: Secondary | ICD-10-CM

## 2017-12-31 DIAGNOSIS — F4321 Adjustment disorder with depressed mood: Secondary | ICD-10-CM | POA: Diagnosis not present

## 2017-12-31 DIAGNOSIS — R1013 Epigastric pain: Secondary | ICD-10-CM

## 2017-12-31 NOTE — Assessment & Plan Note (Signed)
Doing better with dec in stress (also grief and stress counseling)  Also gabapentin 100 mg tid Will try to dec to bid  (causing some sexual side effects)  Then take as long as she needs to  Good lifestyle habits Enc her to continue counseling

## 2017-12-31 NOTE — Assessment & Plan Note (Signed)
Overall doing better with counseling  The gabapentin may also be helping mood/anxiety Will continue to follow

## 2017-12-31 NOTE — Progress Notes (Signed)
Subjective:    Patient ID: Veronica Ward, female    DOB: 24-Dec-1945, 72 y.o.   MRN: [id number]  HPI Here for f/u of chronic medical problems   Wt Readings from Last 3 Encounters:  12/31/17 105 lb (47.6 kg)  12/10/17 104 lb 3.2 oz (47.3 kg)  12/06/17 105 lb 4 oz (47.7 kg)   18.90 kg/m   Last visit started gabapentin low dose for migraine  Much much better  Has not needed ibuprofen  No side effects from gabapentin except sexual sexual issues - harder to climax    Switched from wine to beer    She cut out supplements  Also fosamax - since last visit - will go back until she is done in the fall    Zantac for dyspepsia  Twice daily  No problems  Appetite is ok - same as always   Dealing with grief -loss of mother  Feeling better overall  Seeing Pervis Hocking -once weekly   Also thinking about starting a new novel  Much happier   Reviewed dexa  Dances Ca and D Last score lowest -2.4  No new fractures  Very active   Patient Active Problem List   Diagnosis Date Noted  . Headache 12/06/2017  . Dyspepsia 12/06/2017  . Grief 12/06/2017  . Foot fracture 07/10/2016  . Homocysteinemia ([place]) 05/09/2016  . Estrogen deficiency 07/21/2015  . Ashkenazi Jewish ancestry   . Endometrial cancer ([place]) 11/03/2013  . History of uterine cancer 10/28/2013  . CIS (carcinoma in situ of cervix) 09/23/2013  . Encounter for Medicare annual wellness exam 03/12/2013  . Colon cancer screening 03/12/2013  . Routine general medical examination at a health care facility 03/03/2013  . Encounter for medication monitoring 11/23/2010  . SEBORRHEIC KERATOSIS 08/25/2009  . SLEEP DISORDER 12/09/2008  . HEMORRHOIDS 10/12/2008  . Osteoporosis 10/08/2007  . BASAL CELL CARCINOMA, FACE 06/21/2007  . FIBROCYSTIC BREAST DISEASE 06/21/2007  . PSORIASIS 06/21/2007  . URTICARIA 06/21/2007  . BASAL CELL CARCINOMA, FACE 06/21/2007   Past Medical History:  Diagnosis Date  . Anemia   . Anxiety   .  Ashkenazi Jewish ancestry   . Diffuse cystic mastopathy   . Disorder of bone and cartilage, unspecified   . Dysautonomia (HCC)    mild  . Endometrial cancer ([place])   . Headache(784.0)   . History of shingles   . Hx of basal cell carcinoma    face  . Osteoporosis   . Other malaise and fatigue   . Other psoriasis   . Other seborrheic keratosis   . Pain in limb   . Predominant disturbance of emotions   . Radiation 01/06/14, 12/23/13, 12/16/13, 12/09/13   HDR brachytherapy  . Sleep disturbance, unspecified   . Unspecified hemorrhoids without mention of complication   . Urticaria, unspecified    Past Surgical History:  Procedure Laterality Date  . AXILLARY LYMPH NODE DISSECTION     left  . birthmark removal  childhood  . BREAST BIOPSY Left 2001   benign  . BREAST EXCISIONAL BIOPSY Left    benign  . BREAST LUMPECTOMY     left underarm  . cervical cancer  1973  . CERVICAL CONIZATION W/BX    . COLONOSCOPY  12/04   int. hemorrhoids  . DEXA  03/2002   oseopenia  . DEXA  07/2004   decreased BMD, osteopenia  . DEXA  02/2007   Osteopenia  . DEXA  9/10   Osteopenia slightly worse  .  felon I&D  12/2010   Dr. Fredna Dow, I&D in OR (L index finger)  . FINGER SURGERY    . KNEE SURGERY     right  . ROBOTIC ASSISTED TOTAL HYSTERECTOMY WITH BILATERAL SALPINGO OOPHERECTOMY  10/03/2013   Robotic-assisted total laparoscopic hysterectomy, bilateral salpingo-oophorectomy, bilateral pelvic and para-aortic lymphadenectomy with sentinel lymph node dissection  . TONSILECTOMY, ADENOIDECTOMY, BILATERAL MYRINGOTOMY AND TUBES    . TUBAL LIGATION     Social History   Tobacco Use  . Smoking status: Former Smoker    Last attempt to quit: 07/25/1963    Years since quitting: 54.4  . Smokeless tobacco: Never Used  Substance Use Topics  . Alcohol use: Yes    Alcohol/week: 0.6 oz    Types: 1 Glasses of wine per week    Comment: 1/2 glass of wine a day  . Drug use: No   Family History  Problem Relation Age  of Onset  . Multiple myeloma Father   . Coronary artery disease Father   . Transient ischemic attack Mother   . Multiple sclerosis Brother   . Lupus Maternal Aunt   . Breast cancer Maternal Aunt        dx 46s; deceased  . Cancer Maternal Grandfather        GI cancer or pancreatic; deceased 57  . Ovarian cancer Maternal Aunt        dx 21s; deceased 41s  . Colon cancer Neg Hx    Allergies  Allergen Reactions  . Bactrim [Sulfamethoxazole-Trimethoprim] Rash  . Benadryl [Diphenhydramine Hcl (Sleep)] Rash and Anxiety    Fever, body ache  . Oxycodone-Acetaminophen Anxiety  . Sulfa Antibiotics Rash   Current Outpatient Medications on File Prior to Visit  Medication Sig Dispense Refill  . gabapentin (NEURONTIN) 100 MG capsule Take 1 capsule (100 mg total) by mouth 3 (three) times daily. 90 capsule 3  . ranitidine (ZANTAC) 150 MG capsule Take 1 capsule (150 mg total) by mouth 2 (two) times daily. 60 capsule 1  . alendronate (FOSAMAX) 70 MG tablet TAKE 1 TABLET BY MOUTH 30 MINUTES BEFOREBREAKFAST ONCE A WEEK WITH ATLEAST 8 OZ. OF WATER. (Patient not taking: Reported on 12/31/2017) 12 tablet 1  . Ascorbic Acid (VITAMIN C PO) Take 1 tablet by mouth daily.    Marland Kitchen CALCIUM PO Take 1,000 mg by mouth 2 (two) times daily.     . cholecalciferol (VITAMIN D) 400 UNITS TABS Take 400 Units by mouth daily.     Marland Kitchen co-enzyme Q-10 30 MG capsule Take 30 mg by mouth daily.     . fish oil-omega-3 fatty acids 1000 MG capsule Take by mouth daily.     Marland Kitchen ibuprofen (ADVIL,MOTRIN) 200 MG tablet Take 200 mg by mouth as needed for pain. Reported on 12/15/2015    . L-Methylfolate 7.5 MG TABS Take 1 tablet (7.5 mg total) by mouth daily. (Patient not taking: Reported on 12/31/2017) 30 tablet 11  . Lactobacillus (CVS PROBIOTIC ACIDOPHILUS) 10 MG CAPS Take 1 capsule by mouth daily.     . magnesium 30 MG tablet Take 30 mg by mouth daily. Reported on 12/15/2015    . Multiple Vitamin (MULTIVITAMIN) tablet Take 1 tablet by mouth  daily.     No current facility-administered medications on file prior to visit.     Review of Systems  Constitutional: Negative for activity change, appetite change, fatigue, fever and unexpected weight change.  HENT: Negative for congestion, ear pain, rhinorrhea, sinus pressure and sore throat.  Eyes: Negative for pain, redness and visual disturbance.  Respiratory: Negative for cough, shortness of breath and wheezing.   Cardiovascular: Negative for chest pain and palpitations.  Gastrointestinal: Negative for abdominal distention, abdominal pain, blood in stool, constipation, diarrhea and nausea.       Much imp in GI symptoms   Endocrine: Negative for polydipsia and polyuria.  Genitourinary: Negative for dysuria, frequency and urgency.  Musculoskeletal: Negative for arthralgias, back pain and myalgias.  Skin: Negative for pallor and rash.  Allergic/Immunologic: Negative for environmental allergies.  Neurological: Negative for dizziness, tremors, syncope, speech difficulty, weakness, light-headedness, numbness and headaches.  Hematological: Negative for adenopathy. Does not bruise/bleed easily.  Psychiatric/Behavioral: Negative for decreased concentration and dysphoric mood. The patient is not nervous/anxious.        Mood is improved        Objective:   Physical Exam  Constitutional: She appears well-developed and well-nourished. No distress.  Slim and well appearing   HENT:  Head: Normocephalic and atraumatic.  Mouth/Throat: Oropharynx is clear and moist.  Eyes: Pupils are equal, round, and reactive to light. Conjunctivae and EOM are normal. No scleral icterus.  Neck: Normal range of motion. Neck supple.  Cardiovascular: Normal rate, regular rhythm and normal heart sounds.  Pulmonary/Chest: Effort normal and breath sounds normal. No respiratory distress. She has no wheezes. She has no rales.  Abdominal: Soft. Bowel sounds are normal. She exhibits no distension and no mass. There  is no tenderness. There is no rebound and no guarding.  Lymphadenopathy:    She has no cervical adenopathy.  Neurological: She is alert. She displays normal reflexes. No cranial nerve deficit. She exhibits normal muscle tone. Coordination normal.  No tremor   Skin: Skin is warm and dry. No erythema. No pallor.  Psychiatric: She has a normal mood and affect. Her behavior is normal.  Mood is good-improved Less anxious Positive outlook          Assessment & Plan:   Problem List Items Addressed This Visit      Musculoskeletal and Integument   Osteoporosis    Plans to start back on fosamax (to finish course in the fall)- unless it causes GI upset         Other   Dyspepsia    Much improved with zantac 150 mg bid and also less stress  Will cut back to QD  If doing well after several weeks- stop it  Will alert if return of symptoms  Disc healthy diet       Grief    Overall doing better with counseling  The gabapentin may also be helping mood/anxiety Will continue to follow      Headache - Primary    Doing better with dec in stress (also grief and stress counseling)  Also gabapentin 100 mg tid Will try to dec to bid  (causing some sexual side effects)  Then take as long as she needs to  Good lifestyle habits Enc her to continue counseling

## 2017-12-31 NOTE — Patient Instructions (Addendum)
Decrease your gabapentin to twice daily   Am and pm  See how you feel from there - you can come off of it any time   So glad you are feeling better   Go ahead and cut the zantac to once daily  If still well in 1-2 weeks - try to stop it   Keep up the exercise and take care of yourself

## 2017-12-31 NOTE — Assessment & Plan Note (Signed)
Plans to start back on fosamax (to finish course in the fall)- unless it causes GI upset

## 2017-12-31 NOTE — Assessment & Plan Note (Signed)
Much improved with zantac 150 mg bid and also less stress  Will cut back to QD  If doing well after several weeks- stop it  Will alert if return of symptoms  Disc healthy diet

## 2018-01-01 ENCOUNTER — Ambulatory Visit: Payer: Medicare Other | Admitting: Psychology

## 2018-01-01 DIAGNOSIS — F4323 Adjustment disorder with mixed anxiety and depressed mood: Secondary | ICD-10-CM

## 2018-01-08 ENCOUNTER — Ambulatory Visit: Payer: Medicare Other | Admitting: Psychology

## 2018-01-08 DIAGNOSIS — F4323 Adjustment disorder with mixed anxiety and depressed mood: Secondary | ICD-10-CM

## 2018-01-15 ENCOUNTER — Ambulatory Visit (INDEPENDENT_AMBULATORY_CARE_PROVIDER_SITE_OTHER): Payer: Medicare Other | Admitting: Psychology

## 2018-01-15 DIAGNOSIS — F4323 Adjustment disorder with mixed anxiety and depressed mood: Secondary | ICD-10-CM | POA: Diagnosis not present

## 2018-01-22 ENCOUNTER — Ambulatory Visit: Payer: Medicare Other | Admitting: Psychology

## 2018-01-22 DIAGNOSIS — F4323 Adjustment disorder with mixed anxiety and depressed mood: Secondary | ICD-10-CM | POA: Diagnosis not present

## 2018-01-29 ENCOUNTER — Ambulatory Visit: Payer: Medicare Other | Admitting: Psychology

## 2018-02-05 ENCOUNTER — Ambulatory Visit: Payer: Medicare Other | Admitting: Psychology

## 2018-02-12 ENCOUNTER — Ambulatory Visit: Payer: Medicare Other | Admitting: Psychology

## 2018-02-19 ENCOUNTER — Ambulatory Visit (INDEPENDENT_AMBULATORY_CARE_PROVIDER_SITE_OTHER): Payer: Medicare Other | Admitting: Psychology

## 2018-02-19 DIAGNOSIS — F4323 Adjustment disorder with mixed anxiety and depressed mood: Secondary | ICD-10-CM | POA: Diagnosis not present

## 2018-02-26 ENCOUNTER — Ambulatory Visit: Payer: Medicare Other | Admitting: Psychology

## 2018-03-05 ENCOUNTER — Ambulatory Visit: Payer: Medicare Other | Admitting: Psychology

## 2018-03-05 DIAGNOSIS — F4323 Adjustment disorder with mixed anxiety and depressed mood: Secondary | ICD-10-CM

## 2018-03-06 ENCOUNTER — Telehealth: Payer: Self-pay

## 2018-03-06 NOTE — Telephone Encounter (Signed)
Received VM from pt to make an appt for Dr Aldean Ast for November.  Called pt back and no answer, left her a VM with available dates for Dr Aldean Ast for November are 12th and 26 th and for her to call our office back.

## 2018-03-11 ENCOUNTER — Telehealth: Payer: Self-pay

## 2018-03-11 NOTE — Telephone Encounter (Signed)
Incoming call from pt to make f/u appt with Dr Fermin Schwab- appt made for Nov 12th 10:30 am- no other needs per pt at this time.

## 2018-03-12 ENCOUNTER — Telehealth: Payer: Self-pay | Admitting: Family Medicine

## 2018-03-12 NOTE — Telephone Encounter (Signed)
Copied from [place] [id number]. Topic: Quick Communication - See Telephone Encounter >> Mar 12, 2018  3:51 PM Vernona Rieger wrote: CRM for notification. See Telephone encounter for: 03/12/18.  Patient said that she and her husband are flying out on 8/30 and coming back on 9/16 to [place]. She is wanting to know is there anything special that she needs to do? Call back # [phone] or [phone]

## 2018-03-12 NOTE — Telephone Encounter (Signed)
It looks like there are several vaccines recommended including hep A and Hep B (she had the first hep A here but may need the booster)  Depending on where they are- typhoid and yellow fever vaccines may be needed  She will need to make an appt at her [facility] - since we do not have those here  They should be able to counsel her and get her caught up with anything recommended

## 2018-03-13 NOTE — Telephone Encounter (Signed)
Pt notified of Dr. Marliss Coots instructions and verbalized understanding. Pt will call travel clinic

## 2018-03-14 ENCOUNTER — Ambulatory Visit (INDEPENDENT_AMBULATORY_CARE_PROVIDER_SITE_OTHER): Payer: Medicare Other

## 2018-03-14 DIAGNOSIS — Z23 Encounter for immunization: Secondary | ICD-10-CM | POA: Diagnosis not present

## 2018-03-19 ENCOUNTER — Ambulatory Visit: Payer: Medicare Other | Admitting: Psychology

## 2018-03-19 DIAGNOSIS — F4323 Adjustment disorder with mixed anxiety and depressed mood: Secondary | ICD-10-CM

## 2018-03-20 ENCOUNTER — Telehealth: Payer: Self-pay | Admitting: Family Medicine

## 2018-03-20 MED ORDER — ALPRAZOLAM 0.5 MG PO TABS: 0.5000 mg | ORAL_TABLET | Freq: Two times a day (BID) | ORAL | 0 refills | Status: DC | PRN

## 2018-03-20 NOTE — Telephone Encounter (Signed)
Copied from [place] [id number]. Topic: Quick Communication - See Telephone Encounter >> Mar 20, 2018  3:49 PM Percell Belt A wrote: CRM for notification. See Telephone encounter for: 03/20/18. Pt called in and stated that she is going to [place] Friday.  She meet with Counselor and she told her that it might be helpful if she had her MD Doctor to called in her a few Xanx to claim some of her anxiety.  She is also having trouble sleeping.  She wanted to sent message to see if Dr Glori Bickers would do this without her coming in since she was so pressed for time?  .  [place] in [place]

## 2018-03-20 NOTE — Telephone Encounter (Signed)
Pt notified Rx sent and advised of all of Dr. Marliss Coots instructions/ recommendations and verbalized understanding

## 2018-03-20 NOTE — Telephone Encounter (Signed)
Use with caution of sedation/habit and falls Do not drive with it  Use as needed Hope all goes well  I sent px

## 2018-04-10 ENCOUNTER — Other Ambulatory Visit: Payer: Self-pay | Admitting: Family Medicine

## 2018-04-10 NOTE — Telephone Encounter (Signed)
Last filled on 12/06/17 #90 caps with 3 additional refills, this refill request is for a 90 day supply, last OV was 12/31/17, please advise

## 2018-04-16 ENCOUNTER — Ambulatory Visit (INDEPENDENT_AMBULATORY_CARE_PROVIDER_SITE_OTHER): Payer: Medicare Other | Admitting: Psychology

## 2018-04-16 DIAGNOSIS — F4323 Adjustment disorder with mixed anxiety and depressed mood: Secondary | ICD-10-CM | POA: Diagnosis not present

## 2018-04-19 ENCOUNTER — Telehealth: Payer: Self-pay | Admitting: Family Medicine

## 2018-04-19 NOTE — Telephone Encounter (Signed)
Pt wanted to let you know she has only take 3 xantax all together from last rx.

## 2018-04-19 NOTE — Telephone Encounter (Signed)
Good, thanks for the update

## 2018-04-30 ENCOUNTER — Ambulatory Visit: Payer: Medicare Other | Admitting: Psychology

## 2018-04-30 DIAGNOSIS — F4323 Adjustment disorder with mixed anxiety and depressed mood: Secondary | ICD-10-CM

## 2018-05-02 ENCOUNTER — Ambulatory Visit (INDEPENDENT_AMBULATORY_CARE_PROVIDER_SITE_OTHER): Payer: Medicare Other

## 2018-05-02 DIAGNOSIS — Z23 Encounter for immunization: Secondary | ICD-10-CM

## 2018-05-14 ENCOUNTER — Ambulatory Visit: Payer: Medicare Other | Admitting: Psychology

## 2018-05-14 DIAGNOSIS — F4323 Adjustment disorder with mixed anxiety and depressed mood: Secondary | ICD-10-CM

## 2018-05-28 ENCOUNTER — Ambulatory Visit: Payer: Medicare Other | Admitting: Psychology

## 2018-05-30 ENCOUNTER — Telehealth: Payer: Self-pay

## 2018-05-30 NOTE — Telephone Encounter (Signed)
I will see her then  

## 2018-05-30 NOTE — Telephone Encounter (Signed)
Pt called for a few days pt has had congestion, H/A, sneezing, non prod cough, ? Chills or feels cold on and off; pt not sure and does not have thermometer. Pt feels totally exhausted. Pt has been taking mucinex and thought yesterday she was feeling better but today feeling bad again with above symptoms except H/a has subsided and not coughing a lot right now. Pt said she does not have diarrhea but "stomach just is not right". Pt will continue taking mucinex and scheduled appt with Dr Glori Bickers 05/31/18 at 4:15 at daughters recommendation. FYI to Dr Glori Bickers.

## 2018-05-31 ENCOUNTER — Encounter: Payer: Self-pay | Admitting: Family Medicine

## 2018-05-31 ENCOUNTER — Ambulatory Visit: Payer: Medicare Other | Admitting: Family Medicine

## 2018-05-31 VITALS — BP 138/74 | HR 75 | Temp 98.7°F | Ht 62.5 in | Wt 104.2 lb

## 2018-05-31 DIAGNOSIS — R1013 Epigastric pain: Secondary | ICD-10-CM

## 2018-05-31 DIAGNOSIS — B349 Viral infection, unspecified: Secondary | ICD-10-CM | POA: Diagnosis not present

## 2018-05-31 DIAGNOSIS — M810 Age-related osteoporosis without current pathological fracture: Secondary | ICD-10-CM | POA: Diagnosis not present

## 2018-05-31 DIAGNOSIS — F430 Acute stress reaction: Secondary | ICD-10-CM

## 2018-05-31 NOTE — Patient Instructions (Addendum)
I think it is ok to go ahead and stop (be done with) fosamax   If you need something for stomach acid- pepcid is fine   Try to take breaks and get rest when you can You need a day off ! (mentally and physically)  Stay hydrated  Continue probiotics   See Opal Sidles as planned   A dose of immodium occasionally is ok for diarrhea to get through events   Check out meditation app- called Calm (it has good programs for sleep and meditation)

## 2018-05-31 NOTE — Progress Notes (Signed)
Subjective:    Patient ID: Veronica Ward, female    DOB: [birth year], 72 y.o.   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 (a lot of doubt and worry and excitement)  More tired/ trouble sleeping lately (wakes up early)  Sunday- started with uri symptoms  Runny nose/sneezing and congestion  More tired the next day  Started some mucinex on Tuesday -helped congestion and cough  Felt a little nausea /upset stomach  Wednesday-more miserable and stayed in  Ate chicken soup with curry and tumeric /herbal tea and honey Then loose stools  A little hemorrhoidal brb with wiping once  Feels very bloated   Has gyn f/u for endom cancer next Tuesday Sees counselor every 2 weeks Opal Sidles)  Question about vit D intake   Her zantac was on the recall list  She used it prn    Fosamax - is done with   Wt Readings from Last 3 Encounters:  05/31/18 104 lb 4 oz (47.3 kg)  12/31/17 105 lb (47.6 kg)  12/10/17 104 lb 3.2 oz (47.3 kg)   18.76 kg/m   occ takes 1/2 xanax   Patient Active Problem List   Diagnosis Date Noted  . Viral syndrome 05/31/2018  . Stress reaction 05/31/2018  . Headache 12/06/2017  . Dyspepsia 12/06/2017  . Grief 12/06/2017  . Foot fracture 07/10/2016  . Homocysteinemia ([place]) 05/09/2016  . Estrogen deficiency 07/21/2015  . Ashkenazi Jewish ancestry   . Endometrial cancer ([place]) 11/03/2013  . History of uterine cancer 10/28/2013  . CIS (carcinoma in situ of cervix) 09/23/2013  . Encounter for Medicare annual wellness exam 03/12/2013  . Colon cancer screening 03/12/2013  . Routine general medical examination at a health care facility 03/03/2013  . Encounter for medication monitoring 11/23/2010  . SEBORRHEIC KERATOSIS 08/25/2009  . SLEEP DISORDER 12/09/2008  . HEMORRHOIDS 10/12/2008  . Osteoporosis 10/08/2007  .  BASAL CELL CARCINOMA, FACE 06/21/2007  . FIBROCYSTIC BREAST DISEASE 06/21/2007  . PSORIASIS 06/21/2007  . URTICARIA 06/21/2007  . BASAL CELL CARCINOMA, FACE 06/21/2007   Past Medical History:  Diagnosis Date  . Anemia   . Anxiety   . Ashkenazi Jewish ancestry   . Diffuse cystic mastopathy   . Disorder of bone and cartilage, unspecified   . Dysautonomia (HCC)    mild  . Endometrial cancer ([place])   . Headache(784.0)   . History of shingles   . Hx of basal cell carcinoma    face  . Osteoporosis   . Other malaise and fatigue   . Other psoriasis   . Other seborrheic keratosis   . Pain in limb   . Predominant disturbance of emotions   . Radiation 01/06/14, 12/23/13, 12/16/13, 12/09/13   HDR brachytherapy  . Sleep disturbance, unspecified   . Unspecified hemorrhoids without mention of complication   . Urticaria, unspecified    Past Surgical History:  Procedure Laterality Date  . AXILLARY LYMPH NODE DISSECTION     left  . birthmark removal  childhood  . BREAST BIOPSY Left 2001   benign  . BREAST EXCISIONAL BIOPSY Left    benign  . BREAST LUMPECTOMY     left underarm  . cervical cancer  1973  . CERVICAL CONIZATION W/BX    . COLONOSCOPY  12/04   int. hemorrhoids  .  DEXA  03/2002   oseopenia  . DEXA  07/2004   decreased BMD, osteopenia  . DEXA  02/2007   Osteopenia  . DEXA  9/10   Osteopenia slightly worse  . felon I&D  12/2010   Dr. Fredna Dow, I&D in OR (L index finger)  . FINGER SURGERY    . KNEE SURGERY     right  . ROBOTIC ASSISTED TOTAL HYSTERECTOMY WITH BILATERAL SALPINGO OOPHERECTOMY  10/03/2013   Robotic-assisted total laparoscopic hysterectomy, bilateral salpingo-oophorectomy, bilateral pelvic and para-aortic lymphadenectomy with sentinel lymph node dissection  . TONSILECTOMY, ADENOIDECTOMY, BILATERAL MYRINGOTOMY AND TUBES    . TUBAL LIGATION     Social History   Tobacco Use  . Smoking status: Former Smoker    Last attempt to quit: 07/25/1963    Years since  quitting: 54.8  . Smokeless tobacco: Never Used  Substance Use Topics  . Alcohol use: Yes    Alcohol/week: 1.0 standard drinks    Types: 1 Glasses of wine per week    Comment: 1/2 glass of wine a day  . Drug use: No   Family History  Problem Relation Age of Onset  . Multiple myeloma Father   . Coronary artery disease Father   . Transient ischemic attack Mother   . Multiple sclerosis Brother   . Lupus Maternal Aunt   . Breast cancer Maternal Aunt        dx 83s; deceased  . Cancer Maternal Grandfather        GI cancer or pancreatic; deceased 17  . Ovarian cancer Maternal Aunt        dx 44s; deceased 79s  . Colon cancer Neg Hx    Allergies  Allergen Reactions  . Bactrim [Sulfamethoxazole-Trimethoprim] Rash  . Benadryl [Diphenhydramine Hcl (Sleep)] Rash and Anxiety    Fever, body ache  . Oxycodone-Acetaminophen Anxiety  . Sulfa Antibiotics Rash   Current Outpatient Medications on File Prior to Visit  Medication Sig Dispense Refill  . alendronate (FOSAMAX) 70 MG tablet TAKE 1 TABLET BY MOUTH 30 MINUTES BEFOREBREAKFAST ONCE A WEEK WITH ATLEAST 8 OZ. OF WATER. 12 tablet 1  . ALPRAZolam (XANAX) 0.5 MG tablet Take 1 tablet (0.5 mg total) by mouth 2 (two) times daily as needed for anxiety or sleep. 15 tablet 0  . Ascorbic Acid (VITAMIN C PO) Take 1 tablet by mouth daily.    Marland Kitchen CALCIUM PO Take 1,000 mg by mouth 2 (two) times daily.     . cholecalciferol (VITAMIN D) 400 UNITS TABS Take 400 Units by mouth daily.     Marland Kitchen co-enzyme Q-10 30 MG capsule Take 30 mg by mouth daily.     . fish oil-omega-3 fatty acids 1000 MG capsule Take by mouth daily.     Marland Kitchen gabapentin (NEURONTIN) 100 MG capsule TAKE 1 CAPSULE BY MOUTH THREE TIMES A DAY 270 capsule 3  . ibuprofen (ADVIL,MOTRIN) 200 MG tablet Take 200 mg by mouth as needed for pain. Reported on 12/15/2015    . L-Methylfolate 7.5 MG TABS Take 1 tablet (7.5 mg total) by mouth daily. 30 tablet 11  . Lactobacillus (CVS PROBIOTIC ACIDOPHILUS) 10 MG  CAPS Take 1 capsule by mouth daily.     . magnesium 30 MG tablet Take 30 mg by mouth daily. Reported on 12/15/2015    . Multiple Vitamin (MULTIVITAMIN) tablet Take 1 tablet by mouth daily.     No current facility-administered medications on file prior to visit.     Review of Systems  Constitutional: Positive for fatigue. Negative for activity change, appetite change, fever and unexpected weight change.  HENT: Positive for congestion, postnasal drip and rhinorrhea. Negative for ear discharge, ear pain, sinus pressure, sinus pain and sore throat.   Eyes: Negative for pain, redness and visual disturbance.  Respiratory: Positive for cough. Negative for shortness of breath and wheezing.   Cardiovascular: Negative for chest pain and palpitations.  Gastrointestinal: Positive for diarrhea. Negative for abdominal pain, blood in stool, constipation and vomiting.       Bloating One flare of hemorroids  occ indigestion   Endocrine: Negative for polydipsia and polyuria.  Genitourinary: Negative for dysuria, frequency and urgency.  Musculoskeletal: Negative for arthralgias, back pain and myalgias.  Skin: Negative for pallor and rash.  Allergic/Immunologic: Negative for environmental allergies.  Neurological: Negative for dizziness, syncope and headaches.  Hematological: Negative for adenopathy. Does not bruise/bleed easily.  Psychiatric/Behavioral: Positive for sleep disturbance. Negative for decreased concentration and dysphoric mood. The patient is nervous/anxious.        Stressors        Objective:   Physical Exam  Constitutional: She appears well-developed and well-nourished.  Non-toxic appearance. She does not appear ill. No distress.  Well appearing   HENT:  Head: Normocephalic and atraumatic.  Nose: Nose normal.  Mouth/Throat: Oropharynx is clear and moist. No oropharyngeal exudate.  Nares are injected and congested  Clear pnd  Eyes: Pupils are equal, round, and reactive to light.  Conjunctivae and EOM are normal.  Neck: Normal range of motion. Neck supple.  Cardiovascular: Normal rate, regular rhythm and normal heart sounds.  Pulmonary/Chest: Effort normal and breath sounds normal. No stridor. No respiratory distress. She has no wheezes. She has no rales.  Good air exch  Abdominal: Soft. Bowel sounds are normal. She exhibits no distension and no mass. There is no tenderness. There is no rebound and no guarding.  Mild bloating  No M or HSM    Musculoskeletal: She exhibits no edema.  Lymphadenopathy:    She has no cervical adenopathy.  Neurological: She is alert. No cranial nerve deficit.  Skin: Skin is warm and dry. No rash noted.  Psychiatric: Her mood appears anxious.  Mildly anxious and fatigued  Talks freely re: stressors and symptoms           Assessment & Plan:   Problem List Items Addressed This Visit      Musculoskeletal and Integument   Osteoporosis    Finished course of fosamax        Other   Dyspepsia    Recommended pepcid otc prn since zantac is off the market currently  Uses prn  Update if not effective      Stress reaction    Busy lifestyle/ deadlines for new novel/work and travel Reviewed stressors/ coping techniques/symptoms/ support sources/ tx options and side effects in detail today Plans to f/u with counselor  Uses xanax very infrequently for anx symptoms  Disc sleep hygiene Recommended meditation  This may be worsening her GI symptoms       Viral syndrome - Primary    With upper resp symptoms-improving  Some bloating and diarrhea (? This or from stress)  Disc hydration  Healthy diet/probiotics  Update if not starting to improve in a week or if worsening

## 2018-06-02 NOTE — Assessment & Plan Note (Signed)
Finished course of fosamax

## 2018-06-02 NOTE — Assessment & Plan Note (Signed)
Busy lifestyle/ deadlines for new novel/work and travel Reviewed stressors/ coping techniques/symptoms/ support sources/ tx options and side effects in detail today Plans to f/u with counselor  Uses xanax very infrequently for anx symptoms  Disc sleep hygiene Recommended meditation  This may be worsening her GI symptoms

## 2018-06-02 NOTE — Assessment & Plan Note (Signed)
Recommended pepcid otc prn since zantac is off the market currently  Uses prn  Update if not effective

## 2018-06-02 NOTE — Assessment & Plan Note (Signed)
With upper resp symptoms-improving  Some bloating and diarrhea (? This or from stress)  Disc hydration  Healthy diet/probiotics  Update if not starting to improve in a week or if worsening

## 2018-06-04 ENCOUNTER — Other Ambulatory Visit (HOSPITAL_COMMUNITY)
Admission: RE | Admit: 2018-06-04 | Discharge: 2018-06-04 | Disposition: A | Discharge status: Home / Self Care | Payer: Medicare Other | Source: Ambulatory Visit | Attending: Gynecology | Admitting: Gynecology

## 2018-06-04 ENCOUNTER — Encounter: Payer: Self-pay | Admitting: Gynecology

## 2018-06-04 ENCOUNTER — Inpatient Hospital Stay: Payer: Medicare Other | Attending: Gynecology | Admitting: Gynecology

## 2018-06-04 VITALS — BP 124/64 | HR 66 | Temp 98.2°F | Resp 20 | Ht 62.5 in | Wt 104.0 lb

## 2018-06-04 DIAGNOSIS — Z87891 Personal history of nicotine dependence: Secondary | ICD-10-CM | POA: Diagnosis not present

## 2018-06-04 DIAGNOSIS — Z9221 Personal history of antineoplastic chemotherapy: Secondary | ICD-10-CM

## 2018-06-04 DIAGNOSIS — Z923 Personal history of irradiation: Secondary | ICD-10-CM

## 2018-06-04 DIAGNOSIS — Z90710 Acquired absence of both cervix and uterus: Secondary | ICD-10-CM

## 2018-06-04 DIAGNOSIS — C541 Malignant neoplasm of endometrium: Secondary | ICD-10-CM | POA: Insufficient documentation

## 2018-06-04 DIAGNOSIS — Z90722 Acquired absence of ovaries, bilateral: Secondary | ICD-10-CM | POA: Insufficient documentation

## 2018-06-04 NOTE — Progress Notes (Signed)
Dictation #1 [id number]  [id number] Consult Note: Gyn-Onc   Veronica Ward 72 y.o. female  Chief Complaint  Patient presents with  . Endometrial cancer [facility])    Assessment : Endometrial carcinoma (papillary serous) stage I a (March 2015). Status post 6 cycles of carboplatin Taxol chemotherapy and vaginal vault brachytherapy. Patient is clinically free of disease.  Plan:  She will see Dr, Sondra Come in 42month.  Thereafter we will release her back to her primary care physician for follow-up.  Guidelines would recommend that she does not need to continue to have Pap smears.  Interval history: The patient returns today for continuing follow-up of endometrial carcinoma. Since her last visit with gynecologic oncology she's been seen by Dr. KSondra Come Today we she reports she's having no problems and her health is been good. She is up-to-date with mammograms.. She specifically denies any GI or GU symptoms no pelvic pain pressure vaginal bleeding or discharge.  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  HPI: 72year old white female initially seen in consultation request of Dr. PMora Bellmanregarding management of a newly diagnosed endometrial cancer. Approximately 3 weeks ago the patient developed some vaginal spotting. She was seen by Dr. cElly Modenaon February 25. An endometrial biopsy was obtained showing a grade 2 endometrial carcinoma. The patient is also had an ultrasound of the pelvis revealing a uterus measuring 7 x 3 x 3.7 cm. The endometrial stripe was 7 mm.  [name] She has no other gynecologic history.   Patient underwent robotic hysterectomy bilateral salping[facility]oophorect[facility]. Final pathology showed a stage I a  papillary serous carcinoma of the endometrium. Adjuvant therapy with carboplatin and Taxol and vaginal vault brachytherapy was given completed in August 2015.   Review of Systems:10 point review of systems is negative except as noted in interval history.   Vitals: Blood pressure 124/64, pulse 66, temperature 98.2 F (36.8 C), temperature source Oral, resp. rate 20, height 5' 2.5" (1.588 m), weight 104 lb (47.2 kg), last menstrual period 09/17/2000, SpO2 100 %.  Physical Exam: General : The patient is a healthy woman in no acute distress.  HEENT: normocephalic, extraoccular movements normal; neck is supple without thyromegally  Lynphnodes: Supraclavicular and inguinal nodes not enlarged  Abdomen: Soft, non-tender, no ascites, no organomegally, no masses, no hernias  Pelvic:  EGBUS: Normal female  Vagina: Normal, no lesions, atrophic   Urethra and Bladder: Normal, non-tender  Cervix: Surgically absent Uterus: Surgically absent Bi-manual examination: Non-tender; no adenxal masses or nodularity  Rectal: normal sphincter tone, no masses, no blood  Lower extremities: No edema or varicosities. Normal range of motion      Allergies  Allergen Reactions  . Bactrim [Sulfamethoxazole-Trimethoprim] Rash  . Benadryl [Diphenhydramine Hcl (Sleep)] Rash and Anxiety    Fever, body ache  . Oxycodone-Acetaminophen Anxiety  . Sulfa Antibiotics Rash    Past Medical History:  Diagnosis Date  . Anemia   . Anxiety   . Ashkenazi Jewish ancestry   . Diffuse cystic mastopathy   . Disorder of bone and cartilage, unspecified   . Dysautonomia (HCC)    mild  . Endometrial cancer ([place]   . Headache(784.0)   . History of shingles   . Hx of basal cell  carcinoma    face  . Osteoporosis   . Other malaise and fatigue   . Other psoriasis   . Other seborrheic keratosis   . Pain in limb   . Predominant disturbance of emotions   . Radiation 01/06/14, 12/23/13, 12/16/13, 12/09/13   HDR brachytherapy  .  Sleep disturbance, unspecified   . Unspecified hemorrhoids without mention of complication   . Urticaria, unspecified     Past Surgical History:  Procedure Laterality Date  . AXILLARY LYMPH NODE DISSECTION     left  . birthmark removal  childhood  . BREAST BIOPSY Left 2001   benign  . BREAST EXCISIONAL BIOPSY Left    benign  . BREAST LUMPECTOMY     left underarm  . cervical cancer  1973  . CERVICAL CONIZATION W/BX    . COLONOSCOPY  12/04   int. hemorrhoids  . DEXA  03/2002   oseopenia  . DEXA  07/2004   decreased BMD, osteopenia  . DEXA  02/2007   Osteopenia  . DEXA  9/10   Osteopenia slightly worse  . felon I&D  12/2010   Dr. Fredna Dow, I&D in OR (L index finger)  . FINGER SURGERY    . KNEE SURGERY     right  . ROBOTIC ASSISTED TOTAL HYSTERECTOMY WITH BILATERAL SALPINGO OOPHERECTOMY  10/03/2013   Robotic-assisted total laparoscopic hysterectomy, bilateral salpingo-oophorectomy, bilateral pelvic and para-aortic lymphadenectomy with sentinel lymph node dissection  . TONSILECTOMY, ADENOIDECTOMY, BILATERAL MYRINGOTOMY AND TUBES    . TUBAL LIGATION      Current Outpatient Medications  Medication Sig Dispense Refill  . ALPRAZolam (XANAX) 0.5 MG tablet Take 1 tablet (0.5 mg total) by mouth 2 (two) times daily as needed for anxiety or sleep. 15 tablet 0  . Ascorbic Acid (VITAMIN C PO) Take 1 tablet by mouth daily.    . cholecalciferol (VITAMIN D) 400 UNITS TABS Take 400 Units by mouth daily.     Marland Kitchen co-enzyme Q-10 30 MG capsule Take 30 mg by mouth daily.     . fish oil-omega-3 fatty acids 1000 MG capsule Take by mouth daily.     Marland Kitchen gabapentin (NEURONTIN) 100 MG capsule TAKE 1 CAPSULE BY MOUTH THREE TIMES A DAY 270 capsule 3  . ibuprofen (ADVIL,MOTRIN) 200 MG tablet Take 200 mg by mouth as needed for pain. Reported on 12/15/2015    . L-Methylfolate 7.5 MG TABS Take 1 tablet (7.5 mg total) by mouth daily. 30 tablet 11  . Lactobacillus (CVS PROBIOTIC ACIDOPHILUS) 10 MG CAPS Take 1 capsule  by mouth daily.     . Multiple Minerals-Vitamins (CALCIUM-MAGNESIUM-ZINC-D3 PO) Take 2 capsules by mouth daily.    . Multiple Vitamin (MULTIVITAMIN) tablet Take 1 tablet by mouth daily.     No current facility-administered medications for this visit.     Social History   Socioeconomic History  . Marital status: Married    Spouse name: Not on file  . Number of children: 3  . Years of education: Not on file  . Highest education level: Not on file  Occupational History  . Occupation: Professor  Social Needs  . Financial resource strain: Not on file  . Food insecurity:    Worry: Not on file    Inability: Not on file  . Transportation needs:    Medical: Not on file    Non-medical: Not on file  Tobacco Use  . Smoking status: Former Smoker    Last attempt to quit: 07/25/1963    Years  since quitting: 54.8  . Smokeless tobacco: Never Used  Substance and Sexual Activity  . Alcohol use: Yes    Alcohol/week: 1.0 standard drinks    Types: 1 Glasses of wine per week    Comment: 1/2 glass of wine a day  . Drug use: No  . Sexual activity: Yes    Birth control/protection: Post-menopausal  Lifestyle  . Physical activity:    Days per week: Not on file    Minutes per session: Not on file  . Stress: Not on file  Relationships  . Social connections:    Talks on phone: Not on file    Gets together: Not on file    Attends religious service: Not on file    Active member of club or organization: Not on file    Attends meetings of clubs or organizations: Not on file    Relationship status: Not on file  . Intimate partner violence:    Fear of current or ex partner: Not on file    Emotionally abused: Not on file    Physically abused: Not on file    Forced sexual activity: Not on file  Other Topics Concern  . Not on file  Social History Narrative   History professor      Likes to dance      Is a walker and does floor exercises      Caffeine: 2 cups daily    Family History   Problem Relation Age of Onset  . Multiple myeloma Father   . Coronary artery disease Father   . Transient ischemic attack Mother   . Multiple sclerosis Brother   . Lupus Maternal Aunt   . Breast cancer Maternal Aunt        dx 58s; deceased  . Cancer Maternal Grandfather        GI cancer or pancreatic; deceased 6  . Ovarian cancer Maternal Aunt        dx 74s; deceased 27s  . Colon cancer Neg Hx       Marti Sleigh, MD 06/04/2018, 12:10 PM         Consult Note: Gyn-Onc

## 2018-06-04 NOTE — Patient Instructions (Signed)
We will call you with the results of the Pap Smear. You are to follow up with your PCP.

## 2018-06-06 LAB — CYTOLOGY - PAP

## 2018-06-11 ENCOUNTER — Ambulatory Visit: Payer: Medicare Other | Admitting: Psychology

## 2018-06-11 DIAGNOSIS — F4323 Adjustment disorder with mixed anxiety and depressed mood: Secondary | ICD-10-CM | POA: Diagnosis not present

## 2018-06-24 ENCOUNTER — Ambulatory Visit: Payer: Medicare Other | Admitting: Family Medicine

## 2018-06-24 ENCOUNTER — Encounter: Payer: Self-pay | Admitting: Family Medicine

## 2018-06-24 VITALS — BP 122/66 | HR 70 | Temp 98.8°F | Ht 62.5 in | Wt 105.0 lb

## 2018-06-24 DIAGNOSIS — J069 Acute upper respiratory infection, unspecified: Secondary | ICD-10-CM | POA: Diagnosis not present

## 2018-06-24 DIAGNOSIS — B9789 Other viral agents as the cause of diseases classified elsewhere: Secondary | ICD-10-CM | POA: Diagnosis not present

## 2018-06-24 MED ORDER — BENZONATATE 200 MG PO CAPS: 200.0000 mg | ORAL_CAPSULE | Freq: Three times a day (TID) | ORAL | 1 refills | Status: DC | PRN

## 2018-06-24 MED ORDER — PROMETHAZINE-DM 6.25-15 MG/5ML PO SYRP: 5.0000 mL | ORAL_SOLUTION | Freq: Every evening | ORAL | 0 refills | Status: DC | PRN

## 2018-06-24 NOTE — Assessment & Plan Note (Signed)
With reassuring exam  Malaise/fatigue and bothersome cough Px tessalon and prometh DM (caution of sedation) for cough  Disc symptomatic care - see instructions on AVS  Handout given Update if not starting to improve in a week or if worsening  (esp if sinus pain /purulent d/c or prod cough and fever)

## 2018-06-24 NOTE — Patient Instructions (Addendum)
Drink lots of fluids and rest  Try tessalon for cough  Also prometh-DM for night time cough (caution of sedation)  Tylenol for headache or chills   Update if not starting to improve in a week or if worsening   Watch for worse sinus pain/ productive worse cough or fever and let me know

## 2018-06-24 NOTE — Progress Notes (Signed)
Subjective:    Patient ID: Veronica Ward, female    DOB: Feb 11, 1946, 72 y.o.   MRN: [id number]  HPI Here with uri symptoms     Wt Readings from Last 3 Encounters:  06/24/18 105 lb (47.6 kg)  06/04/18 104 lb (47.2 kg)  05/31/18 104 lb 4 oz (47.3 kg)   18.90 kg/m   Symptoms started 8 days ago    No fever  Had some chills  Very tired  Woke up at 3:30 =cough/ congestion and miserable and sinus pain  (could not go back to sleep)  Stayed in bed sat/sunday Hoarse voice  Eyes burn  Headache  Throat feels full /not painful  Non productive cough   Gets worse in the evenings   OTC: emergen-C  Patient Active Problem List   Diagnosis Date Noted  . Viral URI with cough 06/24/2018  . Viral syndrome 05/31/2018  . Stress reaction 05/31/2018  . Headache 12/06/2017  . Dyspepsia 12/06/2017  . Grief 12/06/2017  . Foot fracture 07/10/2016  . Homocysteinemia ([place]) 05/09/2016  . Estrogen deficiency 07/21/2015  . Ashkenazi Jewish ancestry   . Endometrial cancer ([place]) 11/03/2013  . History of uterine cancer 10/28/2013  . CIS (carcinoma in situ of cervix) 09/23/2013  . Encounter for Medicare annual wellness exam 03/12/2013  . Colon cancer screening 03/12/2013  . Routine general medical examination at a health care facility 03/03/2013  . Encounter for medication monitoring 11/23/2010  . SEBORRHEIC KERATOSIS 08/25/2009  . SLEEP DISORDER 12/09/2008  . HEMORRHOIDS 10/12/2008  . Osteoporosis 10/08/2007  . BASAL CELL CARCINOMA, FACE 06/21/2007  . FIBROCYSTIC BREAST DISEASE 06/21/2007  . PSORIASIS 06/21/2007  . URTICARIA 06/21/2007  . BASAL CELL CARCINOMA, FACE 06/21/2007   Past Medical History:  Diagnosis Date  . Anemia   . Anxiety   . Ashkenazi Jewish ancestry   . Diffuse cystic mastopathy   . Disorder of bone and cartilage, unspecified   . Dysautonomia (HCC)    mild  . Endometrial cancer ([place])   . Headache(784.0)   . History of shingles   . Hx of basal cell carcinoma      face  . Osteoporosis   . Other malaise and fatigue   . Other psoriasis   . Other seborrheic keratosis   . Pain in limb   . Predominant disturbance of emotions   . Radiation 01/06/14, 12/23/13, 12/16/13, 12/09/13   HDR brachytherapy  . Sleep disturbance, unspecified   . Unspecified hemorrhoids without mention of complication   . Urticaria, unspecified    Past Surgical History:  Procedure Laterality Date  . AXILLARY LYMPH NODE DISSECTION     left  . birthmark removal  childhood  . BREAST BIOPSY Left 2001   benign  . BREAST EXCISIONAL BIOPSY Left    benign  . BREAST LUMPECTOMY     left underarm  . cervical cancer  1973  . CERVICAL CONIZATION W/BX    . COLONOSCOPY  12/04   int. hemorrhoids  . DEXA  03/2002   oseopenia  . DEXA  07/2004   decreased BMD, osteopenia  . DEXA  02/2007   Osteopenia  . DEXA  9/10   Osteopenia slightly worse  . felon I&D  12/2010   Dr. Fredna Dow, I&D in OR (L index finger)  . FINGER SURGERY    . KNEE SURGERY     right  . ROBOTIC ASSISTED TOTAL HYSTERECTOMY WITH BILATERAL SALPINGO OOPHERECTOMY  10/03/2013   Robotic-assisted total laparoscopic hysterectomy, bilateral salpingo-oophorectomy, bilateral  pelvic and para-aortic lymphadenectomy with sentinel lymph node dissection  . TONSILECTOMY, ADENOIDECTOMY, BILATERAL MYRINGOTOMY AND TUBES    . TUBAL LIGATION     Social History   Tobacco Use  . Smoking status: Former Smoker    Last attempt to quit: 07/25/1963    Years since quitting: 54.9  . Smokeless tobacco: Never Used  Substance Use Topics  . Alcohol use: Yes    Alcohol/week: 1.0 standard drinks    Types: 1 Glasses of wine per week    Comment: 1/2 glass of wine a day  . Drug use: No   Family History  Problem Relation Age of Onset  . Multiple myeloma Father   . Coronary artery disease Father   . Transient ischemic attack Mother   . Multiple sclerosis Brother   . Lupus Maternal Aunt   . Breast cancer Maternal Aunt        dx 78s; deceased  .  Cancer Maternal Grandfather        GI cancer or pancreatic; deceased 24  . Ovarian cancer Maternal Aunt        dx 53s; deceased 36s  . Colon cancer Neg Hx    Allergies  Allergen Reactions  . Bactrim [Sulfamethoxazole-Trimethoprim] Rash  . Benadryl [Diphenhydramine Hcl (Sleep)] Rash and Anxiety    Fever, body ache  . Oxycodone-Acetaminophen Anxiety  . Sulfa Antibiotics Rash   Current Outpatient Medications on File Prior to Visit  Medication Sig Dispense Refill  . ALPRAZolam (XANAX) 0.5 MG tablet Take 1 tablet (0.5 mg total) by mouth 2 (two) times daily as needed for anxiety or sleep. 15 tablet 0  . Ascorbic Acid (VITAMIN C PO) Take 1 tablet by mouth daily.    . cholecalciferol (VITAMIN D) 400 UNITS TABS Take 400 Units by mouth daily.     Marland Kitchen co-enzyme Q-10 30 MG capsule Take 30 mg by mouth daily.     . fish oil-omega-3 fatty acids 1000 MG capsule Take by mouth daily.     Marland Kitchen gabapentin (NEURONTIN) 100 MG capsule TAKE 1 CAPSULE BY MOUTH THREE TIMES A DAY 270 capsule 3  . ibuprofen (ADVIL,MOTRIN) 200 MG tablet Take 200 mg by mouth as needed for pain. Reported on 12/15/2015    . L-Methylfolate 7.5 MG TABS Take 1 tablet (7.5 mg total) by mouth daily. 30 tablet 11  . Lactobacillus (CVS PROBIOTIC ACIDOPHILUS) 10 MG CAPS Take 1 capsule by mouth daily.     . Multiple Minerals-Vitamins (CALCIUM-MAGNESIUM-ZINC-D3 PO) Take 2 capsules by mouth daily.    . Multiple Vitamin (MULTIVITAMIN) tablet Take 1 tablet by mouth daily.     No current facility-administered medications on file prior to visit.     Review of Systems  Constitutional: Positive for appetite change, chills and fatigue. Negative for fever.  HENT: Positive for congestion, postnasal drip, rhinorrhea, sinus pressure, sneezing and sore throat. Negative for ear pain.   Eyes: Negative for pain and discharge.  Respiratory: Positive for cough. Negative for shortness of breath, wheezing and stridor.   Cardiovascular: Negative for chest pain.    Gastrointestinal: Negative for diarrhea, nausea and vomiting.  Genitourinary: Negative for frequency, hematuria and urgency.  Musculoskeletal: Negative for arthralgias and myalgias.  Skin: Negative for rash.  Neurological: Positive for headaches. Negative for dizziness, weakness and light-headedness.  Psychiatric/Behavioral: Negative for confusion and dysphoric mood.       Objective:   Physical Exam  Constitutional: She appears well-developed and well-nourished. No distress.  Well but fatigued appearing  HENT:  Head: Normocephalic and atraumatic.  Right Ear: External ear normal.  Left Ear: External ear normal.  Mouth/Throat: Oropharynx is clear and moist.  Nares are injected and congested  No sinus tenderness Clear rhinorrhea and post nasal drip   Eyes: Pupils are equal, round, and reactive to light. Conjunctivae and EOM are normal. Right eye exhibits no discharge. Left eye exhibits no discharge.  Neck: Normal range of motion. Neck supple.  Cardiovascular: Normal rate and normal heart sounds.  Pulmonary/Chest: Effort normal and breath sounds normal. No stridor. No respiratory distress. She has no wheezes. She has no rales. She exhibits no tenderness.  Good air exch  No rales/rhonchi or wheeze   Lymphadenopathy:    She has no cervical adenopathy.  Neurological: She is alert.  Skin: Skin is warm and dry. No rash noted.  Psychiatric: She has a normal mood and affect.          Assessment & Plan:   Problem List Items Addressed This Visit      Respiratory   Viral URI with cough - Primary    With reassuring exam  Malaise/fatigue and bothersome cough Px tessalon and prometh DM (caution of sedation) for cough  Disc symptomatic care - see instructions on AVS  Handout given Update if not starting to improve in a week or if worsening  (esp if sinus pain /purulent d/c or prod cough and fever)

## 2018-06-25 ENCOUNTER — Ambulatory Visit: Payer: Medicare Other | Admitting: Psychology

## 2018-07-09 ENCOUNTER — Ambulatory Visit (INDEPENDENT_AMBULATORY_CARE_PROVIDER_SITE_OTHER): Payer: Medicare Other | Admitting: Psychology

## 2018-07-09 DIAGNOSIS — F4323 Adjustment disorder with mixed anxiety and depressed mood: Secondary | ICD-10-CM

## 2018-07-23 ENCOUNTER — Ambulatory Visit (INDEPENDENT_AMBULATORY_CARE_PROVIDER_SITE_OTHER): Payer: Medicare Other | Admitting: Psychology

## 2018-07-23 DIAGNOSIS — F4323 Adjustment disorder with mixed anxiety and depressed mood: Secondary | ICD-10-CM

## 2018-07-25 ENCOUNTER — Other Ambulatory Visit: Payer: Self-pay | Admitting: Family Medicine

## 2018-07-25 DIAGNOSIS — Z1231 Encounter for screening mammogram for malignant neoplasm of breast: Secondary | ICD-10-CM

## 2018-08-06 ENCOUNTER — Ambulatory Visit: Payer: Medicare Other | Admitting: Psychology

## 2018-08-06 DIAGNOSIS — F4323 Adjustment disorder with mixed anxiety and depressed mood: Secondary | ICD-10-CM | POA: Diagnosis not present

## 2018-08-20 ENCOUNTER — Ambulatory Visit: Payer: Medicare Other | Admitting: Psychology

## 2018-08-22 ENCOUNTER — Ambulatory Visit: Payer: Medicare Other | Admitting: Psychology

## 2018-08-22 ENCOUNTER — Ambulatory Visit
Admission: RE | Admit: 2018-08-22 | Discharge: 2018-08-22 | Disposition: A | Discharge status: Home / Self Care | Payer: Medicare Other | Source: Ambulatory Visit | Attending: Family Medicine | Admitting: Family Medicine

## 2018-08-22 DIAGNOSIS — Z1231 Encounter for screening mammogram for malignant neoplasm of breast: Secondary | ICD-10-CM

## 2018-08-23 ENCOUNTER — Telehealth: Payer: Self-pay | Admitting: *Deleted

## 2018-08-23 NOTE — Telephone Encounter (Signed)
Patient left a voicemail stating that she has had difficulty sleeping for a while. Patient stated that Dr. Rexene Edison that was at the office recommended that she try Remfresh 0.2 mg and she took it for 4 nights, but it did not keep her asleep. Patient stated that she had a headache, but she has headaches anyway and not sure if the medication may have caused it. Patient wanted Dr. Marliss Coots input about this and if she thinks that she should go up to the regular dose of 0.5 mg nightly?

## 2018-08-25 NOTE — Telephone Encounter (Signed)
Headache is listed as a side effect - so it is hard to say (if she definitely thinks headache is worse on the medication it may be a cause)  If she does try the 5 mg and headaches worsen-then stop it

## 2018-08-26 NOTE — Telephone Encounter (Signed)
Pt notified of Dr. Marliss Coots comments pt said she did stop med for a few days and she didn't have a HA so she thinks it's the med pt said she is going to take it one more time to see if her HA comes back and if it does she will not take the med anymore

## 2018-09-03 ENCOUNTER — Ambulatory Visit: Payer: Medicare Other | Admitting: Psychology

## 2018-09-03 DIAGNOSIS — F4323 Adjustment disorder with mixed anxiety and depressed mood: Secondary | ICD-10-CM

## 2018-09-17 ENCOUNTER — Ambulatory Visit: Payer: Medicare Other | Admitting: Psychology

## 2018-09-17 DIAGNOSIS — F4323 Adjustment disorder with mixed anxiety and depressed mood: Secondary | ICD-10-CM

## 2018-09-19 ENCOUNTER — Encounter: Payer: Self-pay | Admitting: Family Medicine

## 2018-09-19 ENCOUNTER — Ambulatory Visit: Payer: Medicare Other | Admitting: Family Medicine

## 2018-09-19 ENCOUNTER — Ambulatory Visit (INDEPENDENT_AMBULATORY_CARE_PROVIDER_SITE_OTHER)
Admission: RE | Admit: 2018-09-19 | Discharge: 2018-09-19 | Disposition: A | Discharge status: Home / Self Care | Payer: Medicare Other | Source: Ambulatory Visit | Attending: Family Medicine | Admitting: Family Medicine

## 2018-09-19 VITALS — BP 126/72 | HR 63 | Temp 98.1°F | Ht 62.5 in | Wt 105.6 lb

## 2018-09-19 DIAGNOSIS — M25551 Pain in right hip: Secondary | ICD-10-CM

## 2018-09-19 DIAGNOSIS — C541 Malignant neoplasm of endometrium: Secondary | ICD-10-CM | POA: Diagnosis not present

## 2018-09-19 DIAGNOSIS — R7983 Abnormal findings of blood amino-acid level: Secondary | ICD-10-CM

## 2018-09-19 DIAGNOSIS — E7219 Other disorders of sulfur-bearing amino-acid metabolism: Secondary | ICD-10-CM | POA: Diagnosis not present

## 2018-09-19 DIAGNOSIS — M25552 Pain in left hip: Secondary | ICD-10-CM | POA: Diagnosis not present

## 2018-09-19 MED ORDER — METHOCARBAMOL 500 MG PO TABS: 500.0000 mg | ORAL_TABLET | Freq: Every evening | ORAL | 0 refills | Status: AC | PRN

## 2018-09-19 NOTE — Assessment & Plan Note (Signed)
Pt continues her methylfolate tx

## 2018-09-19 NOTE — Assessment & Plan Note (Signed)
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 Poss trochanteric bursitis but exam is not classic for it Recommend heat prn /also magnesium if tolerated for muscle cramps Given px for methocarbamol for pm (muscle relaxer) with caution of sedation  Plan from there

## 2018-09-19 NOTE — Patient Instructions (Addendum)
Some extra magnesium 250 to 500 mg (watch out for diarrhea) may help muscle irritability and cramps   Continue heat and stretching  Baths are ok too (be careful getting in and out)   Let's check xray hips and pelvis today (we will have a reading tomorrow)   This could be hip bursitis  Or some tight muscles

## 2018-09-19 NOTE — Assessment & Plan Note (Signed)
Still in follow up / free of disease at this time

## 2018-09-19 NOTE — Progress Notes (Signed)
Subjective:    Patient ID: Veronica Ward, female    DOB: Sep 18, 1945, 73 y.o.   MRN: [id number]  HPI  Here for R leg and foot cramping  Wt Readings from Last 3 Encounters:  09/19/18 105 lb 9 oz (47.9 kg)  06/24/18 105 lb (47.6 kg)  06/04/18 104 lb (47.2 kg)   19.00 kg/m   In the past every once in a while feels like feet could cramp (but they do not always do it) -often in bed at night   Often gets pain in the back of both legs when sitting in the car  Back of thighs- throbbing that radiates to both lower legs  No symptoms when active (walking/dancing)   Once she felt "almost" numb in the back of legs- worse in the right side   When she did her floor exercises yesterday  Had more pain when stretching R leg (buttock and upper post leg)   Last night when she externally rotated her hip during dance- intense pain and had to stop (even hurt to flex hip) Pain to flex hip to get into the bath last night -ached  Used epsom salts in warm bath  Took advil  Today her R outer hip is achy  No pain in her low back   No swelling or skin change   Patient Active Problem List   Diagnosis Date Noted  . Hip pain, bilateral 09/19/2018  . Viral URI with cough 06/24/2018  . Viral syndrome 05/31/2018  . Stress reaction 05/31/2018  . Headache 12/06/2017  . Dyspepsia 12/06/2017  . Grief 12/06/2017  . Foot fracture 07/10/2016  . Homocysteinemia ([place]) 05/09/2016  . Estrogen deficiency 07/21/2015  . Ashkenazi Jewish ancestry   . Endometrial cancer ([place]) 11/03/2013  . History of uterine cancer 10/28/2013  . CIS (carcinoma in situ of cervix) 09/23/2013  . Encounter for Medicare annual wellness exam 03/12/2013  . Colon cancer screening 03/12/2013  . Routine general medical examination at a health care facility 03/03/2013  . Encounter for medication monitoring 11/23/2010  . SEBORRHEIC KERATOSIS 08/25/2009  . SLEEP DISORDER 12/09/2008  . HEMORRHOIDS 10/12/2008  . Osteoporosis 10/08/2007    . BASAL CELL CARCINOMA, FACE 06/21/2007  . FIBROCYSTIC BREAST DISEASE 06/21/2007  . PSORIASIS 06/21/2007  . URTICARIA 06/21/2007  . BASAL CELL CARCINOMA, FACE 06/21/2007   Past Medical History:  Diagnosis Date  . Anemia   . Anxiety   . Ashkenazi Jewish ancestry   . Diffuse cystic mastopathy   . Disorder of bone and cartilage, unspecified   . Dysautonomia (HCC)    mild  . Endometrial cancer ([place])   . Headache(784.0)   . History of shingles   . Hx of basal cell carcinoma    face  . Osteoporosis   . Other malaise and fatigue   . Other psoriasis   . Other seborrheic keratosis   . Pain in limb   . Predominant disturbance of emotions   . Radiation 01/06/14, 12/23/13, 12/16/13, 12/09/13   HDR brachytherapy  . Sleep disturbance, unspecified   . Unspecified hemorrhoids without mention of complication   . Urticaria, unspecified    Past Surgical History:  Procedure Laterality Date  . AXILLARY LYMPH NODE DISSECTION     left  . birthmark removal  childhood  . BREAST BIOPSY Left 2001   benign  . BREAST EXCISIONAL BIOPSY Left    benign  . BREAST LUMPECTOMY     left underarm  . cervical cancer  1973  .  CERVICAL CONIZATION W/BX    . COLONOSCOPY  12/04   int. hemorrhoids  . DEXA  03/2002   oseopenia  . DEXA  07/2004   decreased BMD, osteopenia  . DEXA  02/2007   Osteopenia  . DEXA  9/10   Osteopenia slightly worse  . felon I&D  12/2010   Dr. Fredna Dow, I&D in OR (L index finger)  . FINGER SURGERY    . KNEE SURGERY     right  . ROBOTIC ASSISTED TOTAL HYSTERECTOMY WITH BILATERAL SALPINGO OOPHERECTOMY  10/03/2013   Robotic-assisted total laparoscopic hysterectomy, bilateral salpingo-oophorectomy, bilateral pelvic and para-aortic lymphadenectomy with sentinel lymph node dissection  . TONSILECTOMY, ADENOIDECTOMY, BILATERAL MYRINGOTOMY AND TUBES    . TUBAL LIGATION     Social History   Tobacco Use  . Smoking status: Former Smoker    Last attempt to quit: 07/25/1963    Years since  quitting: 55.1  . Smokeless tobacco: Never Used  Substance Use Topics  . Alcohol use: Yes    Alcohol/week: 1.0 standard drinks    Types: 1 Glasses of wine per week    Comment: 1/2 glass of wine a day  . Drug use: No   Family History  Problem Relation Age of Onset  . Multiple myeloma Father   . Coronary artery disease Father   . Transient ischemic attack Mother   . Multiple sclerosis Brother   . Lupus Maternal Aunt   . Breast cancer Maternal Aunt        dx 68s; deceased  . Cancer Maternal Grandfather        GI cancer or pancreatic; deceased 7  . Ovarian cancer Maternal Aunt        dx 70s; deceased 42s  . Colon cancer Neg Hx    Allergies  Allergen Reactions  . Bactrim [Sulfamethoxazole-Trimethoprim] Rash  . Benadryl [Diphenhydramine Hcl (Sleep)] Rash and Anxiety    Fever, body ache  . Oxycodone-Acetaminophen Anxiety  . Sulfa Antibiotics Rash   Current Outpatient Medications on File Prior to Visit  Medication Sig Dispense Refill  . ALPRAZolam (XANAX) 0.5 MG tablet Take 1 tablet (0.5 mg total) by mouth 2 (two) times daily as needed for anxiety or sleep. 15 tablet 0  . Ascorbic Acid (VITAMIN C PO) Take 1 tablet by mouth daily.    . cholecalciferol (VITAMIN D) 400 UNITS TABS Take 400 Units by mouth daily.     Marland Kitchen co-enzyme Q-10 30 MG capsule Take 30 mg by mouth daily.     . fish oil-omega-3 fatty acids 1000 MG capsule Take by mouth daily.     Marland Kitchen gabapentin (NEURONTIN) 100 MG capsule TAKE 1 CAPSULE BY MOUTH THREE TIMES A DAY 270 capsule 3  . ibuprofen (ADVIL,MOTRIN) 200 MG tablet Take 200 mg by mouth as needed for pain. Reported on 12/15/2015    . L-Methylfolate 7.5 MG TABS Take 1 tablet (7.5 mg total) by mouth daily. 30 tablet 11  . Lactobacillus (CVS PROBIOTIC ACIDOPHILUS) 10 MG CAPS Take 1 capsule by mouth daily.     . Multiple Minerals-Vitamins (CALCIUM-MAGNESIUM-ZINC-D3 PO) Take 2 capsules by mouth daily.    . Multiple Vitamin (MULTIVITAMIN) tablet Take 1 tablet by mouth daily.      No current facility-administered medications on file prior to visit.      Review of Systems  Constitutional: Negative for activity change, appetite change, fatigue, fever and unexpected weight change.  HENT: Negative for congestion, ear pain, rhinorrhea, sinus pressure and sore throat.   Eyes:  Negative for pain, redness and visual disturbance.  Respiratory: Negative for cough, shortness of breath and wheezing.   Cardiovascular: Negative for chest pain and palpitations.  Gastrointestinal: Negative for abdominal pain, blood in stool, constipation and diarrhea.  Endocrine: Negative for polydipsia and polyuria.  Genitourinary: Negative for dysuria, frequency and urgency.  Musculoskeletal: Negative for arthralgias, back pain and myalgias.  Skin: Negative for pallor and rash.  Allergic/Immunologic: Negative for environmental allergies.  Neurological: Negative for dizziness, syncope and headaches.  Hematological: Negative for adenopathy. Does not bruise/bleed easily.  Psychiatric/Behavioral: Negative for decreased concentration and dysphoric mood. The patient is not nervous/anxious.        Objective:   Physical Exam Constitutional:      General: She is not in acute distress.    Appearance: Normal appearance. She is normal weight. She is not ill-appearing.  Eyes:     Extraocular Movements: Extraocular movements intact.     Conjunctiva/sclera: Conjunctivae normal.     Pupils: Pupils are equal, round, and reactive to light.  Cardiovascular:     Rate and Rhythm: Normal rate and regular rhythm.     Pulses: Normal pulses.     Heart sounds: Normal heart sounds.  Pulmonary:     Effort: Pulmonary effort is normal. No respiratory distress.     Breath sounds: Normal breath sounds. No wheezing or rales.  Abdominal:     General: Abdomen is flat. Bowel sounds are normal.     Palpations: Abdomen is soft.     Comments: No suprapubic tenderness or fullness    Musculoskeletal:        General:  Tenderness present. No swelling or deformity.     Right lower leg: No edema.     Left lower leg: No edema.     Comments: No LS tenderness with nl rom and good flexibility Nl rom of hips with some discomfort on full internal rotation worse on L  Some limited flexion of R knee  Tight hamstrings with hip flexion  Very mild tenderness over greater trochanter of L leg   No swelling or crepitus   Nl gait  No neuro changes   Skin:    General: Skin is warm and dry.     Coloration: Skin is not pale.     Comments: Dry skin on buttocks  No vesicles   Neurological:     General: No focal deficit present.     Mental Status: She is alert.     Sensory: No sensory deficit.     Coordination: Coordination normal.     Deep Tendon Reflexes: Reflexes normal.  Psychiatric:        Mood and Affect: Mood normal.           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 Poss trochanteric bursitis but exam is not classic for it Recommend heat prn /also magnesium if tolerated for muscle cramps Given px for methocarbamol for pm (muscle relaxer) with caution of sedation  Plan from there       Relevant Orders   DG HIPS BILAT WITH PELVIS 3-4 VIEWS

## 2018-09-20 ENCOUNTER — Telehealth: Payer: Self-pay | Admitting: Family Medicine

## 2018-09-20 NOTE — Telephone Encounter (Signed)
Called pt back she said she had viewed her x-rays on mychart but is still having severe hip and leg pain and wanted to go ahead and set up appt with Dr. Lorelei Pont as Dr. Glori Bickers suggested. appt scheduled for Monday FYI to PCP and Dr. Lorelei Pont

## 2018-09-20 NOTE — Telephone Encounter (Signed)
Pt want to know results of her xray

## 2018-09-21 NOTE — Telephone Encounter (Signed)
I am happy to evaluate

## 2018-09-22 NOTE — Progress Notes (Signed)
Dr. Frederico Hamman T. Taline Nass, MD, [facility] [place], [address] Phone: [phone] Fax: [phone]  09/23/2018  Patient: Veronica Ward, MRN: [id number], DOB: Jul 11, 1946, 73 y.o.  Primary Physician:  Tower, Wynelle Fanny, MD   Chief Complaint  Patient presents with  . Leg Pain    Right side is worse   Subjective:   Veronica Ward is a 73 y.o. very pleasant female patient who presents with the following:  Known patient with a history of endometrial cancer who presents with ongoing hip pain and pain down her R leg, sometimes all the way down to her foot.  When externally rotated her hip during dance, she got a sharp pain. Body mass index is 19.21 kg/m.   FRAX score is 20.3% Risk of hip fracture is 7.2% She has been on 5 years of Fosamax  3 weeks ago, started to have some in the posterior of the posterior of the legs.  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 Worse on a computer and worse with crossing her legs, riding in a car, hurts a lot. Driving a little better. Walking is ok.   Was doing floor exercising and doing modified situps.  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 Pain with flexion and adduction.  No fall or trauma.  Dances for 2 1/2 hours. Could not get in the bath.  No specific injury. Some aching down in her legs.   l adductor, abd, rad  Ibuprofen at night Gabapentin robaxin  Hop? neg  Gabapentin - for ha  Past Medical History, Surgical History, Social History, Family History, Problem List, Medications, and Allergies have been reviewed and updated if relevant.  Patient Active Problem List   Diagnosis Date Noted  . Hip pain, bilateral 09/19/2018  . Stress reaction 05/31/2018  . Headache 12/06/2017  . Dyspepsia 12/06/2017  . Grief 12/06/2017  . Foot fracture 07/10/2016  . Homocysteinemia ([place]) 05/09/2016  . Estrogen deficiency 07/21/2015  . Ashkenazi  Jewish ancestry   . Endometrial cancer ([place]) 11/03/2013  . History of uterine cancer 10/28/2013  . CIS (carcinoma in situ of cervix) 09/23/2013  . Encounter for Medicare annual wellness exam 03/12/2013  . Colon cancer screening 03/12/2013  . Routine general medical examination at a health care facility 03/03/2013  . Encounter for medication monitoring 11/23/2010  . SEBORRHEIC KERATOSIS 08/25/2009  . SLEEP DISORDER 12/09/2008  . HEMORRHOIDS 10/12/2008  . Osteoporosis 10/08/2007  . BASAL CELL CARCINOMA, FACE 06/21/2007  . FIBROCYSTIC BREAST DISEASE 06/21/2007  . PSORIASIS 06/21/2007  . URTICARIA 06/21/2007  . BASAL CELL CARCINOMA, FACE 06/21/2007    Past Medical History:  Diagnosis Date  . Anemia   . Anxiety   . Ashkenazi Jewish ancestry   . Diffuse cystic mastopathy   . Disorder of bone and cartilage, unspecified   . Dysautonomia (HCC)    mild  . Endometrial cancer ([place])   . Headache(784.0)   . History of shingles   . Hx of basal cell carcinoma    face  . Osteoporosis   . Other malaise and fatigue   . Other psoriasis   . Other seborrheic keratosis   . Pain in limb   . Predominant disturbance of emotions   . Radiation 01/06/14, 12/23/13, 12/16/13, 12/09/13   HDR brachytherapy  . Sleep disturbance, unspecified   . Unspecified hemorrhoids without mention of complication   .  Urticaria, unspecified     Past Surgical History:  Procedure Laterality Date  . AXILLARY LYMPH NODE DISSECTION     left  . birthmark removal  childhood  . BREAST BIOPSY Left 2001   benign  . BREAST EXCISIONAL BIOPSY Left    benign  . BREAST LUMPECTOMY     left underarm  . cervical cancer  1973  . CERVICAL CONIZATION W/BX    . COLONOSCOPY  12/04   int. hemorrhoids  . DEXA  03/2002   oseopenia  . DEXA  07/2004   decreased BMD, osteopenia  . DEXA  02/2007   Osteopenia  . DEXA  9/10   Osteopenia slightly worse  . felon I&D  12/2010   Dr. Fredna Dow, I&D in OR (L index finger)  . FINGER SURGERY      . KNEE SURGERY     right  . ROBOTIC ASSISTED TOTAL HYSTERECTOMY WITH BILATERAL SALPINGO OOPHERECTOMY  10/03/2013   Robotic-assisted total laparoscopic hysterectomy, bilateral salpingo-oophorectomy, bilateral pelvic and para-aortic lymphadenectomy with sentinel lymph node dissection  . TONSILECTOMY, ADENOIDECTOMY, BILATERAL MYRINGOTOMY AND TUBES    . TUBAL LIGATION      Social History   Socioeconomic History  . Marital status: Married    Spouse name: Not on file  . Number of children: 3  . Years of education: Not on file  . Highest education level: Not on file  Occupational History  . Occupation: Professor  Social Needs  . Financial resource strain: Not on file  . Food insecurity:    Worry: Not on file    Inability: Not on file  . Transportation needs:    Medical: Not on file    Non-medical: Not on file  Tobacco Use  . Smoking status: Former Smoker    Last attempt to quit: 07/25/1963    Years since quitting: 55.2  . Smokeless tobacco: Never Used  Substance and Sexual Activity  . Alcohol use: Yes    Alcohol/week: 1.0 standard drinks    Types: 1 Glasses of wine per week    Comment: 1/2 glass of wine a day  . Drug use: No  . Sexual activity: Yes    Birth control/protection: Post-menopausal  Lifestyle  . Physical activity:    Days per week: Not on file    Minutes per session: Not on file  . Stress: Not on file  Relationships  . Social connections:    Talks on phone: Not on file    Gets together: Not on file    Attends religious service: Not on file    Active member of club or organization: Not on file    Attends meetings of clubs or organizations: Not on file    Relationship status: Not on file  . Intimate partner violence:    Fear of current or ex partner: Not on file    Emotionally abused: Not on file    Physically abused: Not on file    Forced sexual activity: Not on file  Other Topics Concern  . Not on file  Social History Narrative   History professor       Likes to dance      Is a walker and does floor exercises      Caffeine: 2 cups daily    Family History  Problem Relation Age of Onset  . Multiple myeloma Father   . Coronary artery disease Father   . Transient ischemic attack Mother   . Multiple sclerosis Brother   . Lupus  Maternal Aunt   . Breast cancer Maternal Aunt        dx 22s; deceased  . Cancer Maternal Grandfather        GI cancer or pancreatic; deceased 47  . Ovarian cancer Maternal Aunt        dx 76s; deceased 68s  . Colon cancer Neg Hx     Allergies  Allergen Reactions  . Bactrim [Sulfamethoxazole-Trimethoprim] Rash  . Benadryl [Diphenhydramine Hcl (Sleep)] Rash and Anxiety    Fever, body ache  . Oxycodone-Acetaminophen Anxiety  . Sulfa Antibiotics Rash    Medication list reviewed and updated in full in [place].  GEN: No fevers, chills. Nontoxic. Primarily MSK c/o today. MSK: Detailed in the HPI GI: tolerating PO intake without difficulty Neuro: r radiculopathy without numbness Otherwise the pertinent positives of the ROS are noted above.   Objective:   BP 120/78   Pulse 90   Temp 98.4 F (36.9 C) (Oral)   Ht 5' 2.5" (1.588 m)   Wt 106 lb 12 oz (48.4 kg)   LMP 09/17/2000   BMI 19.21 kg/m    GEN: Well-developed,well-nourished,in no acute distress; alert,appropriate and cooperative throughout examination HEENT: Normocephalic and atraumatic without obvious abnormalities. Ears, externally no deformities PULM: Breathing comfortably in no respiratory distress EXT: No clubbing, cyanosis, or edema PSYCH: Normally interactive. Cooperative during the interview. Pleasant. Friendly and conversant. Not anxious or depressed appearing. Normal, full affect.  Range of motion at  the waist: Flexion: normal Extension: normal Lateral bending: normal Rotation: all normal  No echymosis or edema Rises to examination table with no difficulty Gait: non antalgic  Inspection/Deformity: N Paraspinus  Tenderness: minmal, l5-s1 on r  B Ankle Dorsiflexion (L5,4): 5/5 B Great Toe Dorsiflexion (L5,4): 5/5 Heel Walk (L5): WNL Toe Walk (S1): WNL Rise/Squat (L4): WNL  SENSORY B Medial Foot (L4): WNL B Dorsum (L5): WNL B Lateral (S1): WNL Light Touch: WNL Pinprick: WNL  REFLEXES Knee (L4): 1+ Ankle (S1): 1+  B SLR, seated: neg B SLR, supine: neg B FABER: neg B Reverse FABER: neg B Greater Troch: NT B Log Roll: neg B Stork: NT B Sciatic Notch: NT   HIP EXAM: SIDE: b ROM: Abduction, Flexion, Internal and External range of motion: full Pain with terminal IROM and EROM: no GTB: NT SLR: NEG Knees: No effusion FABER: NT REVERSE FABER: NT, neg Piriformis: NT at direct palpation Str: flexion: 3/5 on r, 4/5 on L abduction: 5/5 adduction: 4/5 b  Single leg hop test is negative    Radiology: Dg Hips Bilat With Pelvis 3-4 Views  Result Date: 09/20/2018 CLINICAL DATA:  Chronic BILATERAL hip pain, RIGHT greater than LEFT. EXAM: DG HIP (WITH OR WITHOUT PELVIS) 3-4V BILAT COMPARISON:  None. FINDINGS: Joint spaces in both hips well preserved. Bone mineral density well-preserved. Included AP pelvis demonstrates degenerative changes in the sacroiliac joints and symphysis pubis. Visualized LOWER lumbar spine unremarkable. IMPRESSION: Normal appearing BILATERAL hip joints. Degenerative changes in the sacroiliac joints and symphysis pubis. Electronically Signed   By: Evangeline Dakin M.D.   On: 09/20/2018 08:11   The radiological images were independently reviewed by myself in the office and results were reviewed with the patient. My independent interpretation of images:   No DJD at hip joints, which are well preserved.  Mild SI joint and symphysis pubis and reviewed this with patient. Electronically Signed  By: Owens Loffler, MD On: 09/24/2018 10:54 AM   Assessment and Plan:   Sciatica, right side  Hip pain, bilateral  Age-related osteoporosis without current pathological  fracture  Difficult to know which came first, but ongoing R sciatica with notable hip flexor weakness r > l, adductor weakness as well.    I do not think this is intraarticular hip pathology.  Work on hip rotator stretches and piriformis stretching with str program  Patient Instructions  Motrin 600 mg recommended up to three times a day  Methocarbomal as needed  Generic Gabapentin Titration Schedule  Generic Gabapentin (generic form of Neurontin) comes in 100 mg tablets or capsules.   You have to titrate your dose slowly to reduce side effects and reduce sedation / sleepiness.    Week               Breakfast  Lunch   Dinner One                 0   0   100 mg Two   146m   0   1066mThree   10051m 100m30m100mg34m you have any problems at any time, drop back to the previous dosing schedule. Continue with this dose for 1 week, and then try to go up the next step again.     Follow-up: 3-4 weeks  Signed,  Patric Vanpelt T. Aniyia Rane, MD   Outpatient Encounter Medications as of 09/23/2018  Medication Sig  . ALPRAZolam (XANAX) 0.5 MG tablet Take 1 tablet (0.5 mg total) by mouth 2 (two) times daily as needed for anxiety or sleep.  . Ascorbic Acid (VITAMIN C PO) Take 1 tablet by mouth daily.  . cholecalciferol (VITAMIN D) 400 UNITS TABS Take 400 Units by mouth daily.   . co-Marland Kitchennzyme Q-10 30 MG capsule Take 30 mg by mouth daily.   . fish oil-omega-3 fatty acids 1000 MG capsule Take by mouth daily.   . gabMarland Kitchenpentin (NEURONTIN) 100 MG capsule TAKE 1 CAPSULE BY MOUTH THREE TIMES A DAY  . ibuprofen (ADVIL,MOTRIN) 200 MG tablet Take 200 mg by mouth as needed for pain. Reported on 12/15/2015  . L-Methylfolate 7.5 MG TABS Take 1 tablet (7.5 mg total) by mouth daily.  . Lactobacillus (CVS PROBIOTIC ACIDOPHILUS) 10 MG CAPS Take 1 capsule by mouth daily.   . Magnesium 250 MG TABS Take 1 tablet by mouth daily.  . methocarbamol (ROBAXIN) 500 MG tablet Take 1 tablet (500 mg total) by mouth at bedtime as  needed for muscle spasms.  . Multiple Minerals-Vitamins (CALCIUM-MAGNESIUM-ZINC-D3 PO) Take 2 capsules by mouth daily.  . Multiple Vitamin (MULTIVITAMIN) tablet Take 1 tablet by mouth daily.   No facility-administered encounter medications on file as of 09/23/2018.

## 2018-09-23 ENCOUNTER — Ambulatory Visit: Payer: Medicare Other | Admitting: Family Medicine

## 2018-09-23 ENCOUNTER — Encounter: Payer: Self-pay | Admitting: Family Medicine

## 2018-09-23 VITALS — BP 120/78 | HR 90 | Temp 98.4°F | Ht 62.5 in | Wt 106.8 lb

## 2018-09-23 DIAGNOSIS — M810 Age-related osteoporosis without current pathological fracture: Secondary | ICD-10-CM | POA: Diagnosis not present

## 2018-09-23 DIAGNOSIS — M25552 Pain in left hip: Secondary | ICD-10-CM | POA: Diagnosis not present

## 2018-09-23 DIAGNOSIS — M5431 Sciatica, right side: Secondary | ICD-10-CM | POA: Diagnosis not present

## 2018-09-23 DIAGNOSIS — M25551 Pain in right hip: Secondary | ICD-10-CM

## 2018-09-23 NOTE — Patient Instructions (Addendum)
Motrin 600 mg recommended up to three times a day  Methocarbomal as needed  Generic Gabapentin Titration Schedule  Generic Gabapentin (generic form of Neurontin) comes in 100 mg tablets or capsules.   You have to titrate your dose slowly to reduce side effects and reduce sedation / sleepiness.    Week               Breakfast  Lunch   Dinner One                 0   0   100 mg Two   100mg    0   100mg  Three   100mg    100mg    100mg   If you have any problems at any time, drop back to the previous dosing schedule. Continue with this dose for 1 week, and then try to go up the next step again.

## 2018-10-01 ENCOUNTER — Ambulatory Visit: Payer: Medicare Other | Admitting: Psychology

## 2018-10-01 DIAGNOSIS — F4323 Adjustment disorder with mixed anxiety and depressed mood: Secondary | ICD-10-CM | POA: Diagnosis not present

## 2018-10-02 ENCOUNTER — Other Ambulatory Visit: Payer: Self-pay | Admitting: Family Medicine

## 2018-10-15 ENCOUNTER — Ambulatory Visit (INDEPENDENT_AMBULATORY_CARE_PROVIDER_SITE_OTHER): Payer: Medicare Other | Admitting: Psychology

## 2018-10-15 DIAGNOSIS — F4323 Adjustment disorder with mixed anxiety and depressed mood: Secondary | ICD-10-CM

## 2018-10-17 ENCOUNTER — Ambulatory Visit: Payer: Self-pay | Admitting: Family Medicine

## 2018-10-24 ENCOUNTER — Other Ambulatory Visit: Payer: Self-pay | Admitting: Family Medicine

## 2018-10-25 NOTE — Telephone Encounter (Signed)
Name of Medication: Xanax  Name of Pharmacy: [facility] or Written Date and Quantity: 03/20/18 #15 tablets with 0 reiflls Last Office Visit and Type: appt with Dr. Lorelei Pont on 09/23/18 Next Office Visit and Type: CPE on 11/11/18

## 2018-10-29 ENCOUNTER — Ambulatory Visit (INDEPENDENT_AMBULATORY_CARE_PROVIDER_SITE_OTHER): Payer: Medicare Other | Admitting: Psychology

## 2018-10-29 DIAGNOSIS — F4323 Adjustment disorder with mixed anxiety and depressed mood: Secondary | ICD-10-CM

## 2018-11-01 ENCOUNTER — Ambulatory Visit: Payer: Self-pay

## 2018-11-03 ENCOUNTER — Telehealth: Payer: Self-pay | Admitting: Family Medicine

## 2018-11-03 DIAGNOSIS — Z0000 Encounter for general adult medical examination without abnormal findings: Secondary | ICD-10-CM

## 2018-11-03 DIAGNOSIS — M810 Age-related osteoporosis without current pathological fracture: Secondary | ICD-10-CM

## 2018-11-03 NOTE — Telephone Encounter (Signed)
-----   Message from Ellamae Sia sent at 10/31/2018  2:47 PM EDT ----- Regarding: Lab orders for Monday, 4.13.20 Lab orders

## 2018-11-04 ENCOUNTER — Telehealth: Payer: Self-pay

## 2018-11-04 ENCOUNTER — Other Ambulatory Visit: Payer: Self-pay

## 2018-11-04 ENCOUNTER — Other Ambulatory Visit: Payer: Medicare Other

## 2018-11-04 ENCOUNTER — Ambulatory Visit: Payer: Medicare Other

## 2018-11-04 NOTE — Telephone Encounter (Signed)
Copied from [place] [id number]. Topic: Quick Communication - Appointment Cancellation >> Nov 01, 2018 10:01 AM Rutherford Nail, NT wrote: Patient called to cancel appointment scheduled for 11/04/2018 (labs and AWV) and 11/11/2018(Visit with Dr Glori Bickers). Patient as not rescheduled their appointment. Patient does not wish to come have labs drawn at this time due to [place] 19 risks. States that she would like to reschedule into June.  Route to department's PEC pool.

## 2018-11-06 ENCOUNTER — Encounter: Payer: Self-pay | Admitting: Family Medicine

## 2018-11-07 ENCOUNTER — Ambulatory Visit: Payer: Self-pay

## 2018-11-11 ENCOUNTER — Ambulatory Visit: Payer: Medicare Other | Admitting: Family Medicine

## 2018-11-12 ENCOUNTER — Ambulatory Visit (INDEPENDENT_AMBULATORY_CARE_PROVIDER_SITE_OTHER): Payer: Medicare Other | Admitting: Psychology

## 2018-11-12 DIAGNOSIS — F4323 Adjustment disorder with mixed anxiety and depressed mood: Secondary | ICD-10-CM | POA: Diagnosis not present

## 2018-11-26 ENCOUNTER — Ambulatory Visit (INDEPENDENT_AMBULATORY_CARE_PROVIDER_SITE_OTHER): Payer: Medicare Other | Admitting: Psychology

## 2018-11-26 DIAGNOSIS — F4323 Adjustment disorder with mixed anxiety and depressed mood: Secondary | ICD-10-CM

## 2018-12-02 ENCOUNTER — Telehealth: Payer: Self-pay

## 2018-12-02 NOTE — Telephone Encounter (Signed)
Returned pt's VM. Pt requesting to reschedule 5/18 appt with Dr. Sondra Come until August. Pt states husband has heart condition and is not taking any chances for exposure to Covid 19. Conveyed to pt that appt could be rescheduled and that pt call would be transferred to scheduler, Enid Derry. Pt without any further questions/concerns. Loma Sousa, RN BSN

## 2018-12-05 ENCOUNTER — Telehealth: Payer: Self-pay

## 2018-12-05 NOTE — Telephone Encounter (Signed)
[facility] Patient Name: Veronica Ward Gender: Female DOB: 06/18/1946 Age: 73 Y 10 M 3 D Return Phone Number: [phone] (Primary), [phone] (Secondary) Address: [place] [address] Client  [facility] Night - Client Client Site [place] [facility] Tower, [name] - MD Contact Type Call Who Is Calling Patient / Member / Family / Caregiver Call Type Triage / Clinical Relationship To Patient Self Return Phone Number [phone] (Secondary) Chief Complaint Flank Pain Reason for Call Symptomatic / Request for [place] states that she fell on the bathroom floor and hit her face on the ceramic tile. Her back and right side hurts terribly. She is icing it.When she moves her arm in a certain way something in her middle back hurts. She can walk doesn't seem to have broken anything Translation No Nurse Assessment Nurse: Windle Guard, RN, Olin Hauser Date/Time (Eastern Time): 12/04/2018 8:47:10 PM Confirm and document reason for call. If symptomatic, describe symptoms. ---Caller states that she fell on the bathroom floor and hit her face on the ceramic tile. States she landed twisted on her right side and back. When she moves her arm in a certain way she has pain in her back. Also has back pain with deep breathing. Has the patient had close contact with a person known or suspected to have the novel coronavirus illness OR traveled / lives in area with major community spread (including international travel) in the last 14 days from the onset of symptoms? * If Asymptomatic, screen for exposure and travel within the last 14 days. ---Not Applicable Does the patient have any new or worsening symptoms? ---Yes Will a triage be completed? ---Yes Related visit to physician within the last 2 weeks? ---No Does the  PT have any chronic conditions? (i.e. diabetes, asthma, this includes High risk factors for pregnancy, etc.) ---No Is this a behavioral health or substance abuse call? ---No Guidelines Guideline Title Affirmed Question Affirmed Notes Nurse Date/Time (Eastern Time) Back Injury Sounds like a serious injury to the triager Windle Guard, RN, Olin Hauser 12/04/2018 8:51:19 PM Disp. Time Eilene Ghazi Time) Disposition Final User PLEASE NOTE: All timestamps contained within this report are represented as [place] Standard Time. CONFIDENTIALTY NOTICE: This fax transmission is intended only for the addressee. It contains information that is legally privileged, confidential or otherwise protected from use or disclosure. If you are not the intended recipient, you are strictly prohibited from reviewing, disclosing, copying using or disseminating any of this information or taking any action in reliance on or regarding this information. If you have received this fax in error, please notify us immediately by telephone so that we can arrange for its return to [place]. Phone: [phone], Toll-Free: [phone], Fax: [phone] Page: 2 of 2 Call Id: [id number] 12/04/2018 8:53:14 PM Go to ED Now Yes Windle Guard, RN, Otho Najjar Disagree/Comply Comply Caller Understands Yes PreDisposition Call Doctor Care Advice Given Per Guideline GO TO ED NOW: * You need to be seen in the Emergency Department. DRIVING: Another adult should drive. CARE ADVICE given per Back Injury (Adult) guideline. Comments User: Harvin Hazel, RN Date/Time Eilene Ghazi Time): 12/04/2018 9:00:38 PM Caller states she will discuss going to ED with her husband. States they have reservations about going in d/t possible exposure to COVID - 19 virus. Referrals GO TO FACILITY REFUSED

## 2018-12-05 NOTE — Telephone Encounter (Signed)
I spoke with pts husband and pt is resting now and seemingly doing OK. Mr Feldt said they would cb if anything further needed.since pt was resting now she did not want to come to phone.pt did not go to UC or ED last night. FYI to Dr Glori Bickers.

## 2018-12-05 NOTE — Telephone Encounter (Signed)
Aware, thanks!

## 2018-12-08 ENCOUNTER — Emergency Department: Payer: Medicare Other

## 2018-12-08 ENCOUNTER — Emergency Department
Admission: EM | Admit: 2018-12-08 | Discharge: 2018-12-08 | Disposition: A | Discharge status: Home / Self Care | Payer: Medicare Other | Attending: Emergency Medicine | Admitting: Emergency Medicine

## 2018-12-08 ENCOUNTER — Encounter: Payer: Self-pay | Admitting: Physician Assistant

## 2018-12-08 ENCOUNTER — Other Ambulatory Visit: Payer: Self-pay

## 2018-12-08 DIAGNOSIS — Z923 Personal history of irradiation: Secondary | ICD-10-CM | POA: Insufficient documentation

## 2018-12-08 DIAGNOSIS — S20221A Contusion of right back wall of thorax, initial encounter: Secondary | ICD-10-CM

## 2018-12-08 DIAGNOSIS — Y998 Other external cause status: Secondary | ICD-10-CM | POA: Diagnosis not present

## 2018-12-08 DIAGNOSIS — S3992XA Unspecified injury of lower back, initial encounter: Secondary | ICD-10-CM | POA: Diagnosis present

## 2018-12-08 DIAGNOSIS — Z87891 Personal history of nicotine dependence: Secondary | ICD-10-CM | POA: Insufficient documentation

## 2018-12-08 DIAGNOSIS — Z79899 Other long term (current) drug therapy: Secondary | ICD-10-CM | POA: Insufficient documentation

## 2018-12-08 DIAGNOSIS — Y9301 Activity, walking, marching and hiking: Secondary | ICD-10-CM | POA: Diagnosis not present

## 2018-12-08 DIAGNOSIS — Z85828 Personal history of other malignant neoplasm of skin: Secondary | ICD-10-CM | POA: Diagnosis not present

## 2018-12-08 DIAGNOSIS — Z8541 Personal history of malignant neoplasm of cervix uteri: Secondary | ICD-10-CM | POA: Insufficient documentation

## 2018-12-08 DIAGNOSIS — W19XXXA Unspecified fall, initial encounter: Secondary | ICD-10-CM

## 2018-12-08 DIAGNOSIS — Y92012 Bathroom of single-family (private) house as the place of occurrence of the external cause: Secondary | ICD-10-CM | POA: Diagnosis not present

## 2018-12-08 DIAGNOSIS — W010XXA Fall on same level from slipping, tripping and stumbling without subsequent striking against object, initial encounter: Secondary | ICD-10-CM | POA: Insufficient documentation

## 2018-12-08 DIAGNOSIS — Y92009 Unspecified place in unspecified non-institutional (private) residence as the place of occurrence of the external cause: Secondary | ICD-10-CM

## 2018-12-08 MED ORDER — TRAMADOL HCL 50 MG PO TABS
50.0000 mg | ORAL_TABLET | Freq: Once | ORAL | Status: AC
  Administered 2018-12-08: 50 mg via ORAL
  Filled 2018-12-08: qty 1

## 2018-12-08 MED ORDER — TRAMADOL HCL 50 MG PO TABS: 50.0000 mg | ORAL_TABLET | Freq: Two times a day (BID) | ORAL | 0 refills | Status: DC

## 2018-12-08 NOTE — ED Triage Notes (Signed)
Pt states she tripped and fell Wednesday and is having lower back pain .

## 2018-12-08 NOTE — ED Provider Notes (Signed)
[facility] Emergency Department Provider Note ____________________________________________  Time seen: 1248  I have reviewed the triage vital signs and the nursing notes.  HISTORY  Chief Complaint  Fall and Back Pain  HPI Veronica Ward is a 73 y.o. female presents herself to the ED for evaluation of lower back pain following a mechanical fall.  Patient describes being at home in her bathroom, on Wednesday, when she tripped over towels her husband left to the floor.  She describes falling backwards on the tile floor, landing on her left lower back.  Has any head injury, loss of consciousness, nausea, vomiting, dizziness patient also denies any cough, chest pain, hemoptysis, weakness, distal paresthesias.  She has been taking over-the-counter pain medicines with limited benefit.  She presents now for further evaluation of what she describes as catching pain to her right low back.  She denies any dysuria, hematuria, saddle anesthesias, distal paresthesias, foot drop.  Past Medical History:  Diagnosis Date  . Anemia   . Anxiety   . Ashkenazi Jewish ancestry   . Diffuse cystic mastopathy   . Disorder of bone and cartilage, unspecified   . Dysautonomia (HCC)    mild  . Endometrial cancer ([place])   . Headache(784.0)   . History of shingles   . Hx of basal cell carcinoma    face  . Osteoporosis   . Other malaise and fatigue   . Other psoriasis   . Other seborrheic keratosis   . Pain in limb   . Predominant disturbance of emotions   . Radiation 01/06/14, 12/23/13, 12/16/13, 12/09/13   HDR brachytherapy  . Sleep disturbance, unspecified   . Unspecified hemorrhoids without mention of complication   . Urticaria, unspecified     Patient Active Problem List   Diagnosis Date Noted  . Hip pain, bilateral 09/19/2018  . Stress reaction 05/31/2018  . Headache 12/06/2017  . Dyspepsia 12/06/2017  . Grief 12/06/2017  . Foot fracture 07/10/2016  . Homocysteinemia ([place])  05/09/2016  . Estrogen deficiency 07/21/2015  . Ashkenazi Jewish ancestry   . Endometrial cancer ([place]) 11/03/2013  . History of uterine cancer 10/28/2013  . CIS (carcinoma in situ of cervix) 09/23/2013  . Encounter for Medicare annual wellness exam 03/12/2013  . Colon cancer screening 03/12/2013  . Routine general medical examination at a health care facility 03/03/2013  . Encounter for medication monitoring 11/23/2010  . SEBORRHEIC KERATOSIS 08/25/2009  . SLEEP DISORDER 12/09/2008  . HEMORRHOIDS 10/12/2008  . Osteoporosis 10/08/2007  . BASAL CELL CARCINOMA, FACE 06/21/2007  . FIBROCYSTIC BREAST DISEASE 06/21/2007  . PSORIASIS 06/21/2007  . URTICARIA 06/21/2007  . BASAL CELL CARCINOMA, FACE 06/21/2007    Past Surgical History:  Procedure Laterality Date  . AXILLARY LYMPH NODE DISSECTION     left  . birthmark removal  childhood  . BREAST BIOPSY Left 2001   benign  . BREAST EXCISIONAL BIOPSY Left    benign  . BREAST LUMPECTOMY     left underarm  . cervical cancer  1973  . CERVICAL CONIZATION W/BX    . COLONOSCOPY  12/04   int. hemorrhoids  . DEXA  03/2002   oseopenia  . DEXA  07/2004   decreased BMD, osteopenia  . DEXA  02/2007   Osteopenia  . DEXA  9/10   Osteopenia slightly worse  . felon I&D  12/2010   Dr. Fredna Dow, I&D in OR (L index finger)  . FINGER SURGERY    . KNEE SURGERY  right  . ROBOTIC ASSISTED TOTAL HYSTERECTOMY WITH BILATERAL SALPINGO OOPHERECTOMY  10/03/2013   Robotic-assisted total laparoscopic hysterectomy, bilateral salpingo-oophorectomy, bilateral pelvic and para-aortic lymphadenectomy with sentinel lymph node dissection  . TONSILECTOMY, ADENOIDECTOMY, BILATERAL MYRINGOTOMY AND TUBES    . TUBAL LIGATION      Prior to Admission medications   Medication Sig Start Date End Date Taking? Authorizing Provider  ALPRAZolam (XANAX) 0.5 MG tablet TAKE 1 TABLET (0.5 MG TOTAL) BY MOUTH 2 (TWO) TIMES DAILY AS NEEDED FOR ANXIETY OR SLEEP. 10/25/18   Tower, Wynelle Fanny, MD  Ascorbic Acid (VITAMIN C PO) Take 1 tablet by mouth daily.    [provider]  cholecalciferol (VITAMIN D) 400 UNITS TABS Take 400 Units by mouth daily.     [provider]  co-enzyme Q-10 30 MG capsule Take 30 mg by mouth daily.     [provider]  fish oil-omega-3 fatty acids 1000 MG capsule Take by mouth daily.     [provider]  gabapentin (NEURONTIN) 100 MG capsule TAKE 1 CAPSULE BY MOUTH THREE TIMES A DAY 04/10/18   Tower, Marne A, MD  ibuprofen (ADVIL,MOTRIN) 200 MG tablet Take 200 mg by mouth as needed for pain. Reported on 12/15/2015    [provider]  L-Methylfolate 7.5 MG TABS Take 1 tablet (7.5 mg total) by mouth daily. 11/02/17   Tower, Wynelle Fanny, MD  Lactobacillus (CVS PROBIOTIC ACIDOPHILUS) 10 MG CAPS Take 1 capsule by mouth daily.     [provider]  Magnesium 250 MG TABS Take 1 tablet by mouth daily.    [provider]  methocarbamol (ROBAXIN) 500 MG tablet Take 1 tablet (500 mg total) by mouth at bedtime as needed for muscle spasms. 09/19/18   Tower, Wynelle Fanny, MD  Multiple Minerals-Vitamins (CALCIUM-MAGNESIUM-ZINC-D3 PO) Take 2 capsules by mouth daily.    [provider]  Multiple Vitamin (MULTIVITAMIN) tablet Take 1 tablet by mouth daily.    [provider]  traMADol (ULTRAM) 50 MG tablet Take 1 tablet (50 mg total) by mouth 2 (two) times daily. 12/08/18   Mary-Ann Pennella, Dannielle Karvonen, PA-C    Allergies Bactrim [sulfamethoxazole-trimethoprim]; Benadryl [diphenhydramine hcl (sleep)]; Oxycodone-acetaminophen; and Sulfa antibiotics  Family History  Problem Relation Age of Onset  . Multiple myeloma Father   . Coronary artery disease Father   . Transient ischemic attack Mother   . Multiple sclerosis Brother   . Lupus Maternal Aunt   . Breast cancer Maternal Aunt        dx 96s; deceased  . Cancer Maternal Grandfather        GI cancer or pancreatic; deceased 62  . Ovarian cancer Maternal Aunt         dx 38s; deceased 29s  . Colon cancer Neg Hx     Social History Social History   Tobacco Use  . Smoking status: Former Smoker    Last attempt to quit: 07/25/1963    Years since quitting: 55.4  . Smokeless tobacco: Never Used  Substance Use Topics  . Alcohol use: Yes    Alcohol/week: 1.0 standard drinks    Types: 1 Glasses of wine per week    Comment: 1/2 glass of wine a day  . Drug use: No    Review of Systems  Constitutional: Negative for fever. Eyes: Negative for visual changes. ENT: Negative for sore throat. Cardiovascular: Negative for chest pain. Respiratory: Negative for shortness of breath. Gastrointestinal: Negative for abdominal pain, vomiting and diarrhea. Genitourinary:  Negative for dysuria. Musculoskeletal: Positive for back pain. Skin: Negative for rash. Neurological: Negative for headaches, focal weakness or numbness. ____________________________________________  PHYSICAL EXAM:  VITAL SIGNS: ED Triage Vitals  Enc Vitals Group     BP 12/08/18 1215 134/71     Pulse Rate 12/08/18 1215 86     Resp 12/08/18 1215 16     Temp 12/08/18 1215 98.8 F (37.1 C)     Temp Source 12/08/18 1215 Oral     SpO2 12/08/18 1215 96 %     Weight 12/08/18 1158 102 lb (46.3 kg)     Height 12/08/18 1158 '5\' 2"'  (1.575 m)     Head Circumference --      Peak Flow --      Pain Score 12/08/18 1157 2     Pain Loc --      Pain Edu? --      Excl. in [place]? --     Constitutional: Alert and oriented. Well appearing and in no distress. Head: Normocephalic and atraumatic. Eyes: Conjunctivae are normal. Normal extraocular movements Neck: Supple. Normal ROM without crepitus. Cardiovascular: Normal rate, regular rhythm. Normal distal pulses. Respiratory: Normal respiratory effort. No wheezes/rales/rhonchi. Gastrointestinal: Soft and nontender. No distention.  No frank CVA tenderness elicited. Musculoskeletal: Spinal alignment without midline tenderness, spasm, deformity, or step-off.   Patient is tender to palpation over the posterior right lumbar junction.  Pain does not radiate or refer on palpation.  She also seems to have intermittent spasms in the same region on the right side.  Nontender with normal range of motion in all extremities.  Neurologic:  Normal gait without ataxia. Normal speech and language. No gross focal neurologic deficits are appreciated. Skin:  Skin is warm, dry and intact. No rash noted. Psychiatric: Mood and affect are normal. Patient exhibits appropriate insight and judgment. ____________________________________________   RADIOLOGY  DG Lumbar Spine   negative ____________________________________________  PROCEDURES  Procedures Ultram 50 mg PO ____________________________________________  INITIAL IMPRESSION / ASSESSMENT AND PLAN / ED COURSE  Veronica Ward was evaluated in Emergency Department on 12/10/2018 for the symptoms described in the history of present illness. She was evaluated in the context of the global COVID-19 pandemic, which necessitated consideration that the patient might be at risk for infection with the SARS-CoV-2 virus that causes COVID-19. Institutional protocols and algorithms that pertain to the evaluation of patients at risk for COVID-19 are in a state of rapid change based on information released by regulatory bodies including the CDC and federal and state organizations. These policies and algorithms were followed during the patient's care in the ED.  Geriatric patient with ED evaluation of acute pain following a mechanical fall that persist.  Patient's exam is overall benign and reassuring at this time.  X-ray of the lumbar spine does not reveal any acute fracture process.  Symptoms may represent a back contusion and subsequent muscle spasm.  Patient is inclined to take prescription Ultram for acute pain at this time.  She will follow-up with her primary provider for ongoing symptoms or return to the ED as  discussed. ____________________________________________  FINAL CLINICAL IMPRESSION(S) / ED DIAGNOSES  Final diagnoses:  Fall in home, initial encounter  Back contusion, right, initial encounter      Melvenia Needles, PA-C 12/10/18 0805    Arta Silence, MD 12/15/18 [phone]

## 2018-12-08 NOTE — ED Notes (Signed)
Pt signed physical discharge form. 

## 2018-12-08 NOTE — ED Notes (Signed)
PA at bedside.

## 2018-12-08 NOTE — Discharge Instructions (Addendum)
Your exam and X-rays are negative and reassuring at this time. Take the prescription pain medicine as directed. Follow-up with your provider for ongoing symptoms. Return as needed.

## 2018-12-08 NOTE — ED Notes (Signed)
Patient transported to X-ray 

## 2018-12-09 ENCOUNTER — Ambulatory Visit (INDEPENDENT_AMBULATORY_CARE_PROVIDER_SITE_OTHER): Payer: Medicare Other | Admitting: Family Medicine

## 2018-12-09 ENCOUNTER — Telehealth: Payer: Self-pay | Admitting: Family Medicine

## 2018-12-09 ENCOUNTER — Telehealth: Payer: Self-pay

## 2018-12-09 ENCOUNTER — Ambulatory Visit: Payer: Self-pay | Admitting: Radiation Oncology

## 2018-12-09 ENCOUNTER — Encounter: Payer: Self-pay | Admitting: Family Medicine

## 2018-12-09 DIAGNOSIS — W19XXXS Unspecified fall, sequela: Secondary | ICD-10-CM

## 2018-12-09 DIAGNOSIS — R109 Unspecified abdominal pain: Secondary | ICD-10-CM | POA: Diagnosis not present

## 2018-12-09 DIAGNOSIS — Y92009 Unspecified place in unspecified non-institutional (private) residence as the place of occurrence of the external cause: Secondary | ICD-10-CM | POA: Diagnosis not present

## 2018-12-09 DIAGNOSIS — W19XXXA Unspecified fall, initial encounter: Secondary | ICD-10-CM | POA: Insufficient documentation

## 2018-12-09 MED ORDER — PROMETHAZINE HCL 25 MG RE SUPP: 25.0000 mg | Freq: Three times a day (TID) | RECTAL | 1 refills | Status: DC | PRN

## 2018-12-09 MED ORDER — HYDROCODONE-ACETAMINOPHEN 5-325 MG PO TABS: 1.0000 | ORAL_TABLET | Freq: Four times a day (QID) | ORAL | 0 refills | Status: DC | PRN

## 2018-12-09 MED ORDER — ONDANSETRON HCL 8 MG PO TABS: 8.0000 mg | ORAL_TABLET | Freq: Three times a day (TID) | ORAL | 0 refills | Status: AC | PRN

## 2018-12-09 NOTE — Progress Notes (Signed)
Virtual Visit via Video Note  I connected with Veronica Ward on 12/09/18 at  9:00 AM EDT by a video enabled telemedicine application and verified that I am speaking with the correct person using two identifiers.  Location: Patient: home Provider: office   I discussed the limitations of evaluation and management by telemedicine and the availability of in person appointments. The patient expressed understanding and agreed to proceed.  History of Present Illness: Pt presents with back pain as well as n/v  She was seen in ED yesterday  She presented for eval of low back pain following fall in her bathroom earlier in the week (Wed)  Fell backwards landing on right lower back  No head injury (did bruise her face) Took otc pain medicines (tylenol and advil)  No urinary symptoms   Right after the fall had a headache and nausea (immediately) Was able to get up on her own Headache went away after 2 days   Ever since she has had excruciating back pain  Did not sleep for days  Standing is most comfortable position   Saturday she fell asleep on a chair outdoors -felt a little better  She got worse after supper - then more uncomfortable Sunday /early am went to ED- [facility]  Was told possible fracture of rib -did not xray that area  Px tramadol for pain - this helped in the ED She went home   Tried to eat a veggie burger She felt nauseated after that and then vomited  Tried to sleep in bed that night  Took another pill Did not completely help- vomited after 15 minutes (hurts her back to do this) Also headache   No cough and no sob  Hurts to burp/sneeze  Is able to take a deep breath   Worst pain is on R side at waist area -just below bra line and radiates up and to the side   She tried ice-the pressure of it hurts Warm feels better   Review of Systems  Constitutional: Positive for malaise/fatigue. Negative for chills, diaphoresis, fever and weight loss.  Eyes: Negative for blurred  vision.  Respiratory: Negative for cough, sputum production, shortness of breath and wheezing.   Cardiovascular: Negative for palpitations.  Gastrointestinal: Positive for nausea and vomiting. Negative for abdominal pain.  Genitourinary: Negative for dysuria.  Musculoskeletal: Positive for back pain and myalgias.       Chest wall/flank/back pain  Skin: Negative for rash.  Neurological: Positive for headaches. Negative for dizziness, speech change and focal weakness.     XRAY: Dg Lumbar Spine Complete  Result Date: 12/08/2018 CLINICAL DATA:  Post fall this past Wednesday with persistent right-sided back pain. EXAM: LUMBAR SPINE - COMPLETE 4+ VIEW COMPARISON:  None. FINDINGS: There are 5 non rib-bearing lumbar type vertebral bodies. Normal alignment of the lumbar spine. No anterolisthesis or retrolisthesis. No pars defects. Lumbar vertebral body heights are preserved. Lumbar intervertebral disc space heights appear preserved. Limited visualization of the bilateral SI joints is normal. Surgical clips overlie the midline of the abdomen. Moderate colonic stool burden without evidence of enteric obstruction. IMPRESSION: No explanation patient's right-sided low back pain. Electronically Signed   By: Sandi Mariscal M.D.   On: 12/08/2018 13:28        Patient Active Problem List   Diagnosis Date Noted  . Right flank pain 12/09/2018  . Fall at home 12/09/2018  . Hip pain, bilateral 09/19/2018  . Stress reaction 05/31/2018  . Headache 12/06/2017  . Dyspepsia  12/06/2017  . Grief 12/06/2017  . Foot fracture 07/10/2016  . Homocysteinemia ([place]) 05/09/2016  . Estrogen deficiency 07/21/2015  . Ashkenazi Jewish ancestry   . Endometrial cancer ([place]) 11/03/2013  . History of uterine cancer 10/28/2013  . CIS (carcinoma in situ of cervix) 09/23/2013  . Encounter for Medicare annual wellness exam 03/12/2013  . Colon cancer screening 03/12/2013  . Routine general medical examination at a health care  facility 03/03/2013  . Encounter for medication monitoring 11/23/2010  . SEBORRHEIC KERATOSIS 08/25/2009  . SLEEP DISORDER 12/09/2008  . HEMORRHOIDS 10/12/2008  . Osteoporosis 10/08/2007  . BASAL CELL CARCINOMA, FACE 06/21/2007  . FIBROCYSTIC BREAST DISEASE 06/21/2007  . PSORIASIS 06/21/2007  . URTICARIA 06/21/2007  . BASAL CELL CARCINOMA, FACE 06/21/2007   Past Medical History:  Diagnosis Date  . Anemia   . Anxiety   . Ashkenazi Jewish ancestry   . Diffuse cystic mastopathy   . Disorder of bone and cartilage, unspecified   . Dysautonomia ([facility])    mild  . Endometrial cancer ([place])   . Headache(784.0)   . History of shingles   . Hx of basal cell carcinoma    face  . Osteoporosis   . Other malaise and fatigue   . Other psoriasis   . Other seborrheic keratosis   . Pain in limb   . Predominant disturbance of emotions   . Radiation 01/06/14, 12/23/13, 12/16/13, 12/09/13   HDR brachytherapy  . Sleep disturbance, unspecified   . Unspecified hemorrhoids without mention of complication   . Urticaria, unspecified    Past Surgical History:  Procedure Laterality Date  . AXILLARY LYMPH NODE DISSECTION     left  . birthmark removal  childhood  . BREAST BIOPSY Left 2001   benign  . BREAST EXCISIONAL BIOPSY Left    benign  . BREAST LUMPECTOMY     left underarm  . cervical cancer  1973  . CERVICAL CONIZATION W/BX    . COLONOSCOPY  12/04   int. hemorrhoids  . DEXA  03/2002   oseopenia  . DEXA  07/2004   decreased BMD, osteopenia  . DEXA  02/2007   Osteopenia  . DEXA  9/10   Osteopenia slightly worse  . felon I&D  12/2010   Dr. Fredna Dow, I&D in OR (L index finger)  . FINGER SURGERY    . KNEE SURGERY     right  . ROBOTIC ASSISTED TOTAL HYSTERECTOMY WITH BILATERAL SALPINGO OOPHERECTOMY  10/03/2013   Robotic-assisted total laparoscopic hysterectomy, bilateral salpingo-oophorectomy, bilateral pelvic and para-aortic lymphadenectomy with sentinel lymph node dissection  . TONSILECTOMY,  ADENOIDECTOMY, BILATERAL MYRINGOTOMY AND TUBES    . TUBAL LIGATION     Social History   Tobacco Use  . Smoking status: Former Smoker    Last attempt to quit: 07/25/1963    Years since quitting: 55.4  . Smokeless tobacco: Never Used  Substance Use Topics  . Alcohol use: Yes    Alcohol/week: 1.0 standard drinks    Types: 1 Glasses of wine per week    Comment: 1/2 glass of wine a day  . Drug use: No   Family History  Problem Relation Age of Onset  . Multiple myeloma Father   . Coronary artery disease Father   . Transient ischemic attack Mother   . Multiple sclerosis Brother   . Lupus Maternal Aunt   . Breast cancer Maternal Aunt        dx 49s; deceased  . Cancer Maternal Grandfather  GI cancer or pancreatic; deceased 51  . Ovarian cancer Maternal Aunt        dx 67s; deceased 36s  . Colon cancer Neg Hx    Allergies  Allergen Reactions  . Tramadol     Nausea and vomiting   . Bactrim [Sulfamethoxazole-Trimethoprim] Rash  . Benadryl [Diphenhydramine Hcl (Sleep)] Rash and Anxiety    Fever, body ache  . Oxycodone-Acetaminophen Anxiety  . Sulfa Antibiotics Rash   Current Outpatient Medications on File Prior to Visit  Medication Sig Dispense Refill  . ALPRAZolam (XANAX) 0.5 MG tablet TAKE 1 TABLET (0.5 MG TOTAL) BY MOUTH 2 (TWO) TIMES DAILY AS NEEDED FOR ANXIETY OR SLEEP. 15 tablet 0  . Ascorbic Acid (VITAMIN C PO) Take 1 tablet by mouth daily.    . cholecalciferol (VITAMIN D) 400 UNITS TABS Take 400 Units by mouth daily.     Marland Kitchen co-enzyme Q-10 30 MG capsule Take 30 mg by mouth daily.     . fish oil-omega-3 fatty acids 1000 MG capsule Take by mouth daily.     Marland Kitchen gabapentin (NEURONTIN) 100 MG capsule TAKE 1 CAPSULE BY MOUTH THREE TIMES A DAY 270 capsule 3  . ibuprofen (ADVIL,MOTRIN) 200 MG tablet Take 200 mg by mouth as needed for pain. Reported on 12/15/2015    . L-Methylfolate 7.5 MG TABS Take 1 tablet (7.5 mg total) by mouth daily. 30 tablet 11  . Lactobacillus ([employer]  PROBIOTIC ACIDOPHILUS) 10 MG CAPS Take 1 capsule by mouth daily.     . Magnesium 250 MG TABS Take 1 tablet by mouth daily.    . methocarbamol (ROBAXIN) 500 MG tablet Take 1 tablet (500 mg total) by mouth at bedtime as needed for muscle spasms. 30 tablet 0  . Multiple Minerals-Vitamins (CALCIUM-MAGNESIUM-ZINC-D3 PO) Take 2 capsules by mouth daily.    . Multiple Vitamin (MULTIVITAMIN) tablet Take 1 tablet by mouth daily.     No current facility-administered medications on file prior to visit.     Observations/Objective: Patient appears well, but fatigued and in discomfort (not distressed)  Also spoke with her husband who was helpful Weight is baseline  No facial swelling or asymmetry Normal voice-not hoarse and no slurred speech No obvious tremor or mobility impairment Moving neck and UEs normally Able to hear the call well  No cough or shortness of breath during interview  Talkative and mentally sharp with no cognitive changes No skin changes on face or neck , no rash or pallor Affect is normal     Assessment and Plan: Problem List Items Addressed This Visit      Other   Right flank pain - Primary    Severe since a fall on that side  Seen in ED Reviewed hospital records, lab results and studies in detail   Reassuring LS films  Suspect R lower/lat rib fx based on history /location/intensity  Tramadol helped but caused nausea /vomiting and headache  Discussed imp of pain and nausea control  Will try norco 5-325 mg 1 pill up to every 6 hours prn (with caution of sedation)  zofran for nausea prn  Watch closely for cough and sob  Watch for worse or persistent headache (she did hit face but not "head")  Continue warm compress Ibuprofen is also ok if she can have food on her stomach If worse-inst to call and go to ED if needed  Disc imp of deep breaths as tolerated to prevent atelectasis  Update if not starting to improve in a week  or if worsening        Fall at home    Fall  with injury to R flank and back -seen in ED yesterday Reviewed hospital records, lab results and studies in detail   LS xray normal  Did not do rib films but suspect a fx rib given intensity of pain and location            Follow Up Instructions: Take the generic zofran for nausea and vomiting  Then try norco 5-325 (hydrocodone) for pain -= preferably with food if you can keep it down  Ibuprofen is ok with food also as needed  Use heat if helpful  Avoid doing things that hurt  If you become short of breath or develop a cough let us know (go to ER if severe)  If this is a broken rib this should slowly get better-pain control is most important Update if not starting to improve in a week or if worsening     I discussed the assessment and treatment plan with the patient. The patient was provided an opportunity to ask questions and all were answered. The patient agreed with the plan and demonstrated an understanding of the instructions.   The patient was advised to call back or seek an in-person evaluation if the symptoms worsen or if the condition fails to improve as anticipated.     [name], MD

## 2018-12-09 NOTE — Assessment & Plan Note (Signed)
Severe since a fall on that side  Seen in ED Reviewed hospital records, lab results and studies in detail   Reassuring LS films  Suspect R lower/lat rib fx based on history /location/intensity  Tramadol helped but caused nausea /vomiting and headache  Discussed imp of pain and nausea control  Will try norco 5-325 mg 1 pill up to every 6 hours prn (with caution of sedation)  zofran for nausea prn  Watch closely for cough and sob  Watch for worse or persistent headache (she did hit face but not "head")  Continue warm compress Ibuprofen is also ok if she can have food on her stomach If worse-inst to call and go to ED if needed  Disc imp of deep breaths as tolerated to prevent atelectasis  Update if not starting to improve in a week or if worsening

## 2018-12-09 NOTE — Telephone Encounter (Signed)
Notified patient and husband.  They both verbalize understanding and will give [place] an update tomorrow.

## 2018-12-09 NOTE — Telephone Encounter (Signed)
Patient had virtual visit with provider today, please refer to all encounters with PCP for today (5/18) for details.

## 2018-12-09 NOTE — Assessment & Plan Note (Signed)
Fall with injury to R flank and back -seen in ED yesterday Reviewed hospital records, lab results and studies in detail   LS xray normal  Did not do rib films but suspect a fx rib given intensity of pain and location

## 2018-12-09 NOTE — Telephone Encounter (Signed)
I sent that in -please let them know  If condition worsens go to ER to get IV fluid Update [place] tomorrow   The phenergan may make her sleepy-let them know that

## 2018-12-09 NOTE — Telephone Encounter (Signed)
Patient's Husband [name] called today stating that the patient was prescribed 2 medications for nausea and pain.  She was instructed to take the nausea medication first before trying to take the pain medication that was prescribed. Her Husband stated that she tried to take the nausea medication and ended up not being able to hold it down.    They are not sure what they should do since she is unable to take these due to vomiting and the medication coming back up  PHONE- [phone]

## 2018-12-09 NOTE — Telephone Encounter (Signed)
I can re sent a zofran in the form of a dissolving tablet - ? That may be easier to get down  Also phenergan suppositories are an option (rectal)   Let me know what she prefers

## 2018-12-09 NOTE — Telephone Encounter (Signed)
Pt said that given severity of her vomiting she would like to try the phenergan suppositories. She doesn't think the zofran will help since she isn't able to keep any thing down. Pt and spouse were both on the phone and they wanted to make sure Dr. Glori Bickers was aware that pt hasn't eaten in over 24 hrs. She said the thought of food is making her nauseous. I asked pt was she still able to drink fluids and she said she can only keep sips of water down so she is afraid of dehydration or having issues since she hasn't eaten. Also pt was coughing and clearing her throat often during call and she said she vomited not to long ago and she feels like she might have "aspirated", but when she was telling me this her husband said that "she was fine now" and she didn't talk about it anymore, but I still wanted to document that as well

## 2018-12-09 NOTE — Telephone Encounter (Signed)
[facility] Patient Name: Veronica Ward Gender: Female DOB: Feb 12, 1946 Age: 73 Y 10 M 6 D Return Phone Number: [phone] (Primary), [phone] (Secondary) Address[place] [address] Client [place] [facility] - Client Client Site [facility], [name] - MD Contact Type Call Who Is Calling Patient / Member / Family / Caregiver Call Type Triage / Clinical Relationship To Patient Self Return Phone Number [phone] (Primary) Chief Complaint Flank Pain Reason for Call Request to Schedule Office Appointment Initial Comment Caller states that she fell last Wednesday and she is having pain in her right side of her mid to lower back. She is unable to sleep or do anything really due to the pain. She would like to schedule an appt for Monday. Translation No Nurse Assessment Nurse: Edmonia Lynch, RN, Dena Date/Time (Eastern Time): 12/08/2018 9:28:34 AM Confirm and document reason for call. If symptomatic, describe symptoms. ---Pt c/o lower/mid back pain on right side; onset with fall on Wed night. She has not seen provider following fall. Has the patient had close contact with a person known or suspected to have the novel coronavirus illness OR traveled / lives in area with major community spread (including international travel) in the last 14 days from the onset of symptoms? * If Asymptomatic, screen for exposure and travel within the last 14 days. ---Not Applicable Does the patient have any new or worsening symptoms? ---Yes Will a triage be completed? ---Yes Related visit to physician within the last 2 weeks? ---No Does the PT have any chronic conditions? (i.e. diabetes, asthma, this includes High risk factors for pregnancy, etc.) ---No Is this a behavioral health or substance abuse call?  ---No Guidelines Guideline Title Affirmed Question Affirmed Notes Nurse Date/Time (Eastern Time) Back Injury [1] SEVERE PAIN in kidney area (flank) AND [2] follows direct blow to that site [place], Therapist, sports, [place] 12/08/2018 9:32:34 AM Disp. Time Eilene Ghazi Time) Disposition Final User PLEASE NOTE: All timestamps contained within this report are represented as [place] Standard Time. CONFIDENTIALTY NOTICE: This fax transmission is intended only for the addressee. It contains information that is legally privileged, confidential or otherwise protected from use or disclosure. If you are not the intended recipient, you are strictly prohibited from reviewing, disclosing, copying using or disseminating any of this information or taking any action in reliance on or regarding this information. If you have received this fax in error, please notify us immediately by telephone so that we can arrange for its return to [place]. Phone: [phone], Toll-Free: [phone], Fax: [phone] Page: 2 of 2 Call Id: [id number] 12/08/2018 9:51:09 AM Go to ED Now Yes Edmonia Lynch, RN, Dena Caller Disagree/Comply Comply Caller Understands Yes PreDisposition Call another nurse Care Advice Given Per Guideline GO TO ED NOW: * You need to be seen in the Emergency Department. * Go to the ED at ___________ [place] now. Drive carefully. CARE ADVICE given per Back Injury (Adult) guideline. Comments User: Willis Modena, RN Date/Time Eilene Ghazi Time): 12/08/2018 9:44:24 AM Pt states when she first fell she immediately developed a headache and was nauseated. Denies nausea at this time. Pt does not want to go to ER; is afraid of Covid. States she might go to ER but will likely go to office tomorrow to be seen with PCP. User: Willis Modena, RN Date/Time Eilene Ghazi Time): 12/08/2018 9:51:08 AM Extensive education provided due to pts apprehension to seek medical  care in ED; pt did state at end of call that she will go to ED  for care. Referrals [facility] - ED

## 2018-12-09 NOTE — Patient Instructions (Addendum)
Take the generic zofran for nausea and vomiting  Then try norco 5-325 (hydrocodone) for pain -= preferably with food if you can keep it down  Ibuprofen is ok with food also as needed  Use heat if helpful  Avoid doing things that hurt  If you become short of breath or develop a cough let us know (go to ER if severe)  If this is a broken rib this should slowly get better-pain control is most important Update if not starting to improve in a week or if worsening

## 2018-12-10 ENCOUNTER — Ambulatory Visit (INDEPENDENT_AMBULATORY_CARE_PROVIDER_SITE_OTHER): Payer: Medicare Other | Admitting: Psychology

## 2018-12-10 DIAGNOSIS — F4323 Adjustment disorder with mixed anxiety and depressed mood: Secondary | ICD-10-CM | POA: Diagnosis not present

## 2018-12-24 ENCOUNTER — Ambulatory Visit (INDEPENDENT_AMBULATORY_CARE_PROVIDER_SITE_OTHER): Payer: Medicare Other | Admitting: Psychology

## 2018-12-24 DIAGNOSIS — F4323 Adjustment disorder with mixed anxiety and depressed mood: Secondary | ICD-10-CM

## 2019-01-07 ENCOUNTER — Ambulatory Visit (INDEPENDENT_AMBULATORY_CARE_PROVIDER_SITE_OTHER): Payer: Medicare Other | Admitting: Psychology

## 2019-01-07 DIAGNOSIS — F4323 Adjustment disorder with mixed anxiety and depressed mood: Secondary | ICD-10-CM | POA: Diagnosis not present

## 2019-01-20 ENCOUNTER — Telehealth: Payer: Self-pay | Admitting: Family Medicine

## 2019-01-20 NOTE — Telephone Encounter (Signed)
error 

## 2019-01-21 ENCOUNTER — Ambulatory Visit (INDEPENDENT_AMBULATORY_CARE_PROVIDER_SITE_OTHER): Payer: Medicare Other | Admitting: Psychology

## 2019-01-21 DIAGNOSIS — F4323 Adjustment disorder with mixed anxiety and depressed mood: Secondary | ICD-10-CM

## 2019-02-04 ENCOUNTER — Ambulatory Visit (INDEPENDENT_AMBULATORY_CARE_PROVIDER_SITE_OTHER): Payer: Medicare Other | Admitting: Psychology

## 2019-02-04 DIAGNOSIS — F4323 Adjustment disorder with mixed anxiety and depressed mood: Secondary | ICD-10-CM

## 2019-02-06 ENCOUNTER — Other Ambulatory Visit: Payer: Self-pay

## 2019-02-06 ENCOUNTER — Other Ambulatory Visit (INDEPENDENT_AMBULATORY_CARE_PROVIDER_SITE_OTHER): Payer: Medicare Other

## 2019-02-06 DIAGNOSIS — M810 Age-related osteoporosis without current pathological fracture: Secondary | ICD-10-CM

## 2019-02-06 DIAGNOSIS — Z0000 Encounter for general adult medical examination without abnormal findings: Secondary | ICD-10-CM | POA: Diagnosis not present

## 2019-02-06 LAB — LIPID PANEL
Cholesterol: 207 mg/dL — ABNORMAL HIGH (ref 0–200)
HDL: 81.5 mg/dL (ref >39.00)
LDL Cholesterol: 107 mg/dL — ABNORMAL HIGH (ref 0–99)
NonHDL: 125.6
Total CHOL/HDL Ratio: 3
Triglycerides: 93 mg/dL (ref 0.0–149.0)
VLDL: 18.6 mg/dL (ref 0.0–40.0)

## 2019-02-06 LAB — COMPREHENSIVE METABOLIC PANEL
ALT: 17 U/L (ref 0–35)
AST: 24 U/L (ref 0–37)
Albumin: 4.3 g/dL (ref 3.5–5.2)
Alkaline Phosphatase: 57 U/L (ref 39–117)
BUN: 13 mg/dL (ref 6–23)
CO2: 29 mEq/L (ref 19–32)
Calcium: 9.4 mg/dL (ref 8.4–10.5)
Chloride: 101 mEq/L (ref 96–112)
Creatinine, Ser: 0.65 mg/dL (ref 0.40–1.20)
GFR: 88.86 mL/min (ref >60.00)
Glucose, Bld: 95 mg/dL (ref 70–99)
Potassium: 4.1 mEq/L (ref 3.5–5.1)
Sodium: 137 mEq/L (ref 135–145)
Total Bilirubin: 0.5 mg/dL (ref 0.2–1.2)
Total Protein: 6.9 g/dL (ref 6.0–8.3)

## 2019-02-06 LAB — CBC WITH DIFFERENTIAL/PLATELET
Basophils Absolute: 0 10*3/uL (ref 0.0–0.1)
Basophils Relative: 0.7 % (ref 0.0–3.0)
Eosinophils Absolute: 0.1 10*3/uL (ref 0.0–0.7)
Eosinophils Relative: 2.3 % (ref 0.0–5.0)
HCT: 36.7 % (ref 36.0–46.0)
Hemoglobin: 12.3 g/dL (ref 12.0–15.0)
Lymphocytes Relative: 26.2 % (ref 12.0–46.0)
Lymphs Abs: 1.2 10*3/uL (ref 0.7–4.0)
MCHC: 33.4 g/dL (ref 30.0–36.0)
MCV: 100.2 fl — ABNORMAL HIGH (ref 78.0–100.0)
Monocytes Absolute: 0.4 10*3/uL (ref 0.1–1.0)
Monocytes Relative: 9.7 % (ref 3.0–12.0)
Neutro Abs: 2.7 10*3/uL (ref 1.4–7.7)
Neutrophils Relative %: 61.1 % (ref 43.0–77.0)
Platelets: 159 10*3/uL (ref 150.0–400.0)
RBC: 3.66 Mil/uL — ABNORMAL LOW (ref 3.87–5.11)
RDW: 13.3 % (ref 11.5–15.5)
WBC: 4.4 10*3/uL (ref 4.0–10.5)

## 2019-02-06 LAB — TSH: TSH: 1.96 u[IU]/mL (ref 0.35–4.50)

## 2019-02-06 LAB — VITAMIN D 25 HYDROXY (VIT D DEFICIENCY, FRACTURES): VITD: 48.19 ng/mL (ref 30.00–100.00)

## 2019-02-10 ENCOUNTER — Ambulatory Visit (INDEPENDENT_AMBULATORY_CARE_PROVIDER_SITE_OTHER): Payer: Medicare Other | Admitting: Family Medicine

## 2019-02-10 ENCOUNTER — Encounter: Payer: Self-pay | Admitting: Family Medicine

## 2019-02-10 ENCOUNTER — Other Ambulatory Visit: Payer: Self-pay

## 2019-02-10 VITALS — BP 114/68 | HR 89 | Temp 98.5°F | Ht 62.5 in | Wt 104.4 lb

## 2019-02-10 DIAGNOSIS — C541 Malignant neoplasm of endometrium: Secondary | ICD-10-CM | POA: Diagnosis not present

## 2019-02-10 DIAGNOSIS — E7219 Other disorders of sulfur-bearing amino-acid metabolism: Secondary | ICD-10-CM

## 2019-02-10 DIAGNOSIS — M810 Age-related osteoporosis without current pathological fracture: Secondary | ICD-10-CM | POA: Diagnosis not present

## 2019-02-10 DIAGNOSIS — R7983 Abnormal findings of blood amino-acid level: Secondary | ICD-10-CM

## 2019-02-10 DIAGNOSIS — Z0000 Encounter for general adult medical examination without abnormal findings: Secondary | ICD-10-CM | POA: Diagnosis not present

## 2019-02-10 MED ORDER — GABAPENTIN 100 MG PO CAPS: ORAL_CAPSULE | ORAL | 0 refills | Status: AC

## 2019-02-10 NOTE — Assessment & Plan Note (Signed)
Continues to do well with oncology and gyn f/u

## 2019-02-10 NOTE — Patient Instructions (Addendum)
Get a flu shot in the fall    Keep exercising  Continue counseling Good luck with everything !   If you are interested in the new shingles vaccine (Shingrix) - call your local pharmacy to check on coverage and availability  If affordable, get on a wait list at your pharmacy to get the vaccine.

## 2019-02-10 NOTE — Progress Notes (Signed)
Subjective:    Patient ID: Veronica Ward, female    DOB: 1946-04-04, 73 y.o.   MRN: [id number]  HPI  Here for amw and annual health mt/ rev of chronic medical problems  Moved in with son in [place]   I have personally reviewed the Medicare Annual Wellness questionnaire and have noted 1. The patient's medical and social history 2. Their use of alcohol, tobacco or illicit drugs 3. Their current medications and supplements 4. The patient's functional ability including ADL's, fall risks, home safety risks and hearing or visual             impairment. 5. Diet and physical activities 6. Evidence for depression or mood disorders  The patients weight, height, BMI have been recorded in the chart and visual acuity is per eye clinic.  I have made referrals, counseling and provided education to the patient based review of the above and I have provided the pt with a written personalized care plan for preventive services. Reviewed and updated provider list, see scanned forms.  See scanned forms.  Routine anticipatory guidance given to patient.  See health maintenance. Colon cancer screening 12/14 colonoscopy  Breast cancer screening mammogram 1/20 Self breast exam- cannot do well/due to general lumpiness  Gyn care (h/o endometrial cancer ) Had a pap nov 19 at the cancer center (had to postpone her oncology check for next month) Flu vaccine 10/19 Tetanus vaccine 6/11 Pneumovax completed series  Zoster vaccine 8/14 zostavax  dexa 5/19 -osteopenia ( lowest t score fn is -2.4) -no significant change She completed a course of fosamax 10/19 Falls- none since last time (tripped over husband's towels on the floor) Fractures foot, ribs  Supplements -vit D (D level 48)  Advance directive-has a living will and POA  Cognitive function addressed- see scanned forms- and if abnormal then additional documentation follows.  No concerns  She published a book   PMH and SH reviewed  Meds, vitals, and  allergies reviewed.   ROS: See HPI.  Otherwise negative.   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 And writing another one    She is tearful at times Is a positive person in general however    Weight Wt Readings from Last 3 Encounters:  02/10/19 104 lb 7 oz (47.4 kg)  12/08/18 102 lb (46.3 kg)  09/23/18 106 lb 12 oz (48.4 kg)  stable  Walks every am early  18.80 kg/m   Hearing/vision  Hearing Screening   125Hz 250Hz 500Hz 1000Hz 2000Hz 3000Hz 4000Hz 6000Hz 8000Hz  Right ear:           Left ear:           Comments: Pt wears hearing aids  Vision Screening Comments: Pt had [facility] exam yearly at [facility]   H/o homocysteinemia  Takes methylfolate   Cholesterol Lab Results  Component Value Date   CHOL 207 (H) 02/06/2019   CHOL 188 10/29/2017   CHOL 199 10/25/2016   Lab Results  Component Value Date   HDL 81.50 02/06/2019   HDL 77.00 10/29/2017   HDL 78.70 10/25/2016   Lab Results  Component Value Date   LDLCALC 107 (H) 02/06/2019   [place] 93  10/29/2017   LDLCALC 102 (H) 10/25/2016   Lab Results  Component Value Date   TRIG 93.0 02/06/2019   TRIG 88.0 10/29/2017   TRIG 93.0 10/25/2016   Lab Results  Component Value Date   CHOLHDL 3 02/06/2019   CHOLHDL 2 10/29/2017   CHOLHDL 3 10/25/2016   Lab Results  Component Value Date   LDLDIRECT 119.6 03/04/2013  walking  Eats healthy- does very well with that Now she eats at son's house- vegan diet   Other labs Results for orders placed or performed in visit on 02/06/19  VITAMIN D 25 Hydroxy (Vit-D Deficiency, Fractures)  Result Value Ref Range   VITD 48.19 30.00 - 100.00 ng/mL  TSH  Result Value Ref Range   TSH 1.96 0.35 - 4.50 uIU/mL  Lipid panel  Result Value Ref  Range   Cholesterol 207 (H) 0 - 200 mg/dL   Triglycerides 93.0 0.0 - 149.0 mg/dL   HDL 81.50 >39.00 mg/dL   VLDL 18.6 0.0 - 40.0 mg/dL   LDL Cholesterol 107 (H) 0 - 99 mg/dL   Total CHOL/HDL Ratio 3    NonHDL 125.60   Comprehensive metabolic panel  Result Value Ref Range   Sodium 137 135 - 145 mEq/L   Potassium 4.1 3.5 - 5.1 mEq/L   Chloride 101 96 - 112 mEq/L   CO2 29 19 - 32 mEq/L   Glucose, Bld 95 70 - 99 mg/dL   BUN 13 6 - 23 mg/dL   Creatinine, Ser 0.65 0.40 - 1.20 mg/dL   Total Bilirubin 0.5 0.2 - 1.2 mg/dL   Alkaline Phosphatase 57 39 - 117 U/L   AST 24 0 - 37 U/L   ALT 17 0 - 35 U/L   Total Protein 6.9 6.0 - 8.3 g/dL   Albumin 4.3 3.5 - 5.2 g/dL   Calcium 9.4 8.4 - 10.5 mg/dL   GFR 88.86 >60.00 mL/min  CBC with Differential/Platelet  Result Value Ref Range   WBC 4.4 4.0 - 10.5 K/uL   RBC 3.66 (L) 3.87 - 5.11 Mil/uL   Hemoglobin 12.3 12.0 - 15.0 g/dL   HCT 36.7 36.0 - 46.0 %   MCV 100.2 (H) 78.0 - 100.0 fl   MCHC 33.4 30.0 - 36.0 g/dL   RDW 13.3 11.5 - 15.5 %   Platelets 159.0 150.0 - 400.0 K/uL   Neutrophils Relative % 61.1 43.0 - 77.0 %   Lymphocytes Relative 26.2 12.0 - 46.0 %   Monocytes Relative 9.7 3.0 - 12.0 %   Eosinophils Relative 2.3 0.0 - 5.0 %   Basophils Relative 0.7 0.0 - 3.0 %   Neutro Abs 2.7 1.4 - 7.7 K/uL   Lymphs Abs 1.2 0.7 - 4.0 K/uL   Monocytes Absolute 0.4 0.1 - 1.0 K/uL   Eosinophils Absolute 0.1 0.0 - 0.7 K/uL   Basophils Absolute 0.0 0.0 - 0.1 K/uL      Seeing Pervis Hocking for counseling every 2 weeks    Patient Active Problem List   Diagnosis Date Noted   Hip pain, bilateral 09/19/2018   Stress reaction 05/31/2018   Headache 12/06/2017   Dyspepsia 12/06/2017   Foot fracture 07/10/2016   Homocysteinemia ([place]) 05/09/2016   Estrogen deficiency 07/21/2015   Ashkenazi Jewish ancestry    Endometrial cancer ([place]) 11/03/2013   History of uterine cancer 10/28/2013   CIS (carcinoma in situ of cervix) 09/23/2013    Encounter for Medicare annual wellness exam 03/12/2013   Colon cancer screening 03/12/2013   Routine  general medical examination at a health care facility 03/03/2013   Encounter for medication monitoring 11/23/2010   SEBORRHEIC KERATOSIS 08/25/2009   SLEEP DISORDER 12/09/2008   HEMORRHOIDS 10/12/2008   Osteoporosis 10/08/2007   FIBROCYSTIC BREAST DISEASE 06/21/2007   PSORIASIS 06/21/2007   URTICARIA 06/21/2007   Past Medical History:  Diagnosis Date   Anemia    Anxiety    Ashkenazi Jewish ancestry    Diffuse cystic mastopathy    Disorder of bone and cartilage, unspecified    Dysautonomia (HCC)    mild   Endometrial cancer ([place])    Headache(784.0)    History of shingles    Hx of basal cell carcinoma    face   Osteoporosis    Other malaise and fatigue    Other psoriasis    Other seborrheic keratosis    Pain in limb    Predominant disturbance of emotions    Radiation 01/06/14, 12/23/13, 12/16/13, 12/09/13   HDR brachytherapy   Sleep disturbance, unspecified    Unspecified hemorrhoids without mention of complication    Urticaria, unspecified    Past Surgical History:  Procedure Laterality Date   AXILLARY LYMPH NODE DISSECTION     left   birthmark removal  childhood   BREAST BIOPSY Left 2001   benign   BREAST EXCISIONAL BIOPSY Left    benign   BREAST LUMPECTOMY     left underarm   cervical cancer  1973   CERVICAL CONIZATION W/BX     COLONOSCOPY  12/04   int. hemorrhoids   DEXA  03/2002   oseopenia   DEXA  07/2004   decreased BMD, osteopenia   DEXA  02/2007   Osteopenia   DEXA  9/10   Osteopenia slightly worse   felon I&D  12/2010   Dr. Fredna Dow, I&D in OR (L index finger)   FINGER SURGERY     KNEE SURGERY     right   ROBOTIC ASSISTED TOTAL HYSTERECTOMY WITH BILATERAL SALPINGO OOPHERECTOMY  10/03/2013   Robotic-assisted total laparoscopic hysterectomy, bilateral salpingo-oophorectomy, bilateral pelvic and para-aortic  lymphadenectomy with sentinel lymph node dissection   TONSILECTOMY, ADENOIDECTOMY, BILATERAL MYRINGOTOMY AND TUBES     TUBAL LIGATION     Social History   Tobacco Use   Smoking status: Former Smoker    Quit date: 07/25/1963    Years since quitting: 55.5   Smokeless tobacco: Never Used  Substance Use Topics   Alcohol use: Yes    Alcohol/week: 1.0 standard drinks    Types: 1 Glasses of wine per week    Comment: 1/2 glass of wine a day   Drug use: No   Family History  Problem Relation Age of Onset   Multiple myeloma Father    Coronary artery disease Father    Transient ischemic attack Mother    Multiple sclerosis Brother    Lupus Maternal Aunt    Breast cancer Maternal Aunt        dx 49s; deceased   Cancer Maternal Grandfather        GI cancer or pancreatic; deceased 28   Ovarian cancer Maternal Aunt        dx 59s; deceased 24s   Colon cancer Neg Hx    Allergies  Allergen Reactions   Tramadol     Nausea and vomiting    Bactrim [Sulfamethoxazole-Trimethoprim] Rash   Benadryl [Diphenhydramine Hcl (Sleep)] Rash and Anxiety    Fever, body ache   Oxycodone-Acetaminophen Anxiety   Sulfa Antibiotics Rash   Current  Outpatient Medications on File Prior to Visit  Medication Sig Dispense Refill   Ascorbic Acid (VITAMIN C PO) Take 1 tablet by mouth daily.     cholecalciferol (VITAMIN D) 400 UNITS TABS Take 400 Units by mouth daily.      co-enzyme Q-10 30 MG capsule Take 30 mg by mouth daily.      fish oil-omega-3 fatty acids 1000 MG capsule Take by mouth daily.      ibuprofen (ADVIL,MOTRIN) 200 MG tablet Take 200 mg by mouth as needed for pain. Reported on 12/15/2015     L-Methylfolate 7.5 MG TABS Take 1 tablet (7.5 mg total) by mouth daily. 30 tablet 11   Lactobacillus (CVS PROBIOTIC ACIDOPHILUS) 10 MG CAPS Take 1 capsule by mouth daily.      Magnesium 250 MG TABS Take 1 tablet by mouth daily.     Multiple Minerals-Vitamins (CALCIUM-MAGNESIUM-ZINC-D3  PO) Take 2 capsules by mouth daily.     Multiple Vitamin (MULTIVITAMIN) tablet Take 1 tablet by mouth daily.     ondansetron (ZOFRAN) 8 MG tablet Take 1 tablet (8 mg total) by mouth every 8 (eight) hours as needed for nausea or vomiting. 30 tablet 0   methocarbamol (ROBAXIN) 500 MG tablet Take 1 tablet (500 mg total) by mouth at bedtime as needed for muscle spasms. (Patient not taking: Reported on 02/10/2019) 30 tablet 0   No current facility-administered medications on file prior to visit.     Review of Systems  Constitutional: Positive for fatigue. Negative for activity change, appetite change, fever and unexpected weight change.       Fatigue from stress  HENT: Negative for congestion, ear pain, rhinorrhea, sinus pressure and sore throat.   Eyes: Negative for pain, redness and visual disturbance.  Respiratory: Negative for cough, shortness of breath and wheezing.   Cardiovascular: Negative for chest pain and palpitations.  Gastrointestinal: Negative for abdominal pain, blood in stool, constipation and diarrhea.  Endocrine: Negative for polydipsia and polyuria.  Genitourinary: Negative for dysuria, frequency and urgency.  Musculoskeletal: Negative for arthralgias, back pain and myalgias.  Skin: Negative for pallor and rash.  Allergic/Immunologic: Negative for environmental allergies.  Neurological: Negative for dizziness, syncope and headaches.  Hematological: Negative for adenopathy. Does not bruise/bleed easily.  Psychiatric/Behavioral: Negative for decreased concentration and dysphoric mood. The patient is not nervous/anxious.        Severely stressed Husband with severe mental health issue - looking to rectify this  Still seeing counselor        Objective:   Physical Exam Constitutional:      General: She is not in acute distress.    Appearance: Normal appearance. She is well-developed and normal weight. She is not ill-appearing or diaphoretic.     Comments: Slim  /underweight baseline  HENT:     Head: Normocephalic and atraumatic.     Right Ear: Tympanic membrane, ear canal and external ear normal.     Left Ear: Tympanic membrane, ear canal and external ear normal.     Nose: Nose normal.     Mouth/Throat:     Mouth: Mucous membranes are moist.     Pharynx: Oropharynx is clear. No posterior oropharyngeal erythema.  Eyes:     General: No scleral icterus.    Conjunctiva/sclera: Conjunctivae normal.     Pupils: Pupils are equal, round, and reactive to light.  Neck:     Musculoskeletal: Normal range of motion and neck supple. No neck rigidity or muscular tenderness.  Thyroid: No thyromegaly.     Vascular: No carotid bruit or JVD.  Cardiovascular:     Rate and Rhythm: Normal rate and regular rhythm.     Pulses: Normal pulses.     Heart sounds: Normal heart sounds. No gallop.   Pulmonary:     Effort: Pulmonary effort is normal. No respiratory distress.     Breath sounds: Normal breath sounds. No wheezing or rales.     Comments: Good air exch Chest:     Chest wall: No tenderness.  Abdominal:     General: Bowel sounds are normal. There is no distension or abdominal bruit.     Palpations: Abdomen is soft. There is no mass.     Tenderness: There is no abdominal tenderness. There is no guarding or rebound.     Hernia: No hernia is present.  Genitourinary:    Comments: Breast exam: No mass, nodules, thickening, tenderness, bulging, retraction, inflamation, nipple discharge or skin changes noted.  No axillary or clavicular LA.     Musculoskeletal: Normal range of motion.        General: No tenderness.  Lymphadenopathy:     Cervical: No cervical adenopathy.  Skin:    General: Skin is warm and dry.     Coloration: Skin is not pale.     Findings: No erythema or rash.     Comments: Solar lentigines diffusely Some brown nevi  Neurological:     Mental Status: She is alert.     Cranial Nerves: No cranial nerve deficit.     Motor: No weakness or  abnormal muscle tone.     Coordination: Coordination normal.     Gait: Gait normal.     Deep Tendon Reflexes: Reflexes are normal and symmetric. Reflexes normal.  Psychiatric:        Speech: Speech normal.        Behavior: Behavior normal.        Cognition and Memory: Cognition and memory normal.     Comments: Seems fatigued and stressed  Not distressed or tearful  Mentally sharp           Assessment & Plan:   Problem List Items Addressed This Visit      Musculoskeletal and Integument   Osteoporosis     Genitourinary   Endometrial cancer ([place])    Continues to do well with oncology and gyn f/u        Other   Routine general medical examination at a health care facility    Reviewed health habits including diet and exercise and skin cancer prevention Reviewed appropriate screening tests for age  Also reviewed health mt list, fam hx and immunization status , as well as social and family history   See HPI Labs reviewed  Gyn care reviewed  Disc zostavax if interested  On drug holiday from fosamax currently / good D level Has adv directive No cognitive concerns  Hearing aides / vision screen utd  Disc stress reaction (husband with mental health issues) - enc her to continue counseling      Encounter for Medicare annual wellness exam - Primary    Reviewed health habits including diet and exercise and skin cancer prevention Reviewed appropriate screening tests for age  Also reviewed health mt list, fam hx and immunization status , as well as social and family history   See HPI Labs reviewed  Gyn care reviewed  Disc zostavax if interested  On drug holiday from fosamax currently / good D level  Has adv directive No cognitive concerns  Hearing aides / vision screen utd        Homocysteinemia ([place])    Continues folate supplementation

## 2019-02-10 NOTE — Assessment & Plan Note (Signed)
Continues folate supplementation

## 2019-02-10 NOTE — Assessment & Plan Note (Signed)
Reviewed health habits including diet and exercise and skin cancer prevention Reviewed appropriate screening tests for age  Also reviewed health mt list, fam hx and immunization status , as well as social and family history   See HPI Labs reviewed  Gyn care reviewed  Disc zostavax if interested  On drug holiday from fosamax currently / good D level Has adv directive No cognitive concerns  Hearing aides / vision screen utd

## 2019-02-10 NOTE — Assessment & Plan Note (Signed)
Reviewed health habits including diet and exercise and skin cancer prevention Reviewed appropriate screening tests for age  Also reviewed health mt list, fam hx and immunization status , as well as social and family history   See HPI Labs reviewed  Gyn care reviewed  Disc zostavax if interested  On drug holiday from fosamax currently / good D level Has adv directive No cognitive concerns  Hearing aides / vision screen utd  Disc stress reaction (husband with mental health issues) - enc her to continue counseling

## 2019-02-18 ENCOUNTER — Ambulatory Visit (INDEPENDENT_AMBULATORY_CARE_PROVIDER_SITE_OTHER): Payer: Medicare Other | Admitting: Psychology

## 2019-02-18 DIAGNOSIS — F4323 Adjustment disorder with mixed anxiety and depressed mood: Secondary | ICD-10-CM | POA: Diagnosis not present

## 2019-03-04 ENCOUNTER — Ambulatory Visit: Payer: Medicare Other | Admitting: Psychology

## 2019-03-04 ENCOUNTER — Ambulatory Visit (INDEPENDENT_AMBULATORY_CARE_PROVIDER_SITE_OTHER): Payer: Medicare Other | Admitting: Psychology

## 2019-03-04 DIAGNOSIS — F4323 Adjustment disorder with mixed anxiety and depressed mood: Secondary | ICD-10-CM

## 2019-03-06 ENCOUNTER — Other Ambulatory Visit: Payer: Self-pay

## 2019-03-06 ENCOUNTER — Ambulatory Visit
Admission: RE | Admit: 2019-03-06 | Discharge: 2019-03-06 | Disposition: A | Discharge status: Home / Self Care | Payer: Medicare Other | Source: Ambulatory Visit | Attending: Radiation Oncology | Admitting: Radiation Oncology

## 2019-03-06 ENCOUNTER — Encounter: Payer: Self-pay | Admitting: Radiation Oncology

## 2019-03-06 VITALS — BP 116/65 | HR 67 | Temp 98.5°F | Resp 18 | Ht 62.5 in | Wt 105.0 lb

## 2019-03-06 DIAGNOSIS — Z853 Personal history of malignant neoplasm of breast: Secondary | ICD-10-CM | POA: Insufficient documentation

## 2019-03-06 DIAGNOSIS — Z79899 Other long term (current) drug therapy: Secondary | ICD-10-CM | POA: Diagnosis not present

## 2019-03-06 DIAGNOSIS — Z923 Personal history of irradiation: Secondary | ICD-10-CM | POA: Diagnosis not present

## 2019-03-06 DIAGNOSIS — C541 Malignant neoplasm of endometrium: Secondary | ICD-10-CM

## 2019-03-06 NOTE — Progress Notes (Signed)
Radiation Oncology         [phone] ________________________________  Name: Veronica Ward MRN: [id number]  Date: 03/06/2019  DOB: 1945/08/10  Follow-Up Visit Note  CC: Tower, Wynelle Fanny, MD  Marti Sleigh,*    ICD-10-CM   1. Endometrial cancer [facility])  C54.1     Diagnosis:   73 y.o. female with Endometrial cancer, serous carcinoma, stage IA  Interval Since Last Radiation:  5 years 2 months   Radiation treatment dates:   May 19, May 26, June 2, June 9, January 06, 2014  Site/dose:   Proximal vagina / 30 gray in 5 fractions, 4.0 cm treatment length  Narrative:  The patient returns today for routine follow-up.  Her last Pap smear was done on 06/04/2018 and was negative; there were benign reactive changes associated with radiation effect.                           On review of systems, the patient denies any pain. She denies dysuria or hematuria. She denies vaginal bleeding or discharge. She denies rectal bleeding, diarrhea, or constipation. She reports having occasional sexual intercourse and is not using vaginal dilator.   The patient recently moved to the [facility] area.   ALLERGIES:  is allergic to tramadol; bactrim [sulfamethoxazole-trimethoprim]; benadryl [diphenhydramine hcl (sleep)]; oxycodone-acetaminophen; and sulfa antibiotics.  Meds: Current Outpatient Medications  Medication Sig Dispense Refill  . Ascorbic Acid (VITAMIN C PO) Take 1 tablet by mouth daily.    . calcium carbonate (OS-CAL - DOSED IN MG OF ELEMENTAL CALCIUM) 1250 (500 Ca) MG tablet Take 2 tablets by mouth daily with breakfast.    . cholecalciferol (VITAMIN D) 400 UNITS TABS Take 400 Units by mouth daily.     . fish oil-omega-3 fatty acids 1000 MG capsule Take by mouth daily.     Marland Kitchen gabapentin (NEURONTIN) 100 MG capsule Take one capsule three times daily as needed for headache 1 capsule 0  . ibuprofen (ADVIL,MOTRIN) 200 MG tablet Take 200 mg by mouth as needed for pain. Reported on 12/15/2015    .  L-Methylfolate 7.5 MG TABS Take 1 tablet (7.5 mg total) by mouth daily. 30 tablet 11  . Lactobacillus (CVS PROBIOTIC ACIDOPHILUS) 10 MG CAPS Take 1 capsule by mouth daily.     . Magnesium 250 MG TABS Take 1 tablet by mouth daily.    . Multiple Vitamin (MULTIVITAMIN) tablet Take 1 tablet by mouth daily.    . ondansetron (ZOFRAN) 8 MG tablet Take 1 tablet (8 mg total) by mouth every 8 (eight) hours as needed for nausea or vomiting. 30 tablet 0  . co-enzyme Q-10 30 MG capsule Take 30 mg by mouth daily.     . methocarbamol (ROBAXIN) 500 MG tablet Take 1 tablet (500 mg total) by mouth at bedtime as needed for muscle spasms. (Patient not taking: Reported on 02/10/2019) 30 tablet 0  . Multiple Minerals-Vitamins (CALCIUM-MAGNESIUM-ZINC-D3 PO) Take 2 capsules by mouth daily.     No current facility-administered medications for this encounter.     Physical Findings: The patient is in no acute distress. Patient is alert and oriented.  height is 5' 2.5" (1.588 m) and weight is 105 lb (47.6 kg). Her temporal temperature is 98.5 F (36.9 C). Her blood pressure is 116/65 and her pulse is 67. Her respiration is 18 and oxygen saturation is 100%.   Lungs are clear to auscultation bilaterally. Heart has regular rate and rhythm. No  palpable cervical, supraclavicular, or axillary adenopathy. Abdomen soft, non-tender, normal bowel sounds.  On pelvic examination the external genitalia were unremarkable. A speculum exam was performed. There are no mucosal lesions noted in the vaginal vault. On bimanual examination there were no pelvic masses appreciated. The vaginal cuff is intact.  Lab Findings: Lab Results  Component Value Date   WBC 4.4 02/06/2019   HGB 12.3 02/06/2019   HCT 36.7 02/06/2019   MCV 100.2 (H) 02/06/2019   PLT 159.0 02/06/2019    Radiographic Findings: No results found.  Impression:  Endometrial cancer, serous carcinoma, stage IA. No evidence of recurrence on clinical exam.   Plan:  I  congratulated the patient on successful 5 year follow-up. Discussed that she can now return to primary gynecologist and will see if Dr. Denman George has any recommendations for gynecologists in the [facility] area.   ____________________________________  Blair Promise, PhD, MD  This document serves as a record of services personally performed by Gery Pray, MD. It was created on his behalf by Rae Lips, a trained medical scribe. The creation of this record is based on the scribe's personal observations and the provider's statements to them. This document has been checked and approved by the attending provider.

## 2019-03-06 NOTE — Patient Instructions (Signed)
Coronavirus (COVID-19) Are you at risk?  Are you at risk for the Coronavirus (COVID-19)?  To be considered HIGH RISK for Coronavirus (COVID-19), you have to meet the following criteria:  . Traveled to [place], [place], [place], [place] or [place]; or in the [place] to [place], [place], [place], or [place]; and have fever, cough, and shortness of breath within the last 2 weeks of travel OR . Been in close contact with a person diagnosed with COVID-19 within the last 2 weeks and have fever, cough, and shortness of breath . IF YOU DO NOT MEET THESE CRITERIA, YOU ARE CONSIDERED LOW RISK FOR COVID-19.  What to do if you are HIGH RISK for COVID-19?  . If you are having a medical emergency, call 911. . Seek medical care right away. Before you go to a doctor's office, urgent care or emergency department, call ahead and tell them about your recent travel, contact with someone diagnosed with COVID-19, and your symptoms. You should receive instructions from your physician's office regarding next steps of care.  . When you arrive at healthcare provider, tell the healthcare staff immediately you have returned from visiting [place], [place], [place], [place] or [place]; or traveled in the [place] to [place], [place], [place], or [place]; in the last two weeks or you have been in close contact with a person diagnosed with COVID-19 in the last 2 weeks.   . Tell the health care staff about your symptoms: fever, cough and shortness of breath. . After you have been seen by a medical provider, you will be either: o Tested for (COVID-19) and discharged home on quarantine except to seek medical care if symptoms worsen, and asked to  - Stay home and avoid contact with others until you get your results (4-5 days)  - Avoid travel on public transportation if possible (such as bus, train, or airplane) or o Sent to the Emergency Department by EMS for evaluation, COVID-19 testing, and possible  admission depending on your condition and test results.  What to do if you are LOW RISK for COVID-19?  Reduce your risk of any infection by using the same precautions used for avoiding the common cold or flu:  . Wash your hands often with soap and warm water for at least 20 seconds.  If soap and water are not readily available, use an alcohol-based hand sanitizer with at least 60% alcohol.  . If coughing or sneezing, cover your mouth and nose by coughing or sneezing into the elbow areas of your shirt or coat, into a tissue or into your sleeve (not your hands). . Avoid shaking hands with others and consider head nods or verbal greetings only. . Avoid touching your eyes, nose, or mouth with unwashed hands.  . Avoid close contact with people who are sick. . Avoid places or events with large numbers of people in one location, like concerts or sporting events. . Carefully consider travel plans you have or are making. . If you are planning any travel outside or inside the [place], visit the CDC's Travelers' Health webpage for the latest health notices. . If you have some symptoms but not all symptoms, continue to monitor at home and seek medical attention if your symptoms worsen. . If you are having a medical emergency, call 911.   ADDITIONAL HEALTHCARE OPTIONS FOR PATIENTS  [facility] / e-Visit: [email or web address]         [facility]: [phone]  [facility]: [phone]                   [facility]: [phone]   

## 2019-03-06 NOTE — Progress Notes (Signed)
Pt presents today for f/u with Dr. Sondra Come. Pt denies c/o pain. Pt denies dysuria/hematuria. Pt denies vaginal bleeding/discharge. Pt denies rectal bleeding, diarrhea/constipation. Pt reports having occasional sexual intercourse and is not using vaginal dilator.   BP 116/65 (BP Location: Left Arm, Patient Position: Sitting)   Pulse 67   Temp 98.5 F (36.9 C) (Temporal)   Resp 18   Ht 5' 2.5" (1.588 m)   Wt 105 lb (47.6 kg)   LMP 09/17/2000   SpO2 100%   BMI 18.90 kg/m   Wt Readings from Last 3 Encounters:  03/06/19 105 lb (47.6 kg)  02/10/19 104 lb 7 oz (47.4 kg)  12/08/18 102 lb (46.3 kg)   Loma Sousa, RN BSN

## 2019-03-17 ENCOUNTER — Telehealth: Payer: Self-pay | Admitting: Oncology

## 2019-03-17 NOTE — Telephone Encounter (Signed)
Called Ayvree and let her know that [facility] recommends Dr. Lahoma Crocker for a Gyn in [place].

## 2019-03-18 ENCOUNTER — Ambulatory Visit (INDEPENDENT_AMBULATORY_CARE_PROVIDER_SITE_OTHER): Payer: Medicare Other | Admitting: Psychology

## 2019-03-18 DIAGNOSIS — F4323 Adjustment disorder with mixed anxiety and depressed mood: Secondary | ICD-10-CM

## 2019-04-01 ENCOUNTER — Ambulatory Visit (INDEPENDENT_AMBULATORY_CARE_PROVIDER_SITE_OTHER): Payer: Medicare Other | Admitting: Psychology

## 2019-04-01 DIAGNOSIS — F4323 Adjustment disorder with mixed anxiety and depressed mood: Secondary | ICD-10-CM | POA: Diagnosis not present

## 2019-04-15 ENCOUNTER — Ambulatory Visit (INDEPENDENT_AMBULATORY_CARE_PROVIDER_SITE_OTHER): Payer: Medicare Other | Admitting: Psychology

## 2019-04-15 DIAGNOSIS — F4323 Adjustment disorder with mixed anxiety and depressed mood: Secondary | ICD-10-CM

## 2019-04-29 ENCOUNTER — Ambulatory Visit (INDEPENDENT_AMBULATORY_CARE_PROVIDER_SITE_OTHER): Payer: Medicare Other | Admitting: Psychology

## 2019-04-29 DIAGNOSIS — F4323 Adjustment disorder with mixed anxiety and depressed mood: Secondary | ICD-10-CM

## 2019-05-13 ENCOUNTER — Ambulatory Visit: Payer: Medicare Other | Admitting: Psychology

## 2019-05-20 ENCOUNTER — Ambulatory Visit (INDEPENDENT_AMBULATORY_CARE_PROVIDER_SITE_OTHER): Payer: Medicare Other | Admitting: Psychology

## 2019-05-20 DIAGNOSIS — F4323 Adjustment disorder with mixed anxiety and depressed mood: Secondary | ICD-10-CM | POA: Diagnosis not present

## 2019-05-27 ENCOUNTER — Ambulatory Visit: Payer: Medicare Other | Admitting: Psychology

## 2019-06-03 ENCOUNTER — Ambulatory Visit (INDEPENDENT_AMBULATORY_CARE_PROVIDER_SITE_OTHER): Payer: Medicare Other | Admitting: Psychology

## 2019-06-03 DIAGNOSIS — F4323 Adjustment disorder with mixed anxiety and depressed mood: Secondary | ICD-10-CM | POA: Diagnosis not present

## 2019-06-10 ENCOUNTER — Ambulatory Visit: Payer: Medicare Other | Admitting: Psychology

## 2019-06-17 ENCOUNTER — Ambulatory Visit (INDEPENDENT_AMBULATORY_CARE_PROVIDER_SITE_OTHER): Payer: Medicare Other | Admitting: Psychology

## 2019-06-17 DIAGNOSIS — F4323 Adjustment disorder with mixed anxiety and depressed mood: Secondary | ICD-10-CM

## 2019-06-24 ENCOUNTER — Ambulatory Visit (INDEPENDENT_AMBULATORY_CARE_PROVIDER_SITE_OTHER): Payer: Medicare Other | Admitting: Psychology

## 2019-06-24 DIAGNOSIS — F4323 Adjustment disorder with mixed anxiety and depressed mood: Secondary | ICD-10-CM

## 2019-07-08 ENCOUNTER — Ambulatory Visit (INDEPENDENT_AMBULATORY_CARE_PROVIDER_SITE_OTHER): Payer: Medicare Other | Admitting: Psychology

## 2019-07-08 DIAGNOSIS — F4323 Adjustment disorder with mixed anxiety and depressed mood: Secondary | ICD-10-CM

## 2019-07-22 ENCOUNTER — Ambulatory Visit: Payer: Medicare Other | Admitting: Psychology

## 2019-08-05 ENCOUNTER — Ambulatory Visit: Payer: Medicare PPO | Admitting: Psychology

## 2019-08-19 ENCOUNTER — Ambulatory Visit (INDEPENDENT_AMBULATORY_CARE_PROVIDER_SITE_OTHER): Payer: Medicare PPO | Admitting: Psychology

## 2019-08-19 DIAGNOSIS — F4323 Adjustment disorder with mixed anxiety and depressed mood: Secondary | ICD-10-CM

## 2019-08-20 DIAGNOSIS — M810 Age-related osteoporosis without current pathological fracture: Secondary | ICD-10-CM | POA: Diagnosis not present

## 2019-08-20 DIAGNOSIS — G479 Sleep disorder, unspecified: Secondary | ICD-10-CM | POA: Diagnosis not present

## 2019-08-20 DIAGNOSIS — F329 Major depressive disorder, single episode, unspecified: Secondary | ICD-10-CM | POA: Diagnosis not present

## 2019-09-02 ENCOUNTER — Ambulatory Visit (INDEPENDENT_AMBULATORY_CARE_PROVIDER_SITE_OTHER): Payer: Medicare PPO | Admitting: Psychology

## 2019-09-02 DIAGNOSIS — F4323 Adjustment disorder with mixed anxiety and depressed mood: Secondary | ICD-10-CM

## 2019-09-04 DIAGNOSIS — Z1231 Encounter for screening mammogram for malignant neoplasm of breast: Secondary | ICD-10-CM | POA: Diagnosis not present

## 2019-09-16 ENCOUNTER — Ambulatory Visit (INDEPENDENT_AMBULATORY_CARE_PROVIDER_SITE_OTHER): Payer: Medicare PPO | Admitting: Psychology

## 2019-09-16 DIAGNOSIS — F4323 Adjustment disorder with mixed anxiety and depressed mood: Secondary | ICD-10-CM

## 2019-09-30 ENCOUNTER — Ambulatory Visit (INDEPENDENT_AMBULATORY_CARE_PROVIDER_SITE_OTHER): Payer: Medicare PPO | Admitting: Psychology

## 2019-09-30 DIAGNOSIS — F4323 Adjustment disorder with mixed anxiety and depressed mood: Secondary | ICD-10-CM

## 2019-10-14 ENCOUNTER — Ambulatory Visit: Payer: Medicare PPO | Admitting: Psychology

## 2019-10-28 ENCOUNTER — Ambulatory Visit (INDEPENDENT_AMBULATORY_CARE_PROVIDER_SITE_OTHER): Payer: Medicare PPO | Admitting: Psychology

## 2019-10-28 DIAGNOSIS — F4323 Adjustment disorder with mixed anxiety and depressed mood: Secondary | ICD-10-CM | POA: Diagnosis not present

## 2019-11-11 ENCOUNTER — Ambulatory Visit (INDEPENDENT_AMBULATORY_CARE_PROVIDER_SITE_OTHER): Payer: Medicare PPO | Admitting: Psychology

## 2019-11-11 DIAGNOSIS — F4323 Adjustment disorder with mixed anxiety and depressed mood: Secondary | ICD-10-CM

## 2019-11-19 DIAGNOSIS — Z1211 Encounter for screening for malignant neoplasm of colon: Secondary | ICD-10-CM | POA: Diagnosis not present

## 2019-11-19 DIAGNOSIS — Z681 Body mass index (BMI) 19 or less, adult: Secondary | ICD-10-CM | POA: Diagnosis not present

## 2019-11-19 DIAGNOSIS — E785 Hyperlipidemia, unspecified: Secondary | ICD-10-CM | POA: Diagnosis not present

## 2019-11-19 DIAGNOSIS — F329 Major depressive disorder, single episode, unspecified: Secondary | ICD-10-CM | POA: Diagnosis not present

## 2019-11-19 DIAGNOSIS — G479 Sleep disorder, unspecified: Secondary | ICD-10-CM | POA: Diagnosis not present

## 2019-11-25 ENCOUNTER — Ambulatory Visit (INDEPENDENT_AMBULATORY_CARE_PROVIDER_SITE_OTHER): Payer: Medicare PPO | Admitting: Psychology

## 2019-11-25 DIAGNOSIS — F4323 Adjustment disorder with mixed anxiety and depressed mood: Secondary | ICD-10-CM | POA: Diagnosis not present

## 2019-12-09 ENCOUNTER — Ambulatory Visit (INDEPENDENT_AMBULATORY_CARE_PROVIDER_SITE_OTHER): Payer: Medicare PPO | Admitting: Psychology

## 2019-12-09 DIAGNOSIS — F4323 Adjustment disorder with mixed anxiety and depressed mood: Secondary | ICD-10-CM

## 2019-12-23 ENCOUNTER — Ambulatory Visit (INDEPENDENT_AMBULATORY_CARE_PROVIDER_SITE_OTHER): Payer: Medicare PPO | Admitting: Psychology

## 2019-12-23 DIAGNOSIS — F4323 Adjustment disorder with mixed anxiety and depressed mood: Secondary | ICD-10-CM

## 2020-01-06 ENCOUNTER — Ambulatory Visit (INDEPENDENT_AMBULATORY_CARE_PROVIDER_SITE_OTHER): Payer: Medicare PPO | Admitting: Psychology

## 2020-01-06 DIAGNOSIS — F4323 Adjustment disorder with mixed anxiety and depressed mood: Secondary | ICD-10-CM

## 2020-01-15 DIAGNOSIS — Z780 Asymptomatic menopausal state: Secondary | ICD-10-CM | POA: Diagnosis not present

## 2020-01-15 DIAGNOSIS — G479 Sleep disorder, unspecified: Secondary | ICD-10-CM | POA: Diagnosis not present

## 2020-01-15 DIAGNOSIS — M85852 Other specified disorders of bone density and structure, left thigh: Secondary | ICD-10-CM | POA: Diagnosis not present

## 2020-01-15 DIAGNOSIS — M85862 Other specified disorders of bone density and structure, left lower leg: Secondary | ICD-10-CM | POA: Diagnosis not present

## 2020-01-15 DIAGNOSIS — R140 Abdominal distension (gaseous): Secondary | ICD-10-CM | POA: Diagnosis not present

## 2020-01-15 DIAGNOSIS — F329 Major depressive disorder, single episode, unspecified: Secondary | ICD-10-CM | POA: Diagnosis not present

## 2020-01-15 DIAGNOSIS — Z681 Body mass index (BMI) 19 or less, adult: Secondary | ICD-10-CM | POA: Diagnosis not present

## 2020-01-15 DIAGNOSIS — M810 Age-related osteoporosis without current pathological fracture: Secondary | ICD-10-CM | POA: Diagnosis not present

## 2020-01-15 DIAGNOSIS — R39198 Other difficulties with micturition: Secondary | ICD-10-CM | POA: Diagnosis not present

## 2020-01-17 DIAGNOSIS — M25511 Pain in right shoulder: Secondary | ICD-10-CM | POA: Diagnosis not present

## 2020-01-17 DIAGNOSIS — S42291A Other displaced fracture of upper end of right humerus, initial encounter for closed fracture: Secondary | ICD-10-CM | POA: Diagnosis not present

## 2020-01-20 ENCOUNTER — Ambulatory Visit (INDEPENDENT_AMBULATORY_CARE_PROVIDER_SITE_OTHER): Payer: Medicare PPO | Admitting: Psychology

## 2020-01-20 DIAGNOSIS — M25511 Pain in right shoulder: Secondary | ICD-10-CM | POA: Diagnosis not present

## 2020-01-20 DIAGNOSIS — W010XXA Fall on same level from slipping, tripping and stumbling without subsequent striking against object, initial encounter: Secondary | ICD-10-CM | POA: Diagnosis not present

## 2020-01-20 DIAGNOSIS — S42291A Other displaced fracture of upper end of right humerus, initial encounter for closed fracture: Secondary | ICD-10-CM | POA: Diagnosis not present

## 2020-01-20 DIAGNOSIS — F4323 Adjustment disorder with mixed anxiety and depressed mood: Secondary | ICD-10-CM

## 2020-01-23 DIAGNOSIS — M810 Age-related osteoporosis without current pathological fracture: Secondary | ICD-10-CM | POA: Diagnosis not present

## 2020-01-25 DIAGNOSIS — S7002XA Contusion of left hip, initial encounter: Secondary | ICD-10-CM | POA: Diagnosis not present

## 2020-02-03 ENCOUNTER — Ambulatory Visit (INDEPENDENT_AMBULATORY_CARE_PROVIDER_SITE_OTHER): Payer: Medicare PPO | Admitting: Psychology

## 2020-02-03 DIAGNOSIS — F4323 Adjustment disorder with mixed anxiety and depressed mood: Secondary | ICD-10-CM | POA: Diagnosis not present

## 2020-02-03 DIAGNOSIS — S42291D Other displaced fracture of upper end of right humerus, subsequent encounter for fracture with routine healing: Secondary | ICD-10-CM | POA: Diagnosis not present

## 2020-02-11 DIAGNOSIS — W19XXXD Unspecified fall, subsequent encounter: Secondary | ICD-10-CM | POA: Diagnosis not present

## 2020-02-11 DIAGNOSIS — Z9181 History of falling: Secondary | ICD-10-CM | POA: Diagnosis not present

## 2020-02-11 DIAGNOSIS — M80021A Age-related osteoporosis with current pathological fracture, right humerus, initial encounter for fracture: Secondary | ICD-10-CM | POA: Diagnosis not present

## 2020-02-11 DIAGNOSIS — L409 Psoriasis, unspecified: Secondary | ICD-10-CM | POA: Diagnosis not present

## 2020-02-11 DIAGNOSIS — M1990 Unspecified osteoarthritis, unspecified site: Secondary | ICD-10-CM | POA: Diagnosis not present

## 2020-02-13 DIAGNOSIS — L409 Psoriasis, unspecified: Secondary | ICD-10-CM | POA: Diagnosis not present

## 2020-02-13 DIAGNOSIS — W19XXXD Unspecified fall, subsequent encounter: Secondary | ICD-10-CM | POA: Diagnosis not present

## 2020-02-13 DIAGNOSIS — Z9181 History of falling: Secondary | ICD-10-CM | POA: Diagnosis not present

## 2020-02-13 DIAGNOSIS — M1990 Unspecified osteoarthritis, unspecified site: Secondary | ICD-10-CM | POA: Diagnosis not present

## 2020-02-13 DIAGNOSIS — M80021A Age-related osteoporosis with current pathological fracture, right humerus, initial encounter for fracture: Secondary | ICD-10-CM | POA: Diagnosis not present

## 2020-02-16 DIAGNOSIS — Z9181 History of falling: Secondary | ICD-10-CM | POA: Diagnosis not present

## 2020-02-16 DIAGNOSIS — M1990 Unspecified osteoarthritis, unspecified site: Secondary | ICD-10-CM | POA: Diagnosis not present

## 2020-02-16 DIAGNOSIS — M80021A Age-related osteoporosis with current pathological fracture, right humerus, initial encounter for fracture: Secondary | ICD-10-CM | POA: Diagnosis not present

## 2020-02-16 DIAGNOSIS — L409 Psoriasis, unspecified: Secondary | ICD-10-CM | POA: Diagnosis not present

## 2020-02-16 DIAGNOSIS — W19XXXD Unspecified fall, subsequent encounter: Secondary | ICD-10-CM | POA: Diagnosis not present

## 2020-02-17 ENCOUNTER — Ambulatory Visit: Payer: Medicare PPO | Admitting: Psychology

## 2020-02-19 DIAGNOSIS — Z9181 History of falling: Secondary | ICD-10-CM | POA: Diagnosis not present

## 2020-02-19 DIAGNOSIS — R140 Abdominal distension (gaseous): Secondary | ICD-10-CM | POA: Diagnosis not present

## 2020-02-19 DIAGNOSIS — M80021A Age-related osteoporosis with current pathological fracture, right humerus, initial encounter for fracture: Secondary | ICD-10-CM | POA: Diagnosis not present

## 2020-02-19 DIAGNOSIS — M80011D Age-related osteoporosis with current pathological fracture, right shoulder, subsequent encounter for fracture with routine healing: Secondary | ICD-10-CM | POA: Diagnosis not present

## 2020-02-19 DIAGNOSIS — C541 Malignant neoplasm of endometrium: Secondary | ICD-10-CM | POA: Diagnosis not present

## 2020-02-19 DIAGNOSIS — Z1159 Encounter for screening for other viral diseases: Secondary | ICD-10-CM | POA: Diagnosis not present

## 2020-02-19 DIAGNOSIS — F329 Major depressive disorder, single episode, unspecified: Secondary | ICD-10-CM | POA: Diagnosis not present

## 2020-02-19 DIAGNOSIS — L409 Psoriasis, unspecified: Secondary | ICD-10-CM | POA: Diagnosis not present

## 2020-02-19 DIAGNOSIS — F341 Dysthymic disorder: Secondary | ICD-10-CM | POA: Diagnosis not present

## 2020-02-19 DIAGNOSIS — K644 Residual hemorrhoidal skin tags: Secondary | ICD-10-CM | POA: Diagnosis not present

## 2020-02-19 DIAGNOSIS — E785 Hyperlipidemia, unspecified: Secondary | ICD-10-CM | POA: Diagnosis not present

## 2020-02-19 DIAGNOSIS — E7849 Other hyperlipidemia: Secondary | ICD-10-CM | POA: Diagnosis not present

## 2020-02-19 DIAGNOSIS — Z131 Encounter for screening for diabetes mellitus: Secondary | ICD-10-CM | POA: Diagnosis not present

## 2020-02-19 DIAGNOSIS — M1990 Unspecified osteoarthritis, unspecified site: Secondary | ICD-10-CM | POA: Diagnosis not present

## 2020-02-19 DIAGNOSIS — W19XXXD Unspecified fall, subsequent encounter: Secondary | ICD-10-CM | POA: Diagnosis not present

## 2020-02-19 DIAGNOSIS — M810 Age-related osteoporosis without current pathological fracture: Secondary | ICD-10-CM | POA: Diagnosis not present

## 2020-02-19 DIAGNOSIS — K649 Unspecified hemorrhoids: Secondary | ICD-10-CM | POA: Diagnosis not present

## 2020-02-23 DIAGNOSIS — M1990 Unspecified osteoarthritis, unspecified site: Secondary | ICD-10-CM | POA: Diagnosis not present

## 2020-02-23 DIAGNOSIS — W19XXXD Unspecified fall, subsequent encounter: Secondary | ICD-10-CM | POA: Diagnosis not present

## 2020-02-23 DIAGNOSIS — M80021A Age-related osteoporosis with current pathological fracture, right humerus, initial encounter for fracture: Secondary | ICD-10-CM | POA: Diagnosis not present

## 2020-02-23 DIAGNOSIS — L409 Psoriasis, unspecified: Secondary | ICD-10-CM | POA: Diagnosis not present

## 2020-02-23 DIAGNOSIS — Z9181 History of falling: Secondary | ICD-10-CM | POA: Diagnosis not present

## 2020-02-25 DIAGNOSIS — L409 Psoriasis, unspecified: Secondary | ICD-10-CM | POA: Diagnosis not present

## 2020-02-25 DIAGNOSIS — Z9181 History of falling: Secondary | ICD-10-CM | POA: Diagnosis not present

## 2020-02-25 DIAGNOSIS — M80021A Age-related osteoporosis with current pathological fracture, right humerus, initial encounter for fracture: Secondary | ICD-10-CM | POA: Diagnosis not present

## 2020-02-25 DIAGNOSIS — W19XXXD Unspecified fall, subsequent encounter: Secondary | ICD-10-CM | POA: Diagnosis not present

## 2020-02-25 DIAGNOSIS — M1990 Unspecified osteoarthritis, unspecified site: Secondary | ICD-10-CM | POA: Diagnosis not present

## 2020-03-01 DIAGNOSIS — L409 Psoriasis, unspecified: Secondary | ICD-10-CM | POA: Diagnosis not present

## 2020-03-01 DIAGNOSIS — W19XXXD Unspecified fall, subsequent encounter: Secondary | ICD-10-CM | POA: Diagnosis not present

## 2020-03-01 DIAGNOSIS — Z9181 History of falling: Secondary | ICD-10-CM | POA: Diagnosis not present

## 2020-03-01 DIAGNOSIS — M1990 Unspecified osteoarthritis, unspecified site: Secondary | ICD-10-CM | POA: Diagnosis not present

## 2020-03-01 DIAGNOSIS — M80021A Age-related osteoporosis with current pathological fracture, right humerus, initial encounter for fracture: Secondary | ICD-10-CM | POA: Diagnosis not present

## 2020-03-02 ENCOUNTER — Ambulatory Visit (INDEPENDENT_AMBULATORY_CARE_PROVIDER_SITE_OTHER): Payer: Medicare PPO | Admitting: Psychology

## 2020-03-02 DIAGNOSIS — L409 Psoriasis, unspecified: Secondary | ICD-10-CM | POA: Diagnosis not present

## 2020-03-02 DIAGNOSIS — M80021A Age-related osteoporosis with current pathological fracture, right humerus, initial encounter for fracture: Secondary | ICD-10-CM | POA: Diagnosis not present

## 2020-03-02 DIAGNOSIS — F4323 Adjustment disorder with mixed anxiety and depressed mood: Secondary | ICD-10-CM

## 2020-03-02 DIAGNOSIS — M1990 Unspecified osteoarthritis, unspecified site: Secondary | ICD-10-CM | POA: Diagnosis not present

## 2020-03-02 DIAGNOSIS — W19XXXD Unspecified fall, subsequent encounter: Secondary | ICD-10-CM | POA: Diagnosis not present

## 2020-03-02 DIAGNOSIS — Z9181 History of falling: Secondary | ICD-10-CM | POA: Diagnosis not present

## 2020-03-03 DIAGNOSIS — S42291D Other displaced fracture of upper end of right humerus, subsequent encounter for fracture with routine healing: Secondary | ICD-10-CM | POA: Diagnosis not present

## 2020-03-04 DIAGNOSIS — M80021A Age-related osteoporosis with current pathological fracture, right humerus, initial encounter for fracture: Secondary | ICD-10-CM | POA: Diagnosis not present

## 2020-03-04 DIAGNOSIS — W19XXXD Unspecified fall, subsequent encounter: Secondary | ICD-10-CM | POA: Diagnosis not present

## 2020-03-04 DIAGNOSIS — M1990 Unspecified osteoarthritis, unspecified site: Secondary | ICD-10-CM | POA: Diagnosis not present

## 2020-03-04 DIAGNOSIS — L409 Psoriasis, unspecified: Secondary | ICD-10-CM | POA: Diagnosis not present

## 2020-03-04 DIAGNOSIS — Z9181 History of falling: Secondary | ICD-10-CM | POA: Diagnosis not present

## 2020-03-09 DIAGNOSIS — M1990 Unspecified osteoarthritis, unspecified site: Secondary | ICD-10-CM | POA: Diagnosis not present

## 2020-03-09 DIAGNOSIS — M80021A Age-related osteoporosis with current pathological fracture, right humerus, initial encounter for fracture: Secondary | ICD-10-CM | POA: Diagnosis not present

## 2020-03-09 DIAGNOSIS — L409 Psoriasis, unspecified: Secondary | ICD-10-CM | POA: Diagnosis not present

## 2020-03-09 DIAGNOSIS — Z9181 History of falling: Secondary | ICD-10-CM | POA: Diagnosis not present

## 2020-03-09 DIAGNOSIS — W19XXXD Unspecified fall, subsequent encounter: Secondary | ICD-10-CM | POA: Diagnosis not present

## 2020-03-16 ENCOUNTER — Ambulatory Visit (INDEPENDENT_AMBULATORY_CARE_PROVIDER_SITE_OTHER): Payer: Medicare PPO | Admitting: Psychology

## 2020-03-16 DIAGNOSIS — F4323 Adjustment disorder with mixed anxiety and depressed mood: Secondary | ICD-10-CM | POA: Diagnosis not present

## 2020-03-17 DIAGNOSIS — M80021A Age-related osteoporosis with current pathological fracture, right humerus, initial encounter for fracture: Secondary | ICD-10-CM | POA: Diagnosis not present

## 2020-03-17 DIAGNOSIS — Z9181 History of falling: Secondary | ICD-10-CM | POA: Diagnosis not present

## 2020-03-17 DIAGNOSIS — L409 Psoriasis, unspecified: Secondary | ICD-10-CM | POA: Diagnosis not present

## 2020-03-17 DIAGNOSIS — W19XXXD Unspecified fall, subsequent encounter: Secondary | ICD-10-CM | POA: Diagnosis not present

## 2020-03-17 DIAGNOSIS — M1990 Unspecified osteoarthritis, unspecified site: Secondary | ICD-10-CM | POA: Diagnosis not present

## 2020-03-19 DIAGNOSIS — M1990 Unspecified osteoarthritis, unspecified site: Secondary | ICD-10-CM | POA: Diagnosis not present

## 2020-03-19 DIAGNOSIS — M80021A Age-related osteoporosis with current pathological fracture, right humerus, initial encounter for fracture: Secondary | ICD-10-CM | POA: Diagnosis not present

## 2020-03-19 DIAGNOSIS — L409 Psoriasis, unspecified: Secondary | ICD-10-CM | POA: Diagnosis not present

## 2020-03-19 DIAGNOSIS — W19XXXD Unspecified fall, subsequent encounter: Secondary | ICD-10-CM | POA: Diagnosis not present

## 2020-03-19 DIAGNOSIS — Z9181 History of falling: Secondary | ICD-10-CM | POA: Diagnosis not present

## 2020-03-22 DIAGNOSIS — M80021A Age-related osteoporosis with current pathological fracture, right humerus, initial encounter for fracture: Secondary | ICD-10-CM | POA: Diagnosis not present

## 2020-03-22 DIAGNOSIS — M1990 Unspecified osteoarthritis, unspecified site: Secondary | ICD-10-CM | POA: Diagnosis not present

## 2020-03-22 DIAGNOSIS — L409 Psoriasis, unspecified: Secondary | ICD-10-CM | POA: Diagnosis not present

## 2020-03-22 DIAGNOSIS — W19XXXD Unspecified fall, subsequent encounter: Secondary | ICD-10-CM | POA: Diagnosis not present

## 2020-03-22 DIAGNOSIS — Z9181 History of falling: Secondary | ICD-10-CM | POA: Diagnosis not present

## 2020-03-24 DIAGNOSIS — W19XXXD Unspecified fall, subsequent encounter: Secondary | ICD-10-CM | POA: Diagnosis not present

## 2020-03-24 DIAGNOSIS — M1990 Unspecified osteoarthritis, unspecified site: Secondary | ICD-10-CM | POA: Diagnosis not present

## 2020-03-24 DIAGNOSIS — L409 Psoriasis, unspecified: Secondary | ICD-10-CM | POA: Diagnosis not present

## 2020-03-24 DIAGNOSIS — M80021A Age-related osteoporosis with current pathological fracture, right humerus, initial encounter for fracture: Secondary | ICD-10-CM | POA: Diagnosis not present

## 2020-03-24 DIAGNOSIS — Z9181 History of falling: Secondary | ICD-10-CM | POA: Diagnosis not present

## 2020-03-25 DIAGNOSIS — K649 Unspecified hemorrhoids: Secondary | ICD-10-CM | POA: Diagnosis not present

## 2020-03-25 DIAGNOSIS — B360 Pityriasis versicolor: Secondary | ICD-10-CM | POA: Diagnosis not present

## 2020-03-25 DIAGNOSIS — M222X1 Patellofemoral disorders, right knee: Secondary | ICD-10-CM | POA: Diagnosis not present

## 2020-03-25 DIAGNOSIS — Z681 Body mass index (BMI) 19 or less, adult: Secondary | ICD-10-CM | POA: Diagnosis not present

## 2020-03-25 DIAGNOSIS — M8000XD Age-related osteoporosis with current pathological fracture, unspecified site, subsequent encounter for fracture with routine healing: Secondary | ICD-10-CM | POA: Diagnosis not present

## 2020-03-30 ENCOUNTER — Ambulatory Visit (INDEPENDENT_AMBULATORY_CARE_PROVIDER_SITE_OTHER): Payer: Medicare PPO | Admitting: Psychology

## 2020-03-30 DIAGNOSIS — F4323 Adjustment disorder with mixed anxiety and depressed mood: Secondary | ICD-10-CM

## 2020-04-01 DIAGNOSIS — W19XXXD Unspecified fall, subsequent encounter: Secondary | ICD-10-CM | POA: Diagnosis not present

## 2020-04-01 DIAGNOSIS — L409 Psoriasis, unspecified: Secondary | ICD-10-CM | POA: Diagnosis not present

## 2020-04-01 DIAGNOSIS — M1990 Unspecified osteoarthritis, unspecified site: Secondary | ICD-10-CM | POA: Diagnosis not present

## 2020-04-01 DIAGNOSIS — Z9181 History of falling: Secondary | ICD-10-CM | POA: Diagnosis not present

## 2020-04-01 DIAGNOSIS — M80021A Age-related osteoporosis with current pathological fracture, right humerus, initial encounter for fracture: Secondary | ICD-10-CM | POA: Diagnosis not present

## 2020-04-02 DIAGNOSIS — W19XXXD Unspecified fall, subsequent encounter: Secondary | ICD-10-CM | POA: Diagnosis not present

## 2020-04-02 DIAGNOSIS — Z9181 History of falling: Secondary | ICD-10-CM | POA: Diagnosis not present

## 2020-04-02 DIAGNOSIS — M1990 Unspecified osteoarthritis, unspecified site: Secondary | ICD-10-CM | POA: Diagnosis not present

## 2020-04-02 DIAGNOSIS — L409 Psoriasis, unspecified: Secondary | ICD-10-CM | POA: Diagnosis not present

## 2020-04-02 DIAGNOSIS — M80021A Age-related osteoporosis with current pathological fracture, right humerus, initial encounter for fracture: Secondary | ICD-10-CM | POA: Diagnosis not present

## 2020-04-05 DIAGNOSIS — M1990 Unspecified osteoarthritis, unspecified site: Secondary | ICD-10-CM | POA: Diagnosis not present

## 2020-04-05 DIAGNOSIS — Z9181 History of falling: Secondary | ICD-10-CM | POA: Diagnosis not present

## 2020-04-05 DIAGNOSIS — L409 Psoriasis, unspecified: Secondary | ICD-10-CM | POA: Diagnosis not present

## 2020-04-05 DIAGNOSIS — M80021A Age-related osteoporosis with current pathological fracture, right humerus, initial encounter for fracture: Secondary | ICD-10-CM | POA: Diagnosis not present

## 2020-04-05 DIAGNOSIS — W19XXXD Unspecified fall, subsequent encounter: Secondary | ICD-10-CM | POA: Diagnosis not present

## 2020-04-06 DIAGNOSIS — Z9181 History of falling: Secondary | ICD-10-CM | POA: Diagnosis not present

## 2020-04-06 DIAGNOSIS — L409 Psoriasis, unspecified: Secondary | ICD-10-CM | POA: Diagnosis not present

## 2020-04-06 DIAGNOSIS — M80021A Age-related osteoporosis with current pathological fracture, right humerus, initial encounter for fracture: Secondary | ICD-10-CM | POA: Diagnosis not present

## 2020-04-06 DIAGNOSIS — M1990 Unspecified osteoarthritis, unspecified site: Secondary | ICD-10-CM | POA: Diagnosis not present

## 2020-04-06 DIAGNOSIS — W19XXXD Unspecified fall, subsequent encounter: Secondary | ICD-10-CM | POA: Diagnosis not present

## 2020-04-13 ENCOUNTER — Ambulatory Visit: Payer: Medicare PPO | Admitting: Psychology

## 2020-04-14 DIAGNOSIS — S42291D Other displaced fracture of upper end of right humerus, subsequent encounter for fracture with routine healing: Secondary | ICD-10-CM | POA: Diagnosis not present

## 2020-04-14 DIAGNOSIS — M545 Low back pain: Secondary | ICD-10-CM | POA: Diagnosis not present

## 2020-04-15 ENCOUNTER — Ambulatory Visit (INDEPENDENT_AMBULATORY_CARE_PROVIDER_SITE_OTHER): Payer: Medicare PPO | Admitting: Psychology

## 2020-04-15 DIAGNOSIS — F4323 Adjustment disorder with mixed anxiety and depressed mood: Secondary | ICD-10-CM

## 2020-04-22 DIAGNOSIS — Z20822 Contact with and (suspected) exposure to covid-19: Secondary | ICD-10-CM | POA: Diagnosis not present

## 2020-04-27 DIAGNOSIS — M79601 Pain in right arm: Secondary | ICD-10-CM | POA: Diagnosis not present

## 2020-04-28 DIAGNOSIS — Z23 Encounter for immunization: Secondary | ICD-10-CM | POA: Diagnosis not present

## 2020-05-03 ENCOUNTER — Ambulatory Visit (INDEPENDENT_AMBULATORY_CARE_PROVIDER_SITE_OTHER): Payer: Medicare PPO | Admitting: Psychology

## 2020-05-03 DIAGNOSIS — F4323 Adjustment disorder with mixed anxiety and depressed mood: Secondary | ICD-10-CM | POA: Diagnosis not present

## 2020-05-11 ENCOUNTER — Ambulatory Visit (INDEPENDENT_AMBULATORY_CARE_PROVIDER_SITE_OTHER): Payer: Medicare PPO | Admitting: Psychology

## 2020-05-11 DIAGNOSIS — F4323 Adjustment disorder with mixed anxiety and depressed mood: Secondary | ICD-10-CM

## 2020-05-25 ENCOUNTER — Ambulatory Visit (INDEPENDENT_AMBULATORY_CARE_PROVIDER_SITE_OTHER): Payer: Medicare PPO | Admitting: Psychology

## 2020-05-25 DIAGNOSIS — F4323 Adjustment disorder with mixed anxiety and depressed mood: Secondary | ICD-10-CM | POA: Diagnosis not present

## 2020-06-02 ENCOUNTER — Ambulatory Visit (INDEPENDENT_AMBULATORY_CARE_PROVIDER_SITE_OTHER): Payer: Medicare PPO | Admitting: Psychology

## 2020-06-02 DIAGNOSIS — F4323 Adjustment disorder with mixed anxiety and depressed mood: Secondary | ICD-10-CM | POA: Diagnosis not present

## 2020-06-08 ENCOUNTER — Ambulatory Visit (INDEPENDENT_AMBULATORY_CARE_PROVIDER_SITE_OTHER): Payer: Medicare PPO | Admitting: Psychology

## 2020-06-08 DIAGNOSIS — F4323 Adjustment disorder with mixed anxiety and depressed mood: Secondary | ICD-10-CM

## 2020-06-22 ENCOUNTER — Ambulatory Visit (INDEPENDENT_AMBULATORY_CARE_PROVIDER_SITE_OTHER): Payer: Medicare PPO | Admitting: Psychology

## 2020-06-22 DIAGNOSIS — F4323 Adjustment disorder with mixed anxiety and depressed mood: Secondary | ICD-10-CM | POA: Diagnosis not present

## 2020-07-06 ENCOUNTER — Ambulatory Visit (INDEPENDENT_AMBULATORY_CARE_PROVIDER_SITE_OTHER): Payer: Medicare PPO | Admitting: Psychology

## 2020-07-06 DIAGNOSIS — F4323 Adjustment disorder with mixed anxiety and depressed mood: Secondary | ICD-10-CM

## 2020-07-14 ENCOUNTER — Encounter: Payer: Self-pay | Admitting: Radiology

## 2020-07-20 ENCOUNTER — Ambulatory Visit: Payer: Medicare PPO | Admitting: Psychology

## 2020-07-27 ENCOUNTER — Ambulatory Visit (INDEPENDENT_AMBULATORY_CARE_PROVIDER_SITE_OTHER): Payer: Medicare PPO | Admitting: Psychology

## 2020-07-27 DIAGNOSIS — F4323 Adjustment disorder with mixed anxiety and depressed mood: Secondary | ICD-10-CM

## 2020-08-03 ENCOUNTER — Ambulatory Visit (INDEPENDENT_AMBULATORY_CARE_PROVIDER_SITE_OTHER): Payer: Medicare PPO | Admitting: Psychology

## 2020-08-03 DIAGNOSIS — F4323 Adjustment disorder with mixed anxiety and depressed mood: Secondary | ICD-10-CM | POA: Diagnosis not present

## 2020-08-17 ENCOUNTER — Ambulatory Visit (INDEPENDENT_AMBULATORY_CARE_PROVIDER_SITE_OTHER): Payer: Medicare PPO | Admitting: Psychology

## 2020-08-17 DIAGNOSIS — F4323 Adjustment disorder with mixed anxiety and depressed mood: Secondary | ICD-10-CM | POA: Diagnosis not present

## 2020-08-31 ENCOUNTER — Ambulatory Visit (INDEPENDENT_AMBULATORY_CARE_PROVIDER_SITE_OTHER): Payer: Medicare PPO | Admitting: Psychology

## 2020-08-31 DIAGNOSIS — F4323 Adjustment disorder with mixed anxiety and depressed mood: Secondary | ICD-10-CM | POA: Diagnosis not present

## 2020-09-14 ENCOUNTER — Ambulatory Visit (INDEPENDENT_AMBULATORY_CARE_PROVIDER_SITE_OTHER): Payer: Medicare PPO | Admitting: Psychology

## 2020-09-14 DIAGNOSIS — F4323 Adjustment disorder with mixed anxiety and depressed mood: Secondary | ICD-10-CM

## 2020-09-28 ENCOUNTER — Ambulatory Visit: Payer: Medicare PPO | Admitting: Psychology

## 2020-09-28 ENCOUNTER — Ambulatory Visit (INDEPENDENT_AMBULATORY_CARE_PROVIDER_SITE_OTHER): Payer: Medicare PPO | Admitting: Psychology

## 2020-09-28 DIAGNOSIS — F4323 Adjustment disorder with mixed anxiety and depressed mood: Secondary | ICD-10-CM | POA: Diagnosis not present

## 2020-10-12 ENCOUNTER — Ambulatory Visit: Payer: Medicare PPO | Admitting: Psychology

## 2020-10-12 ENCOUNTER — Ambulatory Visit (INDEPENDENT_AMBULATORY_CARE_PROVIDER_SITE_OTHER): Payer: Medicare PPO | Admitting: Psychology

## 2020-10-12 DIAGNOSIS — F4323 Adjustment disorder with mixed anxiety and depressed mood: Secondary | ICD-10-CM | POA: Diagnosis not present

## 2020-10-26 ENCOUNTER — Ambulatory Visit: Payer: Medicare PPO | Admitting: Psychology

## 2020-11-09 ENCOUNTER — Ambulatory Visit: Payer: Medicare PPO | Admitting: Psychology

## 2020-11-23 ENCOUNTER — Ambulatory Visit: Payer: Medicare PPO | Admitting: Psychology

## 2020-12-07 ENCOUNTER — Ambulatory Visit: Payer: Medicare PPO | Admitting: Psychology

## 2020-12-21 ENCOUNTER — Ambulatory Visit: Payer: Medicare PPO | Admitting: Psychology

## 2021-01-04 ENCOUNTER — Ambulatory Visit: Payer: Medicare PPO | Admitting: Psychology

## 2021-01-18 ENCOUNTER — Ambulatory Visit: Payer: Medicare PPO | Admitting: Psychology

## 2021-02-01 ENCOUNTER — Ambulatory Visit: Payer: Medicare PPO | Admitting: Psychology

## 2021-02-15 ENCOUNTER — Ambulatory Visit: Payer: Medicare PPO | Admitting: Psychology

## 2021-02-21 ENCOUNTER — Encounter: Payer: Self-pay | Admitting: Radiology

## 2021-03-01 ENCOUNTER — Ambulatory Visit: Payer: Medicare PPO | Admitting: Psychology

## 2021-03-15 ENCOUNTER — Ambulatory Visit: Payer: Medicare PPO | Admitting: Psychology

## 2021-03-29 ENCOUNTER — Ambulatory Visit: Payer: Medicare PPO | Admitting: Psychology

## 2021-04-12 ENCOUNTER — Ambulatory Visit: Payer: Medicare PPO | Admitting: Psychology
